# Patient Record
Sex: Female | Born: 1945 | Race: White | Hispanic: No | Smoking: Never smoker
Health system: Southern US, Community
[De-identification: ages and names within clinical notes are randomized; demographics above are authoritative.]

## PROBLEM LIST (undated history)

## (undated) DIAGNOSIS — Z8719 Personal history of other diseases of the digestive system: Secondary | ICD-10-CM

## (undated) DIAGNOSIS — M1990 Unspecified osteoarthritis, unspecified site: Secondary | ICD-10-CM

## (undated) DIAGNOSIS — I7300 Raynaud's syndrome without gangrene: Secondary | ICD-10-CM

## (undated) DIAGNOSIS — Z803 Family history of malignant neoplasm of breast: Secondary | ICD-10-CM

## (undated) DIAGNOSIS — Z8041 Family history of malignant neoplasm of ovary: Secondary | ICD-10-CM

## (undated) DIAGNOSIS — B0229 Other postherpetic nervous system involvement: Secondary | ICD-10-CM

## (undated) DIAGNOSIS — M7989 Other specified soft tissue disorders: Secondary | ICD-10-CM

## (undated) DIAGNOSIS — E039 Hypothyroidism, unspecified: Secondary | ICD-10-CM

## (undated) DIAGNOSIS — M3500 Sicca syndrome, unspecified: Secondary | ICD-10-CM

## (undated) DIAGNOSIS — B029 Zoster without complications: Secondary | ICD-10-CM

## (undated) DIAGNOSIS — I714 Abdominal aortic aneurysm, without rupture, unspecified: Secondary | ICD-10-CM

## (undated) DIAGNOSIS — H269 Unspecified cataract: Secondary | ICD-10-CM

## (undated) DIAGNOSIS — K219 Gastro-esophageal reflux disease without esophagitis: Secondary | ICD-10-CM

## (undated) DIAGNOSIS — G43909 Migraine, unspecified, not intractable, without status migrainosus: Secondary | ICD-10-CM

## (undated) DIAGNOSIS — R1310 Dysphagia, unspecified: Secondary | ICD-10-CM

## (undated) DIAGNOSIS — M810 Age-related osteoporosis without current pathological fracture: Secondary | ICD-10-CM

## (undated) DIAGNOSIS — T884XXA Failed or difficult intubation, initial encounter: Secondary | ICD-10-CM

## (undated) DIAGNOSIS — K449 Diaphragmatic hernia without obstruction or gangrene: Secondary | ICD-10-CM

## (undated) DIAGNOSIS — I10 Essential (primary) hypertension: Secondary | ICD-10-CM

## (undated) DIAGNOSIS — Z8489 Family history of other specified conditions: Secondary | ICD-10-CM

## (undated) DIAGNOSIS — Z801 Family history of malignant neoplasm of trachea, bronchus and lung: Secondary | ICD-10-CM

## (undated) DIAGNOSIS — E042 Nontoxic multinodular goiter: Secondary | ICD-10-CM

## (undated) DIAGNOSIS — M545 Low back pain, unspecified: Secondary | ICD-10-CM

## (undated) DIAGNOSIS — K859 Acute pancreatitis without necrosis or infection, unspecified: Secondary | ICD-10-CM

## (undated) DIAGNOSIS — D62 Acute posthemorrhagic anemia: Secondary | ICD-10-CM

## (undated) DIAGNOSIS — K2970 Gastritis, unspecified, without bleeding: Secondary | ICD-10-CM

## (undated) DIAGNOSIS — D509 Iron deficiency anemia, unspecified: Secondary | ICD-10-CM

## (undated) DIAGNOSIS — G8929 Other chronic pain: Secondary | ICD-10-CM

## (undated) DIAGNOSIS — M3320 Polymyositis, organ involvement unspecified: Secondary | ICD-10-CM

## (undated) DIAGNOSIS — E7800 Pure hypercholesterolemia, unspecified: Secondary | ICD-10-CM

## (undated) DIAGNOSIS — M25473 Effusion, unspecified ankle: Secondary | ICD-10-CM

## (undated) DIAGNOSIS — K5790 Diverticulosis of intestine, part unspecified, without perforation or abscess without bleeding: Secondary | ICD-10-CM

## (undated) DIAGNOSIS — I011 Acute rheumatic endocarditis: Secondary | ICD-10-CM

## (undated) DIAGNOSIS — G7241 Inclusion body myositis [IBM]: Secondary | ICD-10-CM

## (undated) HISTORY — DX: Essential (primary) hypertension: I10

## (undated) HISTORY — DX: Diaphragmatic hernia without obstruction or gangrene: K44.9

## (undated) HISTORY — DX: Unspecified cataract: H26.9

## (undated) HISTORY — DX: Raynaud's syndrome without gangrene: I73.00

## (undated) HISTORY — DX: Sjogren syndrome, unspecified: M35.00

## (undated) HISTORY — PX: ESOPHAGOGASTRODUODENOSCOPY: SHX1529

## (undated) HISTORY — DX: Family history of malignant neoplasm of breast: Z80.3

## (undated) HISTORY — DX: Age-related osteoporosis without current pathological fracture: M81.0

## (undated) HISTORY — PX: ESOPHAGEAL DILATION: SHX303

## (undated) HISTORY — DX: Family history of malignant neoplasm of ovary: Z80.41

## (undated) HISTORY — DX: Diverticulosis of intestine, part unspecified, without perforation or abscess without bleeding: K57.90

## (undated) HISTORY — DX: Unspecified osteoarthritis, unspecified site: M19.90

## (undated) HISTORY — PX: CATARACT EXTRACTION W/ INTRAOCULAR LENS  IMPLANT, BILATERAL: SHX1307

## (undated) HISTORY — DX: Abdominal aortic aneurysm, without rupture: I71.4

## (undated) HISTORY — DX: Family history of malignant neoplasm of trachea, bronchus and lung: Z80.1

## (undated) HISTORY — DX: Zoster without complications: B02.9

## (undated) HISTORY — DX: Gastritis, unspecified, without bleeding: K29.70

## (undated) HISTORY — DX: Other specified soft tissue disorders: M79.89

## (undated) HISTORY — DX: Acute posthemorrhagic anemia: D62

## (undated) HISTORY — DX: Abdominal aortic aneurysm, without rupture, unspecified: I71.40

## (undated) HISTORY — DX: Gastro-esophageal reflux disease without esophagitis: K21.9

## (undated) HISTORY — DX: Polymyositis, organ involvement unspecified: M33.20

## (undated) HISTORY — DX: Iron deficiency anemia, unspecified: D50.9

## (undated) HISTORY — PX: DENTAL SURGERY: SHX609

---

## 1998-06-14 ENCOUNTER — Other Ambulatory Visit: Admission: RE | Admit: 1998-06-14 | Discharge: 1998-06-14 | Payer: Self-pay | Admitting: *Deleted

## 1999-06-03 ENCOUNTER — Other Ambulatory Visit: Admission: RE | Admit: 1999-06-03 | Discharge: 1999-06-03 | Payer: Self-pay | Admitting: *Deleted

## 1999-08-19 ENCOUNTER — Other Ambulatory Visit: Admission: RE | Admit: 1999-08-19 | Discharge: 1999-08-19 | Payer: Self-pay | Admitting: General Surgery

## 1999-10-18 ENCOUNTER — Encounter: Payer: Self-pay | Admitting: *Deleted

## 1999-10-18 ENCOUNTER — Encounter: Admission: RE | Admit: 1999-10-18 | Discharge: 1999-10-18 | Payer: Self-pay | Admitting: *Deleted

## 1999-10-31 ENCOUNTER — Encounter: Admission: RE | Admit: 1999-10-31 | Discharge: 1999-10-31 | Payer: Self-pay | Admitting: *Deleted

## 1999-10-31 ENCOUNTER — Encounter: Payer: Self-pay | Admitting: *Deleted

## 2000-07-10 ENCOUNTER — Other Ambulatory Visit: Admission: RE | Admit: 2000-07-10 | Discharge: 2000-07-10 | Payer: Self-pay | Admitting: *Deleted

## 2000-10-31 ENCOUNTER — Encounter: Payer: Self-pay | Admitting: *Deleted

## 2000-10-31 ENCOUNTER — Encounter: Admission: RE | Admit: 2000-10-31 | Discharge: 2000-10-31 | Payer: Self-pay | Admitting: *Deleted

## 2001-08-06 ENCOUNTER — Other Ambulatory Visit: Admission: RE | Admit: 2001-08-06 | Discharge: 2001-08-06 | Payer: Self-pay | Admitting: *Deleted

## 2001-09-20 ENCOUNTER — Ambulatory Visit (HOSPITAL_COMMUNITY): Admission: RE | Admit: 2001-09-20 | Discharge: 2001-09-20 | Payer: Self-pay | Admitting: *Deleted

## 2001-11-01 ENCOUNTER — Encounter: Admission: RE | Admit: 2001-11-01 | Discharge: 2001-11-01 | Payer: Self-pay | Admitting: *Deleted

## 2001-11-01 ENCOUNTER — Encounter: Payer: Self-pay | Admitting: *Deleted

## 2001-11-20 ENCOUNTER — Encounter (INDEPENDENT_AMBULATORY_CARE_PROVIDER_SITE_OTHER): Payer: Self-pay | Admitting: *Deleted

## 2002-11-03 ENCOUNTER — Encounter: Payer: Self-pay | Admitting: *Deleted

## 2002-11-03 ENCOUNTER — Encounter: Admission: RE | Admit: 2002-11-03 | Discharge: 2002-11-03 | Payer: Self-pay | Admitting: *Deleted

## 2002-11-25 ENCOUNTER — Other Ambulatory Visit: Admission: RE | Admit: 2002-11-25 | Discharge: 2002-11-25 | Payer: Self-pay | Admitting: *Deleted

## 2003-05-07 ENCOUNTER — Other Ambulatory Visit: Admission: RE | Admit: 2003-05-07 | Discharge: 2003-05-07 | Payer: Self-pay | Admitting: *Deleted

## 2003-12-04 ENCOUNTER — Encounter: Admission: RE | Admit: 2003-12-04 | Discharge: 2003-12-04 | Payer: Self-pay | Admitting: Obstetrics

## 2004-03-24 ENCOUNTER — Encounter: Admission: RE | Admit: 2004-03-24 | Discharge: 2004-03-24 | Payer: Self-pay | Admitting: Internal Medicine

## 2004-06-13 ENCOUNTER — Other Ambulatory Visit: Admission: RE | Admit: 2004-06-13 | Discharge: 2004-06-13 | Payer: Self-pay | Admitting: Obstetrics and Gynecology

## 2005-10-10 ENCOUNTER — Other Ambulatory Visit: Admission: RE | Admit: 2005-10-10 | Discharge: 2005-10-10 | Payer: Self-pay | Admitting: Obstetrics and Gynecology

## 2005-10-20 ENCOUNTER — Encounter: Admission: RE | Admit: 2005-10-20 | Discharge: 2005-10-20 | Payer: Self-pay | Admitting: Obstetrics and Gynecology

## 2006-10-31 ENCOUNTER — Encounter: Admission: RE | Admit: 2006-10-31 | Discharge: 2006-10-31 | Payer: Self-pay | Admitting: Obstetrics and Gynecology

## 2006-11-05 ENCOUNTER — Other Ambulatory Visit: Admission: RE | Admit: 2006-11-05 | Discharge: 2006-11-05 | Payer: Self-pay | Admitting: Obstetrics and Gynecology

## 2006-12-14 ENCOUNTER — Ambulatory Visit (HOSPITAL_COMMUNITY): Admission: RE | Admit: 2006-12-14 | Discharge: 2006-12-14 | Payer: Self-pay | Admitting: *Deleted

## 2006-12-14 ENCOUNTER — Encounter (INDEPENDENT_AMBULATORY_CARE_PROVIDER_SITE_OTHER): Payer: Self-pay | Admitting: *Deleted

## 2007-02-17 ENCOUNTER — Encounter: Payer: Self-pay | Admitting: Gastroenterology

## 2007-02-27 ENCOUNTER — Encounter: Admission: RE | Admit: 2007-02-27 | Discharge: 2007-02-27 | Payer: Self-pay | Admitting: Internal Medicine

## 2007-03-21 ENCOUNTER — Encounter: Admission: RE | Admit: 2007-03-21 | Discharge: 2007-03-21 | Payer: Self-pay | Admitting: *Deleted

## 2007-11-08 ENCOUNTER — Other Ambulatory Visit: Admission: RE | Admit: 2007-11-08 | Discharge: 2007-11-08 | Payer: Self-pay | Admitting: Obstetrics and Gynecology

## 2007-11-27 ENCOUNTER — Encounter: Admission: RE | Admit: 2007-11-27 | Discharge: 2007-11-27 | Payer: Self-pay | Admitting: Obstetrics and Gynecology

## 2008-04-21 ENCOUNTER — Encounter (INDEPENDENT_AMBULATORY_CARE_PROVIDER_SITE_OTHER): Payer: Self-pay | Admitting: *Deleted

## 2008-04-21 ENCOUNTER — Ambulatory Visit (HOSPITAL_COMMUNITY): Admission: RE | Admit: 2008-04-21 | Discharge: 2008-04-21 | Payer: Self-pay | Admitting: *Deleted

## 2008-11-10 ENCOUNTER — Ambulatory Visit: Payer: Self-pay | Admitting: Obstetrics and Gynecology

## 2008-11-30 ENCOUNTER — Encounter: Admission: RE | Admit: 2008-11-30 | Discharge: 2008-11-30 | Payer: Self-pay | Admitting: Obstetrics and Gynecology

## 2009-02-11 ENCOUNTER — Encounter (INDEPENDENT_AMBULATORY_CARE_PROVIDER_SITE_OTHER): Payer: Self-pay | Admitting: *Deleted

## 2009-02-11 ENCOUNTER — Ambulatory Visit (HOSPITAL_COMMUNITY): Admission: RE | Admit: 2009-02-11 | Discharge: 2009-02-11 | Payer: Self-pay | Admitting: *Deleted

## 2009-09-02 ENCOUNTER — Ambulatory Visit (HOSPITAL_COMMUNITY): Admission: RE | Admit: 2009-09-02 | Discharge: 2009-09-02 | Payer: Self-pay | Admitting: Internal Medicine

## 2009-10-21 ENCOUNTER — Encounter: Payer: Self-pay | Admitting: Gastroenterology

## 2009-10-26 ENCOUNTER — Ambulatory Visit (HOSPITAL_COMMUNITY): Admission: RE | Admit: 2009-10-26 | Discharge: 2009-10-26 | Payer: Self-pay | Admitting: Endocrinology

## 2009-11-05 ENCOUNTER — Encounter: Payer: Self-pay | Admitting: Gastroenterology

## 2009-11-30 ENCOUNTER — Ambulatory Visit: Payer: Self-pay | Admitting: Oncology

## 2009-12-14 ENCOUNTER — Encounter (INDEPENDENT_AMBULATORY_CARE_PROVIDER_SITE_OTHER): Payer: Self-pay | Admitting: *Deleted

## 2009-12-16 LAB — MORPHOLOGY: PLT EST: ADEQUATE

## 2009-12-16 LAB — CBC & DIFF AND RETIC
BASO%: 1.1 % (ref 0.0–2.0)
EOS%: 7 % (ref 0.0–7.0)
LYMPH%: 25.4 % (ref 14.0–49.7)
MCHC: 32.1 g/dL (ref 31.5–36.0)
MCV: 79.1 fL — ABNORMAL LOW (ref 79.5–101.0)
MONO%: 13.3 % (ref 0.0–14.0)
Platelets: 291 10*3/uL (ref 145–400)
RBC: 3.78 10*6/uL (ref 3.70–5.45)
RDW: 15.9 % — ABNORMAL HIGH (ref 11.2–14.5)
Retic %: 0.61 % (ref 0.50–1.50)
nRBC: 0 % (ref 0–0)

## 2009-12-20 LAB — COMPREHENSIVE METABOLIC PANEL
ALT: 24 U/L (ref 0–35)
BUN: 14 mg/dL (ref 6–23)
CO2: 23 mEq/L (ref 19–32)
Calcium: 9.3 mg/dL (ref 8.4–10.5)
Chloride: 99 mEq/L (ref 96–112)
Creatinine, Ser: 0.72 mg/dL (ref 0.40–1.20)
Glucose, Bld: 84 mg/dL (ref 70–99)

## 2009-12-20 LAB — IRON AND TIBC
%SAT: 6 % — ABNORMAL LOW (ref 20–55)
Iron: 22 ug/dL — ABNORMAL LOW (ref 42–145)
TIBC: 377 ug/dL (ref 250–470)
UIBC: 355 ug/dL

## 2009-12-20 LAB — TRANSFERRIN RECEPTOR, SOLUABLE: Transferrin Receptor, Soluble: 31.4 nmol/L

## 2009-12-21 LAB — IMMUNOFIXATION ELECTROPHORESIS
IgA: 240 mg/dL (ref 68–378)
IgG (Immunoglobin G), Serum: 1100 mg/dL (ref 694–1618)
IgM, Serum: 65 mg/dL (ref 60–263)

## 2009-12-29 ENCOUNTER — Encounter: Payer: Self-pay | Admitting: Gastroenterology

## 2010-01-12 ENCOUNTER — Ambulatory Visit: Payer: Self-pay | Admitting: Oncology

## 2010-01-12 ENCOUNTER — Ambulatory Visit: Payer: Self-pay | Admitting: Gastroenterology

## 2010-01-12 DIAGNOSIS — R1310 Dysphagia, unspecified: Secondary | ICD-10-CM | POA: Insufficient documentation

## 2010-01-12 DIAGNOSIS — D509 Iron deficiency anemia, unspecified: Secondary | ICD-10-CM | POA: Insufficient documentation

## 2010-01-12 DIAGNOSIS — H402290 Chronic angle-closure glaucoma, unspecified eye, stage unspecified: Secondary | ICD-10-CM | POA: Insufficient documentation

## 2010-01-13 ENCOUNTER — Encounter: Payer: Self-pay | Admitting: Gastroenterology

## 2010-01-14 LAB — COMPREHENSIVE METABOLIC PANEL
ALT: 25 U/L (ref 0–35)
AST: 31 U/L (ref 0–37)
Albumin: 3.6 g/dL (ref 3.5–5.2)
Alkaline Phosphatase: 52 U/L (ref 39–117)
BUN: 10 mg/dL (ref 6–23)
CO2: 28 mEq/L (ref 19–32)
Calcium: 9.1 mg/dL (ref 8.4–10.5)
Chloride: 100 mEq/L (ref 96–112)
Creatinine, Ser: 0.53 mg/dL (ref 0.40–1.20)
Glucose, Bld: 77 mg/dL (ref 70–99)
Potassium: 3.7 mEq/L (ref 3.5–5.3)
Sodium: 133 mEq/L — ABNORMAL LOW (ref 135–145)
Total Bilirubin: 0.3 mg/dL (ref 0.3–1.2)
Total Protein: 6.5 g/dL (ref 6.0–8.3)

## 2010-01-14 LAB — CBC WITH DIFFERENTIAL/PLATELET
BASO%: 0.7 % (ref 0.0–2.0)
Basophils Absolute: 0 10*3/uL (ref 0.0–0.1)
EOS%: 3.3 % (ref 0.0–7.0)
Eosinophils Absolute: 0.1 10*3/uL (ref 0.0–0.5)
HCT: 31.5 % — ABNORMAL LOW (ref 34.8–46.6)
HGB: 10.4 g/dL — ABNORMAL LOW (ref 11.6–15.9)
LYMPH%: 33.1 % (ref 14.0–49.7)
MCH: 27.7 pg (ref 25.1–34.0)
MCHC: 33.1 g/dL (ref 31.5–36.0)
MCV: 83.7 fL (ref 79.5–101.0)
MONO#: 0.6 10*3/uL (ref 0.1–0.9)
MONO%: 14.6 % — ABNORMAL HIGH (ref 0.0–14.0)
NEUT#: 2 10*3/uL (ref 1.5–6.5)
NEUT%: 48.3 % (ref 38.4–76.8)
Platelets: 287 10*3/uL (ref 145–400)
RBC: 3.77 10*6/uL (ref 3.70–5.45)
RDW: 21.3 % — ABNORMAL HIGH (ref 11.2–14.5)
WBC: 4.2 10*3/uL (ref 3.9–10.3)
lymph#: 1.4 10*3/uL (ref 0.9–3.3)

## 2010-01-14 LAB — LACTATE DEHYDROGENASE: LDH: 184 U/L (ref 94–250)

## 2010-01-14 LAB — SEDIMENTATION RATE: Sed Rate: 10 mm/hr (ref 0–22)

## 2010-01-19 ENCOUNTER — Other Ambulatory Visit: Admission: RE | Admit: 2010-01-19 | Discharge: 2010-01-19 | Payer: Self-pay | Admitting: Obstetrics and Gynecology

## 2010-01-19 ENCOUNTER — Ambulatory Visit: Payer: Self-pay | Admitting: Obstetrics and Gynecology

## 2010-01-21 ENCOUNTER — Encounter: Admission: RE | Admit: 2010-01-21 | Discharge: 2010-01-21 | Payer: Self-pay | Admitting: Obstetrics and Gynecology

## 2010-01-24 ENCOUNTER — Encounter: Admission: RE | Admit: 2010-01-24 | Discharge: 2010-01-24 | Payer: Self-pay | Admitting: Otolaryngology

## 2010-02-15 HISTORY — PX: REPAIR ZENKER'S DIVERTICULA: SUR1212

## 2010-02-17 ENCOUNTER — Ambulatory Visit (HOSPITAL_COMMUNITY): Admission: RE | Admit: 2010-02-17 | Discharge: 2010-02-18 | Payer: Self-pay | Admitting: Otolaryngology

## 2010-03-21 ENCOUNTER — Ambulatory Visit: Payer: Self-pay | Admitting: Oncology

## 2010-03-23 LAB — CBC WITH DIFFERENTIAL/PLATELET
BASO%: 0.3 % (ref 0.0–2.0)
Basophils Absolute: 0 10*3/uL (ref 0.0–0.1)
EOS%: 0.5 % (ref 0.0–7.0)
Eosinophils Absolute: 0 10*3/uL (ref 0.0–0.5)
HCT: 35 % (ref 34.8–46.6)
HGB: 12 g/dL (ref 11.6–15.9)
LYMPH%: 22.7 % (ref 14.0–49.7)
MCH: 30.3 pg (ref 25.1–34.0)
MCHC: 34.4 g/dL (ref 31.5–36.0)
MCV: 88.1 fL (ref 79.5–101.0)
MONO#: 0.7 10*3/uL (ref 0.1–0.9)
MONO%: 8.2 % (ref 0.0–14.0)
NEUT#: 5.8 10*3/uL (ref 1.5–6.5)
NEUT%: 68.3 % (ref 38.4–76.8)
Platelets: 355 10*3/uL (ref 145–400)
RBC: 3.97 10*6/uL (ref 3.70–5.45)
RDW: 16.1 % — ABNORMAL HIGH (ref 11.2–14.5)
WBC: 8.4 10*3/uL (ref 3.9–10.3)
lymph#: 1.9 10*3/uL (ref 0.9–3.3)

## 2010-03-24 LAB — IRON AND TIBC
%SAT: 28 % (ref 20–55)
Iron: 88 ug/dL (ref 42–145)
TIBC: 310 ug/dL (ref 250–470)
UIBC: 222 ug/dL

## 2010-03-24 LAB — TRANSFERRIN RECEPTOR, SOLUABLE: Transferrin Receptor, Soluble: 13.9 nmol/L

## 2010-03-24 LAB — FERRITIN: Ferritin: 35 ng/mL (ref 10–291)

## 2010-04-26 ENCOUNTER — Encounter: Admission: RE | Admit: 2010-04-26 | Discharge: 2010-04-26 | Payer: Self-pay | Admitting: Otolaryngology

## 2010-05-06 ENCOUNTER — Ambulatory Visit (HOSPITAL_COMMUNITY): Admission: RE | Admit: 2010-05-06 | Discharge: 2010-05-06 | Payer: Self-pay | Admitting: Endocrinology

## 2010-06-22 ENCOUNTER — Ambulatory Visit: Payer: Self-pay | Admitting: Oncology

## 2010-06-24 LAB — CBC WITH DIFFERENTIAL/PLATELET
BASO%: 0.6 % (ref 0.0–2.0)
Basophils Absolute: 0 10*3/uL (ref 0.0–0.1)
EOS%: 3.6 % (ref 0.0–7.0)
Eosinophils Absolute: 0.1 10*3/uL (ref 0.0–0.5)
HCT: 34.5 % — ABNORMAL LOW (ref 34.8–46.6)
HGB: 12 g/dL (ref 11.6–15.9)
LYMPH%: 28.3 % (ref 14.0–49.7)
MCH: 32.5 pg (ref 25.1–34.0)
MCHC: 34.8 g/dL (ref 31.5–36.0)
MCV: 93.3 fL (ref 79.5–101.0)
MONO#: 0.6 10*3/uL (ref 0.1–0.9)
MONO%: 14.9 % — ABNORMAL HIGH (ref 0.0–14.0)
NEUT#: 2.1 10*3/uL (ref 1.5–6.5)
NEUT%: 52.6 % (ref 38.4–76.8)
Platelets: 292 10*3/uL (ref 145–400)
RBC: 3.69 10*6/uL — ABNORMAL LOW (ref 3.70–5.45)
RDW: 13.1 % (ref 11.2–14.5)
WBC: 4 10*3/uL (ref 3.9–10.3)
lymph#: 1.1 10*3/uL (ref 0.9–3.3)

## 2010-06-27 LAB — COMPREHENSIVE METABOLIC PANEL
ALT: 22 U/L (ref 0–35)
AST: 26 U/L (ref 0–37)
Albumin: 3.9 g/dL (ref 3.5–5.2)
Alkaline Phosphatase: 52 U/L (ref 39–117)
BUN: 13 mg/dL (ref 6–23)
CO2: 24 mEq/L (ref 19–32)
Calcium: 9.5 mg/dL (ref 8.4–10.5)
Chloride: 97 mEq/L (ref 96–112)
Creatinine, Ser: 0.55 mg/dL (ref 0.40–1.20)
Glucose, Bld: 71 mg/dL (ref 70–99)
Potassium: 4.7 mEq/L (ref 3.5–5.3)
Sodium: 130 mEq/L — ABNORMAL LOW (ref 135–145)
Total Bilirubin: 0.3 mg/dL (ref 0.3–1.2)
Total Protein: 6.6 g/dL (ref 6.0–8.3)

## 2010-06-27 LAB — IRON AND TIBC
%SAT: 24 % (ref 20–55)
Iron: 67 ug/dL (ref 42–145)
TIBC: 278 ug/dL (ref 250–470)
UIBC: 211 ug/dL

## 2010-06-27 LAB — LACTATE DEHYDROGENASE: LDH: 204 U/L (ref 94–250)

## 2010-06-27 LAB — FERRITIN: Ferritin: 48 ng/mL (ref 10–291)

## 2010-06-27 LAB — TRANSFERRIN RECEPTOR, SOLUABLE: Transferrin Receptor, Soluble: 14.5 nmol/L

## 2010-08-02 ENCOUNTER — Ambulatory Visit: Payer: Self-pay | Admitting: Vascular Surgery

## 2010-10-21 ENCOUNTER — Ambulatory Visit: Payer: Self-pay | Admitting: Oncology

## 2010-10-25 LAB — COMPREHENSIVE METABOLIC PANEL
ALT: 22 U/L (ref 0–35)
AST: 21 U/L (ref 0–37)
Albumin: 4.4 g/dL (ref 3.5–5.2)
Alkaline Phosphatase: 49 U/L (ref 39–117)
BUN: 12 mg/dL (ref 6–23)
CO2: 25 mEq/L (ref 19–32)
Calcium: 9.8 mg/dL (ref 8.4–10.5)
Chloride: 96 mEq/L (ref 96–112)
Creatinine, Ser: 0.59 mg/dL (ref 0.40–1.20)
Glucose, Bld: 83 mg/dL (ref 70–99)
Potassium: 4.3 mEq/L (ref 3.5–5.3)
Sodium: 133 mEq/L — ABNORMAL LOW (ref 135–145)
Total Bilirubin: 0.4 mg/dL (ref 0.3–1.2)
Total Protein: 6.7 g/dL (ref 6.0–8.3)

## 2010-10-25 LAB — CBC WITH DIFFERENTIAL/PLATELET
BASO%: 0.1 % (ref 0.0–2.0)
Basophils Absolute: 0 10*3/uL (ref 0.0–0.1)
EOS%: 0.9 % (ref 0.0–7.0)
Eosinophils Absolute: 0.1 10*3/uL (ref 0.0–0.5)
HCT: 37.2 % (ref 34.8–46.6)
HGB: 13 g/dL (ref 11.6–15.9)
LYMPH%: 19.7 % (ref 14.0–49.7)
MCH: 33.1 pg (ref 25.1–34.0)
MCHC: 34.9 g/dL (ref 31.5–36.0)
MCV: 94.7 fL (ref 79.5–101.0)
MONO#: 0.8 10*3/uL (ref 0.1–0.9)
MONO%: 8.6 % (ref 0.0–14.0)
NEUT#: 6.5 10*3/uL (ref 1.5–6.5)
NEUT%: 70.7 % (ref 38.4–76.8)
Platelets: 364 10*3/uL (ref 145–400)
RBC: 3.93 10*6/uL (ref 3.70–5.45)
RDW: 13 % (ref 11.2–14.5)
WBC: 9.2 10*3/uL (ref 3.9–10.3)
lymph#: 1.8 10*3/uL (ref 0.9–3.3)

## 2010-10-25 LAB — LACTATE DEHYDROGENASE: LDH: 207 U/L (ref 94–250)

## 2010-10-25 LAB — FERRITIN: Ferritin: 100 ng/mL (ref 10–291)

## 2010-10-25 LAB — IRON AND TIBC
%SAT: 33 % (ref 20–55)
Iron: 105 ug/dL (ref 42–145)
TIBC: 322 ug/dL (ref 250–470)
UIBC: 217 ug/dL

## 2010-11-02 ENCOUNTER — Encounter: Admission: RE | Admit: 2010-11-02 | Discharge: 2010-11-02 | Payer: Self-pay | Admitting: Internal Medicine

## 2011-01-07 ENCOUNTER — Other Ambulatory Visit: Payer: Self-pay | Admitting: Obstetrics and Gynecology

## 2011-01-07 DIAGNOSIS — Z1239 Encounter for other screening for malignant neoplasm of breast: Secondary | ICD-10-CM

## 2011-01-07 DIAGNOSIS — Z1231 Encounter for screening mammogram for malignant neoplasm of breast: Secondary | ICD-10-CM

## 2011-01-17 NOTE — Op Note (Signed)
Summary: EGD W/Dilation  (Dr Virginia Rochester)  NAME:  Sabrina, Marshall               ACCOUNT NO.:  [id number]      MEDICAL RECORD NO.:  [id number]          PATIENT TYPE:  AMB      LOCATION:  ENDO                         FACILITY:  [facility]      PHYSICIAN:  Georgiana Spinner, M.D.    DATE OF BIRTH:  1946/03/30      DATE OF PROCEDURE:  04/21/2008   DATE OF DISCHARGE:                                  OPERATIVE REPORT      PROCEDURE:  Upper endoscopy with Savary dilation.      INDICATIONS:  Dysphagia with chronic cricopharyngeus.      ANESTHESIA:  Demerol 60, Versed 7 mg.      PROCEDURE:  In the left lateral decubitus position, the Pentax   videoscopic endoscope was inserted in the mouth, passed under direct   vision to the pharynx but I could not get it to pass through the   cricopharyngeus; therefore, withdrew this endoscope and chose a Pentax   videoscopic pediatric endoscope which I was in fact able to get passed   through the cricopharyngeus distally into the esophagus, passed into the   stomach and duodenal bulb, second portion duodenum were all visualized.   Photographs were taken.  The endoscope was pulled back into the stomach,   placed in retroflexion to view the stomach from below.  The endoscope   was then straightened very easily.  A 14 offered some resistance at the   passage, therefore I elected to pull the guidewire with removal of the   14 Savary dilator, reinserted the endoscope and it passed pretty easily   into the esophagus at this point and I was able to pass distally.  There   was minimal mild bleeding at the cricopharyngeus, but otherwise it was   an unremarkable post dilation examination.  The endoscope was withdrawn.   The patient's vital signs, pulse oximeter remained stable.  The patient   tolerated procedure well without apparent complication.      FINDINGS:  Fairly tight cricopharyngeus muscle dilated at this point to   10, 12 and 14 Savary dilation.      PLAN:  Await  clinical response and will have patient follow-up with me   as an outpatient.                  ______________________________   Georgiana Spinner, M.D.            GMO/MEDQ  D:  04/21/2008  T:  04/21/2008  Job:  [id number]

## 2011-01-17 NOTE — Op Note (Signed)
Summary: Colonoscopy  (Dr Virginia Rochester)  NAME:  [name], [name]               ACCOUNT NO.:  [id number]   MEDICAL RECORD NO.:  [id number]          PATIENT TYPE:  AMB   LOCATION:  ENDO                         FACILITY:  [facility]   PHYSICIAN:  Georgiana Spinner, M.D.    DATE OF BIRTH:  1946/03/03   DATE OF PROCEDURE:  12/14/2006  DATE OF DISCHARGE:                               OPERATIVE REPORT   PROCEDURE:  Colonoscopy.   INDICATIONS:  Colon cancer screening.   FAMILY HISTORY:  Colon polyps/colon cancer.   ANESTHESIA:  Demerol 70 mg, Versed 7 mg.   DESCRIPTION OF PROCEDURE:  With the patient mildly sedated in the left  lateral decubitus position, the Pentax videoscopic colonoscope was  inserted in the rectum and passed under direct vision with pressure  applied.  We reached the cecum identified by ileocecal valve and  appendiceal orifice, both of which were photographed.  From this point,  the colonoscope was slowly withdrawn taking circumferential views of  colonic mucosa stopping in the rectum which appeared normal on direct  and retroflexed view.  The endoscope was straightened and withdrawn.  The patient's vital signs and pulse oximeter remained stable.  The  patient tolerated the procedure well without apparent complications.   FINDINGS:  Negative examination.   PLAN:  Repeat examination 5 years           ______________________________  Georgiana Spinner, M.D.     GMO/MEDQ  D:  12/14/2006  T:  12/14/2006  Job:  [id number]

## 2011-01-17 NOTE — Assessment & Plan Note (Signed)
Summary: anemia...as.   History of Present Illness Visit Type: Initial Consult Primary GI MD: Melvia Heaps MD [facility] Primary Provider: Merri Brunette, MD Requesting Provider: Merri Brunette, MD Chief Complaint: dysphagia, has had dilations in the past History of Present Illness:   [name] is a pleasant 65 year-old white female referred at the request of Dr. Renne Crigler for evaluation of dysphagia.  She has a history of recurrent dysphagia that primarily occurs in the region of her neck.  She has been dilated twice in the past and was told to have a very tight proximal esophagus.  A barium swallow from 2008, which I reviewed, demonstrated a prominent cricopharyngeus.  With dilatation therapy she has had  very temporary relief of her symptoms.  Patient has a history of an eye deficiency anemia.  2007 was normal.  The patient takes Mobic daily.   GI Review of Systems    Reports acid reflux and  dysphagia with solids.      Denies abdominal pain, belching, bloating, chest pain, heartburn, loss of appetite, nausea, vomiting, vomiting blood, weight loss, and  weight gain.        Denies anal fissure, black tarry stools, change in bowel habit, constipation, diarrhea, diverticulosis, fecal incontinence, heme positive stool, hemorrhoids, irritable bowel syndrome, jaundice, light color stool, liver problems, rectal bleeding, and  rectal pain. Preventive Screening-Counseling & Management  Alcohol-Tobacco     Smoking Status: never      Drug Use:  no.      Current Medications (verified): 1)  Plaquenil 200 Mg Tabs (Hydroxychloroquine Sulfate) .... Two Times A Day 2)  Mobic 7.5 Mg Tabs (Meloxicam) .... Take 1 - 2 Times Daily 3)  Timolol Maleate 0.5 % Soln (Timolol Maleate) .Marland Kitchen.. 1 Dro in Both Eyes At Bedtime 4)  Atacand 16 Mg Tabs (Candesartan Cilexetil) .... Once Daily 5)  Doxycycline Hyclate 100 Mg Caps (Doxycycline Hyclate) .... Once Daily 6)  Welchol 3.75 Gm Pack (Colesevelam Hcl) .... Once Daily 7)   Calcium 1000mg  .... Once Daily 8)  Multivitamins  Tabs (Multiple Vitamin) .... Once Daily 9)  Vitamin D3 1000 Unit Tabs (Cholecalciferol) .... Once Daily 10)  Aspir-Low 81 Mg Tbec (Aspirin) .... Once Daily 11)  Prilosec Otc 20 Mg Tbec (Omeprazole Magnesium) .... Every Morning 12)  Ferrous Sulfate 325 (65 Fe) Mg Tbec (Ferrous Sulfate) .... Once Daily  Allergies (verified): No Known Drug Allergies  Past History:  Past Medical History: Esophageal Stricture Anemia Arthritis Glaucoma Hyperlipidemia Hypertension  Past Surgical History: Unremarkable  Family History: Family History of Breast Cancer:Father Family History of Colon Cancer:Father Family History of Ovarian Cancer:Mother Family History of Colon Polyps:Brother Family History of Diabetes: Father Family History of Heart Disease: Father  Social History: Occupation: Advertising account executive Patient has never smoked.  Alcohol Use - no Daily Caffeine Use Illicit Drug Use - no Smoking Status:  never Drug Use:  no  Review of Systems       The patient complains of arthritis/joint pain and back pain.         All other systems were reviewed and were negative   Vital Signs:  Patient profile:   65 year old female Height:      61 inches Weight:      123.13 pounds BMI:     23.35 Pulse rate:   84 / minute Pulse rhythm:   regular BP sitting:   144 / 88  (left arm) Cuff size:   regular  Vitals Entered By: June McMurray CMA (AAMA) (  January 12, 2010 8:25 AM)  Physical Exam  Additional Exam:  She is a well-developed alert female  skin: anicteric HEENT: normocephalic; PEERLA; no nasal or pharyngeal abnormalities neck: supple nodes: no cervical lymphadenopathy chest: clear to ausculatation and percussion heart: no murmurs, gallops, or rubs abd: soft, nontender; BS normoactive; no abdominal masses, tenderness, organomegaly rectal: deferred ext: no cynanosis, clubbing, edema skeletal: no deformities neuro: oriented x 3; no  focal abnormalities    Impression & Recommendations:  Problem # 1:  DYSPHAGIA UNSPECIFIED (ICD-787.20) 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  Recommendations #1 ENT evaluation for consideration of sphincterotomy or Botox therapy  Problem # 2:  IRON DEFICIENCY (ICD-280.9) this may be related to her chronic NSAID therapy.  Recommendations #1 check Hemoccults; patient currently had that done at Dr. Carolee Rota office;   Problem # 3:  CHRONIC ANGLE-CLOSURE GLAUCOMA [phone])  Patient Instructions: 1)  cc Dr. Merri Brunette 2)  cc Dr. Allegra Grana  Appended Document: Orders Update    Clinical Lists Changes  Orders: Added new Referral order of ENT Referral (ENT) - Signed

## 2011-01-17 NOTE — Op Note (Signed)
Summary: EGD with Dilation (Dr Virginia Rochester)  NAME:  [name], [name]               ACCOUNT NO.:  [id number]      MEDICAL RECORD NO.:  [id number]          PATIENT TYPE:  AMB      LOCATION:  ENDO                         FACILITY:  [facility]      PHYSICIAN:  Georgiana Spinner, M.D.    DATE OF BIRTH:  [birth year]      DATE OF PROCEDURE:   DATE OF DISCHARGE:                                  OPERATIVE REPORT      PROCEDURE:  Upper endoscopy with Savary dilation.      INDICATIONS:  Proximal dysphagia.      ANESTHESIA:  Fentanyl 75 mcg, Versed 6 mg.      PROCEDURE:  With the patient mildly sedated in the left lateral   decubitus position the Pentax videoscopic endoscope was inserted in the   mouth and we tried twice to pass it under direct vision, but could not   get it to pass proximally so therefore I switched out to a Pentax   pediatric endoscope which I was able to advance easily into the   esophagus and subsequently distally into the stomach and duodenal bulb.   From this point, the endoscope was then slowly withdrawn, taking   circumferential views of duodenal mucosa until the endoscope had been   pulled back into the stomach, placed in retroflexion to view the stomach   from below and then a guidewire was passed.  The endoscope was   withdrawn, taking circumferential views of the gastric and esophageal   mucosa.  Subsequently, Savary dilators 14 was passed rather easily, 15   was passed with some pressure and with the latter I elected to then   remove the guidewire.  The endoscope was reinserted.  There was a small   amount of blood in the proximal esophagus, but no untoward tear was   noted.  The endoscope was withdrawn.  The patient's vital signs and   pulse oximeter remained stable.  The patient tolerated the procedure   well without apparent complications.      FINDINGS:  Proximal esophageal narrowing, dilated to 14 and 15 Savary at   this point.      PLAN:  Await clinical response and the  patient will follow-up with me as   an outpatient and to call me as needed.                  ______________________________   Georgiana Spinner, M.D.            GMO/MEDQ  D:  02/11/2009  T:  02/11/2009  Job:  [id number]

## 2011-01-17 NOTE — Letter (Signed)
Summary: Results Letter  [facility]  [address] [place], [place] [address]   Phone: [phone]  Fax: [phone]        January 12, 2010 MRN: [id number]    [facility] [address] [place], [place]  [address]    Dear Ms. REMMEL,  It is my pleasure to have treated you recently as a new patient in my office. I appreciate your confidence and the opportunity to participate in your care.  Since I do have a busy inpatient endoscopy schedule and office schedule, my office hours vary weekly. I am, however, available for emergency calls everyday through my office. If I am not available for an urgent office appointment, another one of our gastroenterologist will be able to assist you.  My well-trained staff are prepared to help you at all times. For emergencies after office hours, a physician from our Gastroenterology section is always available through my 24 hour answering service  Once again I welcome you as a new patient and I look forward to a happy and healthy relationship            Sincerely,  Louis Meckel MD  This letter has been electronically signed by your physician.  Appended Document: Results Letter letter mailed

## 2011-01-17 NOTE — Letter (Signed)
Summary: [facility]   Imported By: Sherian Rein 01/17/2010 10:14:49  _____________________________________________________________________  External Attachment:    Type:   Image     Comment:   External Document

## 2011-01-17 NOTE — Op Note (Signed)
Summary: Colonoscopy (Dr Virginia Rochester)   [facility]  Patient:    [name], [name] Visit Number: [id number] MRN: [id number]          Service Type: END Location: ENDO Attending Physician:  Sabino Gasser Dictated by:   Sabino Gasser, M.D. Proc. Date: 09/20/01 Admit Date:  09/20/2001                             Procedure Report  PROCEDURE:  Colonoscopy.  SURGEON:  Sabino Gasser, M.D.  INDICATIONS:  Colon cancer screening and family history of colon cancer.  ANESTHESIA:  Demerol 70 and Versed 6 mg.  DESCRIPTION OF PROCEDURE:  With the patient mildly sedated in the left lateral decubitus position, the Olympus videoscopic colonoscope was inserted in the rectum and passed under direct vision to the cecum through a very tortuous colon.  The cecum identified by the ileocecal valve and appendiceal orifice, both of which were photographed.  From this point, the colonoscope was slowly withdrawn, taking circumferential views of the entire colonic mucosa, stopping only in the rectum which appeared normal on direct view and showed small hemorrhoids on retroflexed view and pulled through the anal canal.  The patients vital signs and pulse oximeter remained stable.  The patient tolerated the procedure well and without apparent complications.  FINDINGS:  Tortuous colon with hemorrhoids noted, otherwise unremarkable examination.  The patient is to follow up with me in five years or as needed. Dictated by:   Sabino Gasser, M.D. Attending Physician:  Sabino Gasser DD:  09/20/01 TD:  09/20/01 Job: [id number]

## 2011-01-17 NOTE — Letter (Signed)
Summary: [facility]  [facility]   Imported By: Lennie Odor 01/21/2010 10:55:28  _____________________________________________________________________  External Attachment:    Type:   Image     Comment:   External Document

## 2011-01-23 ENCOUNTER — Ambulatory Visit
Admission: RE | Admit: 2011-01-23 | Discharge: 2011-01-23 | Disposition: A | Discharge status: Home / Self Care | Payer: BC Managed Care – PPO | Source: Ambulatory Visit | Attending: Obstetrics and Gynecology | Admitting: Obstetrics and Gynecology

## 2011-01-23 DIAGNOSIS — Z1231 Encounter for screening mammogram for malignant neoplasm of breast: Secondary | ICD-10-CM

## 2011-02-15 ENCOUNTER — Encounter (INDEPENDENT_AMBULATORY_CARE_PROVIDER_SITE_OTHER): Payer: BC Managed Care – PPO | Admitting: Obstetrics and Gynecology

## 2011-02-15 ENCOUNTER — Other Ambulatory Visit (HOSPITAL_COMMUNITY)
Admission: RE | Admit: 2011-02-15 | Discharge: 2011-02-15 | Disposition: A | Discharge status: Home / Self Care | Payer: BC Managed Care – PPO | Source: Ambulatory Visit | Attending: Obstetrics and Gynecology | Admitting: Obstetrics and Gynecology

## 2011-02-15 ENCOUNTER — Other Ambulatory Visit: Payer: Self-pay | Admitting: Obstetrics and Gynecology

## 2011-02-15 DIAGNOSIS — Z124 Encounter for screening for malignant neoplasm of cervix: Secondary | ICD-10-CM | POA: Insufficient documentation

## 2011-02-15 DIAGNOSIS — Z01419 Encounter for gynecological examination (general) (routine) without abnormal findings: Secondary | ICD-10-CM

## 2011-02-20 ENCOUNTER — Other Ambulatory Visit (HOSPITAL_COMMUNITY): Payer: Self-pay | Admitting: Oncology

## 2011-02-20 ENCOUNTER — Encounter (HOSPITAL_BASED_OUTPATIENT_CLINIC_OR_DEPARTMENT_OTHER): Payer: BC Managed Care – PPO | Admitting: Oncology

## 2011-02-20 DIAGNOSIS — R700 Elevated erythrocyte sedimentation rate: Secondary | ICD-10-CM

## 2011-02-20 DIAGNOSIS — D508 Other iron deficiency anemias: Secondary | ICD-10-CM

## 2011-02-20 LAB — CBC WITH DIFFERENTIAL/PLATELET
BASO%: 0.3 % (ref 0.0–2.0)
EOS%: 1.8 % (ref 0.0–7.0)
HCT: 36.3 % (ref 34.8–46.6)
MCH: 31.6 pg (ref 25.1–34.0)
MCHC: 33.6 g/dL (ref 31.5–36.0)
NEUT%: 64.7 % (ref 38.4–76.8)
lymph#: 1.7 10*3/uL (ref 0.9–3.3)

## 2011-02-20 LAB — IRON AND TIBC
%SAT: 42 % (ref 20–55)
TIBC: 297 ug/dL (ref 250–470)

## 2011-02-20 LAB — COMPREHENSIVE METABOLIC PANEL
ALT: 21 U/L (ref 0–35)
AST: 19 U/L (ref 0–37)
Chloride: 102 mEq/L (ref 96–112)
Creatinine, Ser: 0.62 mg/dL (ref 0.40–1.20)
Total Bilirubin: 0.4 mg/dL (ref 0.3–1.2)

## 2011-02-20 LAB — FERRITIN: Ferritin: 100 ng/mL (ref 10–291)

## 2011-03-06 LAB — ACTH: C206 ACTH: 7 pg/mL — ABNORMAL LOW (ref 10–46)

## 2011-03-06 LAB — ACTH STIMULATION, 3 TIME POINTS
Cortisol, 30 Min: 16.4 ug/dL (ref >20)
Cortisol, 60 Min: 17.1 ug/dL (ref >20)
Cortisol, Base: 7.4 ug/dL

## 2011-03-09 LAB — CBC
Hemoglobin: 12.4 g/dL (ref 12.0–15.0)
MCHC: 34.2 g/dL (ref 30.0–36.0)
RBC: 4.19 MIL/uL (ref 3.87–5.11)
WBC: 5.4 10*3/uL (ref 4.0–10.5)

## 2011-03-09 LAB — BASIC METABOLIC PANEL
Calcium: 9.7 mg/dL (ref 8.4–10.5)
Creatinine, Ser: 0.46 mg/dL (ref 0.4–1.2)
GFR calc Af Amer: >60 mL/min (ref >60)
GFR calc non Af Amer: >60 mL/min (ref >60)
Sodium: 128 mEq/L — ABNORMAL LOW (ref 135–145)

## 2011-03-13 LAB — POCT I-STAT 4, (NA,K, GLUC, HGB,HCT)
Glucose, Bld: 84 mg/dL (ref 70–99)
HCT: 40 % (ref 36.0–46.0)
Hemoglobin: 13.6 g/dL (ref 12.0–15.0)
Potassium: 3.6 mEq/L (ref 3.5–5.1)
Sodium: 136 mEq/L (ref 135–145)

## 2011-03-22 LAB — ACTH STIMULATION, 3 TIME POINTS: Cortisol, Base: 8.4 ug/dL

## 2011-03-24 LAB — ACTH STIMULATION, 3 TIME POINTS: Cortisol, Base: 3.9 ug/dL

## 2011-03-24 LAB — BASIC METABOLIC PANEL
CO2: 25 mEq/L (ref 19–32)
Calcium: 9.2 mg/dL (ref 8.4–10.5)
GFR calc Af Amer: >60 mL/min (ref >60)
GFR calc non Af Amer: >60 mL/min (ref >60)
Glucose, Bld: 88 mg/dL (ref 70–99)
Potassium: 4.1 mEq/L (ref 3.5–5.1)
Sodium: 131 mEq/L — ABNORMAL LOW (ref 135–145)

## 2011-05-02 NOTE — Procedures (Signed)
DUPLEX DEEP VENOUS EXAM - LOWER EXTREMITY   INDICATION:  Left leg pain.   HISTORY:  Edema:  No.  Trauma/Surgery:  No.  Pain:  Yes.  PE:  No.  Previous DVT:  No.  Anticoagulants:  No.  Other:  No.   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    _____________________________  Janetta Hora. Fields, MD   CB/MEDQ  D:  08/02/2010  T:  08/02/2010  Job:  [id number]

## 2011-05-02 NOTE — Op Note (Signed)
NAMEDAFFNEY, [name]               ACCOUNT NO.:  [id number]   MEDICAL RECORD NO.:  [id number]          PATIENT TYPE:  AMB   LOCATION:  ENDO                         FACILITY:  [facility]   PHYSICIAN:  Georgiana Spinner, M.D.    DATE OF BIRTH:  [birth year]   DATE OF PROCEDURE:  DATE OF DISCHARGE:                               OPERATIVE REPORT   PROCEDURE:  Upper endoscopy with Savary dilation.   INDICATIONS:  Proximal dysphagia.   ANESTHESIA:  Fentanyl 75 mcg, Versed 6 mg.   PROCEDURE:  With the patient mildly sedated in the left lateral  decubitus position the Pentax videoscopic endoscope was inserted in the  mouth and we tried twice to pass it under direct vision, but could not  get it to pass proximally so therefore I switched out to a Pentax  pediatric endoscope which I was able to advance easily into the  esophagus and subsequently distally into the stomach and duodenal bulb.  From this point, the endoscope was then slowly withdrawn, taking  circumferential views of duodenal mucosa until the endoscope had been  pulled back into the stomach, placed in retroflexion to view the stomach  from below and then a guidewire was passed.  The endoscope was  withdrawn, taking circumferential views of the gastric and esophageal  mucosa.  Subsequently, Savary dilators 14 was passed rather easily, 15  was passed with some pressure and with the latter I elected to then  remove the guidewire.  The endoscope was reinserted.  There was a small  amount of blood in the proximal esophagus, but no untoward tear was  noted.  The endoscope was withdrawn.  The patient's vital signs and  pulse oximeter remained stable.  The patient tolerated the procedure  well without apparent complications.   FINDINGS:  Proximal esophageal narrowing, dilated to 14 and 15 Savary at  this point.   PLAN:  Await clinical response and the patient will follow-up with me as  an outpatient and to call me as needed.     ______________________________  Georgiana Spinner, M.D.     GMO/MEDQ  D:  02/11/2009  T:  02/11/2009  Job:  [id number]

## 2011-05-02 NOTE — Op Note (Signed)
NAMENALLELY, YOST               ACCOUNT NO.:  [id number]   MEDICAL RECORD NO.:  [id number]          PATIENT TYPE:  AMB   LOCATION:  ENDO                         FACILITY:  [facility]   PHYSICIAN:  Georgiana Spinner, M.D.    DATE OF BIRTH:  1946/04/22   DATE OF PROCEDURE:  04/21/2008  DATE OF DISCHARGE:                               OPERATIVE REPORT   PROCEDURE:  Upper endoscopy with Savary dilation.   INDICATIONS:  Dysphagia with chronic cricopharyngeus.   ANESTHESIA:  Demerol 60, Versed 7 mg.   PROCEDURE:  In the left lateral decubitus position, the Pentax  videoscopic endoscope was inserted in the mouth, passed under direct  vision to the pharynx but I could not get it to pass through the  cricopharyngeus; therefore, withdrew this endoscope and chose a Pentax  videoscopic pediatric endoscope which I was in fact able to get passed  through the cricopharyngeus distally into the esophagus, passed into the  stomach and duodenal bulb, second portion duodenum were all visualized.  Photographs were taken.  The endoscope was pulled back into the stomach,  placed in retroflexion to view the stomach from below.  The endoscope  was then straightened very easily.  A 14 offered some resistance at the  passage, therefore I elected to pull the guidewire with removal of the  14 Savary dilator, reinserted the endoscope and it passed pretty easily  into the esophagus at this point and I was able to pass distally.  There  was minimal mild bleeding at the cricopharyngeus, but otherwise it was  an unremarkable post dilation examination.  The endoscope was withdrawn.  The patient's vital signs, pulse oximeter remained stable.  The patient  tolerated procedure well without apparent complication.   FINDINGS:  Fairly tight cricopharyngeus muscle dilated at this point to  10, 12 and 14 Savary dilation.   PLAN:  Await clinical response and will have patient follow-up with me  as an outpatient.     ______________________________  Georgiana Spinner, M.D.     GMO/MEDQ  D:  04/21/2008  T:  04/21/2008  Job:  [id number]

## 2011-05-05 NOTE — Op Note (Signed)
[name], [name]               ACCOUNT NO.:  [id number]   MEDICAL RECORD NO.:  [id number]          PATIENT TYPE:  AMB   LOCATION:  ENDO                         FACILITY:  [facility]   PHYSICIAN:  Georgiana Spinner, M.D.    DATE OF BIRTH:  [birth year]   DATE OF PROCEDURE:  12/14/2006  DATE OF DISCHARGE:                               OPERATIVE REPORT   PROCEDURE:  Colonoscopy.   INDICATIONS:  Colon cancer screening.   FAMILY HISTORY:  Colon polyps/colon cancer.   ANESTHESIA:  Demerol 70 mg, Versed 7 mg.   DESCRIPTION OF PROCEDURE:  With the patient mildly sedated in the left  lateral decubitus position, the Pentax videoscopic colonoscope was  inserted in the rectum and passed under direct vision with pressure  applied.  We reached the cecum identified by ileocecal valve and  appendiceal orifice, both of which were photographed.  From this point,  the colonoscope was slowly withdrawn taking circumferential views of  colonic mucosa stopping in the rectum which appeared normal on direct  and retroflexed view.  The endoscope was straightened and withdrawn.  The patient's vital signs and pulse oximeter remained stable.  The  patient tolerated the procedure well without apparent complications.   FINDINGS:  Negative examination.   PLAN:  Repeat examination 5 years           ______________________________  Georgiana Spinner, M.D.     GMO/MEDQ  D:  12/14/2006  T:  12/14/2006  Job:  [id number]

## 2011-05-05 NOTE — Procedures (Signed)
[facility]  Patient:    [name], [name] Visit Number: [id number] MRN: [id number]          Service Type: END Location: ENDO Attending Physician:  Sabino Gasser Dictated by:   Sabino Gasser, M.D. Proc. Date: 09/20/01 Admit Date:  09/20/2001                             Procedure Report  PROCEDURE:  Colonoscopy.  SURGEON:  Sabino Gasser, M.D.  INDICATIONS:  Colon cancer screening and family history of colon cancer.  ANESTHESIA:  Demerol 70 and Versed 6 mg.  DESCRIPTION OF PROCEDURE:  With the patient mildly sedated in the left lateral decubitus position, the Olympus videoscopic colonoscope was inserted in the rectum and passed under direct vision to the cecum through a very tortuous colon.  The cecum identified by the ileocecal valve and appendiceal orifice, both of which were photographed.  From this point, the colonoscope was slowly withdrawn, taking circumferential views of the entire colonic mucosa, stopping only in the rectum which appeared normal on direct view and showed small hemorrhoids on retroflexed view and pulled through the anal canal.  The patients vital signs and pulse oximeter remained stable.  The patient tolerated the procedure well and without apparent complications.  FINDINGS:  Tortuous colon with hemorrhoids noted, otherwise unremarkable examination.  The patient is to follow up with me in five years or as needed. Dictated by:   Sabino Gasser, M.D. Attending Physician:  Sabino Gasser DD:  09/20/01 TD:  09/20/01 Job: [id number]

## 2011-08-02 ENCOUNTER — Inpatient Hospital Stay (INDEPENDENT_AMBULATORY_CARE_PROVIDER_SITE_OTHER)
Admission: RE | Admit: 2011-08-02 | Discharge: 2011-08-02 | Disposition: A | Discharge status: Home / Self Care | Payer: BC Managed Care – PPO | Source: Ambulatory Visit | Attending: Family Medicine | Admitting: Family Medicine

## 2011-08-02 DIAGNOSIS — J019 Acute sinusitis, unspecified: Secondary | ICD-10-CM

## 2011-08-24 ENCOUNTER — Other Ambulatory Visit (HOSPITAL_COMMUNITY): Payer: Self-pay | Admitting: Oncology

## 2011-08-24 ENCOUNTER — Encounter (HOSPITAL_BASED_OUTPATIENT_CLINIC_OR_DEPARTMENT_OTHER): Payer: BC Managed Care – PPO | Admitting: Oncology

## 2011-08-24 DIAGNOSIS — D649 Anemia, unspecified: Secondary | ICD-10-CM

## 2011-08-24 DIAGNOSIS — D509 Iron deficiency anemia, unspecified: Secondary | ICD-10-CM

## 2011-08-24 DIAGNOSIS — R700 Elevated erythrocyte sedimentation rate: Secondary | ICD-10-CM

## 2011-08-24 DIAGNOSIS — R7989 Other specified abnormal findings of blood chemistry: Secondary | ICD-10-CM

## 2011-08-24 LAB — CBC WITH DIFFERENTIAL/PLATELET
BASO%: 0.7 % (ref 0.0–2.0)
LYMPH%: 27 % (ref 14.0–49.7)
MCHC: 34.9 g/dL (ref 31.5–36.0)
MONO#: 0.7 10*3/uL (ref 0.1–0.9)
MONO%: 11.9 % (ref 0.0–14.0)
Platelets: 439 10*3/uL — ABNORMAL HIGH (ref 145–400)
RBC: 3.62 10*6/uL — ABNORMAL LOW (ref 3.70–5.45)
WBC: 5.7 10*3/uL (ref 3.9–10.3)

## 2011-08-24 LAB — IRON AND TIBC
%SAT: 30 % (ref 20–55)
TIBC: 234 ug/dL — ABNORMAL LOW (ref 250–470)

## 2011-08-24 LAB — COMPREHENSIVE METABOLIC PANEL
AST: 26 U/L (ref 0–37)
Alkaline Phosphatase: 93 U/L (ref 39–117)
BUN: 10 mg/dL (ref 6–23)
Creatinine, Ser: 0.56 mg/dL (ref 0.50–1.10)
Potassium: 4.3 mEq/L (ref 3.5–5.3)

## 2011-08-24 LAB — FERRITIN: Ferritin: 267 ng/mL (ref 10–291)

## 2012-01-06 ENCOUNTER — Telehealth: Payer: Self-pay | Admitting: Oncology

## 2012-01-06 NOTE — Telephone Encounter (Signed)
S/w the pt and he is aware of his march 2013 appts 

## 2012-02-07 ENCOUNTER — Other Ambulatory Visit: Payer: Self-pay | Admitting: Obstetrics and Gynecology

## 2012-02-07 DIAGNOSIS — Z1231 Encounter for screening mammogram for malignant neoplasm of breast: Secondary | ICD-10-CM

## 2012-02-29 ENCOUNTER — Other Ambulatory Visit: Payer: BC Managed Care – PPO | Admitting: Lab

## 2012-02-29 ENCOUNTER — Ambulatory Visit: Payer: BC Managed Care – PPO | Admitting: Oncology

## 2012-03-05 ENCOUNTER — Ambulatory Visit
Admission: RE | Admit: 2012-03-05 | Discharge: 2012-03-05 | Disposition: A | Discharge status: Home / Self Care | Payer: BC Managed Care – PPO | Source: Ambulatory Visit | Attending: Obstetrics and Gynecology | Admitting: Obstetrics and Gynecology

## 2012-03-05 DIAGNOSIS — Z1231 Encounter for screening mammogram for malignant neoplasm of breast: Secondary | ICD-10-CM

## 2012-03-07 ENCOUNTER — Ambulatory Visit (HOSPITAL_BASED_OUTPATIENT_CLINIC_OR_DEPARTMENT_OTHER): Payer: BC Managed Care – PPO | Admitting: Oncology

## 2012-03-07 ENCOUNTER — Encounter: Payer: Self-pay | Admitting: Oncology

## 2012-03-07 ENCOUNTER — Other Ambulatory Visit (HOSPITAL_BASED_OUTPATIENT_CLINIC_OR_DEPARTMENT_OTHER): Payer: BC Managed Care – PPO

## 2012-03-07 VITALS — BP 143/77 | HR 70 | Temp 98.9°F | Ht 61.0 in | Wt 116.3 lb

## 2012-03-07 DIAGNOSIS — D649 Anemia, unspecified: Secondary | ICD-10-CM

## 2012-03-07 DIAGNOSIS — D509 Iron deficiency anemia, unspecified: Secondary | ICD-10-CM

## 2012-03-07 DIAGNOSIS — R1310 Dysphagia, unspecified: Secondary | ICD-10-CM

## 2012-03-07 DIAGNOSIS — D508 Other iron deficiency anemias: Secondary | ICD-10-CM

## 2012-03-07 LAB — CBC WITH DIFFERENTIAL/PLATELET
Basophils Absolute: 0 10*3/uL (ref 0.0–0.1)
Eosinophils Absolute: 0.1 10*3/uL (ref 0.0–0.5)
HCT: 34 % — ABNORMAL LOW (ref 34.8–46.6)
LYMPH%: 35.9 % (ref 14.0–49.7)
MCV: 94.1 fL (ref 79.5–101.0)
MONO#: 0.5 10*3/uL (ref 0.1–0.9)
MONO%: 11.6 % (ref 0.0–14.0)
NEUT#: 2.2 10*3/uL (ref 1.5–6.5)
NEUT%: 49.4 % (ref 38.4–76.8)
Platelets: 328 10*3/uL (ref 145–400)
RBC: 3.62 10*6/uL — ABNORMAL LOW (ref 3.70–5.45)

## 2012-03-07 LAB — COMPREHENSIVE METABOLIC PANEL
Alkaline Phosphatase: 66 U/L (ref 39–117)
BUN: 8 mg/dL (ref 6–23)
CO2: 29 mEq/L (ref 19–32)
Creatinine, Ser: 0.47 mg/dL — ABNORMAL LOW (ref 0.50–1.10)
Glucose, Bld: 71 mg/dL (ref 70–99)
Sodium: 128 mEq/L — ABNORMAL LOW (ref 135–145)
Total Bilirubin: 0.3 mg/dL (ref 0.3–1.2)

## 2012-03-07 LAB — IRON AND TIBC
Iron: 65 ug/dL (ref 42–145)
TIBC: 275 ug/dL (ref 250–470)
UIBC: 210 ug/dL (ref 125–400)

## 2012-03-07 LAB — FERRITIN: Ferritin: 146 ng/mL (ref 10–291)

## 2012-03-07 NOTE — Progress Notes (Signed)
CC:   Soyla Murphy. Renne Crigler, M.D.  PROBLEM LIST: 1. Iron-deficiency anemia dating back to early 2009.  GI workup     carried out in the past apparently had been negative.  The precise     etiology of this patient's iron deficiency remains somewhat     unclear.  Her anemia and iron deficiency state has responded to     oral iron. 2. Polymyositis diagnosed around 2000. 3. Sjogren's syndrome. 4. Raynaud's phenomenon. 5. Osteoarthritis. 6. Chronic cricopharyngeus. 7. History of esophageal stricture. 8. History of Zenker's pouch diverticulum. status post surgery on     02/17/2010. 9. Hypertension. 10.Glaucoma. 11.Hyponatremia. 12.Dysphagia with weight loss over the past year starting in 2012.  MEDICATIONS: 1. Aspirin 81 mg daily. 2. Calcium carbonate 600 mg daily. 3. Celebrex 200 mg daily. 4. Ferrous sulfate 325 mg twice a day. 5. Plaquenil 200 mg twice daily. 6. Multivitamins daily. 7. Benicar 1 tablet daily. 8. Prilosec 20 mg as needed. 9. Timoptic eye drops apply daily.  HISTORY:  Verena Shawgo was seen today for followup of her iron- deficiency anemia.  The patient was most recently seen by [place] on 08/24/2011 at which time her hemoglobin was 11.8, hematocrit 33.7. Ferritin was 267 at that time, whereas back on 12/16/2009 when the patient was first seen by [place] her ferritin was 11 with an iron saturation of 6%, TIBC of 377, iron of 22.  Mrs. Mick denies any pagophagia.  She had a GI workup by Dr. Melvia Heaps in the past that apparently was negative.  Her main problems currently are some dysphagia, some dental issues, and some resulting weight loss from these difficulties.  She has lost about 5-6 pounds over the past 6 months.  She is having some difficulty with her pills.  She has cut her iron pill in half because of dysphagia.  I believe she is going to see Dr. Melvia Heaps for evaluation.  She has seen him in the past.  PHYSICAL EXAMINATION:  General Appearance:  Mrs. Hilgeman  is a woman of slight stature, quite pleasant, weighing 116.3 pounds, height 5 feet and 1 inch, and body surface area 1.51 m2.  Vital Signs:  Blood pressure 143/77.  Other vital signs are normal.  HEENT:  There is no scleral icterus.  Mouth and pharynx are benign.  There is no peripheral adenopathy palpable.  Heart:  Normal.  Lungs:  Normal.  Abdomen:  With the patient sitting, it is benign.  Extremities:  No peripheral edema or clubbing.  The patient uses a cane.  Neurologic Exam:  Grossly normal.  LABORATORY DATA:  Today, white count 4.4, ANC 2.2 hemoglobin 11.7, hematocrit 34, platelets 328,000.  MCV and MCH are normal.  Chemistries today are notable for a sodium of 128 as compared with 132 on 08/24/2011 and 135 on 02/20/2011.  BUN was 8, creatinine 0.47, albumin 3.9.  Iron studies are pending.  Ferritin on 08/24/2011 was 267.  Iron saturation 30%, iron 70, TIBC 234.  IMPRESSION AND PLAN:  Mrs. Salsberry seems to be doing fairly well on the current dose of iron.  If her ferritin continues to climb, we may want her to cut back on her ferrous sulfate to 1 a day.  As stated, she is having some troubles with her teeth and some dysphagia, which she feels accounts for her weight loss.  In talking with Mrs. Patnode, she apparently was given the option of taking  early retirement from [employer].  That may go into effect  sometime around May.  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  The patient's husband is a Education officer, environmental a [employer] near [employer].  I will plan to see Mrs. Alfred again in 6 months at which time we will check CBC, chemistries, and iron studies.    ______________________________ Samul Dada, M.D. DSM/MEDQ  D:  03/07/2012  T:  03/07/2012  Job:  [id number]

## 2012-03-07 NOTE — Progress Notes (Signed)
This office note has been dictated.  #[id number]

## 2012-03-08 ENCOUNTER — Telehealth: Payer: Self-pay | Admitting: Oncology

## 2012-03-08 NOTE — Telephone Encounter (Signed)
pt had called and lm to set up 70mo appt,made for 9/23 and advised to c/b if needed     aom

## 2012-03-12 ENCOUNTER — Other Ambulatory Visit: Payer: Self-pay | Admitting: Otolaryngology

## 2012-03-12 DIAGNOSIS — R1313 Dysphagia, pharyngeal phase: Secondary | ICD-10-CM

## 2012-03-15 ENCOUNTER — Ambulatory Visit
Admission: RE | Admit: 2012-03-15 | Discharge: 2012-03-15 | Disposition: A | Discharge status: Home / Self Care | Payer: BC Managed Care – PPO | Source: Ambulatory Visit | Attending: Otolaryngology | Admitting: Otolaryngology

## 2012-03-15 DIAGNOSIS — R1313 Dysphagia, pharyngeal phase: Secondary | ICD-10-CM

## 2012-03-28 ENCOUNTER — Telehealth: Payer: Self-pay | Admitting: Nutrition

## 2012-03-28 NOTE — Telephone Encounter (Signed)
Spoke with patient on the telephone.  She has seen an ENT who is advising her on treatment for her dysphagia.  She has also had some dental work done which has limited her oral intake.  Patient reports wt stable at ~115 pounds for the past 6 weeks.  I educated patient on strategies for increasing oral intake with easy to swallow foods.   I educated patient on adding Acupuncturist to diet if oral intake worsens or further weight loss noted.  Patient appreciative on nutrition information and follow up.  She will call with questions, concerns or further weight loss.

## 2012-04-08 ENCOUNTER — Encounter: Payer: Self-pay | Admitting: Gynecology

## 2012-04-08 DIAGNOSIS — M8580 Other specified disorders of bone density and structure, unspecified site: Secondary | ICD-10-CM | POA: Insufficient documentation

## 2012-04-10 ENCOUNTER — Other Ambulatory Visit: Payer: Self-pay | Admitting: Internal Medicine

## 2012-04-10 DIAGNOSIS — E049 Nontoxic goiter, unspecified: Secondary | ICD-10-CM

## 2012-04-12 ENCOUNTER — Ambulatory Visit
Admission: RE | Admit: 2012-04-12 | Discharge: 2012-04-12 | Disposition: A | Discharge status: Home / Self Care | Payer: BC Managed Care – PPO | Source: Ambulatory Visit | Attending: Internal Medicine | Admitting: Internal Medicine

## 2012-04-12 DIAGNOSIS — E049 Nontoxic goiter, unspecified: Secondary | ICD-10-CM

## 2012-04-16 ENCOUNTER — Encounter: Payer: Self-pay | Admitting: Obstetrics and Gynecology

## 2012-04-16 ENCOUNTER — Ambulatory Visit (INDEPENDENT_AMBULATORY_CARE_PROVIDER_SITE_OTHER): Payer: BC Managed Care – PPO | Admitting: Obstetrics and Gynecology

## 2012-04-16 VITALS — BP 138/82 | Ht 60.0 in | Wt 114.0 lb

## 2012-04-16 DIAGNOSIS — IMO0002 Reserved for concepts with insufficient information to code with codable children: Secondary | ICD-10-CM

## 2012-04-16 DIAGNOSIS — N816 Rectocele: Secondary | ICD-10-CM

## 2012-04-16 DIAGNOSIS — N814 Uterovaginal prolapse, unspecified: Secondary | ICD-10-CM | POA: Insufficient documentation

## 2012-04-16 DIAGNOSIS — Z01419 Encounter for gynecological examination (general) (routine) without abnormal findings: Secondary | ICD-10-CM

## 2012-04-16 DIAGNOSIS — N8111 Cystocele, midline: Secondary | ICD-10-CM

## 2012-04-16 NOTE — Progress Notes (Signed)
Patient came back to see me today for her annual GYN exam. She is still aware of her uterine prolapse and cystocele but not enough to require surgery. She does not have urinary incontinence. She is having no vaginal bleeding. She is having no pelvic pain. She will check to see if she is up-to-date on colonoscopies. She has a family history: Cancer and is being treated for iron deficiency anemia by Dr. Arline Asp. She does have a normal mammogram. She does her bone densities through PCP. She has osteopenia and is on drug holiday. She has had no fractures.  Physical examination: Kennon Portela present. HEENT within normal limits. Neck: Thyroid not large. No masses. Supraclavicular nodes: not enlarged. Breasts: Examined in both sitting and lying  position. No skin changes and no masses. Abdomen: Soft no guarding rebound or masses or hernia. Pelvic: External: Within normal limits. BUS: Within normal limits. Vaginal:within normal limits. Good estrogen effect. Second degree cystocele. First degree rectocele. Cervix: clean. Uterus: Normal size and shape with second-degree prolapse. Adnexa: No masses. Rectovaginal exam: Confirmatory and negative. Extremities: Within normal limits.  Assessment: #1. Uterine prolapse with cystocele and rectocele #2. Osteopenia #3. Mother with ovarian cancer  Plan: Continue yearly mammograms. Discussed ultrasound because mother's history. Patient will decide and inform. We will use prolapse and cystocele as diagnosis.

## 2012-05-06 ENCOUNTER — Encounter: Payer: Self-pay | Admitting: Gastroenterology

## 2012-06-11 ENCOUNTER — Encounter: Payer: Self-pay | Admitting: Gastroenterology

## 2012-06-11 ENCOUNTER — Ambulatory Visit (AMBULATORY_SURGERY_CENTER): Payer: BC Managed Care – PPO | Admitting: *Deleted

## 2012-06-11 VITALS — Ht 59.0 in | Wt 115.0 lb

## 2012-06-11 DIAGNOSIS — Z1211 Encounter for screening for malignant neoplasm of colon: Secondary | ICD-10-CM

## 2012-06-11 MED ORDER — MOVIPREP 100 G PO SOLR: ORAL | Status: DC

## 2012-06-25 ENCOUNTER — Encounter: Payer: Self-pay | Admitting: Gastroenterology

## 2012-06-25 ENCOUNTER — Ambulatory Visit (AMBULATORY_SURGERY_CENTER): Payer: BC Managed Care – PPO | Admitting: Gastroenterology

## 2012-06-25 VITALS — BP 148/77 | HR 78 | Temp 98.7°F | Resp 27 | Ht 59.0 in | Wt 115.0 lb

## 2012-06-25 DIAGNOSIS — K5730 Diverticulosis of large intestine without perforation or abscess without bleeding: Secondary | ICD-10-CM

## 2012-06-25 DIAGNOSIS — Z800 Family history of malignant neoplasm of digestive organs: Secondary | ICD-10-CM

## 2012-06-25 DIAGNOSIS — Z1211 Encounter for screening for malignant neoplasm of colon: Secondary | ICD-10-CM

## 2012-06-25 MED ORDER — SODIUM CHLORIDE 0.9 % IV SOLN: 500.0000 mL | INTRAVENOUS | Status: DC

## 2012-06-25 NOTE — Op Note (Signed)
[facility] [address] [place], [place]  [address]  COLONOSCOPY PROCEDURE REPORT  PATIENT:  [name], [name]  MR#:  [id number] BIRTHDATE:  09-17-1946, 65 yrs. old  GENDER:  female ENDOSCOPIST:  Barbette Hair. Arlyce Dice, MD REF. BY:  Soyla Murphy. Renne Crigler, M.D. PROCEDURE DATE:  06/25/2012 PROCEDURE:  Diagnostic Colonoscopy ASA CLASS:  Class II INDICATIONS:  Screening, family history of colon cancer father in 39's MEDICATIONS:   MAC sedation, administered by CRNA propofol 200mg IV  DESCRIPTION OF PROCEDURE:   After the risks benefits and alternatives of the procedure were thoroughly explained, informed consent was obtained.  Digital rectal exam was performed and revealed no abnormalities.   The LB [id number] endoscope was introduced through the anus and advanced to the cecum, which was identified by both the appendix and ileocecal valve, without limitations.  The quality of the prep was good, using MiraLax. The instrument was then slowly withdrawn as the colon was fully examined. <<PROCEDUREIMAGES>>  FINDINGS:  Moderate diverticulosis was found in the sigmoid colon (see image3 and image4).  This was otherwise a normal examination of the colon (see image1 and image6).   Retroflexed views in the rectum revealed no abnormalities.    The time to cecum =  1) 6.50 minutes. The scope was then withdrawn in  1) 6.50  minutes from the cecum and the procedure completed. COMPLICATIONS:  None ENDOSCOPIC IMPRESSION: 1) Moderate diverticulosis in the sigmoid colon 2) Otherwise normal examination RECOMMENDATIONS: 1) Given your significant family history of colon cancer, you should have a repeat colonoscopy in 5 years REPEAT EXAM:  In 5 year(s) for Colonoscopy.  ______________________________ Barbette Hair. Arlyce Dice, MD  CC:  n. eSIGNED:   Barbette Hair. Isabella Ida at 06/25/2012 11:31 AM  Drucie Opitz, [phone]

## 2012-06-25 NOTE — Progress Notes (Signed)
The pt tolerated the colonoscopy very well. Maw   

## 2012-06-25 NOTE — Patient Instructions (Addendum)
YOU HAD AN ENDOSCOPIC PROCEDURE TODAY AT THE [facility]: Refer to the procedure report that was given to you for any specific questions about what was found during the examination.  If the procedure report does not answer your questions, please call your gastroenterologist to clarify.  If you requested that your care partner not be given the details of your procedure findings, then the procedure report has been included in a sealed envelope for you to review at your convenience later.  YOU SHOULD EXPECT: Some feelings of bloating in the abdomen. Passage of more gas than usual.  Walking can help get rid of the air that was put into your GI tract during the procedure and reduce the bloating. If you had a lower endoscopy (such as a colonoscopy or flexible sigmoidoscopy) you may notice spotting of blood in your stool or on the toilet paper. If you underwent a bowel prep for your procedure, then you may not have a normal bowel movement for a few days.  DIET: Your first meal following the procedure should be a light meal and then it is ok to progress to your normal diet.  A half-sandwich or bowl of soup is an example of a good first meal.  Heavy or fried foods are harder to digest and may make you feel nauseous or bloated.  Likewise meals heavy in dairy and vegetables can cause extra gas to form and this can also increase the bloating.  Drink plenty of fluids but you should avoid alcoholic beverages for 24 hours.  ACTIVITY: Your care partner should take you home directly after the procedure.  You should plan to take it easy, moving slowly for the rest of the day.  You can resume normal activity the day after the procedure however you should NOT DRIVE or use heavy machinery for 24 hours (because of the sedation medicines used during the test).    SYMPTOMS TO REPORT IMMEDIATELY: A gastroenterologist can be reached at any hour.  During normal business hours, 8:30 AM to 5:00 PM Monday through Friday,  call [phone].  After hours and on weekends, please call the GI answering service at [phone] who will take a message and have the physician on call contact you.   Following lower endoscopy (colonoscopy or flexible sigmoidoscopy):  Excessive amounts of blood in the stool  Significant tenderness or worsening of abdominal pains  Swelling of the abdomen that is new, acute  Fever of 100F or higher  FOLLOW UP: If any biopsies were taken you will be contacted by phone or by letter within the next 1-3 weeks.  Call your gastroenterologist if you have not heard about the biopsies in 3 weeks.  Our staff will call the home number listed on your records the next business day following your procedure to check on you and address any questions or concerns that you may have at that time regarding the information given to you following your procedure. This is a courtesy call and so if there is no answer at the home number and we have not heard from you through the emergency physician on call, we will assume that you have returned to your regular daily activities without incident.  SIGNATURES/CONFIDENTIALITY: You and/or your care partner have signed paperwork which will be entered into your electronic medical record.  These signatures attest to the fact that that the information above on your After Visit Summary has been reviewed and is understood.  Full responsibility of the confidentiality of this   discharge information lies with you and/or your care-partner.  Resume medications. Information given on diverticulosis and high fiber diet with discharge instructions. 

## 2012-06-25 NOTE — Progress Notes (Signed)
Patient did not experience any of the following events: a burn prior to discharge; a fall within the facility; wrong site/side/patient/procedure/implant event; or a hospital transfer or hospital admission upon discharge from the facility. (G8907) Patient did not have preoperative order for IV antibiotic SSI prophylaxis. (G8918)  

## 2012-06-26 ENCOUNTER — Telehealth: Payer: Self-pay

## 2012-06-26 NOTE — Telephone Encounter (Signed)
  Follow up Call-  Call back number 06/25/2012  Post procedure Call Back phone  # [phone]  Permission to leave phone message Yes     Patient questions:  Do you have a fever, pain , or abdominal swelling? no Pain Score  0 *  Have you tolerated food without any problems? yes  Have you been able to return to your normal activities? yes  Do you have any questions about your discharge instructions: Diet   no Medications  no Follow up visit  no  Do you have questions or concerns about your Care? no  Actions: * If pain score is 4 or above: No action needed, pain <4.  Per the pt' I had a lot of stomach rolling, but feel fine now". Maw

## 2012-08-21 ENCOUNTER — Other Ambulatory Visit: Payer: Self-pay | Admitting: Medical Oncology

## 2012-09-04 ENCOUNTER — Telehealth: Payer: Self-pay

## 2012-09-04 ENCOUNTER — Telehealth: Payer: Self-pay | Admitting: Oncology

## 2012-09-04 NOTE — Telephone Encounter (Signed)
S.W. pts husband and advised of appt time change on 9.27.13....sed

## 2012-09-04 NOTE — Telephone Encounter (Signed)
Clarified w/pt that she is seeing Norina Buzzard NP. A new member of our staff.

## 2012-09-04 NOTE — Telephone Encounter (Signed)
S.W. pts husband and advised of appt time and date change on 9.27.13....sed

## 2012-09-09 ENCOUNTER — Ambulatory Visit: Payer: BC Managed Care – PPO | Admitting: Family

## 2012-09-09 ENCOUNTER — Other Ambulatory Visit: Payer: BC Managed Care – PPO | Admitting: Lab

## 2012-09-09 ENCOUNTER — Ambulatory Visit: Payer: BC Managed Care – PPO | Admitting: Oncology

## 2012-09-13 ENCOUNTER — Ambulatory Visit (HOSPITAL_BASED_OUTPATIENT_CLINIC_OR_DEPARTMENT_OTHER): Payer: BC Managed Care – PPO | Admitting: Family

## 2012-09-13 ENCOUNTER — Telehealth: Payer: Self-pay | Admitting: Oncology

## 2012-09-13 ENCOUNTER — Other Ambulatory Visit (HOSPITAL_BASED_OUTPATIENT_CLINIC_OR_DEPARTMENT_OTHER): Payer: BC Managed Care – PPO | Admitting: Lab

## 2012-09-13 ENCOUNTER — Encounter: Payer: Self-pay | Admitting: Family

## 2012-09-13 VITALS — BP 155/93 | HR 73 | Temp 97.7°F | Resp 18 | Ht 59.0 in | Wt 119.8 lb

## 2012-09-13 DIAGNOSIS — D509 Iron deficiency anemia, unspecified: Secondary | ICD-10-CM

## 2012-09-13 DIAGNOSIS — R7401 Elevation of levels of liver transaminase levels: Secondary | ICD-10-CM

## 2012-09-13 LAB — IRON AND TIBC
%SAT: 19 % — ABNORMAL LOW (ref 20–55)
TIBC: 281 ug/dL (ref 250–470)

## 2012-09-13 LAB — CBC WITH DIFFERENTIAL/PLATELET
BASO%: 0.6 % (ref 0.0–2.0)
EOS%: 2.5 % (ref 0.0–7.0)
HCT: 36.1 % (ref 34.8–46.6)
LYMPH%: 27 % (ref 14.0–49.7)
MCH: 31.9 pg (ref 25.1–34.0)
MCHC: 34.9 g/dL (ref 31.5–36.0)
MONO#: 0.7 10*3/uL (ref 0.1–0.9)
NEUT%: 56.6 % (ref 38.4–76.8)
RBC: 3.96 10*6/uL (ref 3.70–5.45)
WBC: 5.4 10*3/uL (ref 3.9–10.3)
lymph#: 1.5 10*3/uL (ref 0.9–3.3)

## 2012-09-13 LAB — COMPREHENSIVE METABOLIC PANEL (CC13)
AST: 26 U/L (ref 5–34)
Albumin: 3.7 g/dL (ref 3.5–5.0)
Alkaline Phosphatase: 74 U/L (ref 40–150)
Glucose: 80 mg/dl (ref 70–99)
Potassium: 4 mEq/L (ref 3.5–5.1)
Sodium: 130 mEq/L — ABNORMAL LOW (ref 136–145)
Total Bilirubin: 0.5 mg/dL (ref 0.20–1.20)
Total Protein: 6.6 g/dL (ref 6.4–8.3)

## 2012-09-13 LAB — FERRITIN: Ferritin: 104 ng/mL (ref 10–291)

## 2012-09-13 LAB — LACTATE DEHYDROGENASE (CC13): LDH: 230 U/L — ABNORMAL HIGH (ref 125–220)

## 2012-09-13 NOTE — Telephone Encounter (Signed)
appts made and printed for pt aom °

## 2012-09-13 NOTE — Patient Instructions (Signed)
We will call you with the results of your iron levels and let you know if you need to restart taking oral iron.

## 2012-09-13 NOTE — Progress Notes (Signed)
Patient ID: [name], female   DOB: 03/07/1946, 66 y.o.   MRN: [id number] CSN: [id number]  CC: [name]. Sabrina Crigler, MD Sabrina Heaps, MD  Problem List: [name] is a 66 y.o. Caucasian female with a problem list consisting of:  1. Iron-deficiency anemia dating back to early 2009. GI workup carried out in the past apparently had been negative. The precise  etiology of this patient's iron deficiency remains somewhat unclear. Her anemia and iron deficiency state has responded to oral iron.  2. Polymyositis diagnosed around 2000.  3. Sjogren's syndrome.  4. Raynaud's phenomenon.  5. Osteoarthritis.  6. Chronic cricopharyngeus.  7. History of esophageal stricture.  8. History of Zenker's pouch diverticulum. status post surgery on 02/17/2010.  9. Hypertension.  10.Glaucoma.  11.Hyponatremia.  12.Dysphagia with weight loss over the past year starting in 2012.  [name] was seen by Dr. Arline Asp and I today for follow up of her iron-deficiency anemia. We last saw the patient on 03/07/2012.  Back on 12/16/2009 when the patient was first seen by [place], her ferritin was 11 with an iron saturation of 6%, TIBC of 377, iron of 22.   [name] denies any current symptomatology including pagophagia, unusual bleeding, SOB, fever, chills, night sweats, N/V/D or constipation.  She had a GI workup by Dr. Melvia Marshall in the past that apparently was negative. Her main problems currently are continued dysphagia and impaired ambulation.  She has had her dental work completed, she is eating better and she is maintaining a healthy weight.  She continues to have difficulty with her pills and was cutting her iron pill in half because of dysphagia. The patient had a colonoscopy on 07/05/2012 and stopped taking the iron pills all together around the time of her colonoscopy.  The patient states that overall she is feeling well and is without any complaint today.  She is looking forward to retirement from  [employer] in November 2013.  Past Medical History: Past Medical History  Diagnosis Date  . Iron deficiency anemia   . HTN (hypertension)   . Glaucoma   . Sjogren's syndrome   . Raynaud phenomenon   . AAA (abdominal aortic aneurysm)     small  . Osteopenia   . OA (osteoarthritis)   . Polymyositis   . GERD (gastroesophageal reflux disease)     Surgical History: Past Surgical History  Procedure Date  . Cataract extraction   . Esophageal dilation   . Repair zenker's diverticula 02/2010    Current Medications: Current Outpatient Prescriptions  Medication Sig Dispense Refill  . aspirin 81 MG tablet Take 81 mg by mouth daily.      . calcium carbonate (OS-CAL) 600 MG TABS Take 600 mg by mouth daily.      . cycloSPORINE (RESTASIS) 0.05 % ophthalmic emulsion Place 1 drop into both eyes 2 (two) times daily.      Marland Kitchen doxycycline (MONODOX) 50 MG capsule Take 1 tablet by mouth Daily.      . hydroxychloroquine (PLAQUENIL) 200 MG tablet Take 200 mg by mouth 2 (two) times daily.      . Multiple Vitamin (MULTIVITAMIN) capsule Take 1 capsule by mouth daily.      Marland Kitchen olmesartan (BENICAR) 40 MG tablet Take 40 mg by mouth daily.      Marland Kitchen omeprazole (PRILOSEC) 20 MG capsule Take 20 mg by mouth 2 (two) times daily.       . Timolol Maleate (TIMOPTIC OP) Apply 1 drop to eye  daily.       . ferrous sulfate 325 (65 FE) MG tablet Take 325 mg by mouth 2 (two) times daily.        Allergies: No Known Allergies  Family History: Family History  Problem Relation Age of Onset  . Cancer Mother     ovarian  . Ovarian cancer Mother   . Cancer Father     colon and breast  . Hypertension Father   . Heart disease Father   . Breast cancer Father     Age 91  . Colon cancer Father   . Psoriasis Sister   . Sarcoidosis Sister   . Arthritis Sister     psoriatic arthritis  . Diabetes Sister   . Arthritis Maternal Uncle     rheumatiod arthritis  . Diabetes Son   . Heart disease Brother     Social  History: History  Substance Use Topics  . Smoking status: Never Smoker   . Smokeless tobacco: Never Used  . Alcohol Use: No    Review of Systems: 10 Point review of systems was completed and is negative.   Physical Exam:   Blood pressure 155/93, pulse 73, temperature 97.7 F (36.5 C), temperature source Oral, resp. rate 18, height 4\' 11"  (1.499 m), weight 119 lb 12.8 oz (54.341 kg).  General appearance: Alert, cooperative, thin frame, no apparent distress Head: Normocephalic, without obvious abnormality, atraumatic Eyes: Conjunctivae/corneas clear, PERRLA, EOMI Nose: Nares, septum and mucosa are normal, no drainage or sinus tenderness Neck: No adenopathy, supple, symmetrical, trachea midline, thyroid not enlarged, no tenderness Resp: Clear to auscultation bilaterally, diminished bibasilar breath sounds Cardio: Regular rate and rhythm, S1, S2 normal, no murmur, click, rub or gallop GI: Soft, non-tender, non-distended, hypoactive bowel sounds,  no organomegaly Extremities: Extremities normal, atraumatic, no cyanosis or edema, limited ROM UEs and LEs Skin: Bilateral UE small hematomas Lymph nodes: Cervical, supraclavicular, and axillary nodes normal Neurologic: Grossly normal, impaired ambulation and walks with a rolling walking cart   Laboratory Data: Results for orders placed in visit on 09/13/12 (from the past 48 hour(s))  CBC WITH DIFFERENTIAL     Status: Normal   Collection Time   09/13/12  2:34 PM      Component Value Range Comment   WBC 5.4  3.9 - 10.3 10e3/uL    NEUT# 3.0  1.5 - 6.5 10e3/uL    HGB 12.6  11.6 - 15.9 g/dL    HCT 40.9  81.1 - 91.4 %    Platelets 303  145 - 400 10e3/uL    MCV 91.3  79.5 - 101.0 fL    MCH 31.9  25.1 - 34.0 pg    MCHC 34.9  31.5 - 36.0 g/dL    RBC 7.82  9.56 - 2.13 10e6/uL    RDW 13.4  11.2 - 14.5 %    lymph# 1.5  0.9 - 3.3 10e3/uL    MONO# 0.7  0.1 - 0.9 10e3/uL    Eosinophils Absolute 0.1  0.0 - 0.5 10e3/uL    Basophils Absolute 0.0   0.0 - 0.1 10e3/uL    NEUT% 56.6  38.4 - 76.8 %    LYMPH% 27.0  14.0 - 49.7 %    MONO% 13.3  0.0 - 14.0 %    EOS% 2.5  0.0 - 7.0 %    BASO% 0.6  0.0 - 2.0 %   LACTATE DEHYDROGENASE (CC13)     Status: Abnormal   Collection Time   09/13/12  2:34  PM      Component Value Range Comment   LDH 230 (*) 125 - 220 U/L   COMPREHENSIVE METABOLIC PANEL (CC13)     Status: Abnormal   Collection Time   09/13/12  2:34 PM      Component Value Range Comment   Sodium 130 (*) 136 - 145 mEq/L    Potassium 4.0  3.5 - 5.1 mEq/L    Chloride 97 (*) 98 - 107 mEq/L    CO2 25  22 - 29 mEq/L    Glucose 80  70 - 99 mg/dl    BUN 9.0  7.0 - 16.1 mg/dL    Creatinine 0.6  0.6 - 1.1 mg/dL    Total Bilirubin 0.96  0.20 - 1.20 mg/dL    Alkaline Phosphatase 74  40 - 150 U/L    AST 26  5 - 34 U/L    ALT 22  0 - 55 U/L    Total Protein 6.6  6.4 - 8.3 g/dL    Albumin 3.7  3.5 - 5.0 g/dL    Calcium 9.7  8.4 - 04.5 mg/dL      Imaging Studies: 1.  03/05/2012  Digital Screening Bilateral Mammogram showed no mammographic evidence of malignancy 2.  03/15/2012  Esophogram/Barium Swallow showed aspiration with coughing with the initial swallow barium. This was  a small amount of aspiration. No additional aspiration was noted during the course the study. Tight cricopharyngeus muscle, however improved from the preoperative study of 01/24/2010. The lumen is slightly smaller than on the preoperative study of 04/26/2010. Negative for Zenker's diverticulum. 3.  04/12/2012  US Soft Tissue Head/Neck/Thyroid US showed overall thyroid size is within normal limits.   Subcentimeter hypoechoic nodules, more numerous on the right. Clinical significance is doubtful.    Impression/Plan: [name] seems to be doing fairly well.  Iron studies are pending.  Once iron studies are received, a decision will be made at that time if the patient is to restart oral ferrous sulfate or not.  The patient's LDH is elevated and we will recheck this  laboratory in early November as the patient will be on vacation in 1 month.  [name] will be taking early retirement from [employer] in November 2013.  She had talked previously about the business her 4 sons started - [employer]. The patient's husband is a Education officer, environmental a [employer] near [employer].  We plan to see [name] again in 6 months at which time we will check CBC, chemistries, and iron studies.  The patient will contact us in the interim if she has any questions or concerns.   Larina Bras, NP-C 09/13/2012, 3:48 PM

## 2012-09-16 ENCOUNTER — Other Ambulatory Visit: Payer: Self-pay | Admitting: Family

## 2012-09-16 ENCOUNTER — Telehealth: Payer: Self-pay | Admitting: Family

## 2012-09-16 DIAGNOSIS — D509 Iron deficiency anemia, unspecified: Secondary | ICD-10-CM

## 2012-09-16 NOTE — Telephone Encounter (Signed)
Left a message for Mrs. [name] at her home and work numbers to return my call regarding lab results.  Mrs. [name] returned my call and we reviewed her lab results, specifically her low ferritin and iron levels.  Discussed with her to restart taking Ferrous Sulfate 325 mg PO daily again.  Also discussed with her that we would like to recheck her labs (Ferritin and CBC) in 3 months (December 2013).  The patient verbalized agreement and understanding.

## 2012-09-17 ENCOUNTER — Telehealth: Payer: Self-pay | Admitting: Oncology

## 2012-09-17 NOTE — Telephone Encounter (Signed)
S.W. PT AND ADVISED ON DEC APPT.

## 2012-10-22 ENCOUNTER — Other Ambulatory Visit (HOSPITAL_BASED_OUTPATIENT_CLINIC_OR_DEPARTMENT_OTHER): Payer: BC Managed Care – PPO

## 2012-10-22 DIAGNOSIS — D509 Iron deficiency anemia, unspecified: Secondary | ICD-10-CM

## 2012-10-22 LAB — LACTATE DEHYDROGENASE (CC13): LDH: 238 U/L — ABNORMAL HIGH (ref 125–220)

## 2012-12-06 ENCOUNTER — Other Ambulatory Visit (HOSPITAL_BASED_OUTPATIENT_CLINIC_OR_DEPARTMENT_OTHER): Payer: Medicare Other | Admitting: Lab

## 2012-12-06 DIAGNOSIS — D509 Iron deficiency anemia, unspecified: Secondary | ICD-10-CM

## 2012-12-06 LAB — CBC WITH DIFFERENTIAL/PLATELET
Basophils Absolute: 0 10*3/uL (ref 0.0–0.1)
HCT: 35.1 % (ref 34.8–46.6)
HGB: 12 g/dL (ref 11.6–15.9)
MONO#: 0.7 10*3/uL (ref 0.1–0.9)
NEUT#: 2.8 10*3/uL (ref 1.5–6.5)
NEUT%: 54.3 % (ref 38.4–76.8)
RDW: 13.2 % (ref 11.2–14.5)
WBC: 5.1 10*3/uL (ref 3.9–10.3)
lymph#: 1.5 10*3/uL (ref 0.9–3.3)

## 2013-02-01 ENCOUNTER — Other Ambulatory Visit: Payer: Self-pay

## 2013-03-14 ENCOUNTER — Ambulatory Visit (HOSPITAL_BASED_OUTPATIENT_CLINIC_OR_DEPARTMENT_OTHER): Payer: Medicare Other | Admitting: Oncology

## 2013-03-14 ENCOUNTER — Other Ambulatory Visit (HOSPITAL_BASED_OUTPATIENT_CLINIC_OR_DEPARTMENT_OTHER): Payer: Medicare Other | Admitting: Lab

## 2013-03-14 ENCOUNTER — Encounter: Payer: Self-pay | Admitting: Oncology

## 2013-03-14 VITALS — BP 143/80 | HR 62 | Temp 97.0°F | Resp 18 | Ht 59.0 in | Wt 114.7 lb

## 2013-03-14 DIAGNOSIS — D509 Iron deficiency anemia, unspecified: Secondary | ICD-10-CM

## 2013-03-14 DIAGNOSIS — D539 Nutritional anemia, unspecified: Secondary | ICD-10-CM

## 2013-03-14 LAB — COMPREHENSIVE METABOLIC PANEL (CC13)
Albumin: 3.3 g/dL — ABNORMAL LOW (ref 3.5–5.0)
Alkaline Phosphatase: 92 U/L (ref 40–150)
BUN: 10.5 mg/dL (ref 7.0–26.0)
CO2: 26 mEq/L (ref 22–29)
Glucose: 66 mg/dl — ABNORMAL LOW (ref 70–99)
Potassium: 4.4 mEq/L (ref 3.5–5.1)
Sodium: 136 mEq/L (ref 136–145)
Total Protein: 6.4 g/dL (ref 6.4–8.3)

## 2013-03-14 LAB — CBC WITH DIFFERENTIAL/PLATELET
Basophils Absolute: 0 10*3/uL (ref 0.0–0.1)
Eosinophils Absolute: 0.1 10*3/uL (ref 0.0–0.5)
HGB: 11.9 g/dL (ref 11.6–15.9)
LYMPH%: 29.9 % (ref 14.0–49.7)
MCV: 90.2 fL (ref 79.5–101.0)
MONO#: 0.4 10*3/uL (ref 0.1–0.9)
MONO%: 10.7 % (ref 0.0–14.0)
NEUT#: 2.2 10*3/uL (ref 1.5–6.5)
Platelets: 225 10*3/uL (ref 145–400)
RDW: 12.9 % (ref 11.2–14.5)
WBC: 3.9 10*3/uL (ref 3.9–10.3)

## 2013-03-14 NOTE — Progress Notes (Signed)
CC:   [name]. Renne Crigler, M.D.  PROBLEM LIST:  1. Iron-deficiency anemia dating back to early 2009. GI workup  carried out in the past apparently had been negative. The precise  etiology of this patient's iron deficiency remains somewhat  unclear. Her anemia and iron deficiency state has responded to  oral iron.  2. Polymyositis diagnosed around 2000.  3. Sjogren's syndrome.  4. Raynaud's phenomenon.  5. Osteoarthritis.  6. Chronic cricopharyngeus.  7. History of esophageal stricture.  8. History of Zenker's pouch diverticulum. status post surgery on  02/17/2010.  9. Hypertension.  10.Glaucoma.  11.Hyponatremia.  12.Dysphagia with weight loss over the past year starting in 2012.   MEDICATIONS:  Reviewed and recorded. Current Outpatient Prescriptions  Medication Sig Dispense Refill  . doxycycline (MONODOX) 50 MG capsule Take 1 tablet by mouth Daily.      . hydroxychloroquine (PLAQUENIL) 200 MG tablet Take 200 mg by mouth 2 (two) times daily.      Marland Kitchen olmesartan (BENICAR) 40 MG tablet Take 40 mg by mouth daily.      Marland Kitchen omeprazole (PRILOSEC) 20 MG capsule Take 20 mg by mouth 2 (two) times daily.       . Timolol Maleate (TIMOPTIC OP) Apply 1 drop to eye daily.       Marland Kitchen aspirin 81 MG tablet Take 81 mg by mouth daily.      . calcium carbonate (OS-CAL) 600 MG TABS Take 600 mg by mouth daily.      . cycloSPORINE (RESTASIS) 0.05 % ophthalmic emulsion Place 1 drop into both eyes 2 (two) times daily.       No current facility-administered medications for this visit.   The patient stopped taking ferrous sulfate in late November 2013.   SMOKING HISTORY:  The patient has never smoked cigarettes.   HISTORY:  I saw [name] today for followup of her iron-deficiency anemia.  The patient was most recently seen by [place] on 09/13/2012 and prior to that on 03/07/2012.  Patient is here today with her husband, [name].  The patient denies any pagophagia, any evidence of blood in the stools.  She  stopped taking her iron pills in late November 2013.  She is without any major complaints today.  She fell about a week ago, landing on her rear end and has some pain there.  The patient tells me she retired from her job at [employer] about 6 months ago.  PHYSICAL EXAMINATION:  General:  There is little change.  Weight is 114 pounds 11.2 ounces, height 4 feet 11 inches, body surface area 1.47 sq m.  Vital Signs:  Blood pressure 143/80.  Other vital signs are normal. HEENT:  There is no scleral icterus.  Mouth and pharynx are benign.  No peripheral adenopathy palpable.  Heart and lungs:  Normal.  Abdomen: With the patient sitting is benign.  Extremities:  No peripheral edema or clubbing.  The patient is using a rolling walker.  Neurologic:  Exam was nonfocal.  LABORATORY DATA:  White count today 3.9, ANC 2.2, hemoglobin 11.9, hematocrit 35.0, platelets 225,000.  Chemistries today notable for an albumin of 3.3 and an AST of 39.  LDH was 236.  Iron studies and vitamin B12 level today are pending.  On 12/06/2012 ferritin was 134.  On 09/13/2012 ferritin was 104 with an iron saturation of 19%.  On 03/07/2012 ferritin was 146 and on 08/24/2011 ferritin was 267.  IMAGING STUDIES:   1. 03/05/2012 Digital Screening Bilateral Mammogram showed no mammographic  evidence of malignancy  2. 03/15/2012 Esophogram/Barium Swallow showed aspiration with coughing with the initial swallow barium. This was  a small amount of aspiration. No additional aspiration was noted during the course the study. Tight cricopharyngeus muscle, however improved from the preoperative study of 01/24/2010. The lumen is slightly smaller than on the preoperative study of 04/26/2010. Negative for Zenker's diverticulum.  3. 04/12/2012 US Soft Tissue Head/Neck/Thyroid US showed overall thyroid size is within normal limits. Subcentimeter hypoechoic nodules, more numerous on the right. Clinical significance is doubtful.    IMPRESSION AND  PLAN:  [name] appears to be doing fairly well at this time.  At this point, she is not taking oral iron.  I have told the patient that she really does not need to come here for further followup and that Dr. Renne Crigler can follow her iron studies.  I would be happy to see her again should any questions or concerns arise in the future.    ______________________________ Samul Dada, M.D. DSM/MEDQ  D:  03/14/2013  T:  03/14/2013  Job:  [id number]

## 2013-03-14 NOTE — Progress Notes (Signed)
This office note has been dictated.  #[id number]

## 2013-03-17 LAB — IRON AND TIBC
%SAT: 24 % (ref 20–55)
TIBC: 272 ug/dL (ref 250–470)

## 2013-03-17 LAB — FOLATE RBC: RBC Folate: 492 ng/mL (ref >=366)

## 2013-03-17 LAB — VITAMIN B12: Vitamin B-12: 725 pg/mL (ref 211–911)

## 2013-04-14 ENCOUNTER — Other Ambulatory Visit: Payer: Self-pay

## 2013-04-14 DIAGNOSIS — Z1231 Encounter for screening mammogram for malignant neoplasm of breast: Secondary | ICD-10-CM

## 2013-04-25 ENCOUNTER — Ambulatory Visit: Payer: Medicare Other

## 2013-05-16 ENCOUNTER — Ambulatory Visit
Admission: RE | Admit: 2013-05-16 | Discharge: 2013-05-16 | Disposition: A | Discharge status: Home / Self Care | Payer: Medicare Other | Source: Ambulatory Visit

## 2013-05-16 DIAGNOSIS — Z1231 Encounter for screening mammogram for malignant neoplasm of breast: Secondary | ICD-10-CM

## 2013-05-28 ENCOUNTER — Other Ambulatory Visit: Payer: Self-pay | Admitting: Neurosurgery

## 2013-05-28 DIAGNOSIS — M4310 Spondylolisthesis, site unspecified: Secondary | ICD-10-CM

## 2013-06-06 ENCOUNTER — Ambulatory Visit
Admission: RE | Admit: 2013-06-06 | Discharge: 2013-06-06 | Disposition: A | Discharge status: Home / Self Care | Payer: Medicare Other | Source: Ambulatory Visit | Attending: Neurosurgery | Admitting: Neurosurgery

## 2013-06-06 DIAGNOSIS — M4310 Spondylolisthesis, site unspecified: Secondary | ICD-10-CM

## 2013-06-17 ENCOUNTER — Other Ambulatory Visit: Payer: Self-pay | Admitting: Neurosurgery

## 2013-07-23 ENCOUNTER — Other Ambulatory Visit: Payer: Self-pay

## 2013-07-30 ENCOUNTER — Encounter (HOSPITAL_COMMUNITY): Payer: Self-pay | Admitting: Pharmacy Technician

## 2013-08-05 ENCOUNTER — Inpatient Hospital Stay (HOSPITAL_COMMUNITY): Admission: RE | Admit: 2013-08-05 | Payer: Medicare Other | Source: Ambulatory Visit

## 2013-08-08 ENCOUNTER — Ambulatory Visit (HOSPITAL_COMMUNITY)
Admission: RE | Admit: 2013-08-08 | Discharge: 2013-08-08 | Disposition: A | Discharge status: Home / Self Care | Payer: Medicare Other | Source: Ambulatory Visit | Attending: Anesthesiology | Admitting: Anesthesiology

## 2013-08-08 ENCOUNTER — Encounter (HOSPITAL_COMMUNITY)
Admission: RE | Admit: 2013-08-08 | Discharge: 2013-08-08 | Disposition: A | Discharge status: Home / Self Care | Payer: Medicare Other | Source: Ambulatory Visit | Attending: Neurosurgery | Admitting: Neurosurgery

## 2013-08-08 ENCOUNTER — Encounter (HOSPITAL_COMMUNITY): Payer: Self-pay

## 2013-08-08 DIAGNOSIS — M899 Disorder of bone, unspecified: Secondary | ICD-10-CM | POA: Insufficient documentation

## 2013-08-08 DIAGNOSIS — Z01818 Encounter for other preprocedural examination: Secondary | ICD-10-CM | POA: Insufficient documentation

## 2013-08-08 DIAGNOSIS — M47814 Spondylosis without myelopathy or radiculopathy, thoracic region: Secondary | ICD-10-CM | POA: Insufficient documentation

## 2013-08-08 HISTORY — DX: Dysphagia, unspecified: R13.10

## 2013-08-08 LAB — BASIC METABOLIC PANEL
Chloride: 99 mEq/L (ref 96–112)
Creatinine, Ser: 0.48 mg/dL — ABNORMAL LOW (ref 0.50–1.10)
GFR calc Af Amer: >90 mL/min (ref >90)
Potassium: 3.5 mEq/L (ref 3.5–5.1)
Sodium: 134 mEq/L — ABNORMAL LOW (ref 135–145)

## 2013-08-08 LAB — URINALYSIS, ROUTINE W REFLEX MICROSCOPIC
Bilirubin Urine: NEGATIVE
Glucose, UA: NEGATIVE mg/dL
Hgb urine dipstick: NEGATIVE
Ketones, ur: NEGATIVE mg/dL
pH: 7.5 (ref 5.0–8.0)

## 2013-08-08 LAB — CBC WITH DIFFERENTIAL/PLATELET
Eosinophils Relative: 2 % (ref 0–5)
HCT: 37.5 % (ref 36.0–46.0)
Lymphocytes Relative: 37 % (ref 12–46)
Lymphs Abs: 1.6 10*3/uL (ref 0.7–4.0)
MCH: 31.4 pg (ref 26.0–34.0)
MCV: 89.1 fL (ref 78.0–100.0)
Monocytes Absolute: 0.6 10*3/uL (ref 0.1–1.0)
RBC: 4.21 MIL/uL (ref 3.87–5.11)
WBC: 4.2 10*3/uL (ref 4.0–10.5)

## 2013-08-08 LAB — ABO/RH: ABO/RH(D): O POS

## 2013-08-08 LAB — APTT: aPTT: 26 seconds (ref 24–37)

## 2013-08-08 LAB — PROTIME-INR: Prothrombin Time: 12.8 seconds (ref 11.6–15.2)

## 2013-08-08 LAB — TYPE AND SCREEN

## 2013-08-08 NOTE — Pre-Procedure Instructions (Addendum)
[name]  08/08/2013   Your procedure is scheduled on:  8.28.14  Report to [facility] at 530AM.  Call this number if you have problems the morning of surgery: [phone]   Remember:   Do not eat food or drink liquids after midnight.   Take these medicines the morning of surgery with A SIP OF WATER: ,omeprazole, pain med              STOP  Aspirin sat 8.23.14   Do not wear jewelry, make-up or nail polish.  Do not wear lotions, powders, or perfumes. You may wear deodorant.  Do not shave 48 hours prior to surgery. Men may shave face and neck.  Do not bring valuables to the hospital.  [facility] is not responsible                   for any belongings or valuables.  Contacts, dentures or bridgework may not be worn into surgery.  Leave suitcase in the car. After surgery it may be brought to your room.  For patients admitted to the hospital, checkout time is 11:00 AM the day of  discharge.   Patients discharged the day of surgery will not be allowed to drive  home.  Name and phone number of your driver:   Special Instructions: Shower using CHG 2 nights before surgery and the night before surgery.  If you shower the day of surgery use CHG.  Use special wash - you have one bottle of CHG for all showers.  You should use approximately 1/3 of the bottle for each shower.   Please read over the following fact sheets that you were given: Pain Booklet, Coughing and Deep Breathing, Blood Transfusion Information, MRSA Information and Surgical Site Infection Prevention

## 2013-08-13 MED ORDER — CEFAZOLIN SODIUM-DEXTROSE 2-3 GM-% IV SOLR
2.0000 g | INTRAVENOUS | Status: AC
  Administered 2013-08-14 (×2): 2 g via INTRAVENOUS
  Filled 2013-08-13: qty 50

## 2013-08-14 ENCOUNTER — Inpatient Hospital Stay (HOSPITAL_COMMUNITY): Payer: Medicare Other

## 2013-08-14 ENCOUNTER — Encounter (HOSPITAL_COMMUNITY): Payer: Self-pay | Admitting: Anesthesiology

## 2013-08-14 ENCOUNTER — Encounter (HOSPITAL_COMMUNITY)
Admission: RE | Disposition: A | Discharge status: Home / Self Care | Payer: Self-pay | Source: Ambulatory Visit | Attending: Neurosurgery

## 2013-08-14 ENCOUNTER — Inpatient Hospital Stay (HOSPITAL_COMMUNITY)
Admission: RE | Admit: 2013-08-14 | Discharge: 2013-08-17 | DRG: 460 | Disposition: A | Discharge status: Home / Self Care | Payer: Medicare Other | Source: Ambulatory Visit | Attending: Neurosurgery | Admitting: Neurosurgery

## 2013-08-14 ENCOUNTER — Inpatient Hospital Stay (HOSPITAL_COMMUNITY): Payer: Medicare Other | Admitting: Anesthesiology

## 2013-08-14 ENCOUNTER — Encounter (HOSPITAL_COMMUNITY): Payer: Self-pay | Admitting: *Deleted

## 2013-08-14 DIAGNOSIS — H409 Unspecified glaucoma: Secondary | ICD-10-CM | POA: Diagnosis present

## 2013-08-14 DIAGNOSIS — M899 Disorder of bone, unspecified: Secondary | ICD-10-CM | POA: Diagnosis present

## 2013-08-14 DIAGNOSIS — R262 Difficulty in walking, not elsewhere classified: Secondary | ICD-10-CM | POA: Diagnosis present

## 2013-08-14 DIAGNOSIS — Q762 Congenital spondylolisthesis: Secondary | ICD-10-CM

## 2013-08-14 DIAGNOSIS — K219 Gastro-esophageal reflux disease without esophagitis: Secondary | ICD-10-CM | POA: Diagnosis present

## 2013-08-14 DIAGNOSIS — H4020X0 Unspecified primary angle-closure glaucoma, stage unspecified: Secondary | ICD-10-CM | POA: Diagnosis present

## 2013-08-14 DIAGNOSIS — Z7982 Long term (current) use of aspirin: Secondary | ICD-10-CM

## 2013-08-14 DIAGNOSIS — M48061 Spinal stenosis, lumbar region without neurogenic claudication: Principal | ICD-10-CM | POA: Diagnosis present

## 2013-08-14 DIAGNOSIS — M4120 Other idiopathic scoliosis, site unspecified: Secondary | ICD-10-CM | POA: Diagnosis present

## 2013-08-14 HISTORY — PX: LUMBAR FUSION: SHX111

## 2013-08-14 SURGERY — POSTERIOR LUMBAR FUSION 3 LEVEL
Anesthesia: General | Site: Back | Wound class: Clean

## 2013-08-14 MED ORDER — NEOSTIGMINE METHYLSULFATE 1 MG/ML IJ SOLN
INTRAMUSCULAR | Status: DC | PRN
  Administered 2013-08-14: 3 mg via INTRAVENOUS

## 2013-08-14 MED ORDER — LIDOCAINE-EPINEPHRINE 1 %-1:100000 IJ SOLN
INTRAMUSCULAR | Status: DC | PRN
  Administered 2013-08-14: 19 mL

## 2013-08-14 MED ORDER — ACETAMINOPHEN 325 MG PO TABS: 650.0000 mg | ORAL_TABLET | ORAL | Status: DC | PRN

## 2013-08-14 MED ORDER — METHOCARBAMOL 100 MG/ML IJ SOLN: 500.0000 mg | Freq: Four times a day (QID) | INTRAVENOUS | Status: DC | PRN

## 2013-08-14 MED ORDER — PROMETHAZINE HCL 25 MG/ML IJ SOLN: 6.2500 mg | INTRAMUSCULAR | Status: DC | PRN

## 2013-08-14 MED ORDER — MIDAZOLAM HCL 5 MG/5ML IJ SOLN
INTRAMUSCULAR | Status: DC | PRN
  Administered 2013-08-14: 2 mg via INTRAVENOUS

## 2013-08-14 MED ORDER — ROCURONIUM BROMIDE 100 MG/10ML IV SOLN
INTRAVENOUS | Status: DC | PRN
  Administered 2013-08-14: 10 mg via INTRAVENOUS
  Administered 2013-08-14: 40 mg via INTRAVENOUS

## 2013-08-14 MED ORDER — PROPOFOL 10 MG/ML IV BOLUS
INTRAVENOUS | Status: DC | PRN
  Administered 2013-08-14: 120 mg via INTRAVENOUS

## 2013-08-14 MED ORDER — ONDANSETRON HCL 4 MG/2ML IJ SOLN
INTRAMUSCULAR | Status: DC | PRN
  Administered 2013-08-14: 4 mg via INTRAVENOUS

## 2013-08-14 MED ORDER — OXYCODONE HCL 5 MG/5ML PO SOLN: 5.0000 mg | Freq: Once | ORAL | Status: AC | PRN

## 2013-08-14 MED ORDER — DIPHENHYDRAMINE HCL 50 MG/ML IJ SOLN: 12.5000 mg | Freq: Four times a day (QID) | INTRAMUSCULAR | Status: DC | PRN

## 2013-08-14 MED ORDER — FENTANYL CITRATE 0.05 MG/ML IJ SOLN
INTRAMUSCULAR | Status: DC | PRN
  Administered 2013-08-14: 25 ug via INTRAVENOUS
  Administered 2013-08-14: 250 ug via INTRAVENOUS
  Administered 2013-08-14: 25 ug via INTRAVENOUS

## 2013-08-14 MED ORDER — PANTOPRAZOLE SODIUM 40 MG PO TBEC
40.0000 mg | DELAYED_RELEASE_TABLET | Freq: Every day | ORAL | Status: DC
  Administered 2013-08-15 – 2013-08-17 (×3): 40 mg via ORAL
  Filled 2013-08-14 (×3): qty 1

## 2013-08-14 MED ORDER — MEPERIDINE HCL 25 MG/ML IJ SOLN: 6.2500 mg | INTRAMUSCULAR | Status: DC | PRN

## 2013-08-14 MED ORDER — CEFAZOLIN SODIUM-DEXTROSE 2-3 GM-% IV SOLR
INTRAVENOUS | Status: AC
  Filled 2013-08-14: qty 50

## 2013-08-14 MED ORDER — SODIUM CHLORIDE 0.9 % IR SOLN
Status: DC | PRN
  Administered 2013-08-14: 09:00:00

## 2013-08-14 MED ORDER — ONDANSETRON HCL 4 MG/2ML IJ SOLN: 4.0000 mg | Freq: Four times a day (QID) | INTRAMUSCULAR | Status: DC | PRN

## 2013-08-14 MED ORDER — HYDROMORPHONE HCL PF 1 MG/ML IJ SOLN
INTRAMUSCULAR | Status: AC
  Filled 2013-08-14: qty 1

## 2013-08-14 MED ORDER — TIMOLOL MALEATE 0.5 % OP SOLN
1.0000 [drp] | Freq: Every day | OPHTHALMIC | Status: DC
  Administered 2013-08-14 – 2013-08-16 (×3): 1 [drp] via OPHTHALMIC
  Filled 2013-08-14: qty 5

## 2013-08-14 MED ORDER — PROMETHAZINE HCL 25 MG PO TABS: 12.5000 mg | ORAL_TABLET | ORAL | Status: DC | PRN

## 2013-08-14 MED ORDER — MORPHINE SULFATE (PF) 1 MG/ML IV SOLN
INTRAVENOUS | Status: AC
  Filled 2013-08-14: qty 25

## 2013-08-14 MED ORDER — OXYCODONE HCL 5 MG PO TABS
ORAL_TABLET | ORAL | Status: AC
  Filled 2013-08-14: qty 1

## 2013-08-14 MED ORDER — IRBESARTAN 300 MG PO TABS
300.0000 mg | ORAL_TABLET | Freq: Every day | ORAL | Status: DC
  Administered 2013-08-14 – 2013-08-16 (×2): 300 mg via ORAL
  Filled 2013-08-14 (×4): qty 1

## 2013-08-14 MED ORDER — GLYCOPYRROLATE 0.2 MG/ML IJ SOLN
INTRAMUSCULAR | Status: DC | PRN
  Administered 2013-08-14: 0.4 mg via INTRAVENOUS

## 2013-08-14 MED ORDER — DIPHENHYDRAMINE HCL 12.5 MG/5ML PO ELIX: 12.5000 mg | ORAL_SOLUTION | Freq: Four times a day (QID) | ORAL | Status: DC | PRN

## 2013-08-14 MED ORDER — SODIUM CHLORIDE 0.9 % IJ SOLN: 9.0000 mL | INTRAMUSCULAR | Status: DC | PRN

## 2013-08-14 MED ORDER — LIDOCAINE HCL (CARDIAC) 20 MG/ML IV SOLN
INTRAVENOUS | Status: DC | PRN
  Administered 2013-08-14: 30 mg via INTRAVENOUS

## 2013-08-14 MED ORDER — PHENYLEPHRINE HCL 10 MG/ML IJ SOLN
INTRAMUSCULAR | Status: DC | PRN
  Administered 2013-08-14: 40 ug via INTRAVENOUS
  Administered 2013-08-14: 80 ug via INTRAVENOUS

## 2013-08-14 MED ORDER — BISACODYL 10 MG RE SUPP: 10.0000 mg | Freq: Every day | RECTAL | Status: DC | PRN

## 2013-08-14 MED ORDER — MIDAZOLAM HCL 2 MG/2ML IJ SOLN: 0.5000 mg | Freq: Once | INTRAMUSCULAR | Status: DC | PRN

## 2013-08-14 MED ORDER — TRAMADOL HCL 50 MG PO TABS: 50.0000 mg | ORAL_TABLET | Freq: Every day | ORAL | Status: DC | PRN

## 2013-08-14 MED ORDER — KCL IN DEXTROSE-NACL 20-5-0.45 MEQ/L-%-% IV SOLN
INTRAVENOUS | Status: DC
  Administered 2013-08-14 – 2013-08-15 (×3): via INTRAVENOUS
  Filled 2013-08-14 (×7): qty 1000

## 2013-08-14 MED ORDER — CALCIUM CARBONATE-VITAMIN D 500-200 MG-UNIT PO TABS
2.0000 | ORAL_TABLET | Freq: Every day | ORAL | Status: DC
  Administered 2013-08-16 – 2013-08-17 (×2): 2 via ORAL
  Filled 2013-08-14 (×4): qty 2

## 2013-08-14 MED ORDER — METHOCARBAMOL 500 MG PO TABS
500.0000 mg | ORAL_TABLET | Freq: Four times a day (QID) | ORAL | Status: DC | PRN
  Administered 2013-08-15 – 2013-08-17 (×3): 500 mg via ORAL
  Filled 2013-08-14 (×4): qty 1

## 2013-08-14 MED ORDER — SODIUM CHLORIDE 0.9 % IJ SOLN: 3.0000 mL | INTRAMUSCULAR | Status: DC | PRN

## 2013-08-14 MED ORDER — SODIUM CHLORIDE 0.9 % IV SOLN: 250.0000 mL | INTRAVENOUS | Status: DC

## 2013-08-14 MED ORDER — NALOXONE HCL 0.4 MG/ML IJ SOLN: 0.4000 mg | INTRAMUSCULAR | Status: DC | PRN

## 2013-08-14 MED ORDER — SODIUM CHLORIDE 0.9 % IJ SOLN
3.0000 mL | Freq: Two times a day (BID) | INTRAMUSCULAR | Status: DC
  Administered 2013-08-16: 3 mL via INTRAVENOUS

## 2013-08-14 MED ORDER — POLYETHYLENE GLYCOL 3350 17 G PO PACK
17.0000 g | PACK | Freq: Every day | ORAL | Status: DC
  Administered 2013-08-14 – 2013-08-17 (×4): 17 g via ORAL
  Filled 2013-08-14 (×4): qty 1

## 2013-08-14 MED ORDER — LACTATED RINGERS IV SOLN
INTRAVENOUS | Status: DC | PRN
  Administered 2013-08-14 (×3): via INTRAVENOUS

## 2013-08-14 MED ORDER — THROMBIN 20000 UNITS EX SOLR
CUTANEOUS | Status: DC | PRN
  Administered 2013-08-14: 09:00:00 via TOPICAL

## 2013-08-14 MED ORDER — TIMOLOL HEMIHYDRATE 0.5 % OP SOLN
1.0000 [drp] | Freq: Every day | OPHTHALMIC | Status: DC
  Filled 2013-08-14 (×9): qty 0.1

## 2013-08-14 MED ORDER — KETOROLAC TROMETHAMINE 30 MG/ML IJ SOLN
15.0000 mg | Freq: Four times a day (QID) | INTRAMUSCULAR | Status: AC
  Administered 2013-08-14 – 2013-08-15 (×4): 15 mg via INTRAVENOUS
  Filled 2013-08-14 (×5): qty 1

## 2013-08-14 MED ORDER — DOCUSATE SODIUM 100 MG PO CAPS: 100.0000 mg | ORAL_CAPSULE | Freq: Two times a day (BID) | ORAL | Status: DC

## 2013-08-14 MED ORDER — CALCIUM 600+D PLUS MINERALS 600-400 MG-UNIT PO CHEW: 2.0000 | CHEWABLE_TABLET | Freq: Every day | ORAL | Status: DC

## 2013-08-14 MED ORDER — 0.9 % SODIUM CHLORIDE (POUR BTL) OPTIME
TOPICAL | Status: DC | PRN
  Administered 2013-08-14: 1000 mL

## 2013-08-14 MED ORDER — PROMETHAZINE HCL 25 MG/ML IJ SOLN
12.5000 mg | INTRAMUSCULAR | Status: DC | PRN
  Filled 2013-08-14: qty 1

## 2013-08-14 MED ORDER — CEFAZOLIN SODIUM 1-5 GM-% IV SOLN
1.0000 g | Freq: Three times a day (TID) | INTRAVENOUS | Status: AC
  Administered 2013-08-14 – 2013-08-15 (×2): 1 g via INTRAVENOUS
  Filled 2013-08-14 (×2): qty 50

## 2013-08-14 MED ORDER — CYCLOBENZAPRINE HCL 10 MG PO TABS: 10.0000 mg | ORAL_TABLET | Freq: Three times a day (TID) | ORAL | Status: DC | PRN

## 2013-08-14 MED ORDER — ARTIFICIAL TEARS OP OINT
TOPICAL_OINTMENT | OPHTHALMIC | Status: DC | PRN
  Administered 2013-08-14: 1 via OPHTHALMIC

## 2013-08-14 MED ORDER — ACETAMINOPHEN 650 MG RE SUPP: 650.0000 mg | RECTAL | Status: DC | PRN

## 2013-08-14 MED ORDER — MORPHINE SULFATE (PF) 1 MG/ML IV SOLN
INTRAVENOUS | Status: DC
  Administered 2013-08-14 (×2): via INTRAVENOUS
  Administered 2013-08-14: 5 mg via INTRAVENOUS
  Administered 2013-08-14: 2 mg via INTRAVENOUS
  Administered 2013-08-15: 20 mg via INTRAVENOUS
  Administered 2013-08-15: 2 mg via INTRAVENOUS
  Administered 2013-08-15: 5 mg via INTRAVENOUS
  Filled 2013-08-14: qty 25

## 2013-08-14 MED ORDER — MAGNESIUM HYDROXIDE 400 MG/5ML PO SUSP: 30.0000 mL | Freq: Every day | ORAL | Status: DC | PRN

## 2013-08-14 MED ORDER — OXYCODONE HCL 5 MG PO TABS
5.0000 mg | ORAL_TABLET | Freq: Once | ORAL | Status: AC | PRN
  Administered 2013-08-14: 5 mg via ORAL

## 2013-08-14 MED ORDER — HYDROMORPHONE HCL PF 1 MG/ML IJ SOLN
0.2500 mg | INTRAMUSCULAR | Status: DC | PRN
  Administered 2013-08-14 (×4): 0.5 mg via INTRAVENOUS

## 2013-08-14 MED ORDER — HYDROMORPHONE HCL PF 1 MG/ML IJ SOLN
INTRAMUSCULAR | Status: AC
  Administered 2013-08-14: 0.5 mg via INTRAVENOUS
  Filled 2013-08-14: qty 1

## 2013-08-14 MED ORDER — ONDANSETRON HCL 4 MG/2ML IJ SOLN: 4.0000 mg | INTRAMUSCULAR | Status: DC | PRN

## 2013-08-14 SURGICAL SUPPLY — 73 items
BAG DECANTER FOR FLEXI CONT (MISCELLANEOUS) ×2 IMPLANT
BENZOIN TINCTURE PRP APPL 2/3 (GAUZE/BANDAGES/DRESSINGS) ×2 IMPLANT
BLADE SURG ROTATE 9660 (MISCELLANEOUS) IMPLANT
BONE VOID FILLER STRIP 10CC (Bone Implant) ×2 IMPLANT
BUR PRECISION FLUTE 5.0 (BURR) ×2 IMPLANT
CAGE BULLET CONCORDE 9X8X23 (Cage) ×2 IMPLANT
CAGE CONCORDE BULLET 9X7X23 (Cage) ×2 IMPLANT
CAGE CONCORDE BULLET 9X9X23 (Cage) ×2 IMPLANT
CAGE SPNL PRLL BLT NOSE 23X9X9 (Cage) ×2 IMPLANT
CANISTER SUCTION 2500CC (MISCELLANEOUS) ×2 IMPLANT
CLOTH BEACON ORANGE TIMEOUT ST (SAFETY) ×2 IMPLANT
CONNECTOR EXPEDIUM SFX SZ A5 (Orthopedic Implant) ×2 IMPLANT
CONT SPEC 4OZ CLIKSEAL STRL BL (MISCELLANEOUS) ×4 IMPLANT
COVER BACK TABLE 24X17X13 BIG (DRAPES) IMPLANT
COVER TABLE BACK 60X90 (DRAPES) ×2 IMPLANT
DRAPE C-ARM 42X72 X-RAY (DRAPES) ×4 IMPLANT
DRAPE LAPAROTOMY 100X72X124 (DRAPES) ×2 IMPLANT
DRAPE POUCH INSTRU U-SHP 10X18 (DRAPES) ×2 IMPLANT
DRAPE PROXIMA HALF (DRAPES) ×2 IMPLANT
DRAPE SURG 17X23 STRL (DRAPES) ×2 IMPLANT
DRESSING TELFA 8X3 (GAUZE/BANDAGES/DRESSINGS) ×2 IMPLANT
DURAPREP 26ML APPLICATOR (WOUND CARE) ×2 IMPLANT
ELECT REM PT RETURN 9FT ADLT (ELECTROSURGICAL) ×2
ELECTRODE REM PT RTRN 9FT ADLT (ELECTROSURGICAL) ×1 IMPLANT
GAUZE SPONGE 4X4 16PLY XRAY LF (GAUZE/BANDAGES/DRESSINGS) IMPLANT
GLOVE BIO SURGEON STRL SZ8 (GLOVE) ×2 IMPLANT
GLOVE BIOGEL PI IND STRL 7.0 (GLOVE) ×2 IMPLANT
GLOVE BIOGEL PI IND STRL 8 (GLOVE) ×1 IMPLANT
GLOVE BIOGEL PI IND STRL 8.5 (GLOVE) ×1 IMPLANT
GLOVE BIOGEL PI INDICATOR 7.0 (GLOVE) ×2
GLOVE BIOGEL PI INDICATOR 8 (GLOVE) ×1
GLOVE BIOGEL PI INDICATOR 8.5 (GLOVE) ×1
GLOVE ECLIPSE 7.5 STRL STRAW (GLOVE) ×6 IMPLANT
GLOVE EXAM NITRILE LRG STRL (GLOVE) IMPLANT
GLOVE EXAM NITRILE MD LF STRL (GLOVE) IMPLANT
GLOVE EXAM NITRILE XL STR (GLOVE) IMPLANT
GLOVE EXAM NITRILE XS STR PU (GLOVE) IMPLANT
GLOVE SURG SS PI 7.0 STRL IVOR (GLOVE) ×8 IMPLANT
GOWN BRE IMP SLV AUR LG STRL (GOWN DISPOSABLE) ×2 IMPLANT
GOWN BRE IMP SLV AUR XL STRL (GOWN DISPOSABLE) ×8 IMPLANT
GOWN STRL REIN 2XL LVL4 (GOWN DISPOSABLE) ×4 IMPLANT
KIT BASIN OR (CUSTOM PROCEDURE TRAY) ×2 IMPLANT
KIT ROOM TURNOVER OR (KITS) ×2 IMPLANT
MILL MEDIUM DISP (BLADE) ×2 IMPLANT
MIX DBX 10CC 35% BONE (Bone Implant) ×2 IMPLANT
NEEDLE HYPO 22GX1.5 SAFETY (NEEDLE) ×2 IMPLANT
NS IRRIG 1000ML POUR BTL (IV SOLUTION) ×2 IMPLANT
PACK LAMINECTOMY NEURO (CUSTOM PROCEDURE TRAY) ×2 IMPLANT
PAD ARMBOARD 7.5X6 YLW CONV (MISCELLANEOUS) ×6 IMPLANT
PATTIES SURGICAL .5 X.5 (GAUZE/BANDAGES/DRESSINGS) IMPLANT
PATTIES SURGICAL .5 X1 (DISPOSABLE) ×2 IMPLANT
PATTIES SURGICAL .75X.75 (GAUZE/BANDAGES/DRESSINGS) ×2 IMPLANT
PATTIES SURGICAL 1X1 (DISPOSABLE) IMPLANT
ROD EXPEDIUM PREBENT 85MM (Rod) ×4 IMPLANT
SCREW EXPEDIUM POLYAXIAL 6X45M (Screw) ×16 IMPLANT
SCREW SET SINGLE INNER (Screw) ×16 IMPLANT
SPONGE GAUZE 4X4 12PLY (GAUZE/BANDAGES/DRESSINGS) ×2 IMPLANT
SPONGE LAP 4X18 X RAY DECT (DISPOSABLE) IMPLANT
SPONGE SURGIFOAM ABS GEL 100 (HEMOSTASIS) ×2 IMPLANT
STRIP CLOSURE SKIN 1/2X4 (GAUZE/BANDAGES/DRESSINGS) ×2 IMPLANT
STRIP NEXOSS 5CC (Neuro Prosthesis/Implant) ×2 IMPLANT
SUT VIC AB 0 CT1 18XCR BRD8 (SUTURE) ×2 IMPLANT
SUT VIC AB 0 CT1 8-18 (SUTURE) ×2
SUT VIC AB 2-0 CP2 18 (SUTURE) ×4 IMPLANT
SUT VIC AB 3-0 SH 8-18 (SUTURE) ×4 IMPLANT
SYR 20CC LL (SYRINGE) ×2 IMPLANT
SYR CONTROL 10ML LL (SYRINGE) IMPLANT
TAPE CLOTH SURG 4X10 WHT LF (GAUZE/BANDAGES/DRESSINGS) ×2 IMPLANT
TOWEL OR 17X24 6PK STRL BLUE (TOWEL DISPOSABLE) ×2 IMPLANT
TOWEL OR 17X26 10 PK STRL BLUE (TOWEL DISPOSABLE) ×2 IMPLANT
TRAP SPECIMEN MUCOUS 40CC (MISCELLANEOUS) ×2 IMPLANT
TRAY FOLEY CATH 14FRSI W/METER (CATHETERS) ×2 IMPLANT
WATER STERILE IRR 1000ML POUR (IV SOLUTION) ×2 IMPLANT

## 2013-08-14 NOTE — Preoperative (Signed)
Beta Blockers   Reason not to administer Beta Blockers:Not Applicable 

## 2013-08-14 NOTE — Anesthesia Preprocedure Evaluation (Addendum)
Anesthesia Evaluation  Patient identified by MRN, date of birth, ID band Patient awake    Reviewed: Allergy & Precautions, H&P , NPO status , Patient's Chart, lab work & pertinent test results  History of Anesthesia Complications Negative for: history of anesthetic complications  Airway Mallampati: I TM Distance: >3 FB Neck ROM: Full    Dental  (+) Edentulous Upper and Edentulous Lower   Pulmonary neg pulmonary ROS,  breath sounds clear to auscultation  Pulmonary exam normal       Cardiovascular hypertension, Pt. on medications Rhythm:Regular Rate:Normal     Neuro/Psych negative neurological ROS     GI/Hepatic Neg liver ROS, GERD-  Medicated and Controlled,  Endo/Other  Sjogren's syndrome Raynaud's polymyositis  Renal/GU negative Renal ROS     Musculoskeletal   Abdominal   Peds  Hematology   Anesthesia Other Findings   Reproductive/Obstetrics                          Anesthesia Physical Anesthesia Plan  ASA: II  Anesthesia Plan: General   Post-op Pain Management:    Induction: Intravenous  Airway Management Planned: Oral ETT  Additional Equipment:   Intra-op Plan:   Post-operative Plan: Extubation in OR  Informed Consent: I have reviewed the patients History and Physical, chart, labs and discussed the procedure including the risks, benefits and alternatives for the proposed anesthesia with the patient or authorized representative who has indicated his/her understanding and acceptance.     Plan Discussed with: CRNA and Surgeon  Anesthesia Plan Comments: (Plan routine monitors, GETA)        Anesthesia Quick Evaluation

## 2013-08-14 NOTE — Progress Notes (Signed)
Patient admitted to room 4N27 with orders to have back brace on when OOB. Brace not with patient and she stated she was measured for brace a month ago and it was supposed to be delivered to PACU.Marland Kitchen PACU called and writer spoke with Dianne, surgical tech and she stated there was no brace in PACU with patient's name. Will notify MD.

## 2013-08-14 NOTE — H&P (Signed)
Subjective: Back and leg pain, trouble walking  Patient Active Problem List   Diagnosis Date Noted  . Unspecified deficiency anemia 03/14/2013  . Uterine prolapse 04/16/2012  . Cystocele 04/16/2012  . Rectocele 04/16/2012  . Osteopenia   . IRON DEFICIENCY 01/12/2010  . 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 DYSPHAGIA UNSPECIFIED 01/12/2010   Past Medical History  Diagnosis Date  . HTN (hypertension)   . Glaucoma   . Sjogren's syndrome   . Raynaud phenomenon   . Osteopenia   . OA (osteoarthritis)   . Polymyositis   . GERD (gastroesophageal reflux disease)   . Swallowing difficulty     unable to swallow whole pills  . AAA (abdominal aortic aneurysm)     small      report in epic 11/11 states no aaa    . Iron deficiency anemia     hx  . Unspecified deficiency anemia 03/14/2013    Past Surgical History  Procedure Laterality Date  . Cataract extraction Bilateral 12  . Esophageal dilation  11  . Repair zenker's diverticula  02/2010  . Dental surgery      dental implants    Prescriptions prior to admission  Medication Sig Dispense Refill  . Calcium Carbonate-Vit D-Min (CALCIUM 600+D PLUS MINERALS) 600-400 MG-UNIT CHEW Chew 2 tablets by mouth daily.      . hydroxychloroquine (PLAQUENIL) 200 MG tablet Take 200 mg by mouth 2 (two) times daily.      Marland Kitchen olmesartan (BENICAR) 40 MG tablet Take 40 mg by mouth daily.      Marland Kitchen omeprazole (PRILOSEC) 20 MG capsule Take 20 mg by mouth 2 (two) times daily.       . timolol (BETIMOL) 0.5 % ophthalmic solution Place 1 drop into both eyes at bedtime.      . traMADol (ULTRAM) 50 MG tablet Take 50 mg by mouth daily as needed for pain.      Marland Kitchen aspirin 81 MG chewable tablet Chew 81 mg by mouth daily.       No Known Allergies  History  Substance Use Topics  . Smoking status: Never Smoker   . Smokeless tobacco: Never Used  . Alcohol Use: No    Family History  Problem Relation Age of Onset  . Cancer Mother     ovarian  . Ovarian cancer  Mother   . Cancer Father     colon and breast  . Hypertension Father   . Heart disease Father   . Breast cancer Father     Age 16  . Colon cancer Father   . Psoriasis Sister   . Sarcoidosis Sister   . Arthritis Sister     psoriatic arthritis  . Diabetes Sister   . Arthritis Maternal Uncle     rheumatiod arthritis  . Diabetes Son   . Heart disease Brother     Review of Systems A comprehensive review of systems was negative.  Objective: Vital signs in last 24 hours: Temp:  [97.3 F (36.3 C)] 97.3 F (36.3 C) (08/28 0612) Pulse Rate:  [61] 61 (08/28 0612) Resp:  [16] 16 (08/28 0612) BP: (172)/(99) 172/99 mmHg (08/28 0612) SpO2:  [100 %] 100 % (08/28 0612)  PE - negative SLR,  soem decrease ROM back  - some prox LE weakness - sensory intact  Data Review No results found for this or any previous visit (from the past 24 hour(s)).  Assessment/Plan: Pt with severe stenosis L4-5 , and scoliosis and multilevel degenerative changes  -  pt admitted for L2-5 decompression and fusion with instrumentation   [name], MD 08/14/2013 7:36 AM

## 2013-08-14 NOTE — Op Note (Signed)
08/14/2013  12:20 PM  PATIENT:  [name]  67 y.o. female  PRE-OPERATIVE DIAGNOSIS:  Spondylolisthesis, Lumbar stenosis, Lumbar radiculopathy, Lumbago, scoliosis  POST-OPERATIVE DIAGNOSIS:  Spondylolisthesis, Lumbar stenosis, Lumbar radiculopathy, Lumbago, scoliosis  PROCEDURE:  Procedure(s):  Decompressive laminectomy decompressing the L2, L3, L4 ,L5 roots (4 levels ),   PLIF Lumbar two-three, Lumbar three-four, Lumbar four-five ( 3 levels)  with interbody cages  At L2-3, L3-4 , L4-5 ( 3 levels) ,  expedium segmented pedicle screw fixation L2-5 ,  Autograft ,  Allograft , bone marrow aspirate   SURGEON:  Surgeon(s): [name], MD Maeola Harman, MD-assist    ANESTHESIA:   general  EBL:  Total I/O In: 2000 [I.V.:2000] Out: 875 [Urine:675; Blood:200]  BLOOD ADMINISTERED:none  DRAINS: none   SPECIMEN:  No Specimen  DICTATION: Patient with back and leg pain trouble walking patient is severe stenosis and unstable anterolisthesis at 45 left lateral recess stenosis to 34 and 2-3 ,  significant scoliosis with lateral thesis 2 on 3.  This is also been progressive from previous studies: The patient's symptoms after much discussion was decided to proceed with surgical reduction decompression fusion L2-5 with instrumentation  ) Operating room general anesthesia induced patient placed in prone position Wilson frame all pressure points padded. Patient prepped draped sterile fashion the incision inject with 20 cc morcellized with epinephrine. Incision made in the midline lower lumbar spine incision taken the fascia hemostasis obtained with position fascia incised and subperiosteal dissection was done over spinous process lamina out to the facets markers were placed interspaces and x-rays obtained some get or positioning the markers were at the L1 to to 3 interspace we then extended our incision caudally to expose the 3445 levels in the expose the lateral facets and also to the transverse  procWe placed markers again to another x-ray and this did confirm or positioning. Decompressive laminectomy was then done removing by mouth L2 spinous process lamina all 304 the top of 5 decompression the central canal and removed to laterally to do medial facetectomies to decompress the lateral gutters and we decompressed the nerve roots bilaterally at L2 L3-L4 and L5 nerve root expose the lateral edges were well decompressed and this was much more the decompression then needed just for posterior lumbar interbody fusion to make sure that is the scoliosis and severe spinal change at these roots were well decompressed. We explored the space starting at the to 3 on the left side which of the down side of the scoliosis incised the disc space distracted interspace up to a 7 mm we decided to leave the other side intact. Interbody space for the right fusion to 7 mm cage with autograft bone was cleaned from her removing the laminectomy cleaned from soft tissue chart the small pieces of bone plugs and this mixture was packed in the cages were packed the interspace and tapped the cage into position 7 mm cage was used there. This process was repeated at L3-4 level and we incised the left side of the space did discectomy distracted interspace up to 8 mm prepare the interbody space for interbody fusion with broaches and curettes interspace was placed 8 mm interbody cage at that level on the left side at both levels indicated and directing medially and oblique of placed cage.   Help for vomiting and each redo decompression nerve roots explored the disc space incised the space bilaterally and distracted the interspace up to 9 mm at the interbody space with bone  and tapped 9 mm cage into position strength the right side) x-ray and delivered cages in the cage at the fourth of level fracture going down into the vertebral body to remove the left side placed the cages in the left side and repeat making sure was well positioned in  the interspace.  Explore the dura lumbar vertebrae good decompression. We is high-speed drill to decorticate lateral facets transverse processes were then used fluoroscopy and intraocular marks on her pedicle entry points and then placed a probe down the pedicle checked with small ball probe and sure he did bony circumference aspirated bone marrow aspirate to placed this into the fatty or sponge and allograft sponge to use later in the case tapped the hole and then placed Expedium pedicle screw is L2 L3-L4 and L5 bilaterally. We took final AP and lateral fluoroscopic images showing good position of her the cages and pedicle to the point change in her left its 5 is in the Enemy Swim to find that was left and position was not impinging the nerve roots. Roger then placed in the screw heads locking nuts placed and final tightened bilaterally and a cross connector was then placed all the autograft bone putty mixture was then packed in the posterolateral gutters for posterior lateral fusion L2-L5 and the allograft sponge soaked with bone aspirate was also packed the posterolateral gutters bilaterally. We can check the dura nerve root she did decompression central canal and nerve roots the L2 L3-L4 and L5 roots bilaterally well decompressed we placed Gelfoam over the lateral gutters and the bone fragments we impinging nerve roots retractors removed fascia closed with 0 Vicryl interrupted sutures of his tissue closed with 021 through Vicryl interrupted sutures skin closed benzoin Steri-Strips dressing was placed patient placed back in spine position woken (and transferred recovery.  PLAN OF CARE: Admit to inpatient   PATIENT DISPOSITION:  PACU - hemodynamically stable.

## 2013-08-14 NOTE — Anesthesia Procedure Notes (Signed)
Procedure Name: Intubation Date/Time: 08/14/2013 7:47 AM Performed by: Jerilee Hoh Pre-anesthesia Checklist: Patient identified, Emergency Drugs available, Suction available and Patient being monitored Patient Re-evaluated:Patient Re-evaluated prior to inductionOxygen Delivery Method: Circle system utilized Preoxygenation: Pre-oxygenation with 100% oxygen Intubation Type: IV induction Ventilation: Mask ventilation without difficulty and Oral airway inserted - appropriate to patient size Laryngoscope Size: Mac and 3 Grade View: Grade I Tube type: Oral Tube size: 7.5 mm Number of attempts: 1 Airway Equipment and Method: Stylet Placement Confirmation: ETT inserted through vocal cords under direct vision,  positive ETCO2 and breath sounds checked- equal and bilateral Secured at: 20 cm Tube secured with: Tape Dental Injury: Teeth and Oropharynx as per pre-operative assessment

## 2013-08-14 NOTE — Progress Notes (Signed)
Utilization Review Completed.   Ife Vitelli, RN, BSN Nurse Case Manager  [phone]  

## 2013-08-14 NOTE — Plan of Care (Signed)
Problem: Consults Goal: Diagnosis - Spinal Surgery Thoraco/Lumbar Spine Fusion     

## 2013-08-14 NOTE — Interval H&P Note (Signed)
History and Physical Interval Note:  08/14/2013 7:40 AM  [name]  has presented today for surgery, with the diagnosis of Spondylolisthesis, Lumbar stenosis, Lumbar radiculopathy, Lumbago  The various methods of treatment have been discussed with the patient and family. After consideration of risks, benefits and other options for treatment, the patient has consented to  Procedure(s) with comments: POSTERIOR LUMBAR FUSION 3 LEVEL (N/A) - L2-3 L3-4 L4-5 Posterior lumbar interbody fusion/expedium screws/sabre cages as a surgical intervention .  The patient's history has been reviewed, patient examined, no change in status, stable for surgery.  I have reviewed the patient's chart and labs.  Questions were answered to the patient's satisfaction.     Algie Cales R

## 2013-08-14 NOTE — Progress Notes (Signed)
Patient's son Artist that TSLO brace was delivered few minutes ago. Noted in room and reminded patient to have it on before getting up out of bed.

## 2013-08-14 NOTE — Transfer of Care (Signed)
Immediate Anesthesia Transfer of Care Note  Patient: [name]  Procedure(s) Performed: Procedure(s) with comments: Lumbar two-three, Lumbar three-four, Lumbar four-five Posterior lumbar interbody fusion/expedium screws/sabre cages (N/A) - Lumbar two-three, Lumbar three-four, Lumbar four-five Posterior lumbar interbody fusion/expedium screws/sabre cages  Patient Location: PACU  Anesthesia Type:General  Level of Consciousness: awake, alert , oriented and patient cooperative  Airway & Oxygen Therapy: Patient Spontanous Breathing and Patient connected to nasal cannula oxygen  Post-op Assessment: Report given to PACU RN, Post -op Vital signs reviewed and stable and Patient moving all extremities  Post vital signs: Reviewed and stable  Complications: No apparent anesthesia complications

## 2013-08-14 NOTE — Anesthesia Postprocedure Evaluation (Signed)
  Anesthesia Post-op Note  Patient: [name]  Procedure(s) Performed: Procedure(s) with comments: Lumbar two-three, Lumbar three-four, Lumbar four-five Posterior lumbar interbody fusion/expedium screws/sabre cages (N/A) - Lumbar two-three, Lumbar three-four, Lumbar four-five Posterior lumbar interbody fusion/expedium screws/sabre cages  Patient Location: PACU  Anesthesia Type:General  Level of Consciousness: awake, alert , oriented and patient cooperative  Airway and Oxygen Therapy: Patient Spontanous Breathing and Patient connected to nasal cannula oxygen  Post-op Pain: mild  Post-op Assessment: Post-op Vital signs reviewed, Patient's Cardiovascular Status Stable, Respiratory Function Stable, Patent Airway, No signs of Nausea or vomiting and Pain level controlled  Post-op Vital Signs: Reviewed and stable  Complications: No apparent anesthesia complications

## 2013-08-15 MED ORDER — MORPHINE SULFATE 2 MG/ML IJ SOLN
2.0000 mg | INTRAMUSCULAR | Status: DC | PRN
  Administered 2013-08-15 – 2013-08-16 (×2): 2 mg via INTRAVENOUS
  Filled 2013-08-15 (×3): qty 1

## 2013-08-15 MED ORDER — OXYCODONE-ACETAMINOPHEN 5-325 MG/5ML PO SOLN: 5.0000 mL | ORAL | Status: DC | PRN

## 2013-08-15 MED ORDER — HYDROCODONE-ACETAMINOPHEN 5-325 MG PO TABS
1.0000 | ORAL_TABLET | ORAL | Status: DC | PRN
Start: 2013-08-15 — End: 2013-08-17
  Administered 2013-08-15 (×2): 1 via ORAL
  Administered 2013-08-16 – 2013-08-17 (×7): 2 via ORAL
  Filled 2013-08-15 (×8): qty 2
  Filled 2013-08-15: qty 1
  Filled 2013-08-15 (×2): qty 2

## 2013-08-15 NOTE — Care Management Note (Unsigned)
    Page 1 of 2   08/15/2013     4:03:33 PM   CARE MANAGEMENT NOTE 08/15/2013  Patient:  [name], [name]   Account Number:  [id number]  Date Initiated:  08/15/2013  Documentation initiated by:  Elmer Bales  Subjective/Objective Assessment:   Patient admitted for PLIF     Action/Plan:   Will follow for discharg   Anticipated DC Date:     Anticipated DC Plan:  HOME W HOME HEALTH SERVICES      DC Planning Services  CM consult      Choice offered to / List presented to:     DME arranged  [facility] - [facility]      DME agency  [facility]     HH arranged  HH-2 PT  HH-3 OT      [facility] agency  [facility]   Status of service:  Completed, signed off Medicare Important Message given?   (If response is "NO", the following Medicare IM given date fields will be blank) Date Medicare IM given:   Date Additional Medicare IM given:    Discharge Disposition:    Per UR Regulation:  Reviewed for med. necessity/level of care/duration of stay  If discussed at Long Length of Stay Meetings, dates discussed:    Comments:  08/15/13 1530 Elmer Bales RN, MSN, CM- Met with family to discuss home health needs.  Patient has chosen [employer]support/administrative officer for [facility] PT/OT. Mary with [facility] was notified and accepted referral.

## 2013-08-15 NOTE — Progress Notes (Signed)
Doing well. C/o appropriate incisional soreness. No leg pain No Numbness, tingling, weakness No Nausea /vomiting Not OOB yet  Temp:  [97.6 F (36.4 C)-99.9 F (37.7 C)] 99.9 F (37.7 C) (08/29 0549) Pulse Rate:  [56-92] 56 (08/29 0549) Resp:  [14-26] 19 (08/29 0758) BP: (100-150)/(54-79) 100/55 mmHg (08/29 0549) SpO2:  [98 %-100 %] 99 % (08/29 0758) FiO2 (%):  [75 %] 75 % (08/29 0758) Weight:  [53.025 kg (116 lb 14.4 oz)] 53.025 kg (116 lb 14.4 oz) (08/28 1435) Good strength and sensation Incision CDI  Plan: Increase activity,

## 2013-08-15 NOTE — Evaluation (Signed)
Occupational Therapy Evaluation Patient Details Name: DONZELLA CARROL MRN: [id number] DOB: 14-Sep-1946 Today's Date: 08/15/2013 Time: 1025-1100 OT Time Calculation (min): 35 min  OT Assessment / Plan / Recommendation History of present illness 67 yo female s/p PLIF 3 level ( L2-5) Posterior lumbar interbody fusion/expedium screws/sabre cages as a surgical intervention    Clinical Impression   Pt progressing well with therapy this session. Next session to focus on don/ doff brace with caregiver in supine. AE for LB and peri care to be demonstrated next session. 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    OT Assessment  Patient needs continued OT Services    Follow Up Recommendations  Home health OT    Barriers to Discharge      Equipment Recommendations  Other (comment) (RW)    Recommendations for Other Services    Frequency  Min 2X/week    Precautions / Restrictions Precautions Precautions: Fall;Back Precaution Comments: handout provided Required Braces or Orthoses: Spinal Brace Spinal Brace: Thoracolumbosacral orthotic;Applied in supine position   Pertinent Vitals/Pain Minimal discomfort with brace Pt states "it felt better on before the surgery"     ADL  Eating/Feeding: Set up Where Assessed - Eating/Feeding: Chair Lower Body Bathing: +1 Total assistance Where Assessed - Lower Body Bathing: Supported sitting Upper Body Dressing: Moderate assistance Where Assessed - Upper Body Dressing: Supported sitting Lower Body Dressing: +1 Total assistance Where Assessed - Lower Body Dressing: Supported sitting Toilet Transfer: Moderate assistance Toilet Transfer Method: Sit to stand Toilet Transfer Equipment: Raised toilet seat with arms (or 3-in-1 over toilet) Toileting - Clothing Manipulation and Hygiene: Moderate assistance Where Assessed - Toileting Clothing Manipulation and Hygiene: Sit to stand from 3-in-1 or toilet Equipment  Used: Gait belt;Back brace;Rolling walker Transfers/Ambulation Related to ADLs: Pt ambulated >700 ft during session. PT sitting on pillows to elevate surface to make it easier for sit<>Stand. Pt must widen base of support outside RW and use BIL UE to push into standing ADL Comments: Pt is able to cross bil LE pulling on ted hose but unable to reach feet at all. pt states "I could do this before surgery" pt is limited by precautions and TLSO brace. Pt provided handout for back precautions.Pt needs (A) for bed mobilty, chair sit<>stand and personal hyigene. Pt total (A) to don /doff brace    OT Diagnosis: Generalized weakness;Acute pain  OT Problem List: Decreased strength;Decreased activity tolerance;Impaired balance (sitting and/or standing);Decreased safety awareness;Decreased knowledge of use of DME or AE;Decreased knowledge of precautions;Pain OT Treatment Interventions: Self-care/ADL training;Therapeutic exercise;DME and/or AE instruction;Therapeutic activities;Patient/family education;Balance training   OT Goals(Current goals can be found in the care plan section) Acute Rehab OT Goals Patient Stated Goal: to be able to walk by myself OT Goal Formulation: With patient/family Time For Goal Achievement: 08/29/13 Potential to Achieve Goals: Good ADL Goals Pt Will Transfer to Toilet: with modified independence;bedside commode Additional ADL Goal #1: Pt with caregiver will don brace MOD I maintaining 100% back precautions Additional ADL Goal #2: Pt will perform peri care mod I with AE  Visit Information  Last OT Received On: 08/15/13 Assistance Needed: +1 PT/OT Co-Evaluation/Treatment: Yes History of Present Illness: 68 yo female s/p PLIF 3 level ( L2-5) Posterior lumbar interbody fusion/expedium screws/sabre cages as a surgical intervention        Prior Functioning     Home Living Family/patient expects to be discharged to:: Private residence Living Arrangements: Spouse/significant  other;Children Available Help  at Discharge: Family;Available 24 hours/day Type of Home: House Home Access: Level entry Home Layout: One level Home Equipment: Walker - 4 wheels;Cane - single point;Toilet riser;Grab bars - toilet;Grab bars - tub/shower Additional Comments: tub Prior Function Level of Independence: Independent with assistive device(s) Comments: Pt has toilet riser with no handles and is not on the commode at all times. Pt and family encouraged to place seat on commode.  Communication Communication: No difficulties Dominant Hand: Right         Vision/Perception Vision - History Baseline Vision: Wears glasses all the time Patient Visual Report: No change from baseline   Cognition  Cognition Arousal/Alertness: Awake/alert Behavior During Therapy: WFL for tasks assessed/performed Overall Cognitive Status: Within Functional Limits for tasks assessed    Extremity/Trunk Assessment Upper Extremity Assessment Upper Extremity Assessment: Generalized weakness (bil arthritis noted) Lower Extremity Assessment Lower Extremity Assessment: Defer to PT evaluation;RLE deficits/detail RLE Deficits / Details: pt with decr grasp due to arthritis. pt has difficulty using a reacher at this time. Pt don gown with tassle on zipper due to inability to zip gown without it Cervical / Trunk Assessment Cervical / Trunk Assessment: Normal     Mobility Bed Mobility Bed Mobility: Sit to Supine Sit to Supine: 3: Mod assist;HOB flat Details for Bed Mobility Assistance: Pt requried v/c and tacitle input to progress from EOB to supine. Pt will needed continued practice to sequence task.  Transfers Transfers: Sit to Stand;Stand to Sit Sit to Stand: 3: Mod assist;With upper extremity assist;From chair/3-in-1 Stand to Sit: With upper extremity assist;4: Min assist;To bed Details for Transfer Assistance: cues for hand placement and BIL LE placement     Exercise     Balance     End of  Session OT - End of Session Activity Tolerance: Patient tolerated treatment well Patient left: in bed;with call bell/phone within reach;with family/visitor present Nurse Communication: Mobility status;Precautions  GO     Harolyn Rutherford 08/15/2013, 1:30 PM Pager: [phone]

## 2013-08-15 NOTE — Evaluation (Signed)
Physical Therapy Evaluation Patient Details Name: [name] MRN: [id number] DOB: [birth year] Today's Date: 08/15/2013 Time: 1023-1059 PT Time Calculation (min): 36 min  PT Assessment / Plan / Recommendation History of Present Illness  67 yo female s/p PLIF 3 level ( L2-5) Posterior lumbar interbody fusion/expedium screws/sabre cages as a surgical intervention   Clinical Impression  Patient is s/p surgery resulting in the deficits listed below (see PT Problem List). Patient will benefit from skilled PT to increase their independence and safety with mobility (while adhering to their precautions) to allow discharge home with support from family. Patient with baseline lower extremity weakness and instability during gait. Will require min-modA for transfers and mingaurdA for gait at home. Family appears very supportive.       PT Assessment  Patient needs continued PT services    Follow Up Recommendations  Home health PT;Supervision/Assistance - 24 hour    Does the patient have the potential to tolerate intense rehabilitation      Barriers to Discharge        Equipment Recommendations  None recommended by PT    Recommendations for Other Services     Frequency Min 5X/week    Precautions / Restrictions Precautions Precautions: Fall;Back Precaution Comments: handout provided Required Braces or Orthoses: Spinal Brace Spinal Brace: Thoracolumbosacral orthotic;Applied in supine position   Pertinent Vitals/Pain Reports minimal pain      Mobility  Bed Mobility Bed Mobility: Sit to Supine Sit to Supine: 3: Mod assist;HOB flat Details for Bed Mobility Assistance: Pt requried v/c and tacitle input to progress from EOB to supine. Pt will needed continued practice to sequence task.  Transfers Sit to Stand: 3: Mod assist;With upper extremity assist;From chair/3-in-1 Stand to Sit: With upper extremity assist;4: Min assist;To bed Details for Transfer Assistance: cues for hand  placement and BIL LE placement; pt widens her BOS to assist with sit->stand Ambulation/Gait Ambulation/Gait Assistance: 4: Min guard Ambulation Distance (Feet): 800 Feet Assistive device: Rolling walker Ambulation/Gait Assistance Details: cues for tall posture and safe positioning within RW Stairs: No    Exercises     PT Diagnosis: Difficulty walking;Generalized weakness;Acute pain  PT Problem List: Decreased strength;Decreased activity tolerance;Decreased balance;Pain;Decreased knowledge of precautions;Decreased mobility PT Treatment Interventions: DME instruction;Gait training;Functional mobility training;Therapeutic activities;Therapeutic exercise;Patient/family education     PT Goals(Current goals can be found in the care plan section) Acute Rehab PT Goals Patient Stated Goal: to be able to walk by myself PT Goal Formulation: With patient Time For Goal Achievement: 08/22/13 Potential to Achieve Goals: Good  Visit Information  Last PT Received On: 08/15/13 Assistance Needed: +1 History of Present Illness: 67 yo female s/p PLIF 3 level ( L2-5) Posterior lumbar interbody fusion/expedium screws/sabre cages as a surgical intervention        Prior Functioning  Home Living Family/patient expects to be discharged to:: Private residence Living Arrangements: Spouse/significant other;Children Available Help at Discharge: Family;Available 24 hours/day Type of Home: House Home Access: Level entry Home Layout: One level Home Equipment: Walker - 4 wheels;Cane - single point;Toilet riser;Grab bars - toilet;Grab bars - tub/shower Additional Comments: tub Prior Function Level of Independence: Independent with assistive device(s) Comments: Pt has toilet riser with no handles and is not on the commode at all times. Pt and family encouraged to place seat on commode.  Communication Communication: No difficulties Dominant Hand: Right    Cognition  Cognition Arousal/Alertness:  Awake/alert Behavior During Therapy: WFL for tasks assessed/performed Overall Cognitive Status: Within Functional Limits for tasks assessed  Extremity/Trunk Assessment Upper Extremity Assessment Upper Extremity Assessment: Generalized weakness (bil arthritis noted) Lower Extremity Assessment Lower Extremity Assessment: Generalized weakness RLE Deficits / Details: general weakness, functional for ambulation however difficulty with concentric/eccentric contraction needing during sit<>stand Cervical / Trunk Assessment Cervical / Trunk Assessment: Normal   Balance    End of Session PT - End of Session Equipment Utilized During Treatment: Gait belt;Back brace Activity Tolerance: Patient tolerated treatment well Patient left: in bed;with call bell/phone within reach;with family/visitor present Nurse Communication: Mobility status  GP     Ludger Nutting 08/15/2013, 1:45 PM

## 2013-08-16 NOTE — Progress Notes (Signed)
[facility] to provided [facility]. Isidoro Donning RN CCM Case Mgmt phone [phone]

## 2013-08-16 NOTE — Progress Notes (Signed)
Physical Therapy Treatment Patient Details Name: [name] MRN: [id number] DOB: 1946/12/10 Today's Date: 08/16/2013 Time: 4098-1191 PT Time Calculation (min): 23 min  PT Assessment / Plan / Recommendation  History of Present Illness 67 yo female s/p PLIF 3 level ( L2-5) Posterior lumbar interbody fusion/expedium screws/sabre cages as a surgical intervention; TLSO in supine, decreased use of hands due to arthritis. Pt with prior LE weakness due to spinal stenosis. PMHx includes dysphagia (unspecified), Sjogren's syndrome, OA, ostepenia, and polymyositis.   PT Comments   Pt has been dealing with bil LE weakness for ~5 years and is at times resistant to education on a different way to perform her mobility tasks. Emphasis on need to adhere to back precautions with mobility techniques and pt understanding. Excellent progress.   Follow Up Recommendations  Home health PT;Supervision/Assistance - 24 hour     Does the patient have the potential to tolerate intense rehabilitation     Barriers to Discharge        Equipment Recommendations  None recommended by PT    Recommendations for Other Services    Frequency Min 5X/week   Progress towards PT Goals Progress towards PT goals: Progressing toward goals  Plan Current plan remains appropriate    Precautions / Restrictions Precautions Precautions: Fall;Back Required Braces or Orthoses: Spinal Brace Spinal Brace: Thoracolumbosacral orthotic;Applied in supine position Restrictions Weight Bearing Restrictions: No   Pertinent Vitals/Pain 3/10 back; refused return to bed despite lengthy time sitting up     Mobility  Bed Mobility Bed Mobility: Not assessed Transfers Transfers: Sit to Stand;Stand to Sit Sit to Stand: 4: Min assist;3: Mod assist;With upper extremity assist Stand to Sit: 4: Min guard;With upper extremity assist Details for Transfer Assistance: from recliner pt preferred her son assist her (grossly mod assist); from  elevated 3n1, min assist Ambulation/Gait Ambulation/Gait Assistance: 4: Min guard Ambulation Distance (Feet): 500 Feet Assistive device: Rolling walker Ambulation/Gait Assistance Details: vc for safe use of RW (staying closer to RW and to avoid objects); pt distracts herself with talking Gait Pattern: Step-through pattern    Exercises     PT Diagnosis:    PT Problem List:   PT Treatment Interventions:     PT Goals (current goals can now be found in the care plan section) Acute Rehab PT Goals Patient Stated Goal: to be able to walk by myself  Visit Information  Last PT Received On: 08/16/13 Assistance Needed: +1 History of Present Illness: 67 yo female s/p PLIF 3 level ( L2-5) Posterior lumbar interbody fusion/expedium screws/sabre cages as a surgical intervention; TLSO in supine, decreased use of hands due to arthritis. Pt with prior LE weakness due to spinal stenosis. PMHx includes dysphagia (unspecified), Sjogren's syndrome, OA, ostepenia, and polymyositis.    Subjective Data  Subjective: Reports she has built up chairs at home that she can stand from (with difficulty). Reports she has been unable to get up from hospital recliner (even with built up seat) without significant assistance. Patient Stated Goal: to be able to walk by myself   Cognition  Cognition Arousal/Alertness: Awake/alert Behavior During Therapy: WFL for tasks assessed/performed Overall Cognitive Status: Within Functional Limits for tasks assessed    Balance     End of Session PT - End of Session Equipment Utilized During Treatment: Gait belt;Back brace Activity Tolerance: Patient tolerated treatment well Patient left: Other (comment) (at EOB with OT to practice ADLs)   GP     Eliyah Mcshea 08/16/2013, 3:01 PM Pager [phone]

## 2013-08-16 NOTE — Progress Notes (Signed)
Occupational Therapy Treatment Patient Details Name: [name] MRN: [id number] DOB: 07-Apr-1946 Today's Date: 08/16/2013 Time: 1478-2956 OT Time Calculation (min): 42 min  OT Assessment / Plan / Recommendation  History of present illness 67 yo female s/p PLIF 3 level ( L2-5) Posterior lumbar interbody fusion/expedium screws/sabre cages as a surgical intervention; TLSO in supine, decreased use of hands due to arthritis   OT comments  Pt making progress  Follow Up Recommendations  Home health OT       Equipment Recommendations  3 in 1 bedside comode       Frequency Min 2X/week   Progress towards OT Goals Progress towards OT goals: Progressing toward goals  Plan Discharge plan remains appropriate    Precautions / Restrictions Precautions Precautions: Fall;Back Required Braces or Orthoses: Spinal Brace Spinal Brace: Thoracolumbosacral orthotic;Applied in supine position Restrictions Weight Bearing Restrictions: No       ADL  Grooming: Supervision/safety;Wash/dry hands Where Assessed - Grooming: Unsupported standing Lower Body Dressing: Minimal assistance (with AE; sock aide and dressing stick (socks)) Where Assessed - Lower Body Dressing: Unsupported sitting Toilet Transfer: Min Pension scheme manager Method: Sit to Barista: Raised toilet seat with arms (or 3-in-1 over toilet) Toileting - Clothing Manipulation and Hygiene: Min guard (Can reach back peri area when she is in standing) Where Assessed - Toileting Clothing Manipulation and Hygiene: Standing ADL Comments: Has dressing stick at home      OT Goals(current goals can now be found in the care plan section)    Visit Information  Last OT Received On: 08/16/13 Assistance Needed: +1 History of Present Illness: 67 yo female s/p PLIF 3 level ( L2-5) Posterior lumbar interbody fusion/expedium screws/sabre cages as a surgical intervention; TLSO in supine, decreased use of hands due to  arthritis          Cognition  Cognition Arousal/Alertness: Awake/alert Behavior During Therapy: WFL for tasks assessed/performed Overall Cognitive Status: Within Functional Limits for tasks assessed    Mobility  Transfers Transfers: Sit to Stand;Stand to Sit Sit to Stand: 4: Min guard;From elevated surface;With upper extremity assist;From chair/3-in-1 Stand to Sit: 4: Min guard;With upper extremity assist;To bed          End of Session OT - End of Session Equipment Utilized During Treatment: Gait belt;Rolling walker;Back brace (dressing stick and sock aid) Activity Tolerance: Patient tolerated treatment well Patient left: in chair;with call bell/phone within reach;with family/visitor present Nurse Communication: Mobility status       [name] [phone] 08/16/2013, 2:48 PM

## 2013-08-16 NOTE — Progress Notes (Signed)
Doing well. C/o appropriate incisional soreness. Less leg pain No Numbness, tingling, weakness No Nausea /vomiting Amb/ voiding well  Temp:  [98.3 F (36.8 C)-99.9 F (37.7 C)] 99.9 F (37.7 C) (08/30 0543) Pulse Rate:  [57-90] 79 (08/30 0543) Resp:  [17-20] 20 (08/30 0543) BP: (102-146)/(39-72) 146/72 mmHg (08/30 0543) SpO2:  [98 %-100 %] 98 % (08/30 0543) Good strength and sensation Incision CDI  Plan: Needs to increase activity   - anticipate home Monday/Tuesday depending on progress  - pt with swallowing difficulty and will need liquid pain meds on d/c

## 2013-08-17 MED ORDER — HYDROCODONE-ACETAMINOPHEN 7.5-325 MG/15ML PO SOLN: 10.0000 mL | ORAL | Status: DC | PRN

## 2013-08-17 MED ORDER — METHOCARBAMOL 500 MG PO TABS: 500.0000 mg | ORAL_TABLET | Freq: Four times a day (QID) | ORAL | Status: DC | PRN

## 2013-08-17 NOTE — Progress Notes (Signed)
Occupational Therapy Treatment Patient Details Name: [name] MRN: [id number] DOB: [birth year] Today's Date: 08/17/2013 Time: 0865-7846 OT Time Calculation (min): 40 min  OT Assessment / Plan / Recommendation  History of present illness 67 yo female s/p PLIF 3 level ( L2-5) Posterior lumbar interbody fusion/expedium screws/sabre cages as a surgical intervention; TLSO in supine, decreased use of hands due to arthritis. Pt with prior LE weakness due to spinal stenosis. PMHx includes dysphagia (unspecified), Sjogren's syndrome, OA, ostepenia, and polymyositis.   OT comments  Pt plans to d/c home today. Pt with mod (A) to complete sit<>stand transfer with family (A). Pt without further OT questions at this time. All DME delivered to room at this time.  Follow Up Recommendations  Home health OT    Barriers to Discharge       Equipment Recommendations  3 in 1 bedside comode    Recommendations for Other Services    Frequency Min 2X/week   Progress towards OT Goals Progress towards OT goals: Progressing toward goals  Plan Discharge plan remains appropriate    Precautions / Restrictions Precautions Precautions: Fall;Back Required Braces or Orthoses: Spinal Brace Spinal Brace: Thoracolumbosacral orthotic;Applied in supine position   Pertinent Vitals/Pain 8 out 10 Pain medication provided Pt independently mixing medication in apple sauce to swallow    ADL  Eating/Feeding: Independent Where Assessed - Eating/Feeding: Edge of bed Grooming: Wash/dry hands;Wash/dry face;Teeth care;Supervision/safety Where Assessed - Grooming: Unsupported standing Lower Body Bathing: Supervision/safety Where Assessed - Lower Body Bathing: Unsupported sit to stand Toilet Transfer: Minimal assistance Toilet Transfer Method: Sit to stand Toilet Transfer Equipment: Raised toilet seat with arms (or 3-in-1 over toilet) Toileting - Clothing Manipulation and Hygiene: Supervision/safety Where Assessed -  Toileting Clothing Manipulation and Hygiene: Sit to stand from 3-in-1 or toilet Equipment Used: Gait belt;Back brace;Rolling walker Transfers/Ambulation Related to ADLs: Pt required MOD (A) for sit<>Stand from chair. pt was unable to achieve sit<.stand with son. OT educated son and patient on sit<>Stand . Pt and family requesting information on gait belt. Educated on wear to purchase or use of a standard belt. Pt completed toilet transfer, hygiene standing and sink level grooming task. ADL Comments: Pt with extensive denture care static standing. pt has complex partial and fit due to multiple surg and bone loss. Pt reports incr pain due to holding pain medication. Pt and family educated on staying head of pain to keep mobility progressing. Pt states "i was just so afraid of getting addicted."  Pt and family without further questions at this time    OT Diagnosis:    OT Problem List:   OT Treatment Interventions:     OT Goals(current goals can now be found in the care plan section) Acute Rehab OT Goals Patient Stated Goal: to be able to walk by myself OT Goal Formulation: With patient/family Time For Goal Achievement: 08/29/13 Potential to Achieve Goals: Good ADL Goals Pt Will Transfer to Toilet: with modified independence;bedside commode Additional ADL Goal #1: Pt with caregiver will don brace MOD I maintaining 100% back precautions Additional ADL Goal #2: Pt will perform peri care mod I with AE  Visit Information  Last OT Received On: 08/17/13 Assistance Needed: +1 History of Present Illness: 67 yo female s/p PLIF 3 level ( L2-5) Posterior lumbar interbody fusion/expedium screws/sabre cages as a surgical intervention; TLSO in supine, decreased use of hands due to arthritis. Pt with prior LE weakness due to spinal stenosis. PMHx includes dysphagia (unspecified), Sjogren's syndrome, OA, ostepenia, and polymyositis.  Subjective Data      Prior Functioning       Cognition   Cognition Arousal/Alertness: Awake/alert Behavior During Therapy: WFL for tasks assessed/performed Overall Cognitive Status: Within Functional Limits for tasks assessed    Mobility  Bed Mobility Bed Mobility: Not assessed Transfers Sit to Stand: 3: Mod assist;With upper extremity assist;From chair/3-in-1 Stand to Sit: 4: Min assist;With upper extremity assist;To chair/3-in-1 Details for Transfer Assistance: elevated surface with son (A). pt requires a widen base of support to progress to standing.    Exercises      Balance     End of Session OT - End of Session Activity Tolerance: Patient tolerated treatment well Patient left: in bed;with call bell/phone within reach;with family/visitor present Nurse Communication: Mobility status  GO     Harolyn Rutherford 08/17/2013, 11:40 AM Pager: [phone]

## 2013-08-17 NOTE — Progress Notes (Signed)
   CARE MANAGEMENT NOTE 08/17/2013  Patient:  [name], [name]   Account Number:  [id number]  Date Initiated:  08/15/2013  Documentation initiated by:  Elmer Bales  Subjective/Objective Assessment:   Patient admitted for PLIF     Action/Plan:   Will follow for discharg   Anticipated DC Date:  08/17/2013   Anticipated DC Plan:  HOME W HOME HEALTH SERVICES      DC Planning Services  CM consult      [facility] Choice  HOME HEALTH   Choice offered to / List presented to:     DME arranged  WALKER - ROLLING  3-N-1      DME agency  [facility]     HH arranged  HH-2 PT  HH-3 OT  HH-5 SPEECH THERAPY      HH agency  [facility]   Status of service:  Completed, signed off Medicare Important Message given?   (If response is "NO", the following Medicare IM given date fields will be blank) Date Medicare IM given:   Date Additional Medicare IM given:    Discharge Disposition:  HOME W HOME HEALTH SERVICES  Per UR Regulation:  Reviewed for med. necessity/level of care/duration of stay  If discussed at Long Length of Stay Meetings, dates discussed:    Comments:  08/17/13 09:00  CM contacted [facility] to inform of discharge. Spoke to pt in room and pt requests ltwt walker as she is ; only 4'9"; information reported to DME who will deliver rolling walker and 3n1 to room prior to discharge.  No other CM needs communicated.  Freddy Jaksch, BSN, [place] [phone].  08/15/13 1530 Elmer Bales RN, MSN, CM- Met with family to discuss home health needs.  Patient has chosen [employer]administrative officer for [facility] PT/OT. Mary with [facility] was notified and accepted referral.

## 2013-08-17 NOTE — Discharge Summary (Signed)
Physician Discharge Summary  Patient ID: [name] MRN: [id number] DOB/AGE: Sep 01, 1946 67 y.o.  Admit date: 08/14/2013 Discharge date: 08/17/2013  Admission Diagnoses:Spondylolisthesis, Lumbar stenosis, Lumbar radiculopathy, Lumbago, scoliosis  Discharge Diagnoses: Spondylolisthesis, Lumbar stenosis, Lumbar radiculopathy, Lumbago, scoliosis  Active Problems:   * No active hospital problems. *   Discharged Condition: good  Hospital Course: pt admitted and underwent procedure below  - pt up ambulating  - doing well - less leg pain - voiding, tolerating PO  Consults: None    Treatments: surgery: Decompressive laminectomy decompressing the L2, L3, L4 ,L5 roots (4 levels ), PLIF  Lumbar two-three, Lumbar three-four, Lumbar four-five ( 3 levels) with interbody cages At L2-3, L3-4 , L4-5 ( 3 levels) , expedium segmented pedicle screw fixation L2-5 , Autograft , Allograft , bone marrow aspirate      Discharge Exam: Blood pressure 110/47, pulse 62, temperature 98.3 F (36.8 C), temperature source Oral, resp. rate 20, height 4\' 9"  (1.448 m), weight 53.025 kg (116 lb 14.4 oz), SpO2 100.00%. Wound:c/d/i  Disposition: home     Medication List         aspirin 81 MG chewable tablet  Chew 81 mg by mouth daily.     CALCIUM 600+D PLUS MINERALS 600-400 MG-UNIT Chew  Chew 2 tablets by mouth daily.     HYDROcodone-acetaminophen 7.5-325 mg/15 ml solution  Commonly known as:  HYCET  Take 10-20 mLs by mouth every 4 (four) hours as needed for pain.     hydroxychloroquine 200 MG tablet  Commonly known as:  PLAQUENIL  Take 200 mg by mouth 2 (two) times daily.     methocarbamol 500 MG tablet  Commonly known as:  ROBAXIN  Take 1 tablet (500 mg total) by mouth every 6 (six) hours as needed.     olmesartan 40 MG tablet  Commonly known as:  BENICAR  Take 40 mg by mouth daily.     omeprazole 20 MG capsule  Commonly known as:  PRILOSEC  Take 20 mg by mouth 2 (two) times daily.      timolol 0.5 % ophthalmic solution  Commonly known as:  BETIMOL  Place 1 drop into both eyes at bedtime.     traMADol 50 MG tablet  Commonly known as:  ULTRAM  Take 50 mg by mouth daily as needed for pain.           Follow-up Information   Follow up with [facility]. (Home Health Physical Therapy and Occupational Therapy)    Contact information:   [phone]      Signed: [name], MD 08/17/2013, 7:40 AM

## 2013-08-19 MED FILL — Sodium Chloride IV Soln 0.9%: INTRAVENOUS | Qty: 1000 | Status: AC

## 2013-08-19 MED FILL — Heparin Sodium (Porcine) Inj 1000 Unit/ML: INTRAMUSCULAR | Qty: 30 | Status: AC

## 2013-09-08 ENCOUNTER — Inpatient Hospital Stay (HOSPITAL_COMMUNITY): Payer: Medicare Other

## 2013-09-08 ENCOUNTER — Encounter (HOSPITAL_COMMUNITY): Payer: Self-pay | Admitting: Emergency Medicine

## 2013-09-08 ENCOUNTER — Inpatient Hospital Stay (HOSPITAL_COMMUNITY)
Admission: EM | Admit: 2013-09-08 | Discharge: 2013-09-10 | DRG: 439 | Disposition: A | Discharge status: Home Health | Payer: Medicare Other | Attending: Internal Medicine | Admitting: Internal Medicine

## 2013-09-08 DIAGNOSIS — M8580 Other specified disorders of bone density and structure, unspecified site: Secondary | ICD-10-CM

## 2013-09-08 DIAGNOSIS — R198 Other specified symptoms and signs involving the digestive system and abdomen: Secondary | ICD-10-CM

## 2013-09-08 DIAGNOSIS — D509 Iron deficiency anemia, unspecified: Secondary | ICD-10-CM

## 2013-09-08 DIAGNOSIS — R531 Weakness: Secondary | ICD-10-CM

## 2013-09-08 DIAGNOSIS — D649 Anemia, unspecified: Secondary | ICD-10-CM | POA: Diagnosis present

## 2013-09-08 DIAGNOSIS — R748 Abnormal levels of other serum enzymes: Secondary | ICD-10-CM

## 2013-09-08 DIAGNOSIS — R110 Nausea: Secondary | ICD-10-CM | POA: Diagnosis present

## 2013-09-08 DIAGNOSIS — IMO0002 Reserved for concepts with insufficient information to code with codable children: Secondary | ICD-10-CM

## 2013-09-08 DIAGNOSIS — T83511A Infection and inflammatory reaction due to indwelling urethral catheter, initial encounter: Secondary | ICD-10-CM | POA: Diagnosis present

## 2013-09-08 DIAGNOSIS — R519 Headache, unspecified: Secondary | ICD-10-CM

## 2013-09-08 DIAGNOSIS — M3500 Sicca syndrome, unspecified: Secondary | ICD-10-CM | POA: Diagnosis present

## 2013-09-08 DIAGNOSIS — H409 Unspecified glaucoma: Secondary | ICD-10-CM | POA: Diagnosis present

## 2013-09-08 DIAGNOSIS — M1990 Unspecified osteoarthritis, unspecified site: Secondary | ICD-10-CM | POA: Diagnosis present

## 2013-09-08 DIAGNOSIS — N814 Uterovaginal prolapse, unspecified: Secondary | ICD-10-CM

## 2013-09-08 DIAGNOSIS — K219 Gastro-esophageal reflux disease without esophagitis: Secondary | ICD-10-CM | POA: Diagnosis present

## 2013-09-08 DIAGNOSIS — H402290 Chronic angle-closure glaucoma, unspecified eye, stage unspecified: Secondary | ICD-10-CM

## 2013-09-08 DIAGNOSIS — I10 Essential (primary) hypertension: Secondary | ICD-10-CM | POA: Diagnosis present

## 2013-09-08 DIAGNOSIS — N816 Rectocele: Secondary | ICD-10-CM

## 2013-09-08 DIAGNOSIS — Y846 Urinary catheterization as the cause of abnormal reaction of the patient, or of later complication, without mention of misadventure at the time of the procedure: Secondary | ICD-10-CM | POA: Diagnosis present

## 2013-09-08 DIAGNOSIS — D519 Vitamin B12 deficiency anemia, unspecified: Secondary | ICD-10-CM | POA: Diagnosis present

## 2013-09-08 DIAGNOSIS — Z7982 Long term (current) use of aspirin: Secondary | ICD-10-CM

## 2013-09-08 DIAGNOSIS — R1310 Dysphagia, unspecified: Secondary | ICD-10-CM

## 2013-09-08 DIAGNOSIS — I7300 Raynaud's syndrome without gangrene: Secondary | ICD-10-CM | POA: Diagnosis present

## 2013-09-08 DIAGNOSIS — E871 Hypo-osmolality and hyponatremia: Secondary | ICD-10-CM | POA: Diagnosis present

## 2013-09-08 DIAGNOSIS — E860 Dehydration: Secondary | ICD-10-CM | POA: Diagnosis present

## 2013-09-08 DIAGNOSIS — D539 Nutritional anemia, unspecified: Secondary | ICD-10-CM

## 2013-09-08 DIAGNOSIS — N390 Urinary tract infection, site not specified: Secondary | ICD-10-CM | POA: Diagnosis present

## 2013-09-08 DIAGNOSIS — K859 Acute pancreatitis without necrosis or infection, unspecified: Principal | ICD-10-CM | POA: Diagnosis present

## 2013-09-08 DIAGNOSIS — E876 Hypokalemia: Secondary | ICD-10-CM | POA: Diagnosis present

## 2013-09-08 DIAGNOSIS — R509 Fever, unspecified: Secondary | ICD-10-CM | POA: Diagnosis present

## 2013-09-08 DIAGNOSIS — A498 Other bacterial infections of unspecified site: Secondary | ICD-10-CM | POA: Diagnosis present

## 2013-09-08 DIAGNOSIS — Z79899 Other long term (current) drug therapy: Secondary | ICD-10-CM

## 2013-09-08 DIAGNOSIS — R51 Headache: Secondary | ICD-10-CM

## 2013-09-08 DIAGNOSIS — Z981 Arthrodesis status: Secondary | ICD-10-CM

## 2013-09-08 HISTORY — DX: Pure hypercholesterolemia, unspecified: E78.00

## 2013-09-08 HISTORY — DX: Family history of other specified conditions: Z84.89

## 2013-09-08 HISTORY — DX: Low back pain: M54.5

## 2013-09-08 HISTORY — DX: Other chronic pain: G89.29

## 2013-09-08 HISTORY — DX: Migraine, unspecified, not intractable, without status migrainosus: G43.909

## 2013-09-08 HISTORY — DX: Nontoxic multinodular goiter: E04.2

## 2013-09-08 HISTORY — DX: Low back pain, unspecified: M54.50

## 2013-09-08 LAB — COMPREHENSIVE METABOLIC PANEL
ALT: 10 U/L (ref 0–35)
AST: 23 U/L (ref 0–37)
CO2: 23 mEq/L (ref 19–32)
Chloride: 97 mEq/L (ref 96–112)
Creatinine, Ser: 0.44 mg/dL — ABNORMAL LOW (ref 0.50–1.10)
GFR calc non Af Amer: >90 mL/min (ref >90)
Glucose, Bld: 89 mg/dL (ref 70–99)
Sodium: 131 mEq/L — ABNORMAL LOW (ref 135–145)
Total Bilirubin: 0.5 mg/dL (ref 0.3–1.2)

## 2013-09-08 LAB — URINE MICROSCOPIC-ADD ON

## 2013-09-08 LAB — CBC WITH DIFFERENTIAL/PLATELET
Basophils Absolute: 0 10*3/uL (ref 0.0–0.1)
HCT: 32.9 % — ABNORMAL LOW (ref 36.0–46.0)
Lymphocytes Relative: 5 % — ABNORMAL LOW (ref 12–46)
Lymphs Abs: 0.3 10*3/uL — ABNORMAL LOW (ref 0.7–4.0)
Monocytes Absolute: 0.3 10*3/uL (ref 0.1–1.0)
Neutro Abs: 5.5 10*3/uL (ref 1.7–7.7)
RBC: 3.71 MIL/uL — ABNORMAL LOW (ref 3.87–5.11)
RDW: 14.1 % (ref 11.5–15.5)
WBC: 6.1 10*3/uL (ref 4.0–10.5)

## 2013-09-08 LAB — CBC
MCH: 31 pg (ref 26.0–34.0)
MCV: 88.4 fL (ref 78.0–100.0)
Platelets: 267 10*3/uL (ref 150–400)
RBC: 3.45 MIL/uL — ABNORMAL LOW (ref 3.87–5.11)
RDW: 14.1 % (ref 11.5–15.5)
WBC: 7.4 10*3/uL (ref 4.0–10.5)

## 2013-09-08 LAB — URINALYSIS, ROUTINE W REFLEX MICROSCOPIC
Bilirubin Urine: NEGATIVE
Ketones, ur: NEGATIVE mg/dL
Protein, ur: 30 mg/dL — AB
Urobilinogen, UA: 2 mg/dL — ABNORMAL HIGH (ref 0.0–1.0)

## 2013-09-08 LAB — CREATININE, SERUM
Creatinine, Ser: 0.5 mg/dL (ref 0.50–1.10)
GFR calc Af Amer: >90 mL/min (ref >90)

## 2013-09-08 MED ORDER — TIMOLOL MALEATE 0.5 % OP SOLN
1.0000 [drp] | Freq: Every day | OPHTHALMIC | Status: DC
  Administered 2013-09-08 – 2013-09-09 (×2): 1 [drp] via OPHTHALMIC
  Filled 2013-09-08: qty 5

## 2013-09-08 MED ORDER — ENOXAPARIN SODIUM 40 MG/0.4ML ~~LOC~~ SOLN
40.0000 mg | SUBCUTANEOUS | Status: DC
  Administered 2013-09-08 – 2013-09-09 (×2): 40 mg via SUBCUTANEOUS
  Filled 2013-09-08 (×4): qty 0.4

## 2013-09-08 MED ORDER — ACETAMINOPHEN 650 MG RE SUPP
650.0000 mg | Freq: Once | RECTAL | Status: AC
Start: 2013-09-08 — End: 2013-09-08
  Administered 2013-09-08: 650 mg via RECTAL
  Filled 2013-09-08: qty 1

## 2013-09-08 MED ORDER — DEXTROSE 5 % IV SOLN
1.0000 g | Freq: Once | INTRAVENOUS | Status: AC
  Administered 2013-09-08: 1 g via INTRAVENOUS
  Filled 2013-09-08: qty 10

## 2013-09-08 MED ORDER — ONDANSETRON HCL 4 MG/2ML IJ SOLN
4.0000 mg | Freq: Four times a day (QID) | INTRAMUSCULAR | Status: DC | PRN
  Administered 2013-09-08: 4 mg via INTRAVENOUS
  Filled 2013-09-08: qty 2

## 2013-09-08 MED ORDER — IRBESARTAN 300 MG PO TABS
300.0000 mg | ORAL_TABLET | Freq: Every day | ORAL | Status: DC
  Filled 2013-09-08 (×3): qty 1

## 2013-09-08 MED ORDER — CEFTRIAXONE SODIUM 1 G IJ SOLR: 1.0000 g | INTRAMUSCULAR | Status: DC

## 2013-09-08 MED ORDER — SODIUM CHLORIDE 0.9 % IV BOLUS (SEPSIS)
1000.0000 mL | Freq: Once | INTRAVENOUS | Status: AC
  Administered 2013-09-08: 1000 mL via INTRAVENOUS

## 2013-09-08 MED ORDER — MORPHINE SULFATE 2 MG/ML IJ SOLN: 2.0000 mg | INTRAMUSCULAR | Status: DC | PRN

## 2013-09-08 MED ORDER — POTASSIUM CHLORIDE IN NACL 20-0.9 MEQ/L-% IV SOLN
INTRAVENOUS | Status: DC
  Administered 2013-09-08 – 2013-09-09 (×4): via INTRAVENOUS
  Filled 2013-09-08 (×10): qty 1000

## 2013-09-08 MED ORDER — TIMOLOL HEMIHYDRATE 0.5 % OP SOLN: 1.0000 [drp] | Freq: Every day | OPHTHALMIC | Status: DC

## 2013-09-08 MED ORDER — ONDANSETRON HCL 4 MG PO TABS
4.0000 mg | ORAL_TABLET | Freq: Four times a day (QID) | ORAL | Status: DC | PRN
  Filled 2013-09-08: qty 1

## 2013-09-08 MED ORDER — ASPIRIN 81 MG PO CHEW
81.0000 mg | CHEWABLE_TABLET | Freq: Every day | ORAL | Status: DC
  Administered 2013-09-08 – 2013-09-10 (×3): 81 mg via ORAL
  Filled 2013-09-08 (×3): qty 1

## 2013-09-08 MED ORDER — PANTOPRAZOLE SODIUM 40 MG IV SOLR
40.0000 mg | INTRAVENOUS | Status: AC
  Administered 2013-09-08 – 2013-09-10 (×3): 40 mg via INTRAVENOUS
  Filled 2013-09-08 (×3): qty 40

## 2013-09-08 MED ORDER — SODIUM CHLORIDE 0.9 % IV SOLN
INTRAVENOUS | Status: AC
  Administered 2013-09-08: 13:00:00 via INTRAVENOUS

## 2013-09-08 MED ORDER — DEXTROSE 5 % IV SOLN
1.0000 g | INTRAVENOUS | Status: DC
  Administered 2013-09-09 – 2013-09-10 (×2): 1 g via INTRAVENOUS
  Filled 2013-09-08 (×2): qty 10

## 2013-09-08 MED ORDER — HYDROCODONE-ACETAMINOPHEN 7.5-325 MG/15ML PO SOLN
10.0000 mL | ORAL | Status: DC | PRN
  Administered 2013-09-08 – 2013-09-09 (×4): 15 mL via ORAL
  Administered 2013-09-10: 10 mL via ORAL
  Filled 2013-09-08 (×5): qty 15

## 2013-09-08 MED ORDER — ACETAMINOPHEN 650 MG RE SUPP
650.0000 mg | Freq: Once | RECTAL | Status: AC
  Administered 2013-09-08: 650 mg via RECTAL
  Filled 2013-09-08: qty 1

## 2013-09-08 MED ORDER — ACETAMINOPHEN 160 MG/5ML PO SOLN
650.0000 mg | Freq: Once | ORAL | Status: DC
  Filled 2013-09-08: qty 20.3

## 2013-09-08 MED ORDER — ACETAMINOPHEN 650 MG RE SUPP
650.0000 mg | Freq: Four times a day (QID) | RECTAL | Status: DC | PRN
  Administered 2013-09-10: 650 mg via RECTAL
  Filled 2013-09-08 (×2): qty 1

## 2013-09-08 NOTE — ED Notes (Signed)
Pt placed on bedpan

## 2013-09-08 NOTE — ED Notes (Signed)
Report given to [facility], Charity fundraiser. Nurse has no further questions upon report. Pt being prepared for transport to floor via Fayrene Fearing, EMT.

## 2013-09-08 NOTE — H&P (Signed)
History and Physical Examination  [name]:[id number] DOB: Apr 28, 1946 DOA: 09/08/2013  Referring physician: Loretha Marshall PCP: Sabrina Moh, MD  Specialists: [name], surgeon  Chief Complaint: abdominal pain  HPI: [name] is a 67 y.o. female who recently had a spinal fusion surgery approximately 3 weeks ago presented to the emergency department today because she reports she developed fever chills diarrhea and abdominal pain.  She reports that for the past several weeks she's been having nausea and dry heaves.  She reports that she also has noticed burning with urination and a burning sensation in her bladder.  She reports that she spoke with her primary care provider who provided her with Bactrim to treat the urinary infection.  However, patient says she did not tolerate the medication and she could not keep it down.  She reports that it caused her to have worsening diarrhea and abdominal pain and nausea.  She also reports that she tried to take AZO tablets but could not keep them down.  She was seen in the emergency department today and found to be clinically dehydrated.  She also was found to have an elevated lipase level.  She had a urinalysis that was abnormal consistent with infection.  The patient reports that she did have a Foley catheter in place when she had her back surgery and also reports that she had to have several in and out caths done during that hospitalization for the surgery.  The patient was found to be febrile with a fever of 101.  The patient says that she recently saw her spinal surgeon for a postop checkup and was told that she is healing well from a surgical standpoint.  She reports that her wound is healing well.  She reports that her back pain has improved considerably.  She is still wearing a back brace and will continue to do so until cleared by surgery.  She says that she had been ambulating at home with her back brace.  This hospitalization was requested for  further evaluation and management.  Past Medical History Past Medical History  Diagnosis Date  . HTN (hypertension)   . Glaucoma   . Sjogren's syndrome   . Raynaud phenomenon   . Osteopenia   . OA (osteoarthritis)   . Polymyositis   . GERD (gastroesophageal reflux disease)   . Swallowing difficulty     unable to swallow whole pills  . AAA (abdominal aortic aneurysm)     small      report in epic 11/11 states no aaa    . Iron deficiency anemia     hx  . Unspecified deficiency anemia 03/14/2013    Past Surgical History Past Surgical History  Procedure Laterality Date  . Cataract extraction Bilateral 12  . Esophageal dilation  11  . Repair zenker's diverticula  02/2010  . Dental surgery      dental implants       Recent lumbar spinal fusion  Home Meds: Prior to Admission medications   Medication Sig Start Date End Date Taking? Authorizing Provider  aspirin 81 MG chewable tablet Chew 81 mg by mouth daily.   Yes Historical Provider, MD  Calcium Carbonate-Vit D-Min (CALCIUM 600+D PLUS MINERALS) 600-400 MG-UNIT CHEW Chew 2 tablets by mouth daily.   Yes Historical Provider, MD  HYDROcodone-acetaminophen (HYCET) 7.5-325 mg/15 ml solution Take 10-20 mLs by mouth every 4 (four) hours as needed for pain. 08/17/13 08/17/14 Yes [name], MD  olmesartan (BENICAR) 40 MG tablet Take  40 mg by mouth daily.   Yes Historical Provider, MD  omeprazole (PRILOSEC) 20 MG capsule Take 20 mg by mouth 2 (two) times daily.    Yes Historical Provider, MD  phenazopyridine (PYRIDIUM) 95 MG tablet Take 190 mg by mouth 3 (three) times daily as needed for pain.   Yes Historical Provider, MD  sulfamethoxazole-trimethoprim (BACTRIM,SEPTRA) 200-40 MG/5ML suspension Take 20 mLs by mouth 2 (two) times daily. For 5 days (starting 08/25/13) 08/25/13  Yes Historical Provider, MD  timolol (BETIMOL) 0.5 % ophthalmic solution Place 1 drop into both eyes at bedtime.   Yes Historical Provider, MD  traMADol (ULTRAM) 50 MG  tablet Take 50 mg by mouth daily as needed for pain.   Yes Historical Provider, MD    Allergies: Review of patient's allergies indicates no known allergies.  Social History:  History   Social History  . Marital Status: Married    Spouse Name: N/A    Number of Children: N/A  . Years of Education: N/A   Occupational History  . Not on file.   Social History Main Topics  . Smoking status: Never Smoker   . Smokeless tobacco: Never Used  . Alcohol Use: No  . Drug Use: No  . Sexual Activity: Yes    Birth Control/ Protection: Post-menopausal   Other Topics Concern  . Not on file   Social History Narrative  . No narrative on file   Family History:  Family History  Problem Relation Age of Onset  . Cancer Mother     ovarian  . Ovarian cancer Mother   . Cancer Father     colon and breast  . Hypertension Father   . Heart disease Father   . Breast cancer Father     Age 11  . Colon cancer Father   . Psoriasis Sister   . Sarcoidosis Sister   . Arthritis Sister     psoriatic arthritis  . Diabetes Sister   . Arthritis Maternal Uncle     rheumatiod arthritis  . Diabetes Son   . Heart disease Brother     Review of Systems: The patient denies anorexia,  weight loss, vision loss, decreased hearing, hoarseness, chest pain, syncope, dyspnea on exertion,  balance deficits, hemoptysis, abdominal pain, melena, hematochezia, severe indigestion/heartburn, hematuria, incontinence, genital sores, muscle weakness, suspicious skin lesions, transient blindness, difficulty walking, depression, unusual weight change, abnormal bleeding, enlarged lymph nodes, angioedema, and breast masses.  All other systems reviewed and reported as negative.   Physical Exam: Blood pressure 102/46, pulse 97, temperature 101.7 F (38.7 C), temperature source Oral, resp. rate 21, SpO2 96.00%. General appearance: alert, cooperative, appears stated age and no distress Head: Normocephalic, without obvious  abnormality, atraumatic Eyes: negative, conjunctivae/corneas clear. PERRL, EOM's intact. Fundi benign. Nose: Nares normal. Septum midline. Mucosa normal. No drainage or sinus tenderness. Throat: Mucous membranes, edentulous Neck: no adenopathy, no carotid bruit, no JVD, supple, symmetrical, trachea midline and thyroid not enlarged, symmetric, no tenderness/mass/nodules Back: Wound appears to be healing well Lungs: clear to auscultation bilaterally and normal percussion bilaterally Heart: S1, S2 normal and Tachycardic rate Abdomen: Soft, mild epigastric tenderness with palpation, normal bowel sounds, no guarding or rebound tenderness, mild suprapubic tenderness with palpation Extremities: Bilateral trace ankle edema Pulses: 2+ and symmetric Skin: Skin color, texture, turgor normal. No rashes or lesions Lymph nodes: Cervical, supraclavicular, and axillary nodes normal. Neurologic: Grossly normal  Lab  And Imaging results:  Results for orders placed during the hospital encounter of 09/08/13 (  from the past 24 hour(s))  CBC WITH DIFFERENTIAL     Status: Abnormal   Collection Time    09/08/13 10:06 AM      Result Value Range   WBC 6.1  4.0 - 10.5 K/uL   RBC 3.71 (*) 3.87 - 5.11 MIL/uL   Hemoglobin 11.5 (*) 12.0 - 15.0 g/dL   HCT 16.1 (*) 09.6 - 04.5 %   MCV 88.7  78.0 - 100.0 fL   MCH 31.0  26.0 - 34.0 pg   MCHC 35.0  30.0 - 36.0 g/dL   RDW 40.9  81.1 - 91.4 %   Platelets 283  150 - 400 K/uL   Neutrophils Relative % 91 (*) 43 - 77 %   Neutro Abs 5.5  1.7 - 7.7 K/uL   Lymphocytes Relative 5 (*) 12 - 46 %   Lymphs Abs 0.3 (*) 0.7 - 4.0 K/uL   Monocytes Relative 4  3 - 12 %   Monocytes Absolute 0.3  0.1 - 1.0 K/uL   Eosinophils Relative 0  0 - 5 %   Eosinophils Absolute 0.0  0.0 - 0.7 K/uL   Basophils Relative 0  0 - 1 %   Basophils Absolute 0.0  0.0 - 0.1 K/uL  COMPREHENSIVE METABOLIC PANEL     Status: Abnormal   Collection Time    09/08/13 10:06 AM      Result Value Range    Sodium 131 (*) 135 - 145 mEq/L   Potassium 3.3 (*) 3.5 - 5.1 mEq/L   Chloride 97  96 - 112 mEq/L   CO2 23  19 - 32 mEq/L   Glucose, Bld 89  70 - 99 mg/dL   BUN 8  6 - 23 mg/dL   Creatinine, Ser 7.82 (*) 0.50 - 1.10 mg/dL   Calcium 9.0  8.4 - 95.6 mg/dL   Total Protein 5.9 (*) 6.0 - 8.3 g/dL   Albumin 3.0 (*) 3.5 - 5.2 g/dL   AST 23  0 - 37 U/L   ALT 10  0 - 35 U/L   Alkaline Phosphatase 122 (*) 39 - 117 U/L   Total Bilirubin 0.5  0.3 - 1.2 mg/dL   GFR calc non Af Amer >90  >90 mL/min   GFR calc Af Amer >90  >90 mL/min  LIPASE, BLOOD     Status: Abnormal   Collection Time    09/08/13 10:06 AM      Result Value Range   Lipase 191 (*) 11 - 59 U/L  LACTIC ACID, PLASMA     Status: None   Collection Time    09/08/13 10:06 AM      Result Value Range   Lactic Acid, Venous 2.0  0.5 - 2.2 mmol/L  URINALYSIS, ROUTINE W REFLEX MICROSCOPIC     Status: Abnormal   Collection Time    09/08/13 10:15 AM      Result Value Range   Color, Urine AMBER (*) YELLOW   APPearance CLOUDY (*) CLEAR   Specific Gravity, Urine 1.015  1.005 - 1.030   pH 7.0  5.0 - 8.0   Glucose, UA 100 (*) NEGATIVE mg/dL   Hgb urine dipstick TRACE (*) NEGATIVE   Bilirubin Urine NEGATIVE  NEGATIVE   Ketones, ur NEGATIVE  NEGATIVE mg/dL   Protein, ur 30 (*) NEGATIVE mg/dL   Urobilinogen, UA 2.0 (*) 0.0 - 1.0 mg/dL   Nitrite POSITIVE (*) NEGATIVE   Leukocytes, UA SMALL (*) NEGATIVE  URINE MICROSCOPIC-ADD ON  Status: Abnormal   Collection Time    09/08/13 10:15 AM      Result Value Range   Squamous Epithelial / LPF RARE  RARE   WBC, UA 21-50  <3 WBC/hpf   RBC / HPF 3-6  <3 RBC/hpf   Bacteria, UA FEW (*) RARE     Impression / Plan Pancreatitis /Elevated lipase The patient presents with epigastric abdominal pain, fever and dry heaves.  She is clinically dehydrated.  She has an elevated lipase level of 191.  She will be started on IV fluids.  N.p.o. for now.  Nausea medicines as needed.  IV pain medication.  IV  Protonix.  Follow lipase levels.  UTI (lower urinary tract infection) Await the results of the urine culture Started patient on IV ceftriaxone Unfortunately she received a dose of IV antibiotics prior to receiving blood cultures I have ordered for blood cultures to be collected x2. Check lactic acid level  Weakness  PT consult     Dehydration Aggressive IV fluid hydration has been ordered    S/P lumbar spinal fusion Patient is to continue back brace for any ambulation  Fever Acetaminophen as needed Blood cultures Lactic acid level  Nausea IV nausea medications ordered  Headache No physical exam findings of meningismus Follow closely  Hypokalemia /   Hyponatremia IV fluid hydration ordered with potassium Monitor electrolytes    Anemia Monitor CBC  Difficulty swallowing pills Will attempt to order liquid medication or IV medication as patient cannot tolerate tablets.  Standley Dakins MD [facility] [place], [place] [phone] 09/08/2013, 2:48 PM

## 2013-09-08 NOTE — ED Notes (Signed)
Oral care provided for patient with mouth swabs and Vaseline.

## 2013-09-08 NOTE — ED Notes (Signed)
Just had back surgery 3 weeks ago and today woke up w/ chills fever and shaking has had diarrhea also took some uti pills but vomited themm up

## 2013-09-08 NOTE — ED Provider Notes (Signed)
CSN: [id number]     Arrival date & time 09/08/13  8295 History   First MD Initiated Contact with Patient 09/08/13 [phone]     Chief Complaint  Patient presents with  . Emesis   (Consider location/radiation/quality/duration/timing/severity/associated sxs/prior Treatment) Patient is a 67 y.o. female presenting with abdominal pain.  Abdominal Pain Pain location:  Epigastric Pain quality: aching   Pain radiates to:  Does not radiate Pain severity:  Moderate Onset quality:  Gradual Duration:  12 hours Timing:  Constant Progression:  Unchanged Chronicity:  New Relieved by:  Nothing Ineffective treatments: tried leftover antibiotics. Associated symptoms: diarrhea, dysuria, nausea and vomiting   Associated symptoms: no chest pain, no cough, no fever and no shortness of breath     Past Medical History  Diagnosis Date  . HTN (hypertension)   . Glaucoma   . Sjogren's syndrome   . Raynaud phenomenon   . Osteopenia   . OA (osteoarthritis)   . Polymyositis   . GERD (gastroesophageal reflux disease)   . Swallowing difficulty     unable to swallow whole pills  . AAA (abdominal aortic aneurysm)     small      report in epic 11/11 states no aaa    . Iron deficiency anemia     hx  . Unspecified deficiency anemia 03/14/2013   Past Surgical History  Procedure Laterality Date  . Cataract extraction Bilateral 12  . Esophageal dilation  11  . Repair zenker's diverticula  02/2010  . Dental surgery      dental implants   Family History  Problem Relation Age of Onset  . Cancer Mother     ovarian  . Ovarian cancer Mother   . Cancer Father     colon and breast  . Hypertension Father   . Heart disease Father   . Breast cancer Father     Age 2  . Colon cancer Father   . Psoriasis Sister   . Sarcoidosis Sister   . Arthritis Sister     psoriatic arthritis  . Diabetes Sister   . Arthritis Maternal Uncle     rheumatiod arthritis  . Diabetes Son   . Heart disease Brother    History   Substance Use Topics  . Smoking status: Never Smoker   . Smokeless tobacco: Never Used  . Alcohol Use: No   OB History   Grav Para Term Preterm Abortions TAB SAB Ect Mult Living   4 4 4       4      Review of Systems  Constitutional: Negative for fever.  HENT: Negative for congestion.   Respiratory: Negative for cough and shortness of breath.   Cardiovascular: Negative for chest pain.  Gastrointestinal: Positive for nausea, vomiting, abdominal pain and diarrhea.  Genitourinary: Positive for dysuria.  All other systems reviewed and are negative.    Allergies  Review of patient's allergies indicates no known allergies.  Home Medications   Current Outpatient Rx  Name  Route  Sig  Dispense  Refill  . aspirin 81 MG chewable tablet   Oral   Chew 81 mg by mouth daily.         . Calcium Carbonate-Vit D-Min (CALCIUM 600+D PLUS MINERALS) 600-400 MG-UNIT CHEW   Oral   Chew 2 tablets by mouth daily.         Marland Kitchen HYDROcodone-acetaminophen (HYCET) 7.5-325 mg/15 ml solution   Oral   Take 10-20 mLs by mouth every 4 (four) hours as needed for  pain.   473 mL   0   . olmesartan (BENICAR) 40 MG tablet   Oral   Take 40 mg by mouth daily.         Marland Kitchen omeprazole (PRILOSEC) 20 MG capsule   Oral   Take 20 mg by mouth 2 (two) times daily.          . phenazopyridine (PYRIDIUM) 95 MG tablet   Oral   Take 190 mg by mouth 3 (three) times daily as needed for pain.         Marland Kitchen sulfamethoxazole-trimethoprim (BACTRIM,SEPTRA) 200-40 MG/5ML suspension   Oral   Take 20 mLs by mouth 2 (two) times daily. For 5 days (starting 08/25/13)         . timolol (BETIMOL) 0.5 % ophthalmic solution   Both Eyes   Place 1 drop into both eyes at bedtime.         . traMADol (ULTRAM) 50 MG tablet   Oral   Take 50 mg by mouth daily as needed for pain.          BP 132/64  Pulse 103  Temp(Src) 101.3 F (38.5 C) (Oral)  Resp 23  SpO2 98% Physical Exam  Nursing note and vitals  reviewed. Constitutional: She is oriented to person, place, and time. She appears well-developed and well-nourished. No distress.  HENT:  Head: Normocephalic and atraumatic.  Mouth/Throat: Oropharynx is clear and moist.  Eyes: Conjunctivae are normal. Pupils are equal, round, and reactive to light. No scleral icterus.  Neck: Neck supple.  Cardiovascular: Normal rate, regular rhythm, normal heart sounds and intact distal pulses.   No murmur heard. Pulmonary/Chest: Effort normal and breath sounds normal. No stridor. No respiratory distress. She has no rales.  Abdominal: Soft. Bowel sounds are normal. She exhibits no distension. There is tenderness in the epigastric area. There is no rigidity, no rebound and no guarding.  Musculoskeletal: Normal range of motion.       Lumbar back: She exhibits no tenderness, no bony tenderness, no swelling and no edema (no erythema).  Neurological: She is alert and oriented to person, place, and time.  Normal BLE strength and sensation  Skin: Skin is warm and dry. No rash noted.  Psychiatric: She has a normal mood and affect. Her behavior is normal.    ED Course  Procedures (including critical care time) Labs Review Labs Reviewed  CBC WITH DIFFERENTIAL - Abnormal; Notable for the following:    RBC 3.71 (*)    Hemoglobin 11.5 (*)    HCT 32.9 (*)    Neutrophils Relative % 91 (*)    Lymphocytes Relative 5 (*)    Lymphs Abs 0.3 (*)    All other components within normal limits  COMPREHENSIVE METABOLIC PANEL - Abnormal; Notable for the following:    Sodium 131 (*)    Potassium 3.3 (*)    Creatinine, Ser 0.44 (*)    Total Protein 5.9 (*)    Albumin 3.0 (*)    Alkaline Phosphatase 122 (*)    All other components within normal limits  URINALYSIS, ROUTINE W REFLEX MICROSCOPIC - Abnormal; Notable for the following:    Color, Urine AMBER (*)    APPearance CLOUDY (*)    Glucose, UA 100 (*)    Hgb urine dipstick TRACE (*)    Protein, ur 30 (*)     Urobilinogen, UA 2.0 (*)    Nitrite POSITIVE (*)    Leukocytes, UA SMALL (*)    All other  components within normal limits  LIPASE, BLOOD - Abnormal; Notable for the following:    Lipase 191 (*)    All other components within normal limits  URINE MICROSCOPIC-ADD ON - Abnormal; Notable for the following:    Bacteria, UA FEW (*)    All other components within normal limits  LACTIC ACID, PLASMA    Imaging Review No results found.  MDM   1. Pancreatitis   2. UTI (urinary tract infection)    66 yo female with urinary symptoms and epigastric abdominal pain.  Workup shows UTI and elevated Lipase.  Given IV Rocephin. Discussed case with Dr. Laural Benes ([facility]) who will admit.    Candyce Churn, MD 09/08/13 [phone]

## 2013-09-08 NOTE — Progress Notes (Signed)
Pt admitted to [address] from [facility] ED. Pt is alert and oriented to staff, call bell, and room. Bed in lowest position. Call bell within reach. Full assessment to Epic. Will continue to monitor. Yoshi Vicencio, RN   

## 2013-09-08 NOTE — ED Notes (Signed)
Pt sts has difficulty keeping it down and would prefer medication a different route.

## 2013-09-09 DIAGNOSIS — N390 Urinary tract infection, site not specified: Secondary | ICD-10-CM

## 2013-09-09 DIAGNOSIS — D509 Iron deficiency anemia, unspecified: Secondary | ICD-10-CM

## 2013-09-09 LAB — CBC WITH DIFFERENTIAL/PLATELET
Basophils Absolute: 0 10*3/uL (ref 0.0–0.1)
Basophils Relative: 0 % (ref 0–1)
HCT: 25.7 % — ABNORMAL LOW (ref 36.0–46.0)
Lymphocytes Relative: 15 % (ref 12–46)
MCH: 31.4 pg (ref 26.0–34.0)
MCHC: 35 g/dL (ref 30.0–36.0)
Monocytes Absolute: 0.4 10*3/uL (ref 0.1–1.0)
Neutro Abs: 3.9 10*3/uL (ref 1.7–7.7)
Platelets: 212 10*3/uL (ref 150–400)
RDW: 14.6 % (ref 11.5–15.5)
WBC: 5.6 10*3/uL (ref 4.0–10.5)

## 2013-09-09 LAB — COMPREHENSIVE METABOLIC PANEL
ALT: 7 U/L (ref 0–35)
AST: 16 U/L (ref 0–37)
Alkaline Phosphatase: 75 U/L (ref 39–117)
BUN: 13 mg/dL (ref 6–23)
CO2: 21 mEq/L (ref 19–32)
Chloride: 110 mEq/L (ref 96–112)
GFR calc Af Amer: >90 mL/min (ref >90)
GFR calc non Af Amer: 89 mL/min — ABNORMAL LOW (ref >90)
Glucose, Bld: 79 mg/dL (ref 70–99)
Potassium: 4.1 mEq/L (ref 3.5–5.1)
Sodium: 137 mEq/L (ref 135–145)
Total Bilirubin: 0.4 mg/dL (ref 0.3–1.2)
Total Protein: 4 g/dL — ABNORMAL LOW (ref 6.0–8.3)

## 2013-09-09 LAB — LIPID PANEL
LDL Cholesterol: 62 mg/dL (ref 0–99)
Triglycerides: 75 mg/dL (ref <150)
VLDL: 15 mg/dL (ref 0–40)

## 2013-09-09 LAB — LIPASE, BLOOD: Lipase: 111 U/L — ABNORMAL HIGH (ref 11–59)

## 2013-09-09 MED ORDER — SODIUM CHLORIDE 0.9 % IV BOLUS (SEPSIS)
500.0000 mL | Freq: Once | INTRAVENOUS | Status: AC
  Administered 2013-09-09: 500 mL via INTRAVENOUS

## 2013-09-09 MED ORDER — SODIUM CHLORIDE 0.9 % IV BOLUS (SEPSIS)
1000.0000 mL | Freq: Once | INTRAVENOUS | Status: AC
  Administered 2013-09-09: 1000 mL via INTRAVENOUS

## 2013-09-09 NOTE — Care Management Note (Signed)
   CARE MANAGEMENT NOTE 09/09/2013  Patient:  [name], DOBKINS   Account Number:  [id number]  Date Initiated:  09/09/2013  Documentation initiated by:  Zaila Crew  Subjective/Objective Assessment:   PT eval recommending HHPT     Action/Plan:   Noted that pt is active with [facility] for HHPT and HHOT, [facility] notifed of pt admission. Will ask for order to resume HHPT, no HHOT needed.   Anticipated DC Date:  09/12/2013   Anticipated DC Plan:  HOME W HOME HEALTH SERVICES         Choice offered to / List presented to:          [facility] arranged  HH-2 PT      [facility] agency  [facility]   Status of service:  In process, will continue to follow Medicare Important Message given?   (If response is "NO", the following Medicare IM given date fields will be blank) Date Medicare IM given:   Date Additional Medicare IM given:    Discharge Disposition:    Per UR Regulation:    If discussed at Long Length of Stay Meetings, dates discussed:    Comments:

## 2013-09-09 NOTE — Progress Notes (Signed)
[facility] PROGRESS NOTE  [name] ZOX:[id number] DOB: 04-Jul-1946 DOA: 09/08/2013 PCP: [name], MD  HPI/Subjective: 67 yo WF s/p lumbar spinal fusion surgery 3 weeks ago presented to the ER on 09/08/13 with increasing abdominal pain, fever, chills, N/D, and dry heaving.  Been experiencing urinary burning and a burning sensation in her bladder.  PCP Rx Bactrim.  She was unable to tolerate meds.  Also tried AZO tabs and was also unable to tolerate those.  She seemed clinically dehydrated on admission, febrile 101 degrees, with a + UA, and an elevated lipase level 191 on 09/08/13.  Patient denies any fever, N/V/D, urinary burning, or frequency.  Stated she dis have some chills last night (08/2213), but she had some ice chips and that may have just made her cold.  She is still feeling some mild abdominal discomfort/cramping in the mid epigastrum.  Assessment/Plan:  UTI: - Most likely caused from foley catheter and frequent in/out cath s/p lumbar fusion surgery - UA + - Urine Cx pending - Empiric IV Rocephin, awaiting Cx results and will narrow ABx  Pancreatitis/Elevate Lipase: - Unknown etiology currently.  Denies EtOH use.  Medication related? - Lipase 191 on 09/08/13; 111 on 09/09/13 - Abdominal US normal- no gallstones, pancreas normal size - Pain management - Currently NPO.  Will advance to clear liquids 09/09/13 if tolerated.  Dehydration/Weakness: - Secondary to decreased intake and fluid loss from vomiting and diarrhea - IVF 0.9% NaCl with KCL  - Will continue to monitor output and electrolytes  Fever: - Secondary to UTI - Treat prn acetaminophen - Continue to monitor   Hypokalemia/Hyponatremia: - Secondary to fluid loss from vomiting and diarrhea - IVF with KCL - Continue to monitor electrolytes  Headache: - Unknown etiology - Treat prn acetaminophen - Will continue to monitor  Code Status: Full Family Communication: Son at bedside  Disposition  Plan: Remain inaptient   Consultants:  None  Procedures:  None  Antibiotics:  Rocephin IV started 09/08/13      Objective: Filed Vitals:   09/09/13 0600  BP: 81/37  Pulse: 64  Temp: 99.2 F (37.3 C)  Resp: 16    Intake/Output Summary (Last 24 hours) at 09/09/13 1106 Last data filed at 09/09/13 1100  Gross per 24 hour  Intake    150 ml  Output    576 ml  Net   -426 ml   Filed Weights   09/08/13 2149  Weight: 63.368 kg (139 lb 11.2 oz)    Exam:   General:  Resting comfortably in bed and in no apparent distress  Cardiovascular: RRR, no murmur, gallops, rubs  Respiratory: Clear to auscultation bilaterally.  No rales, rhonchi, or wheezes  Abdomen: Soft, non-distended.  Mildly tender in mid epigastric region.  Bowel sounds heard in all 4 quadrants.  Musculoskeletal: Full ROM.  Strength 5/5. No peripheral edema.  Distal pulses intact and symmetric.  Psychiatric: Alert and oriented x 3.  Good affect, smiling.  Responds appropriately.   Data Reviewed: Basic Metabolic Panel:  Recent Labs Lab 09/08/13 1006 09/08/13 1726 09/09/13 0505  NA 131*  --  137  K 3.3*  --  4.1  CL 97  --  110  CO2 23  --  21  GLUCOSE 89  --  79  BUN 8  --  13  CREATININE 0.44* 0.50 0.70  CALCIUM 9.0  --  7.0*   Liver Function Tests:  Recent Labs Lab 09/08/13 1006 09/09/13 0505  AST 23 16  ALT 10 7  ALKPHOS 122* 75  BILITOT 0.5 0.4  PROT 5.9* 4.0*  ALBUMIN 3.0* 2.1*    Recent Labs Lab 09/08/13 1006 09/09/13 0505  LIPASE 191* 111*   No results found for this basename: AMMONIA,  in the last 168 hours CBC:  Recent Labs Lab 09/08/13 1006 09/08/13 1726 09/09/13 0505  WBC 6.1 7.4 5.6  NEUTROABS 5.5  --  3.9  HGB 11.5* 10.7* 9.0*  HCT 32.9* 30.5* 25.7*  MCV 88.7 88.4 89.5  PLT 283 267 212   Cardiac Enzymes:  Recent Labs Lab 09/08/13 1726 09/08/13 2048 09/09/13 0505  TROPONINI <0.30 <0.30 <0.30   BNP (last 3 results) No results found for this  basename: PROBNP,  in the last 8760 hours CBG: No results found for this basename: GLUCAP,  in the last 168 hours  Recent Results (from the past 240 hour(s))  CULTURE, BLOOD (ROUTINE X 2)     Status: None   Collection Time    09/08/13  3:10 PM      Result Value Range Status   Specimen Description BLOOD ARM RIGHT   Final   Special Requests BOTTLES DRAWN AEROBIC AND ANAEROBIC 10CC   Final   Culture  Setup Time     Final   Value: 09/08/2013 20:47     Performed at [facility]   Culture     Final   Value:        BLOOD CULTURE RECEIVED NO GROWTH TO DATE CULTURE WILL BE HELD FOR 5 DAYS BEFORE ISSUING A FINAL NEGATIVE REPORT     Performed at [facility]   Report Status PENDING   Incomplete  CULTURE, BLOOD (ROUTINE X 2)     Status: None   Collection Time    09/08/13  3:20 PM      Result Value Range Status   Specimen Description BLOOD ARM LEFT   Final   Special Requests BOTTLES DRAWN AEROBIC AND ANAEROBIC 10CC   Final   Culture  Setup Time     Final   Value: 09/08/2013 20:47     Performed at [facility]   Culture     Final   Value:        BLOOD CULTURE RECEIVED NO GROWTH TO DATE CULTURE WILL BE HELD FOR 5 DAYS BEFORE ISSUING A FINAL NEGATIVE REPORT     Performed at [facility]   Report Status PENDING   Incomplete     Studies: US Abdomen Complete  09/08/2013   CLINICAL DATA:  Abdominal pain. Back pain.  Elevated lipase.  EXAM: ABDOMEN ULTRASOUND  COMPARISON:  None.  FINDINGS: Gallbladder  No gallstones or wall thickening. Negative sonographic Murphy's sign.  Common bile duct  Diameter: 4 mm, normal.  Liver  No focal lesion identified. Within normal limits in parenchymal echogenicity.  IVC  No abnormality visualized.  Pancreas  Visualized portion unremarkable.  Spleen  11 cm. Normal echotexture.  Right Kidney  Length: 10.7 cm. Echogenicity within normal limits. No mass or hydronephrosis visualized.  Left Kidney  Length: 10.1 cm. Echogenicity within normal  limits. No mass or hydronephrosis visualized.  Abdominal aorta  No aneurysm visualized.  IMPRESSION: Normal abdominal ultrasound.   Electronically Signed   By: Andreas Newport M.D.   On: 09/08/2013 19:45    Scheduled Meds: . aspirin  81 mg Oral Daily  . cefTRIAXone (ROCEPHIN)  IV  1 g Intravenous Q24H  . enoxaparin (LOVENOX) injection  40 mg Subcutaneous Q24H  .  irbesartan  300 mg Oral Daily  . pantoprazole (PROTONIX) IV  40 mg Intravenous Q24H  . timolol  1 drop Both Eyes QHS   Continuous Infusions: . 0.9 % NaCl with KCl 20 mEq / L 140 mL/hr at 09/09/13 4540    Principal Problem:   Pancreatitis Active Problems:   UTI (lower urinary tract infection)   Elevated lipase   Weakness   Dehydration   S/P lumbar spinal fusion   Fever   Nausea   Headache   Hypokalemia   Hyponatremia   Anemia   Difficulty swallowing pills    Joycelyn Man, PA-S  [facility]  If 7PM-7AM, please contact night-coverage at [email or web address], password [facility] 09/09/2013, 11:06 AM  LOS: 1 day     Addendum  Patient seen and examined, chart and data base reviewed.  I agree with the above assessment and plan.  For full details please see Mrs. Joycelyn Man, PA-S note.  The above note is reviewed and addended by me.   Clint Lipps, MD [facility] Pager: [phone] 09/09/2013, 4:41 PM

## 2013-09-09 NOTE — Evaluation (Signed)
Occupational Therapy Evaluation Patient Details Name: [name] MRN: [id number] DOB: [birth year] Today's Date: 09/09/2013 Time: 4098-1191 OT Time Calculation (min): 27 min  OT Assessment / Plan / Recommendation History of present illness PT is a 67 yo female s/p L2-L5 PLIF 3 weeks ago who is now here for dehydration, UTI and possible pancreatitis.    Clinical Impression   Pt admitted 3 weeks post back surgery with pancreatitis and has deficits listed below.  Pt would benefit from cont OT to reach mod I level with LE adls.    OT Assessment  Patient needs continued OT Services    Follow Up Recommendations  No OT follow up    Barriers to Discharge      Equipment Recommendations  None recommended by OT    Recommendations for Other Services    Frequency  Min 2X/week    Precautions / Restrictions Precautions Precautions: Back;Fall Precaution Comments: Pt stated all back precautions Ily Required Braces or Orthoses: Spinal Brace Spinal Brace: Thoracolumbosacral orthotic Restrictions Weight Bearing Restrictions: No   Pertinent Vitals/Pain Pt reports very little pain.    ADL  Eating/Feeding: Performed;Independent Where Assessed - Eating/Feeding: Edge of bed Grooming: Performed;Wash/dry hands;Supervision/safety Where Assessed - Grooming: Supported standing Upper Body Bathing: Performed;Set up Where Assessed - Upper Body Bathing: Unsupported sitting Lower Body Bathing: Simulated;Moderate assistance Where Assessed - Lower Body Bathing: Supported sit to stand Upper Body Dressing: Performed;Minimal assistance Where Assessed - Upper Body Dressing: Unsupported sitting Lower Body Dressing: Performed;Moderate assistance Where Assessed - Lower Body Dressing: Supported sit to stand Toilet Transfer: Research scientist (life sciences) Method:  (walked to br) Acupuncturist: Raised toilet seat with arms (or 3-in-1 over toilet) Toileting - Clothing Manipulation  and Hygiene: Performed;Supervision/safety Where Assessed - Engineer, mining and Hygiene: Sit to stand from 3-in-1 or toilet Equipment Used: Rolling walker Transfers/Ambulation Related to ADLs: Pt walked in room with S. ADL Comments: Pt with difficulty with LE adls.  Family has been doing this for her at home.  Pt has ability to do some of this Ily now.    OT Diagnosis: Generalized weakness;Acute pain  OT Problem List: Decreased strength;Decreased knowledge of use of DME or AE;Pain OT Treatment Interventions: Self-care/ADL training;Therapeutic activities   OT Goals(Current goals can be found in the care plan section) Acute Rehab OT Goals Patient Stated Goal: to be back home feeling well. OT Goal Formulation: With patient/family Time For Goal Achievement: 09/16/13 Potential to Achieve Goals: Good ADL Goals Pt Will Perform Grooming: with modified independence;standing Pt Will Perform Lower Body Bathing: with supervision;sit to/from stand Pt Will Perform Lower Body Dressing: with modified independence;sit to/from stand  Visit Information  Last OT Received On: 09/09/13 Assistance Needed: +1 History of Present Illness: PT is a 67 yo female s/p L2-L5 PLIF 3 weeks ago who is now here for dehydration, UTI and possible pancreatitis.        Prior Functioning     Home Living Family/patient expects to be discharged to:: Private residence Living Arrangements: Spouse/significant other Available Help at Discharge: Family;Available 24 hours/day Type of Home: House Home Access: Level entry Home Layout: One level Home Equipment: Walker - 4 wheels;Cane - single point;Bedside commode Additional Comments: tub Prior Function Level of Independence: Independent with assistive device(s) Comments: Pt has toilet riser with no handles and is not on the commode at all times. Pt and family encouraged to place seat on commode.  Communication Communication: No difficulties Dominant Hand:  Right  Vision/Perception Vision - History Baseline Vision: Wears glasses all the time Visual History: Glaucoma Patient Visual Report: No change from baseline Vision - Assessment Vision Assessment: Vision not tested   Cognition  Cognition Arousal/Alertness: Awake/alert Behavior During Therapy: WFL for tasks assessed/performed Overall Cognitive Status: Within Functional Limits for tasks assessed    Extremity/Trunk Assessment Upper Extremity Assessment Upper Extremity Assessment: Overall WFL for tasks assessed Lower Extremity Assessment Lower Extremity Assessment: Defer to PT evaluation Cervical / Trunk Assessment Cervical / Trunk Assessment: Normal     Mobility Bed Mobility Bed Mobility: Right Sidelying to Sit;Rolling Right;Sitting - Scoot to Edge of Bed Rolling Right: 5: Supervision;With rail Right Sidelying to Sit: 5: Supervision;With rails;HOB flat Sitting - Scoot to Edge of Bed: 5: Supervision Details for Bed Mobility Assistance: Pt required cues to attempt to be I.  Pt used to pulling on family member to get up. Pt encouraged to get up w/o assist and was able. Transfers Transfers: Sit to Stand;Stand to Sit Sit to Stand: 5: Supervision;From bed Stand to Sit: 5: Supervision;To chair/3-in-1 Details for Transfer Assistance: Pt safe and I with tranfers.  Pt nervous and needs reassurance.     Exercise     Balance Balance Balance Assessed: No   End of Session OT - End of Session Equipment Utilized During Treatment: Rolling walker Activity Tolerance: Patient tolerated treatment well Patient left: Other (comment) (w PT walking in hallway) Nurse Communication: Mobility status  GO     Hope Budds 09/09/2013, 9:17 AM [phone]

## 2013-09-09 NOTE — Progress Notes (Signed)
Brief Nutrition Note:   RD pulled to pt for malnutrition screening tool.  Pt states her appetite has been good, and is ready to try some clears. Pt states weight is usually 116 lbs, but currently with some excess fluid.  Wt Readings from Last 5 Encounters:  09/09/13 139 lb 10.9 oz (63.36 kg)  08/14/13 116 lb 14.4 oz (53.025 kg)  08/14/13 116 lb 14.4 oz (53.025 kg)  08/08/13 113 lb 11.2 oz (51.574 kg)  03/14/13 114 lb 11.2 oz (52.028 kg)   Body mass index is 30.22 kg/(m^2). WNL  Chart reviewed, no nutrition interventions warranted at this time. Please consult as needed.   Isabell Jarvis RD, LDN Pager [phone] After Hours pager [phone]

## 2013-09-09 NOTE — Evaluation (Signed)
Physical Therapy Evaluation Patient Details Name: [name] MRN: [id number] DOB: [birth year] Today's Date: 09/09/2013 Time: 0902-0925 PT Time Calculation (min): 23 min  PT Assessment / Plan / Recommendation History of Present Illness  PT is a 67 yo female s/p L2-L5 PLIF 3 weeks ago who is now here for dehydration, UTI and possible pancreatitis.   Clinical Impression  Pt admitted with above. Pt currently with functional limitations due to the deficits listed below (see PT Problem List).  Pt will benefit from skilled PT to increase their independence and safety with mobility to allow discharge to the venue listed below.       PT Assessment  Patient needs continued PT services    Follow Up Recommendations  Home health PT    Does the patient have the potential to tolerate intense rehabilitation      Barriers to Discharge        Equipment Recommendations  None recommended by PT    Recommendations for Other Services     Frequency Min 3X/week    Precautions / Restrictions Precautions Precautions: Back;Fall Precaution Comments: Pt stated all back precautions Ily Required Braces or Orthoses: Spinal Brace Spinal Brace: Thoracolumbosacral orthotic;Applied in sitting position Restrictions Weight Bearing Restrictions: No   Pertinent Vitals/Pain No c/o's      Mobility  Bed Mobility Bed Mobility: Right Sidelying to Sit;Rolling Right;Sitting - Scoot to Edge of Bed Rolling Right: 5: Supervision;With rail Right Sidelying to Sit: 5: Supervision;With rails;HOB flat Sitting - Scoot to Edge of Bed: 5: Supervision Details for Bed Mobility Assistance: Pt required cues to attempt to be I.  Pt used to pulling on family member to get up. Pt encouraged to get up w/o assist and was able. Transfers Sit to Stand: 5: Supervision;From bed Stand to Sit: 5: Supervision;To chair/3-in-1;With armrests Details for Transfer Assistance: Pt safe and I with tranfers.  Pt nervous and needs  reassurance. Ambulation/Gait Ambulation/Gait Assistance: 4: Min assist Ambulation Distance (Feet): 350 Feet Assistive device: Rolling walker Ambulation/Gait Assistance Details: Pt slightly unsteady but no loss of balance. Gait Pattern: Step-through pattern;Decreased stride length;Left steppage Gait velocity: decr    Exercises     PT Diagnosis: Difficulty walking;Generalized weakness  PT Problem List: Decreased strength;Decreased balance;Decreased mobility PT Treatment Interventions: DME instruction;Gait training;Functional mobility training;Therapeutic activities;Balance training;Patient/family education;Therapeutic exercise     PT Goals(Current goals can be found in the care plan section) Acute Rehab PT Goals Patient Stated Goal: to be back home feeling well. PT Goal Formulation: With patient Time For Goal Achievement: 09/16/13 Potential to Achieve Goals: Good  Visit Information  Last PT Received On: 09/09/13 Assistance Needed: +1 History of Present Illness: PT is a 67 yo female s/p L2-L5 PLIF 3 weeks ago who is now here for dehydration, UTI and possible pancreatitis.        Prior Functioning  Home Living Family/patient expects to be discharged to:: Private residence Living Arrangements: Spouse/significant other Available Help at Discharge: Family;Available 24 hours/day Type of Home: House Home Access: Level entry Home Layout: One level Home Equipment: Walker - 4 wheels;Cane - single point;Bedside commode Additional Comments: tub Prior Function Level of Independence: Independent with assistive device(s) Comments: Pt has toilet riser with no handles and is not on the commode at all times. Pt and family encouraged to place seat on commode.  Communication Communication: No difficulties Dominant Hand: Right    Cognition  Cognition Arousal/Alertness: Awake/alert Behavior During Therapy: WFL for tasks assessed/performed Overall Cognitive Status: Within Functional Limits  for  tasks assessed    Extremity/Trunk Assessment Upper Extremity Assessment Upper Extremity Assessment: Overall WFL for tasks assessed Lower Extremity Assessment Lower Extremity Assessment: LLE deficits/detail;Generalized weakness LLE Deficits / Details: dorsiflex <2/5 Cervical / Trunk Assessment Cervical / Trunk Assessment: Normal   Balance Balance Balance Assessed: Yes Static Standing Balance Static Standing - Balance Support: Bilateral upper extremity supported Static Standing - Level of Assistance: 5: Stand by assistance  End of Session PT - End of Session Equipment Utilized During Treatment: Back brace Activity Tolerance: Patient tolerated treatment well Patient left: in chair;with call bell/phone within reach;with family/visitor present Nurse Communication: Mobility status  GP     Sham Alviar 09/09/2013, 10:11 AM  [employer] PT [phone]

## 2013-09-10 LAB — CBC
HCT: 25.6 % — ABNORMAL LOW (ref 36.0–46.0)
MCHC: 34.4 g/dL (ref 30.0–36.0)
MCV: 91.4 fL (ref 78.0–100.0)
Platelets: 197 10*3/uL (ref 150–400)
RDW: 15 % (ref 11.5–15.5)
WBC: 4.6 10*3/uL (ref 4.0–10.5)

## 2013-09-10 LAB — BASIC METABOLIC PANEL
BUN: 10 mg/dL (ref 6–23)
Creatinine, Ser: 0.48 mg/dL — ABNORMAL LOW (ref 0.50–1.10)
GFR calc Af Amer: >90 mL/min (ref >90)
GFR calc non Af Amer: >90 mL/min (ref >90)
Glucose, Bld: 73 mg/dL (ref 70–99)

## 2013-09-10 LAB — HEPATIC FUNCTION PANEL
AST: 18 U/L (ref 0–37)
Alkaline Phosphatase: 75 U/L (ref 39–117)
Bilirubin, Direct: <0.1 mg/dL (ref 0.0–0.3)
Total Bilirubin: 0.2 mg/dL — ABNORMAL LOW (ref 0.3–1.2)

## 2013-09-10 MED ORDER — PANTOPRAZOLE SODIUM 40 MG PO TBEC: 40.0000 mg | DELAYED_RELEASE_TABLET | Freq: Every day | ORAL | Status: DC

## 2013-09-10 MED ORDER — CIPROFLOXACIN 500 MG/5ML (10%) PO SUSR: 500.0000 mg | Freq: Two times a day (BID) | ORAL | Status: DC

## 2013-09-10 NOTE — Discharge Summary (Signed)
  I have directly reviewed the clinical findings, lab, imaging studies and management of this patient in detail. I have interviewed and examined the patient and agree with the documentation,  as recorded by the Physician extender.  Leroy Sea M.D on 09/10/2013 at 1:23 PM  [facility] Office  [phone]

## 2013-09-10 NOTE — Discharge Summary (Signed)
Physician Discharge Summary  [name]:[id number] DOB: Jun 16, 1946 DOA: 09/08/2013  PCP: [name]  Admit date: 09/08/2013 Discharge date: 09/10/2013  Time spent: 60 minutes  Recommendations for Outpatient Follow-up:  1. Recheck BP in one week.  D/C Benicar due to pancreatitis.  2. Check final results on urine culture - ensure the UTI is sensitive to cipro 3. Home Health PT recommended min 3x/week (s/p lumbar spine fusion)  Discharge Diagnoses:  Principal Problem:   Pancreatitis Active Problems:   UTI (lower urinary tract infection)   Elevated lipase   Weakness   Dehydration   S/P lumbar spinal fusion   Fever   Nausea   Headache   Hypokalemia   Hyponatremia   Anemia   Difficulty swallowing pills   Discharge Condition: Stable  Diet recommendation: Low fat  Filed Weights   09/08/13 2149 09/09/13 1100  Weight: 63.368 kg (139 lb 11.2 oz) 63.36 kg (139 lb 10.9 oz)    History of present illness at the time of admission:  67 yo WF s/p lumbar spinal fusion surgery 3 weeks ago presented to the ER on 09/08/13 with increasing abdominal pain, fever, chills, N/D, and dry heaving.  Been experiencing urinary burning and a burning sensation in her bladder. PCP Rx Bactrim. She was unable to tolerate meds. Also tried AZO tabs and was also unable to tolerate those. She seemed clinically dehydrated on admission, febrile 101 degrees, with a + UA, and an elevated lipase level 191 on 09/08/13.   Hospital Course:  UTI:  - Most likely caused from foley catheter and frequent in/out cath s/p lumbar fusion surgery  - UA + for E-coli - Final Urine Cx pending  - Empiric IV Rocephin x 3 days, D/C home with Cipro 500mg  susp x 4 days  Pancreatitis/Elevate Lipase:  - Unknown etiology currently. Denies EtOH use.  - Lipase 191 on 09/08/13;  43 on 09/10/13  - Abdominal US normal- no gallstones, pancreas normal size  - Pain management  - Slowly advancing diet as tolerated. -  Benicar discontinued.  Dehydration/Weakness:  - Secondary to decreased intake and fluid loss from vomiting and diarrhea , as well as recent spinal surgery - Home health PT ordered. - improved resolved with IVF   Fever:  - Secondary to UTI  - resolved.  Hypokalemia/Hyponatremia:  - Secondary to fluid loss from vomiting and diarrhea  - repleted.   Discharge Exam: Filed Vitals:   09/10/13 0618  BP: 139/89  Pulse: 52  Temp: 98.5 F (36.9 C)  Resp: 18    General: Resting comfortably in no apparent distress Cardiovascular: RRR, no murmur, gallops, or rubs Respiratory: Clear to auscultation bilaterally.  No rales, rhochi, or wheezes. Abdomen:  Mildly tender over epigastric area, but markedly less.  Abdomen soft.  Bowel sounds heard in all 4 quadrants Musculoskeletal: Full ROM.  Limited spinal flexion due to lumbar spinal fusion.  Back brace on.  No peripheral edema.  Distal pulses intact and symmetric. Psych: Alert and Oriented x 3. Good affect, smiling, responds appropriately.  Discharge Instructions     Discharge Orders   Future Orders Complete By Expires   Activity as tolerated - No restrictions  As directed    Comments:     Home health PT min 3x/week   Call Marshall for:  persistant nausea and vomiting  As directed    Call Marshall for:  severe uncontrolled pain  As directed    Call Marshall for:  temperature >100.4  As directed  Diet - low sodium heart healthy  As directed    Increase activity slowly  As directed        Medication List    STOP taking these medications       olmesartan 40 MG tablet  Commonly known as:  BENICAR     sulfamethoxazole-trimethoprim 200-40 MG/5ML suspension  Commonly known as:  BACTRIM,SEPTRA      TAKE these medications       aspirin 81 MG chewable tablet  Chew 81 mg by mouth daily.     CALCIUM 600+D PLUS MINERALS 600-400 MG-UNIT Chew  Chew 2 tablets by mouth daily.     ciprofloxacin 500 MG/5ML (10%) suspension  Commonly known as:  CIPRO   Take 5 mLs (500 mg total) by mouth 2 (two) times daily.     HYDROcodone-acetaminophen 7.5-325 mg/15 ml solution  Commonly known as:  HYCET  Take 10-20 mLs by mouth every 4 (four) hours as needed for pain.     omeprazole 20 MG capsule  Commonly known as:  PRILOSEC  Take 20 mg by mouth 2 (two) times daily.     phenazopyridine 95 MG tablet  Commonly known as:  PYRIDIUM  Take 190 mg by mouth 3 (three) times daily as needed for pain.     timolol 0.5 % ophthalmic solution  Commonly known as:  BETIMOL  Place 1 drop into both eyes at bedtime.     traMADol 50 MG tablet  Commonly known as:  ULTRAM  Take 50 mg by mouth daily as needed for pain.       No Known Allergies Follow-up Information   Follow up with [name] In 1 week.   Specialty:  Internal Medicine   Contact information:   [address] [place] [address] [place] [address] [phone]       Follow up with [name], Marshall On 10/14/2013. (Keep appointment previously scheduled)    Specialty:  Neurosurgery   Contact information:   [address] [place] [address] [phone]        The results of significant diagnostics from this hospitalization (including imaging, microbiology, ancillary and laboratory) are listed below for reference.    Significant Diagnostic Studies:  US Abdomen Complete  09/08/2013   CLINICAL DATA:  Abdominal pain. Back pain.  Elevated lipase.  EXAM: ABDOMEN ULTRASOUND  COMPARISON:  None.  FINDINGS: Gallbladder  No gallstones or wall thickening. Negative sonographic Murphy's sign.  Common bile duct  Diameter: 4 mm, normal.  Liver  No focal lesion identified. Within normal limits in parenchymal echogenicity.  IVC  No abnormality visualized.  Pancreas  Visualized portion unremarkable.  Spleen  11 cm. Normal echotexture.  Right Kidney  Length: 10.7 cm. Echogenicity within normal limits. No mass or hydronephrosis visualized.  Left  Kidney  Length: 10.1 cm. Echogenicity within normal limits. No mass or hydronephrosis visualized.  Abdominal aorta  No aneurysm visualized.  IMPRESSION: Normal abdominal ultrasound.   Electronically Signed   By: Andreas Newport M.D.   On: 09/08/2013 19:45      Microbiology: Recent Results (from the past 240 hour(s))  URINE CULTURE     Status: None   Collection Time    09/08/13 10:15 AM      Result Value Range Status   Specimen Description URINE, CATHETERIZED   Final   Special Requests NONE   Final   Culture  Setup Time     Final  Value: 09/09/2013 09:21     Performed at [facility] Count     Final   Value: >=100,000 COLONIES/ML     Performed at [facility]   Culture     Final   Value: ESCHERICHIA COLI     Performed at [facility]   Report Status PENDING   Incomplete  CULTURE, BLOOD (ROUTINE X 2)     Status: None   Collection Time    09/08/13  3:10 PM      Result Value Range Status   Specimen Description BLOOD ARM RIGHT   Final   Special Requests BOTTLES DRAWN AEROBIC AND ANAEROBIC 10CC   Final   Culture  Setup Time     Final   Value: 09/08/2013 20:47     Performed at [facility]   Culture     Final   Value:        BLOOD CULTURE RECEIVED NO GROWTH TO DATE CULTURE WILL BE HELD FOR 5 DAYS BEFORE ISSUING A FINAL NEGATIVE REPORT     Performed at [facility]   Report Status PENDING   Incomplete  CULTURE, BLOOD (ROUTINE X 2)     Status: None   Collection Time    09/08/13  3:20 PM      Result Value Range Status   Specimen Description BLOOD ARM LEFT   Final   Special Requests BOTTLES DRAWN AEROBIC AND ANAEROBIC 10CC   Final   Culture  Setup Time     Final   Value: 09/08/2013 20:47     Performed at [facility]   Culture     Final   Value:        BLOOD CULTURE RECEIVED NO GROWTH TO DATE CULTURE WILL BE HELD FOR 5 DAYS BEFORE ISSUING A FINAL NEGATIVE REPORT     Performed at [facility]   Report Status PENDING    Incomplete     Labs: Basic Metabolic Panel:  Recent Labs Lab 09/08/13 1006 09/08/13 1726 09/09/13 0505 09/10/13 0500  NA 131*  --  137 137  K 3.3*  --  4.1 4.9  CL 97  --  110 111  CO2 23  --  21 18*  GLUCOSE 89  --  79 73  BUN 8  --  13 10  CREATININE 0.44* 0.50 0.70 0.48*  CALCIUM 9.0  --  7.0* 7.6*   Liver Function Tests:  Recent Labs Lab 09/08/13 1006 09/09/13 0505 09/10/13 0500  AST 23 16 18   ALT 10 7 7   ALKPHOS 122* 75 75  BILITOT 0.5 0.4 0.2*  PROT 5.9* 4.0* 4.5*  ALBUMIN 3.0* 2.1* 2.2*    Recent Labs Lab 09/08/13 1006 09/09/13 0505 09/10/13 0500  LIPASE 191* 111* 43   CBC:  Recent Labs Lab 09/08/13 1006 09/08/13 1726 09/09/13 0505 09/10/13 0500  WBC 6.1 7.4 5.6 4.6  NEUTROABS 5.5  --  3.9  --   HGB 11.5* 10.7* 9.0* 8.8*  HCT 32.9* 30.5* 25.7* 25.6*  MCV 88.7 88.4 89.5 91.4  PLT 283 267 212 197   Cardiac Enzymes:  Recent Labs Lab 09/08/13 1726 09/08/13 2048 09/09/13 0505  TROPONINI <0.30 <0.30 <0.30    Signed:  Joycelyn Man, PA-C Algis Downs, PA-C  [facility] 09/10/2013, 12:40 PM

## 2013-09-10 NOTE — Progress Notes (Signed)
Fellow RN Meridee Score discussed discharge instructions with pt. Pt showed no barriers to discharge. Family member at bedside when instructions were discussed. Rx given to pt. Pt to resume HH PT with [facility]. IV removed. Assessment unchanged from morning.

## 2013-09-10 NOTE — Care Management Note (Signed)
   CARE MANAGEMENT NOTE 09/10/2013  Patient:  NISHITA, ISAACKS   Account Number:  [id number]  Date Initiated:  09/09/2013  Documentation initiated by:  Laveta Gilkey  Subjective/Objective Assessment:   PT eval recommending HHPT     Action/Plan:   Noted that pt is active with [facility] for HHPT and HHOT, [facility] notifed of pt admission. Will ask for order to resume HHPT, no HHOT needed.   Anticipated DC Date:  09/10/2013   Anticipated DC Plan:  HOME W HOME HEALTH SERVICES         Choice offered to / List presented to:          [facility] arranged  HH-2 PT      [facility] agency  [facility]   Status of service:  Completed, signed off Medicare Important Message given?   (If response is "NO", the following Medicare IM given date fields will be blank) Date Medicare IM given:   Date Additional Medicare IM given:    Discharge Disposition:  HOME W HOME HEALTH SERVICES  Per UR Regulation:    If discussed at Long Length of Stay Meetings, dates discussed:    Comments:  09/10/2013 Pt active with [facility] for [facility] services, have spoke with [place] and service will be resumed. CRoyal RN MPH, case manager, [phone]

## 2013-09-10 NOTE — Progress Notes (Signed)
PT Cancellation Note  Patient Details Name: [name] MRN: [id number] DOB: 1946-01-30   Cancelled Treatment:    Reason Eval/Treat Not Completed: Pain limiting ability to participate (headache). Pt will amb with family or staff later if she feels better.   [name] 09/10/2013, 10:47 AM

## 2013-09-11 ENCOUNTER — Telehealth: Payer: Self-pay | Admitting: Gastroenterology

## 2013-09-11 ENCOUNTER — Other Ambulatory Visit (HOSPITAL_COMMUNITY): Payer: Self-pay | Admitting: Physician Assistant

## 2013-09-11 LAB — URINE CULTURE

## 2013-09-11 MED ORDER — NITROFURANTOIN MONOHYD MACRO 100 MG PO CAPS: 100.0000 mg | ORAL_CAPSULE | Freq: Two times a day (BID) | ORAL | Status: DC

## 2013-09-11 NOTE — Telephone Encounter (Signed)
Pt scheduled to see Dr. Arlyce Dice 10/03/13@9 :30am. Pt aware of appt.

## 2013-09-12 ENCOUNTER — Telehealth (HOSPITAL_COMMUNITY): Payer: Self-pay | Admitting: Physician Assistant

## 2013-09-12 NOTE — Telephone Encounter (Signed)
Telephone call -   Urine cultures revealed Ms. Woolever's E-coli infection was resistant to cipro.  It was sensitive to macrobid.  Ms. Whitmore reportedly can not take pills so macrobid solution was called into the [facility] aid pharmacy for her.  On 09/12/13, the patient's daughter called because her mother was dry heaving the macrobid.  Nausea medication was requested.  Phenergan suppositories were offered but refused as the patient has a painful hemorrhoid.  The patient requested phenergan pills that can be crushed.  Phenergan 25 mg PO q 8 hours PRN nausea, quantity 15 with no refills was called in.  Algis Downs, PA-C [facility] Pager: [phone]

## 2013-09-13 ENCOUNTER — Emergency Department (HOSPITAL_COMMUNITY)
Admission: EM | Admit: 2013-09-13 | Discharge: 2013-09-13 | Disposition: A | Discharge status: Home / Self Care | Payer: Medicare Other | Attending: Emergency Medicine | Admitting: Emergency Medicine

## 2013-09-13 ENCOUNTER — Encounter (HOSPITAL_COMMUNITY): Payer: Self-pay | Admitting: *Deleted

## 2013-09-13 DIAGNOSIS — K219 Gastro-esophageal reflux disease without esophagitis: Secondary | ICD-10-CM | POA: Insufficient documentation

## 2013-09-13 DIAGNOSIS — R1110 Vomiting, unspecified: Secondary | ICD-10-CM

## 2013-09-13 DIAGNOSIS — Z79899 Other long term (current) drug therapy: Secondary | ICD-10-CM | POA: Insufficient documentation

## 2013-09-13 DIAGNOSIS — Z8639 Personal history of other endocrine, nutritional and metabolic disease: Secondary | ICD-10-CM | POA: Insufficient documentation

## 2013-09-13 DIAGNOSIS — G8929 Other chronic pain: Secondary | ICD-10-CM | POA: Insufficient documentation

## 2013-09-13 DIAGNOSIS — Z862 Personal history of diseases of the blood and blood-forming organs and certain disorders involving the immune mechanism: Secondary | ICD-10-CM | POA: Insufficient documentation

## 2013-09-13 DIAGNOSIS — I10 Essential (primary) hypertension: Secondary | ICD-10-CM | POA: Insufficient documentation

## 2013-09-13 DIAGNOSIS — M1990 Unspecified osteoarthritis, unspecified site: Secondary | ICD-10-CM | POA: Insufficient documentation

## 2013-09-13 DIAGNOSIS — Z8669 Personal history of other diseases of the nervous system and sense organs: Secondary | ICD-10-CM | POA: Insufficient documentation

## 2013-09-13 DIAGNOSIS — R112 Nausea with vomiting, unspecified: Secondary | ICD-10-CM | POA: Insufficient documentation

## 2013-09-13 DIAGNOSIS — Z7982 Long term (current) use of aspirin: Secondary | ICD-10-CM | POA: Insufficient documentation

## 2013-09-13 LAB — COMPREHENSIVE METABOLIC PANEL
ALT: 12 U/L (ref 0–35)
AST: 24 U/L (ref 0–37)
Albumin: 3.3 g/dL — ABNORMAL LOW (ref 3.5–5.2)
Alkaline Phosphatase: 103 U/L (ref 39–117)
BUN: 4 mg/dL — ABNORMAL LOW (ref 6–23)
CO2: 27 mEq/L (ref 19–32)
Calcium: 9.1 mg/dL (ref 8.4–10.5)
Chloride: 93 mEq/L — ABNORMAL LOW (ref 96–112)
Creatinine, Ser: 0.45 mg/dL — ABNORMAL LOW (ref 0.50–1.10)
GFR calc Af Amer: >90 mL/min (ref >90)
GFR calc non Af Amer: >90 mL/min (ref >90)
Glucose, Bld: 96 mg/dL (ref 70–99)
Potassium: 3 mEq/L — ABNORMAL LOW (ref 3.5–5.1)
Sodium: 131 mEq/L — ABNORMAL LOW (ref 135–145)
Total Bilirubin: 0.5 mg/dL (ref 0.3–1.2)
Total Protein: 6.1 g/dL (ref 6.0–8.3)

## 2013-09-13 LAB — CBC WITH DIFFERENTIAL/PLATELET
Band Neutrophils: 0 % (ref 0–10)
Basophils Absolute: 0 10*3/uL (ref 0.0–0.1)
Basophils Relative: 0 % (ref 0–1)
Eosinophils Absolute: 0.1 10*3/uL (ref 0.0–0.7)
Eosinophils Relative: 4 % (ref 0–5)
HCT: 33.2 % — ABNORMAL LOW (ref 36.0–46.0)
Hemoglobin: 11.5 g/dL — ABNORMAL LOW (ref 12.0–15.0)
MCH: 30.7 pg (ref 26.0–34.0)
MCV: 88.8 fL (ref 78.0–100.0)
Metamyelocytes Relative: 0 %
Monocytes Absolute: 0.6 10*3/uL (ref 0.1–1.0)
Myelocytes: 0 %
Neutro Abs: 0.7 10*3/uL — ABNORMAL LOW (ref 1.7–7.7)
Neutrophils Relative %: 25 % — ABNORMAL LOW (ref 43–77)
Promyelocytes Absolute: 0 %
RBC: 3.74 MIL/uL — ABNORMAL LOW (ref 3.87–5.11)
WBC: 2.8 10*3/uL — ABNORMAL LOW (ref 4.0–10.5)

## 2013-09-13 LAB — URINALYSIS, ROUTINE W REFLEX MICROSCOPIC
Bilirubin Urine: NEGATIVE
Glucose, UA: NEGATIVE mg/dL
Ketones, ur: 15 mg/dL — AB
Leukocytes, UA: NEGATIVE
Protein, ur: NEGATIVE mg/dL
Urobilinogen, UA: 1 mg/dL (ref 0.0–1.0)

## 2013-09-13 LAB — LIPASE, BLOOD: Lipase: 77 U/L — ABNORMAL HIGH (ref 11–59)

## 2013-09-13 MED ORDER — ONDANSETRON HCL 4 MG/2ML IJ SOLN
4.0000 mg | Freq: Once | INTRAMUSCULAR | Status: AC
  Administered 2013-09-13: 4 mg via INTRAVENOUS
  Filled 2013-09-13: qty 2

## 2013-09-13 MED ORDER — ACETAMINOPHEN 160 MG/5ML PO SOLN
500.0000 mg | Freq: Once | ORAL | Status: AC
Start: 2013-09-13 — End: 2013-09-13
  Administered 2013-09-13: 500 mg via ORAL
  Filled 2013-09-13: qty 20.3

## 2013-09-13 MED ORDER — POTASSIUM CHLORIDE CRYS ER 20 MEQ PO TBCR
40.0000 meq | EXTENDED_RELEASE_TABLET | Freq: Once | ORAL | Status: DC
  Filled 2013-09-13: qty 2

## 2013-09-13 MED ORDER — SODIUM CHLORIDE 0.9 % IV BOLUS (SEPSIS)
1000.0000 mL | Freq: Once | INTRAVENOUS | Status: AC
  Administered 2013-09-13: 1000 mL via INTRAVENOUS

## 2013-09-13 MED ORDER — POTASSIUM CHLORIDE 20 MEQ/15ML (10%) PO LIQD
40.0000 meq | Freq: Once | ORAL | Status: AC
  Administered 2013-09-13: 40 meq via ORAL
  Filled 2013-09-13: qty 30

## 2013-09-13 MED ORDER — ONDANSETRON 4 MG PO TBDP: 4.0000 mg | ORAL_TABLET | Freq: Three times a day (TID) | ORAL | Status: DC | PRN

## 2013-09-13 MED ORDER — ONDANSETRON HCL 4 MG/2ML IJ SOLN
4.0000 mg | Freq: Once | INTRAMUSCULAR | Status: AC
Start: 2013-09-13 — End: 2013-09-13
  Administered 2013-09-13: 4 mg via INTRAVENOUS
  Filled 2013-09-13: qty 2

## 2013-09-13 MED ORDER — MORPHINE SULFATE 2 MG/ML IJ SOLN
2.0000 mg | Freq: Once | INTRAMUSCULAR | Status: AC
  Administered 2013-09-13: 2 mg via INTRAVENOUS
  Filled 2013-09-13: qty 1

## 2013-09-13 NOTE — ED Provider Notes (Signed)
Medical screening examination/treatment/procedure(s) were conducted as a shared visit with non-physician practitioner(s) and myself.  I personally evaluated the patient during the encounter Patient presenting with nausea and vomiting that has been persistent since leaving the hospital because she's been trying to take the antibiotic for the UTI and it causes her to BE nauseated. Patient is hemodynamically stable and labs other than mild hypokalemia her within normal limits also her urine today with no sign of infection. Patient feeling much better after hydration and tolerating by mouth's  Gwyneth Sprout, MD 09/13/13 2313

## 2013-09-13 NOTE — ED Notes (Addendum)
PT being tx for UTI and was just discharged Wed after being tx for pancreatitis.  Was unable to tolerate 1st antibiotic so started septra last night.  Pt has been unable to tolerate any po since last night.  Pt is c/o headache, lower abdominal pain and shoulder spasms.

## 2013-09-13 NOTE — ED Provider Notes (Signed)
CSN: [id number]     Arrival date & time 09/13/13  1654 History   First MD Initiated Contact with Patient 09/13/13 1731     Chief Complaint  Patient presents with  . Emesis   (Consider location/radiation/quality/duration/timing/severity/associated sxs/prior Treatment) HPI  Patient presents to the ER with concerns of vomiting up antibiotic medication to treat urinary tract infection. Pt was placed in the hospital for pancreatitis a few days ago and while there they discovered her UTI. Urine cultured showed the patients current abx Cipro was resistant to bacteria and she was switched to Macrobid. The patient has been unable to tolerate the abx and vomits after every ingestion. She does not have problems keeping fluids down when if she has not had the medications. She had pudding and jello this morning. She has no fevers or weakness. She has a large back brace on due to recent back surgery and osteoporosis. The patient has otherwise been feeling much better. No abdominal pains or diarrhea. No dysuria, urinary frequency or hematuria.   Past Medical History  Diagnosis Date  . HTN (hypertension)   . Glaucoma   . Sjogren's syndrome   . Raynaud phenomenon   . Osteopenia   . Polymyositis   . GERD (gastroesophageal reflux disease)   . Swallowing difficulty     unable to swallow whole pills  . AAA (abdominal aortic aneurysm)     small      report in epic 11/11 states no aaa    . Iron deficiency anemia     hx  . Unspecified deficiency anemia 03/14/2013  . Family history of anesthesia complication     "sister has severe sleep apnea; she has alot of trouble w/anesthesia" (09/08/2013)  . High cholesterol     "not severe; can't take the medicine" (09/08/2013)  . Polymyositis   . Multiple thyroid nodules   . Migraines     "hurt like a migraine; don't get them like I did when I was younger" (09/08/2013)  . OA (osteoarthritis)   . Chronic lower back pain     "for 6 years; relieved since back OR 07/2013"  (09/08/2013)   Past Surgical History  Procedure Laterality Date  . Cataract extraction w/ intraocular lens  implant, bilateral Bilateral 12/2010-01/2011  . Esophageal dilation      "a few times before 02/2010; not since" (09/08/2013)  . Repair zenker's diverticula  02/2010    "diverticulectomy w/zenker's pouch correction" (09/08/2013)  . Dental surgery  11/2011-12/2011    one on each side in my bottom jaw; dental implants" (09/08/2013)  . Lumbar fusion  Aug 14, 2013    Dr. Blanche East   Family History  Problem Relation Age of Onset  . Cancer Mother     ovarian  . Ovarian cancer Mother   . Cancer Father     colon and breast  . Hypertension Father   . Heart disease Father   . Breast cancer Father     Age 65  . Colon cancer Father   . Psoriasis Sister   . Sarcoidosis Sister   . Arthritis Sister     psoriatic arthritis  . Diabetes Sister   . Arthritis Maternal Uncle     rheumatiod arthritis  . Diabetes Son   . Heart disease Brother    History  Substance Use Topics  . Smoking status: Never Smoker   . Smokeless tobacco: Never Used  . Alcohol Use: No   OB History   Grav Para Term Preterm Abortions TAB  SAB Ect Mult Living   4 4 4       4      Review of Systems  Gastrointestinal: Positive for nausea and vomiting.  All other systems reviewed and are negative.    Allergies  Review of patient's allergies indicates no known allergies.  Home Medications   Current Outpatient Rx  Name  Route  Sig  Dispense  Refill  . aspirin 81 MG chewable tablet   Oral   Chew 81 mg by mouth daily.         . Calcium Carbonate-Vit D-Min (CALCIUM 600+D PLUS MINERALS) 600-400 MG-UNIT CHEW   Oral   Chew 2 tablets by mouth daily.         . ciprofloxacin (CIPRO) 500 MG/5ML (10%) suspension   Oral   Take 5 mLs (500 mg total) by mouth 2 (two) times daily.   50 mL   0   . HYDROcodone-acetaminophen (HYCET) 7.5-325 mg/15 ml solution   Oral   Take 10-20 mLs by mouth every 4 (four) hours as needed  for pain.   473 mL   0   . nitrofurantoin, macrocrystal-monohydrate, (MACROBID) 100 MG capsule   Oral   Take 1 capsule (100 mg total) by mouth 2 (two) times daily.   6 capsule   0   . omeprazole (PRILOSEC) 20 MG capsule   Oral   Take 20 mg by mouth 2 (two) times daily.          . phenazopyridine (PYRIDIUM) 95 MG tablet   Oral   Take 190 mg by mouth 3 (three) times daily as needed for pain.         Marland Kitchen timolol (BETIMOL) 0.5 % ophthalmic solution   Both Eyes   Place 1 drop into both eyes at bedtime.         . traMADol (ULTRAM) 50 MG tablet   Oral   Take 50 mg by mouth daily as needed for pain.          BP 167/89  Pulse 87  Temp(Src) 98.1 F (36.7 C) (Oral)  SpO2 100% Physical Exam  Nursing note and vitals reviewed. Constitutional: She appears well-developed and well-nourished. No distress.  HENT:  Head: Normocephalic and atraumatic.  Eyes: Pupils are equal, round, and reactive to light.  Neck: Normal range of motion. Neck supple.  Cardiovascular: Normal rate and regular rhythm.   Pulmonary/Chest: Effort normal.  Abdominal: Soft.  Neurological: She is alert.  Skin: Skin is warm and dry.    ED Course  Procedures (including critical care time) Labs Review Labs Reviewed  COMPREHENSIVE METABOLIC PANEL - Abnormal; Notable for the following:    Sodium 131 (*)    Potassium 3.0 (*)    Chloride 93 (*)    BUN 4 (*)    Creatinine, Ser 0.45 (*)    Albumin 3.3 (*)    All other components within normal limits  CBC WITH DIFFERENTIAL - Abnormal; Notable for the following:    WBC 2.8 (*)    RBC 3.74 (*)    Hemoglobin 11.5 (*)    HCT 33.2 (*)    Neutrophils Relative % 25 (*)    Lymphocytes Relative 50 (*)    Monocytes Relative 21 (*)    Neutro Abs 0.7 (*)    All other components within normal limits  LIPASE, BLOOD - Abnormal; Notable for the following:    Lipase 77 (*)    All other components within normal limits  URINALYSIS, ROUTINE W  REFLEX MICROSCOPIC -  Abnormal; Notable for the following:    Ketones, ur 15 (*)    All other components within normal limits   Imaging Review No results found.  MDM   1. Vomiting    Patient unable to tolerate abx. Her urinalysis shows that she does not currently have UTI. Will dc abx. Her potassium is low, 40 meq PO given in ED for replacement. Labs otherwise reassuring. Dr. Anitra Lauth has seen patient as  Well and family is comfortable taking her home and patient is happy about it well. After 1 L fluids, nausea medication she feels much better and tolerated drinking a cup of sprite.   Plan: 1. Dc Macrobid 2. Rx Zofran 3. Follow dietary plan as recommended by hospitalist. 4. Return to the ED as needed.  67 y.o.[name]'s evaluation in the Emergency Department is complete. It has been determined that no acute conditions requiring further emergency intervention are present at this time. The patient/guardian have been advised of the diagnosis and plan. We have discussed signs and symptoms that warrant return to the ED, such as changes or worsening in symptoms.  Vital signs are stable at discharge. Filed Vitals:   09/13/13 1810  BP: 167/89  Pulse: 87  Temp:     Patient/guardian has voiced understanding and agreed to follow-up with the PCP or specialist.     Dorthula Matas, PA-C 09/13/13 2004

## 2013-09-13 NOTE — ED Notes (Signed)
Pt reports she was here on Monday and admitted for 2 days, told she had pancreatitis and a UTI. Pt sts the culture hadn't come back before she was d/c, was called the next morning and told to take another antibx, sts she had tried twice to take the meds but vomited them up both times. Pt took 5mg  hydrocodone this morning for her HA, no relief. Pt sts she then tried tylenol later on the in the afternoon but wasn't able to keep that down either. Pt reports she is a little nauseous now. Pt c/o bilateral shoulder pain, sts the left shoulder is slightly worse, feels like cramping pain has been going on since she was admitted. Pt is wearing a back brace, sts 4 weeks ago she had back sx, about a week after that is when she started to get the UTI symptoms. Pt sts she has seen her back surgeon for a follow up and was told that everything with that looks great. Pt sts she does feel like her pancreatitis pain has improved greatly since Monday. Pt reports she does have generalized weakness. Pt in nad, skin warm and dry, resp e/u.

## 2013-09-14 LAB — CULTURE, BLOOD (ROUTINE X 2): Culture: NO GROWTH

## 2013-10-03 ENCOUNTER — Encounter: Payer: Self-pay | Admitting: Gastroenterology

## 2013-10-03 ENCOUNTER — Ambulatory Visit (INDEPENDENT_AMBULATORY_CARE_PROVIDER_SITE_OTHER): Payer: Medicare Other | Admitting: Gastroenterology

## 2013-10-03 VITALS — BP 128/70 | HR 71 | Ht 63.0 in | Wt 112.8 lb

## 2013-10-03 DIAGNOSIS — K859 Acute pancreatitis without necrosis or infection, unspecified: Secondary | ICD-10-CM

## 2013-10-03 NOTE — Progress Notes (Signed)
History of Present Illness:  Pleasant 67 year old white female here following recent hospitalization for acute pancreatitis.  Etiology was not determined.  Ultrasound was negative.  Patient does not drink.  There are no obvious medications causing pancreatitis.  Since discharge about 2 weeks ago she's had 2 very limited episodes of pain reminiscent of her pancreatic pain, one as rather severe but short lived.  She complains of some weakness but this is improving.    Past Medical History  Diagnosis Date  . HTN (hypertension)   . Glaucoma   . Sjogren's syndrome   . Raynaud phenomenon   . Osteopenia   . Polymyositis   . GERD (gastroesophageal reflux disease)   . Swallowing difficulty     unable to swallow whole pills  . AAA (abdominal aortic aneurysm)     small      report in epic 11/11 states no aaa    . Iron deficiency anemia     hx  . Unspecified deficiency anemia 03/14/2013  . Family history of anesthesia complication     "sister has severe sleep apnea; she has alot of trouble w/anesthesia" (09/08/2013)  . High cholesterol     "not severe; can't take the medicine" (09/08/2013)  . Polymyositis   . Multiple thyroid nodules   . Migraines     "hurt like a migraine; don't get them like I did when I was younger" (09/08/2013)  . OA (osteoarthritis)   . Chronic lower back pain     "for 6 years; relieved since back OR 07/2013" (09/08/2013)   Past Surgical History  Procedure Laterality Date  . Cataract extraction w/ intraocular lens  implant, bilateral Bilateral 12/2010-01/2011  . Esophageal dilation      "a few times before 02/2010; not since" (09/08/2013)  . Repair zenker's diverticula  02/2010    "diverticulectomy w/zenker's pouch correction" (09/08/2013)  . Dental surgery  11/2011-12/2011    one on each side in my bottom jaw; dental implants" (09/08/2013)  . Lumbar fusion  Aug 14, 2013    Dr. Blanche East   family history includes Arthritis in her maternal uncle and sister; Breast cancer in her  father; Cancer in her father and mother; Colon cancer in her father; Diabetes in her sister and son; Heart disease in her brother and father; Hypertension in her father; Ovarian cancer in her mother; Psoriasis in her sister; Sarcoidosis in her sister. Current Outpatient Prescriptions  Medication Sig Dispense Refill  . HYDROcodone-acetaminophen (HYCET) 7.5-325 mg/15 ml solution Take 5 mLs by mouth every 4 (four) hours as needed for pain.      . methocarbamol (ROBAXIN) 500 MG tablet Take 500 mg by mouth 2 (two) times daily as needed (muscle spasm).      Marland Kitchen omeprazole (PRILOSEC) 20 MG capsule Take 20 mg by mouth 2 (two) times daily.       . phenazopyridine (PYRIDIUM) 95 MG tablet Take 190 mg by mouth 3 (three) times daily as needed for pain.      Marland Kitchen timolol (BETIMOL) 0.5 % ophthalmic solution Place 1 drop into both eyes at bedtime.      . traMADol (ULTRAM) 50 MG tablet Take 50 mg by mouth daily as needed for pain.       No current facility-administered medications for this visit.   Allergies as of 10/03/2013  . (No Known Allergies)    reports that she has never smoked. She has never used smokeless tobacco. She reports that she does not drink alcohol or use illicit  drugs.     Review of Systems: Pertinent positive and negative review of systems were noted in the above HPI section. All other review of systems were otherwise negative.  Vital signs were reviewed in today's medical record Physical Exam: General: Well developed , well nourished, no acute distress Exam was limited because patient is in a full body brace for her recent back surgery. Chest is clear There are no cardiac murmurs, gallops or rubs Psychological:  Alert and cooperative. Normal mood and affect

## 2013-10-03 NOTE — Assessment & Plan Note (Signed)
Recent acute pancreatitis of unclear etiology.  Gallstone pancreatitis remains a concern although no stones were seen.  It is noteworthy that she has had 2 very limited episodes of pain since her hospital discharge.  Recommendations #1 MRCP

## 2013-10-03 NOTE — Patient Instructions (Signed)
You have been scheduled for an MRI at [facility] on 10/10/2013. Your appointment time is 10:00am. Please arrive 15 minutes prior to your appointment time for registration purposes. There is no prep for this test. However, if you have any metal in your body, have a pacemaker or defibrillator, please be sure to let your ordering physician know. This test typically takes 45 minutes to 1 hour to complete.

## 2013-10-10 ENCOUNTER — Telehealth: Payer: Self-pay | Admitting: Gastroenterology

## 2013-10-10 ENCOUNTER — Ambulatory Visit (HOSPITAL_COMMUNITY): Admission: RE | Admit: 2013-10-10 | Payer: Medicare Other | Source: Ambulatory Visit

## 2013-10-10 NOTE — Telephone Encounter (Signed)
Pt just wanted [place] to know she had to r/s her MRCP because the prior auth is pending. Informed her I will check on it on Monday. Pt stated understanding.

## 2013-10-14 ENCOUNTER — Encounter: Payer: Self-pay | Admitting: Cardiovascular Disease

## 2013-10-15 ENCOUNTER — Encounter: Payer: Self-pay | Admitting: Cardiovascular Disease

## 2013-10-15 ENCOUNTER — Ambulatory Visit (INDEPENDENT_AMBULATORY_CARE_PROVIDER_SITE_OTHER): Payer: Medicare Other | Admitting: Cardiovascular Disease

## 2013-10-15 VITALS — BP 138/92 | HR 73 | Ht 59.0 in | Wt 107.7 lb

## 2013-10-15 DIAGNOSIS — I10 Essential (primary) hypertension: Secondary | ICD-10-CM

## 2013-10-15 DIAGNOSIS — M7989 Other specified soft tissue disorders: Secondary | ICD-10-CM

## 2013-10-15 NOTE — Patient Instructions (Signed)
Dr. Allyson Sabal will see as needed.

## 2013-10-15 NOTE — Assessment & Plan Note (Signed)
The patient was on Benicar in the past however since her episode of pancreatitis several months ago her blood pressure has been running low and Dr. Renne Crigler , her primary care physician, has taken her off antihypertensive medications. Her blood pressures which measures at home have been normal.

## 2013-10-15 NOTE — Assessment & Plan Note (Signed)
She notices more prominently after her consultation for pancreatitis back in September. She has dependent edema worse at the end of the day. She really denies shortness of breath or chest pain. There is a question of a murmur in the past however I do not hear one today and see no need to perform 2-D echocardiography.

## 2013-10-15 NOTE — Progress Notes (Signed)
10/15/2013 [name]   [birth year]  [id number]  Primary Physician Sabrina Moh, MD Primary Cardiologist: Sabrina Gess MD [name]   HPI:  [name] is a delightful 66 year old thin appearing married Caucasian female mother of 4 sons daughter mother graduate and is accompanied by her husband today. Her husband was a patient of Dr. Nanine Marshall and currently is being seen by Dr. Marca Marshall. She was referred through the courtesy of Dr. Merri Marshall for evaluation of shortness of breath and lower extremity edema.she really has no cardiac risk factors other than hypertension in the past however she is no longer on medications for this. She is not diabetic nor is hyperlipidemic. There is a family history of heart disease and brother who had microinfarction at age 84. Her father did have a bout for placement at [facility] in his later years. She denies chest pain or shortness of breath. She did notice ankle edema more prominently after her hospitalization in September for pancreatitis. His abdominally dependent is worse at the end of the day. There was also question of a murmur that was auscultated during her hospitalization.   Current Outpatient Prescriptions  Medication Sig Dispense Refill  . hydroxychloroquine (PLAQUENIL) 200 MG tablet Take 200 mg by mouth 2 (two) times daily.      . timolol (BETIMOL) 0.5 % ophthalmic solution Place 1 drop into both eyes at bedtime.      . traMADol (ULTRAM) 50 MG tablet Take 50 mg by mouth daily as needed for pain.       No current facility-administered medications for this visit.    No Known Allergies  History   Social History  . Marital Status: Married    Spouse Name: N/A    Number of Children: N/A  . Years of Education: N/A   Occupational History  . Not on file.   Social History Main Topics  . Smoking status: Never Smoker   . Smokeless tobacco: Never Used  . Alcohol Use: No  . Drug Use: No  . Sexual  Activity: Yes    Birth Control/ Protection: Post-menopausal   Other Topics Concern  . Not on file   Social History Narrative  . No narrative on file     Review of Systems: General: negative for chills, fever, night sweats or weight changes.  Cardiovascular: negative for chest pain, dyspnea on exertion, edema, orthopnea, palpitations, paroxysmal nocturnal dyspnea or shortness of breath Dermatological: negative for rash Respiratory: negative for cough or wheezing Urologic: negative for hematuria Abdominal: negative for nausea, vomiting, diarrhea, bright red blood per rectum, melena, or hematemesis Neurologic: negative for visual changes, syncope, or dizziness All other systems reviewed and are otherwise negative except as noted above.    Blood pressure 138/92, pulse 73, height 4\' 11"  (1.499 m), weight 107 lb 11.2 oz (48.852 kg).  General appearance: alert and no distress Neck: no adenopathy, no carotid bruit, no JVD, supple, symmetrical, trachea midline and thyroid not enlarged, symmetric, no tenderness/mass/nodules Lungs: clear to auscultation bilaterally Heart: regular rate and rhythm, S1, S2 normal, no murmur, click, rub or gallop Extremities: extremities normal, atraumatic, no cyanosis or edema Pulses: 2+ and symmetric  EKG normal sinus rhythm at 73 with nonspecific ST and T-wave changes. There were insignificant Q waves in the inferior and lateral leads.  ASSESSMENT AND PLAN:   Essential hypertension The patient was on Benicar in the past however since her episode of pancreatitis several months ago her blood pressure has  been running low and Dr. Renne Marshall , her primary care physician, has taken her off antihypertensive medications. Her blood pressures which measures at home have been normal.  Swelling of left lower extremity She notices more prominently after her consultation for pancreatitis back in September. She has dependent edema worse at the end of the day. She really  denies shortness of breath or chest pain. There is a question of a murmur in the past however I do not hear one today and see no need to perform 2-D echocardiography.      Sabrina Gess MD FACP,FACC,FAHA, [facility] 10/15/2013 11:02 AM

## 2013-10-17 ENCOUNTER — Telehealth: Payer: Self-pay | Admitting: Gastroenterology

## 2013-10-17 NOTE — Telephone Encounter (Signed)
Spoke with pt and he MR of abdomen MRCP h/o acute pancreatitis was denied. Insurance co needs a peer to peer done. Case number [id number]. Phone number is [phone]. Let pt know we would contact her on Monday with an update.

## 2013-10-17 NOTE — Telephone Encounter (Signed)
See additional phone note. 

## 2013-10-19 ENCOUNTER — Encounter (HOSPITAL_COMMUNITY): Payer: Self-pay | Admitting: Emergency Medicine

## 2013-10-19 ENCOUNTER — Emergency Department (HOSPITAL_COMMUNITY): Payer: Medicare Other

## 2013-10-19 ENCOUNTER — Emergency Department (HOSPITAL_COMMUNITY)
Admission: EM | Admit: 2013-10-19 | Discharge: 2013-10-19 | Disposition: A | Discharge status: Home / Self Care | Payer: Medicare Other | Attending: Emergency Medicine | Admitting: Emergency Medicine

## 2013-10-19 DIAGNOSIS — S0101XA Laceration without foreign body of scalp, initial encounter: Secondary | ICD-10-CM

## 2013-10-19 DIAGNOSIS — W1809XA Striking against other object with subsequent fall, initial encounter: Secondary | ICD-10-CM | POA: Insufficient documentation

## 2013-10-19 DIAGNOSIS — I10 Essential (primary) hypertension: Secondary | ICD-10-CM | POA: Insufficient documentation

## 2013-10-19 DIAGNOSIS — Z8669 Personal history of other diseases of the nervous system and sense organs: Secondary | ICD-10-CM | POA: Insufficient documentation

## 2013-10-19 DIAGNOSIS — S0100XA Unspecified open wound of scalp, initial encounter: Secondary | ICD-10-CM | POA: Insufficient documentation

## 2013-10-19 DIAGNOSIS — M129 Arthropathy, unspecified: Secondary | ICD-10-CM | POA: Insufficient documentation

## 2013-10-19 DIAGNOSIS — Z8639 Personal history of other endocrine, nutritional and metabolic disease: Secondary | ICD-10-CM | POA: Insufficient documentation

## 2013-10-19 DIAGNOSIS — Z79899 Other long term (current) drug therapy: Secondary | ICD-10-CM | POA: Insufficient documentation

## 2013-10-19 DIAGNOSIS — G8929 Other chronic pain: Secondary | ICD-10-CM | POA: Insufficient documentation

## 2013-10-19 DIAGNOSIS — Z8719 Personal history of other diseases of the digestive system: Secondary | ICD-10-CM | POA: Insufficient documentation

## 2013-10-19 DIAGNOSIS — Y9301 Activity, walking, marching and hiking: Secondary | ICD-10-CM | POA: Insufficient documentation

## 2013-10-19 DIAGNOSIS — W19XXXA Unspecified fall, initial encounter: Secondary | ICD-10-CM

## 2013-10-19 DIAGNOSIS — Z862 Personal history of diseases of the blood and blood-forming organs and certain disorders involving the immune mechanism: Secondary | ICD-10-CM | POA: Insufficient documentation

## 2013-10-19 DIAGNOSIS — Y9229 Other specified public building as the place of occurrence of the external cause: Secondary | ICD-10-CM | POA: Insufficient documentation

## 2013-10-19 HISTORY — DX: Acute pancreatitis without necrosis or infection, unspecified: K85.90

## 2013-10-19 NOTE — ED Notes (Signed)
Pt arrived by [facility]. Pt fell and hit head. Lac to posterior side of head. Bleeding controlled. Pt had back surgery at the end of August this year.

## 2013-10-19 NOTE — ED Provider Notes (Signed)
CSN: [id number]     Arrival date & time 10/19/13  1526 History   First MD Initiated Contact with Patient 10/19/13 1527     Chief Complaint  Patient presents with  . Fall   (Consider location/radiation/quality/duration/timing/severity/associated sxs/prior Treatment) HPI Comments: Patient presents today after a fall.  She reports that while walking in to a restaurant her walker became stuck on the doorway causing her walker to fall forward, which caused her to loose her balance and fall.  Fall occurred just prior to arrival.  She reports that she landed on her left side when she fell and hit her head on the edge of the doorway.  She denies LOC.  She is currently complaining of a headache and has a laceration to the posterior scalp.  She recently had surgery on her lumbar spine approximately 2 months ago and was wearing her back brace at the time of the fall.  She denies any pain of her back, neck, or extremities at this time.  She is currently not on any anticoagulants.  She denies nausea, vomiting, or vision changes.  No numbness or tingling.    The history is provided by the patient.    Past Medical History  Diagnosis Date  . HTN (hypertension)   . Glaucoma   . Sjogren's syndrome   . Raynaud phenomenon   . Osteopenia   . Polymyositis   . GERD (gastroesophageal reflux disease)   . Swallowing difficulty     unable to swallow whole pills  . AAA (abdominal aortic aneurysm)     small      report in epic 11/11 states no aaa    . Iron deficiency anemia     hx  . Unspecified deficiency anemia 03/14/2013  . Family history of anesthesia complication     "sister has severe sleep apnea; she has alot of trouble w/anesthesia" (09/08/2013)  . High cholesterol     "not severe; can't take the medicine" (09/08/2013)  . Polymyositis   . Multiple thyroid nodules   . Migraines     "hurt like a migraine; don't get them like I did when I was younger" (09/08/2013)  . OA (osteoarthritis)   . Chronic lower  back pain     "for 6 years; relieved since back OR 07/2013" (09/08/2013)  . Swelling of left lower extremity   . Pancreatitis    Past Surgical History  Procedure Laterality Date  . Cataract extraction w/ intraocular lens  implant, bilateral Bilateral 12/2010-01/2011  . Esophageal dilation      "a few times before 02/2010; not since" (09/08/2013)  . Repair zenker's diverticula  02/2010    "diverticulectomy w/zenker's pouch correction" (09/08/2013)  . Dental surgery  11/2011-12/2011    one on each side in my bottom jaw; dental implants" (09/08/2013)  . Lumbar fusion  Aug 14, 2013    Dr. Blanche East   Family History  Problem Relation Age of Onset  . Cancer Mother     ovarian  . Ovarian cancer Mother   . Cancer Father     colon and breast  . Hypertension Father   . Heart disease Father   . Breast cancer Father     Age 38  . Colon cancer Father   . Psoriasis Sister   . Sarcoidosis Sister   . Arthritis Sister     psoriatic arthritis  . Diabetes Sister   . Arthritis Maternal Uncle     rheumatiod arthritis  . Diabetes Son   .  Heart disease Brother    History  Substance Use Topics  . Smoking status: Never Smoker   . Smokeless tobacco: Never Used  . Alcohol Use: No   OB History   Grav Para Term Preterm Abortions TAB SAB Ect Mult Living   4 4 4       4      Review of Systems  Neurological: Positive for headaches.  All other systems reviewed and are negative.    Allergies  Review of patient's allergies indicates no known allergies.  Home Medications   Current Outpatient Rx  Name  Route  Sig  Dispense  Refill  . HYDROcodone-acetaminophen (NORCO) 7.5-325 MG per tablet   Oral   Take 1 tablet by mouth every 6 (six) hours as needed for pain.         . hydroxychloroquine (PLAQUENIL) 200 MG tablet   Oral   Take 200 mg by mouth 2 (two) times daily.         . Tetrahydrozoline HCl (VISINE OP)   Both Eyes   Place 1 drop into both eyes daily as needed (dry eyes).         .  timolol (BETIMOL) 0.5 % ophthalmic solution   Both Eyes   Place 1 drop into both eyes at bedtime.         . traMADol (ULTRAM) 50 MG tablet   Oral   Take 50 mg by mouth daily as needed for pain.          BP 161/89  Pulse 67  Temp(Src) 98.6 F (37 C) (Oral)  Resp 16  SpO2 100% Physical Exam  Nursing note and vitals reviewed. Constitutional: She appears well-developed and well-nourished.  HENT:  Head: Normocephalic.    Mouth/Throat: Oropharynx is clear and moist.  Eyes: EOM are normal. Pupils are equal, round, and reactive to light.  Neck: Normal range of motion. Neck supple.  Cardiovascular: Normal rate, regular rhythm and normal heart sounds.   Pulmonary/Chest: Effort normal and breath sounds normal.  Musculoskeletal: Normal range of motion.       Cervical back: She exhibits normal range of motion, no tenderness, no bony tenderness, no swelling, no edema and no deformity.  Normal ROM of all extremities without pain  Neurological: She is alert. She has normal strength. No cranial nerve deficit or sensory deficit. Coordination normal.  Skin: Skin is warm and dry.  Psychiatric: She has a normal mood and affect.   LACERATION REPAIR Performed by: Santiago Glad Authorized by: Santiago Glad Consent: Verbal consent obtained. Risks and benefits: risks, benefits and alternatives were discussed Consent given by: patient Patient identity confirmed: provided demographic data Prepped and Draped in normal sterile fashion Wound explored  Laceration Location: posterior scalp  Laceration Length: 1 cm  No Foreign Bodies seen or palpated  Anesthesia: local infiltration  Local anesthetic: lidocaine 2% with epinephrine  Anesthetic total: 10 ml  Irrigation method: syringe Amount of cleaning: standard  Skin closure: stapes  Number of staples:  2  Technique: staple  Patient tolerance: Patient tolerated the procedure well with no immediate complications.   ED Course   Procedures (including critical care time) Labs Review Labs Reviewed - No data to display Imaging Review Ct Head Wo Contrast  10/19/2013   CLINICAL DATA:  Traumatic injury with headache  EXAM: CT HEAD WITHOUT CONTRAST  TECHNIQUE: Contiguous axial images were obtained from the base of the skull through the vertex without intravenous contrast.  COMPARISON:  None.  FINDINGS: The bony  calvarium is intact. The ventricles are normal in size and configuration. No findings to suggest acute hemorrhage, acute infarction or space-occupying mass lesion are noted.  IMPRESSION: No acute abnormality seen.   Electronically Signed   By: Alcide Clever M.D.   On: 10/19/2013 18:42    EKG Interpretation   None      Patient discussed with and also evaluated by Dr. Micheline Maze  MDM  No diagnosis found. Patient presenting after a mechanical fall.  She hit her head on the doorway and sustained a laceration to the posterior scalp.  Patient also with what appeared to be an arterial bleed.  Pressure applied with quit clot dressing and patient also given Lidocaine with epinephrine, which did resolve the bleeding.  Laceration repaired with staples.  CT head negative for acute findings.  Patient moving extremities without pain.  No neck or back pain.  Patient stable for discharge.      Santiago Glad, PA-C 10/20/13 [phone]

## 2013-10-20 ENCOUNTER — Ambulatory Visit (HOSPITAL_COMMUNITY): Admission: RE | Admit: 2013-10-20 | Payer: Medicare Other | Source: Ambulatory Visit

## 2013-10-20 NOTE — ED Provider Notes (Signed)
Medical screening examination/treatment/procedure(s) were conducted as a shared visit with non-physician practitioner(s) and myself.  I personally evaluated the patient during the encounter. Pt presents w/ persistently bleeding scalp laceration after fall into glass door.  No LOC, GCS 15, pt in NAD, VSS, no focal neuro findings.  Bleeding controlled after staples, pressure, and quick clot. CT head unremarkable. Pt safe for close PCP f/u.   EKG Interpretation   None         Shanna Cisco, MD 10/20/13 1547

## 2013-10-21 NOTE — Telephone Encounter (Signed)
Dr. Arlyce Dice please call for the prior auth for MRCP, see below.

## 2013-10-21 NOTE — Telephone Encounter (Signed)
D/c dexilant. Try protonix 40 mg bid Did she try clinoril?

## 2013-10-21 NOTE — Telephone Encounter (Signed)
Disregard previous note - this was for another patient

## 2013-10-22 ENCOUNTER — Encounter: Payer: Self-pay | Admitting: Gastroenterology

## 2013-10-22 NOTE — Telephone Encounter (Signed)
I have dictated a clinical note for the insurance company which I would like you to fax.  See under "documents" Fax [phone] Please include the case number

## 2013-10-22 NOTE — Progress Notes (Unsigned)
Patient ID: [name], female   DOB: 03/16/1946, 67 y.o.   MRN: [id number]  Case number [id number]  To Whom It May Concern,  This patient was hospitalized for acute pancreatitis of unknown etiology.  Ultrasound did not demonstrate gallbladder stones or pancreatic abnormalities.  Since discharge she's had recurrent pain.  MRCP is requested to rule out choledocholithiasis, pancreatic divisum, and pancreatic neoplasm.  Sincerely, Barbette Hair. Arlyce Dice, M.D.

## 2013-10-22 NOTE — Telephone Encounter (Signed)
Letter printed out and faxed to number below including the case number.

## 2013-10-23 ENCOUNTER — Other Ambulatory Visit: Payer: Self-pay

## 2013-10-23 ENCOUNTER — Telehealth: Payer: Self-pay

## 2013-10-23 DIAGNOSIS — K859 Acute pancreatitis without necrosis or infection, unspecified: Secondary | ICD-10-CM

## 2013-10-23 NOTE — Telephone Encounter (Signed)
Pt scheduled for MRCP at [facility] 10/28/13. Pt to arrive there at 7:45pm for an 8pm appt. Pt to be NPO after 4pm. Pt to come for labs prior to her appt. Pt aware.

## 2013-10-24 ENCOUNTER — Other Ambulatory Visit (INDEPENDENT_AMBULATORY_CARE_PROVIDER_SITE_OTHER): Payer: Medicare Other

## 2013-10-24 DIAGNOSIS — K859 Acute pancreatitis without necrosis or infection, unspecified: Secondary | ICD-10-CM

## 2013-10-24 LAB — COMPREHENSIVE METABOLIC PANEL
ALT: 11 U/L (ref 0–35)
Alkaline Phosphatase: 55 U/L (ref 39–117)
BUN: 10 mg/dL (ref 6–23)
CO2: 28 mEq/L (ref 19–32)
Calcium: 9 mg/dL (ref 8.4–10.5)
Chloride: 102 mEq/L (ref 96–112)
Creatinine, Ser: 0.4 mg/dL (ref 0.4–1.2)
GFR: 185.69 mL/min (ref >60.00)
Total Bilirubin: 0.6 mg/dL (ref 0.3–1.2)

## 2013-10-28 ENCOUNTER — Ambulatory Visit (HOSPITAL_COMMUNITY)
Admission: RE | Admit: 2013-10-28 | Discharge: 2013-10-28 | Disposition: A | Discharge status: Home / Self Care | Payer: Medicare Other | Source: Ambulatory Visit | Attending: Gastroenterology | Admitting: Gastroenterology

## 2013-10-28 ENCOUNTER — Other Ambulatory Visit: Payer: Self-pay | Admitting: Gastroenterology

## 2013-10-28 DIAGNOSIS — K859 Acute pancreatitis without necrosis or infection, unspecified: Secondary | ICD-10-CM

## 2013-10-28 DIAGNOSIS — K7689 Other specified diseases of liver: Secondary | ICD-10-CM | POA: Insufficient documentation

## 2013-10-28 DIAGNOSIS — I517 Cardiomegaly: Secondary | ICD-10-CM | POA: Insufficient documentation

## 2013-10-28 MED ORDER — GADOBENATE DIMEGLUMINE 529 MG/ML IV SOLN
8.0000 mL | Freq: Once | INTRAVENOUS | Status: AC | PRN
  Administered 2013-10-28: 8 mL via INTRAVENOUS

## 2013-10-29 NOTE — Progress Notes (Signed)
Quick Note:  Please inform the patient that MRI was normal and to continue current plan of action ______ 

## 2013-11-19 ENCOUNTER — Ambulatory Visit: Payer: Medicare Other | Admitting: Cardiology

## 2014-04-15 ENCOUNTER — Other Ambulatory Visit: Payer: Self-pay

## 2014-04-15 DIAGNOSIS — Z1231 Encounter for screening mammogram for malignant neoplasm of breast: Secondary | ICD-10-CM

## 2014-05-19 ENCOUNTER — Ambulatory Visit: Payer: Medicare Other

## 2014-06-01 ENCOUNTER — Ambulatory Visit: Payer: Self-pay | Admitting: Gynecology

## 2014-06-02 ENCOUNTER — Ambulatory Visit
Admission: RE | Admit: 2014-06-02 | Discharge: 2014-06-02 | Disposition: A | Discharge status: Home / Self Care | Payer: Medicare HMO | Source: Ambulatory Visit

## 2014-06-02 DIAGNOSIS — Z1231 Encounter for screening mammogram for malignant neoplasm of breast: Secondary | ICD-10-CM

## 2014-06-22 ENCOUNTER — Encounter: Payer: Self-pay | Admitting: Gynecology

## 2014-06-22 ENCOUNTER — Other Ambulatory Visit (HOSPITAL_COMMUNITY)
Admission: RE | Admit: 2014-06-22 | Discharge: 2014-06-22 | Disposition: A | Discharge status: Home / Self Care | Payer: Medicare HMO | Source: Ambulatory Visit | Attending: Gynecology | Admitting: Gynecology

## 2014-06-22 ENCOUNTER — Ambulatory Visit (INDEPENDENT_AMBULATORY_CARE_PROVIDER_SITE_OTHER): Payer: Medicare HMO | Admitting: Gynecology

## 2014-06-22 VITALS — BP 120/76 | Ht 60.0 in | Wt 116.0 lb

## 2014-06-22 DIAGNOSIS — M810 Age-related osteoporosis without current pathological fracture: Secondary | ICD-10-CM

## 2014-06-22 DIAGNOSIS — Z124 Encounter for screening for malignant neoplasm of cervix: Secondary | ICD-10-CM

## 2014-06-22 DIAGNOSIS — N8111 Cystocele, midline: Secondary | ICD-10-CM

## 2014-06-22 DIAGNOSIS — N814 Uterovaginal prolapse, unspecified: Secondary | ICD-10-CM

## 2014-06-22 NOTE — Patient Instructions (Signed)
Followup in 1 year. Followup sooner if you want to consider the use of a pessary.

## 2014-06-22 NOTE — Progress Notes (Signed)
[name] 1946-06-20 [id number]        68 y.o.  [id number] for followup exam. Former patient of Dr. Cherylann Banas. Has not been seen for over 2 years. Several issues noted below.  Past medical history,surgical history, problem list, medications, allergies, family history and social history were all reviewed and documented as reviewed in the EPIC chart.  ROS:  12 system ROS performed with pertinent positives and negatives included in the history, assessment and plan.   Additional significant findings :  None   Exam: [name] Vitals:   06/22/14 1159  BP: 120/76  Height: 5' (1.524 m)  Weight: 116 lb (52.617 kg)   General appearance:  Normal affect, orientation and appearance. Skin: Grossly normal HEENT: Without gross lesions.  No cervical or supraclavicular adenopathy. Thyroid normal.  Lungs:  Clear without wheezing, rales or rhonchi Cardiac: RR, without RMG Abdominal:  Soft, nontender, without masses, guarding, rebound, organomegaly or hernia Breasts:  Examined lying and sitting without masses, retractions, discharge or axillary adenopathy. Pelvic:  Ext/BUS/vagina with generalized atrophic changes. Second degree cystocele with straining. First degree uterine prolapse. Slight rectocele.  Cervix grossly normal with atrophic changes. Pap done  Uterus anteverted, normal size, shape and contour, midline and mobile nontender   Adnexa  Without masses or tenderness    Anus and perineum  Normal   Rectovaginal  Normal sphincter tone without palpated masses or tenderness.    Assessment/Plan:  68 y.o. [id number] female for followup exam.   1. Cystocele/uterine prolapse. Present for years. Seems to be a little worse this past year after a bout of constipation. No discomfort or pain but does feel protruding after bearing down or on her feet. Options to include observation, pessary and surgery discussed. Patient not interested in doing anything at this point but watching. She'll followup if  she wants to consider a trial of pessary. Not having significant issues for his incontinence. 2. Osteoporosis. Patient reports last bone density at Dr. Pennie Banter office showed osteoporosis. She apparently had been treated with Fosamax a number of years ago was on a drug-free holiday. He did not talk about treatment at this time. She does have an appointment to see him coming up and have asked her to followup with him and specifically asked as to whether they would consider treatment based on her last bone density which I do not have a copy of. I did review treatment options to include the bisphosphonates Prolia Reclast. Risks to include GERD, esophageal disease, osteonecrosis of the jaw and atypical fractures all reviewed. Rashes and increased infection rate with Prolia also discussed. Patient is going to followup with Dr. Shelia Media to discuss this with him and I emphasized importance to do so. 3. Mammography 05/2014. Patient has a family history to include her father in his 67s developing breast cancer and 2 maternal aunts in their 79s also with breast cancer. Her mother had ovarian cancer apparently had discussed screening options to include pelvic ultrasounds with Dr. Cherylann Banas. I reviewed the strong possibility of a genetic linkage from her father's side and recommended she discuss with a genetic counselor and consider genetic testing. Benefits as far as prophylactic surgeries or increased surveillance such as breast MRI pelvic ultrasounds discussed. Patient declines any followup at this point. Does not want to consider genetic testing nor increased surveillance such as ultrasound or MRI. She understands if genetic positive she would have a substantial risk of breast cancer and ovarian cancer. Continue with annual mammography. SBE monthly reviewed. 4. Pap  smear 2012. Pap smear done today. No history of significant abnormal Pap smears. Options to stop screening altogether she is over the age of 48 versus less  frequent screening intervals reviewed. Will readdress on an annual basis. 5. Colonoscopy 2013. Repeat at their recommended interval. 6. Health maintenance. No lab work done and she reports this all done through Dr. Shelia Media office. Followup in one year, sooner if prolapse becomes more of an issue.  Note: This document was prepared with digital dictation and possible smart phrase technology. Any transcriptional errors that result from this process are unintentional.   [name] MD, 1:05 PM 06/22/2014

## 2014-06-24 LAB — CYTOLOGY - PAP

## 2014-10-19 ENCOUNTER — Encounter: Payer: Self-pay | Admitting: Gynecology

## 2015-03-15 IMAGING — CR DG CHEST 2V
2 series · 2 of 2 positions shown · non-contrast
Comparison: 02/14/2010

CLINICAL DATA: Preadmission for back surgery

CHEST - 2 VIEW

[w chest pa]
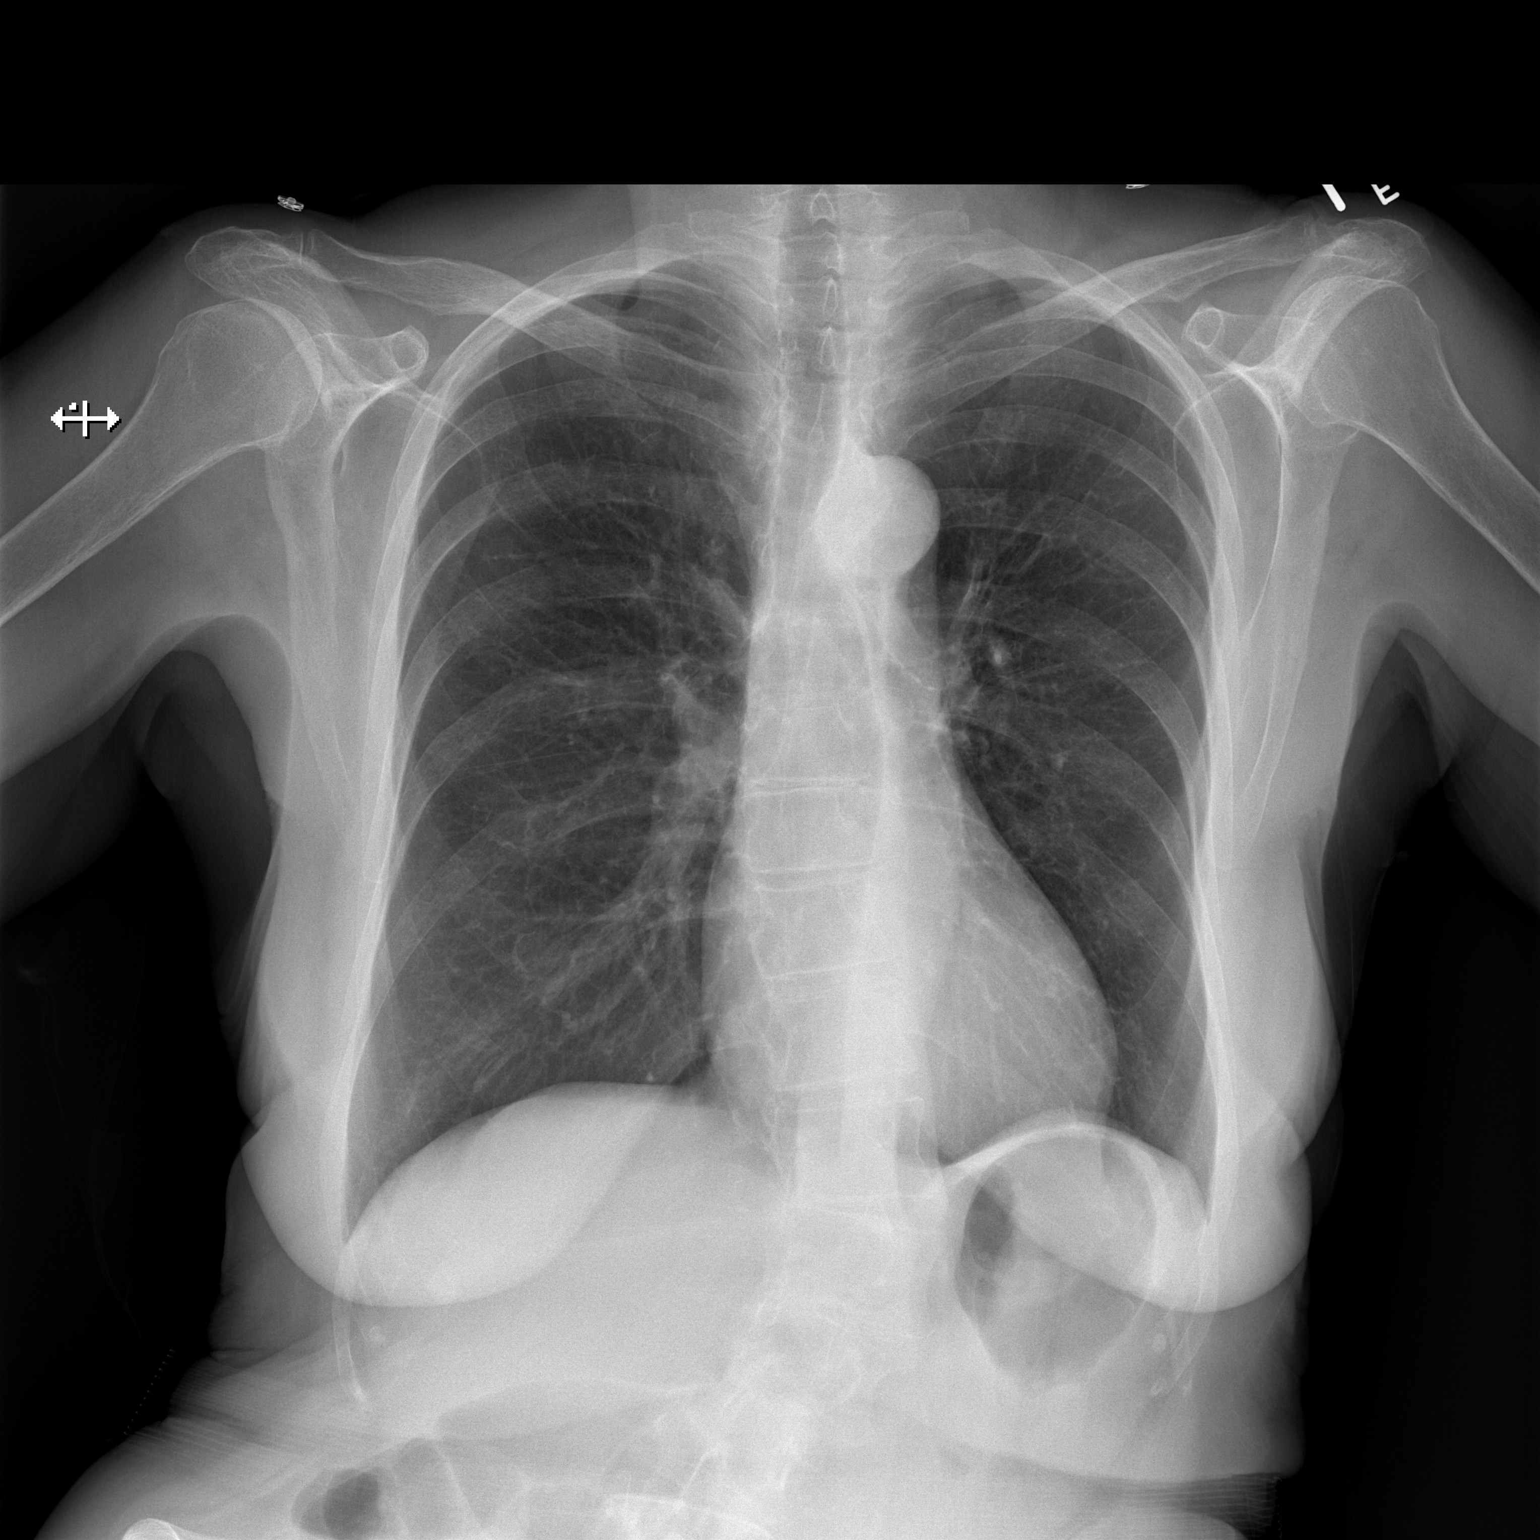

[w chest lat]
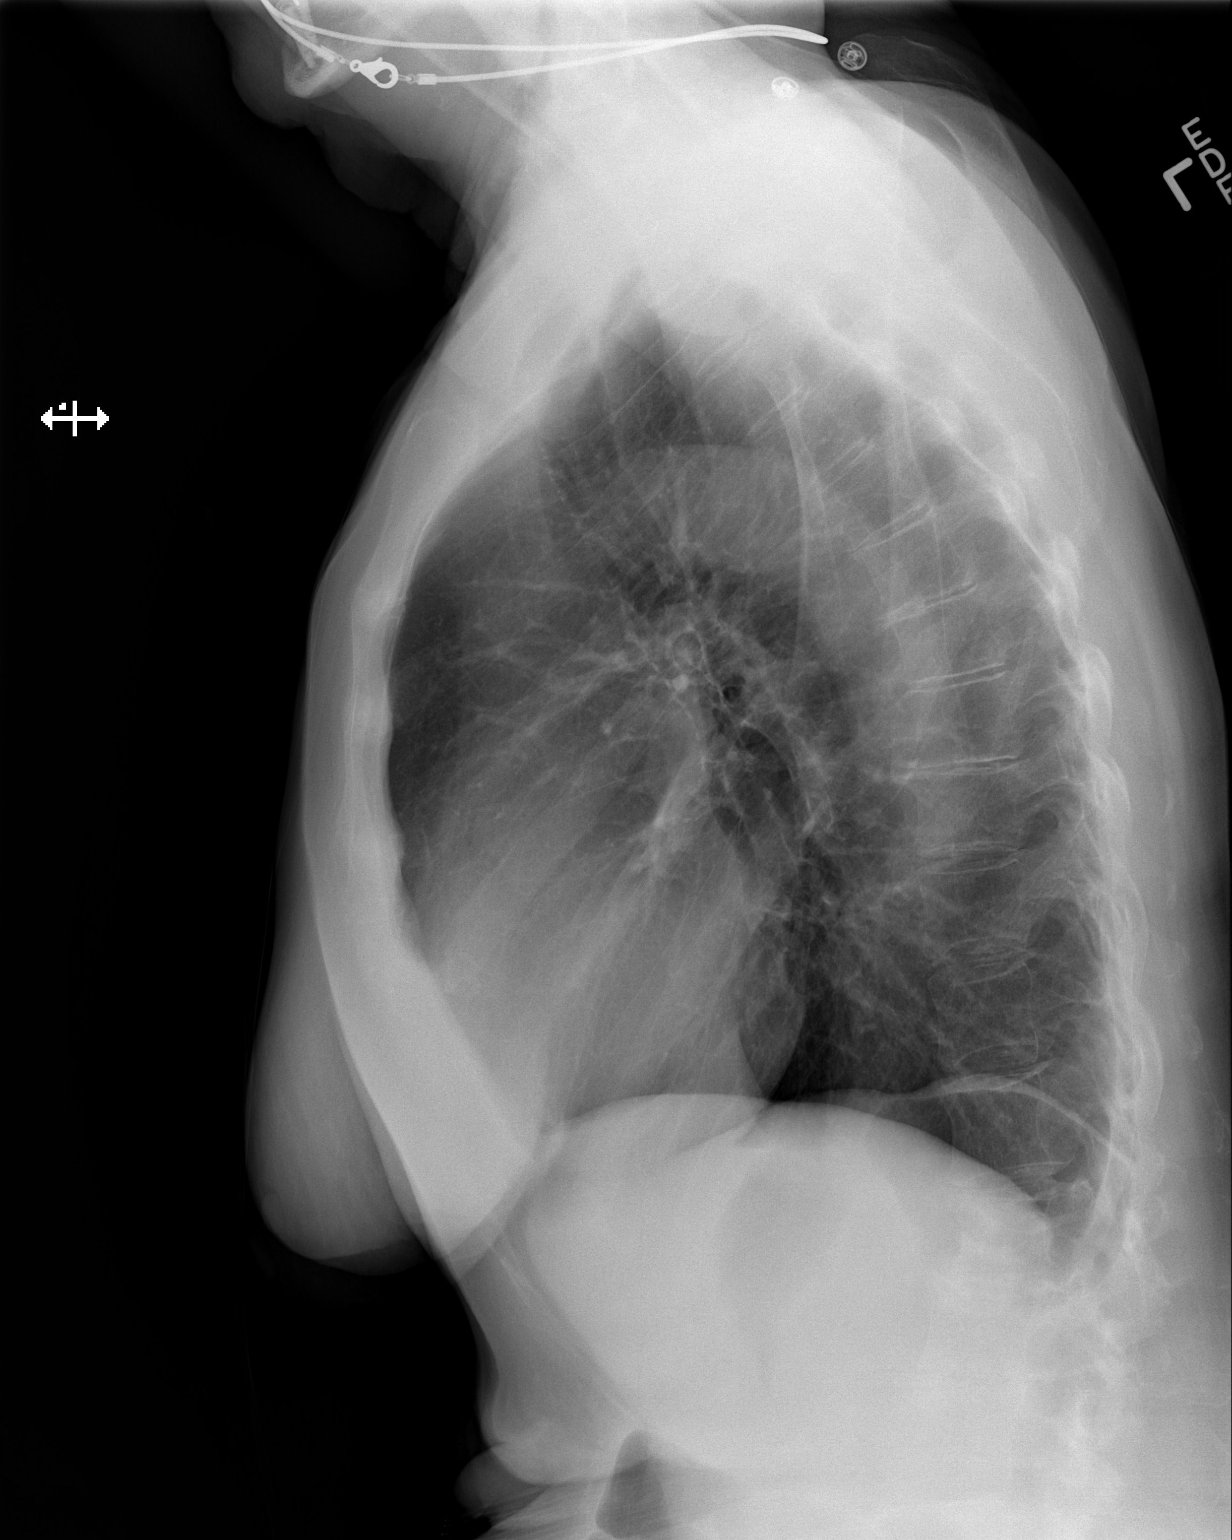

[2 of 2 positions shown; findings below may reference images not displayed]

FINDINGS: Cardiomediastinal silhouette is stable.  Hyperinflation
again noted.  Again noted lower thoracic and upper lumbar
levoscoliosis.  Osteopenia and mild degenerative changes mid
thoracic spine.  No acute infiltrate or pulmonary edema.
IMPRESSION: No active disease.  No significant change.  Hyperinflation again
noted.

## 2015-04-22 ENCOUNTER — Ambulatory Visit (INDEPENDENT_AMBULATORY_CARE_PROVIDER_SITE_OTHER): Payer: Medicare HMO | Admitting: Gynecology

## 2015-04-22 ENCOUNTER — Encounter: Payer: Self-pay | Admitting: Gynecology

## 2015-04-22 VITALS — BP 114/66

## 2015-04-22 DIAGNOSIS — N8111 Cystocele, midline: Secondary | ICD-10-CM

## 2015-04-22 DIAGNOSIS — N3000 Acute cystitis without hematuria: Secondary | ICD-10-CM

## 2015-04-22 DIAGNOSIS — N814 Uterovaginal prolapse, unspecified: Secondary | ICD-10-CM | POA: Diagnosis not present

## 2015-04-22 LAB — URINALYSIS W MICROSCOPIC + REFLEX CULTURE
Bilirubin Urine: NEGATIVE
CRYSTALS: NONE SEEN
Casts: NONE SEEN
GLUCOSE, UA: NEGATIVE mg/dL
Nitrite: POSITIVE — AB
PROTEIN: 30 mg/dL — AB
Specific Gravity, Urine: 1.015 (ref 1.005–1.030)
Urobilinogen, UA: 0.2 mg/dL (ref 0.0–1.0)
pH: 6.5 (ref 5.0–8.0)

## 2015-04-22 MED ORDER — CIPROFLOXACIN 250 MG/5ML (5%) PO SUSR: 250.0000 mg | Freq: Two times a day (BID) | ORAL | Status: DC

## 2015-04-22 NOTE — Patient Instructions (Signed)
Take the oral antibiotic twice daily for 7 days.  Office will call you when the pessary becomes available.

## 2015-04-22 NOTE — Progress Notes (Signed)
[name] 01-19-1946 [id number]        69 y.o.  [id number] Presents complaining of some pelvic discomfort. Has been treated on and off for the last several months for recurrent UTI.  She has been treated with a penicillin base, levofloxacin, Macrodantin from her chart review. Office records are not available at this time. Has been seen in the past for cystocele/uterine prolapse. Once to talk about whether this is contributing to her recurrent UTIs. Currently having incontinence which is a chronic problem for her. No acute changes such as fever or chills frequency dysuria.  Past medical history,surgical history, problem list, medications, allergies, family history and social history were all reviewed and documented in the EPIC chart.  Directed ROS with pertinent positives and negatives documented in the history of present illness/assessment and plan.  Exam: [name] assistant Filed Vitals:   04/22/15 1526  BP: 114/66   General appearance:  Normal Abdomen soft nontender without masses guarding rebound Pelvic external BUS vagina with atrophic changes. Second-degree cystocele. Second degree uterine prolapse. Mild rectocele. Cervix with atrophic changes. Uterus grossly normal midline mobile. Adnexa without masses or tenderness.  Procedure: Patient was fitted with a Gellhorn 2 3/4 pessary. She ambulated and did well without discomfort.  Assessment/Plan:  69 y.o. [id number] with recurrent UTIs, cystocele and uterine prolapse. UA today suggest UTI with many bacteria TNTC WBC. She does have many squamous cells also ago. Will follow up with culture and cover her with ciprofloxacin 250 mg twice a day 7 days. She is asking for the solution that she has difficulty swallowing pills.  Options for management of her incontinence/cystocele/uterine prolapse was discussed. Observation, pessary and surgery reviewed. Patient is not interested in pursuing surgery at this time and I agree given her medical history she would  be certainly at an increased risk for surgery. I would have her see the urologist preoperatively if she would ever consider that. For now she is wanting to try the pessary. She was successfully fitted with a Gellhorn 2 3/4 pessary. She will follow up when her ordered pessary arrives.    [name] MD, 4:02 PM 04/22/2015

## 2015-04-22 NOTE — Addendum Note (Signed)
Addended by: Nelva Nay on: 04/22/2015 04:24 PM   Modules accepted: Orders

## 2015-04-23 ENCOUNTER — Telehealth: Payer: Self-pay | Admitting: *Deleted

## 2015-04-23 NOTE — Telephone Encounter (Signed)
Prior authorization for Cipro approved with case # [id number] until 12/18/2015

## 2015-04-26 LAB — URINE CULTURE: Colony Count: >=100000

## 2015-05-03 ENCOUNTER — Telehealth: Payer: Self-pay | Admitting: Gynecology

## 2015-05-03 DIAGNOSIS — N3000 Acute cystitis without hematuria: Secondary | ICD-10-CM

## 2015-05-03 NOTE — Telephone Encounter (Signed)
Left message for pt to call.

## 2015-05-03 NOTE — Telephone Encounter (Signed)
Pt informed with the below, will be in on Thursday 05/06/15 will have done then

## 2015-05-03 NOTE — Telephone Encounter (Signed)
Urine sensitivities returned that the bacteria was resistant to ciprofloxacin which she was treated with. Recommend that she repeat a clean catch urine culture to see if the antibiotic got rid of the bacteria. If not then we will need to change antibiotics.

## 2015-05-06 ENCOUNTER — Ambulatory Visit (INDEPENDENT_AMBULATORY_CARE_PROVIDER_SITE_OTHER): Payer: Medicare HMO | Admitting: Gynecology

## 2015-05-06 ENCOUNTER — Encounter: Payer: Self-pay | Admitting: Gynecology

## 2015-05-06 VITALS — BP 118/74

## 2015-05-06 DIAGNOSIS — N8111 Cystocele, midline: Secondary | ICD-10-CM

## 2015-05-06 DIAGNOSIS — N816 Rectocele: Secondary | ICD-10-CM

## 2015-05-06 DIAGNOSIS — N814 Uterovaginal prolapse, unspecified: Secondary | ICD-10-CM

## 2015-05-06 DIAGNOSIS — B3749 Other urogenital candidiasis: Secondary | ICD-10-CM

## 2015-05-06 LAB — URINALYSIS W MICROSCOPIC + REFLEX CULTURE
Bilirubin Urine: NEGATIVE
CASTS: NONE SEEN
Crystals: NONE SEEN
Glucose, UA: NEGATIVE mg/dL
NITRITE: POSITIVE — AB
Specific Gravity, Urine: 1.01 (ref 1.005–1.030)
UROBILINOGEN UA: 0.2 mg/dL (ref 0.0–1.0)
pH: 6 (ref 5.0–8.0)

## 2015-05-06 MED ORDER — AMOXICILLIN-POT CLAVULANATE 400-57 MG/5ML PO SUSR
400.0000 mg | Freq: Two times a day (BID) | ORAL | Status: DC
Start: 2015-05-06 — End: 2015-06-24

## 2015-05-06 NOTE — Progress Notes (Signed)
[name] July 18, 1946 [id number]        69 y.o.  [id number] presents for placement of her Gellhorn 2-3/4 pessary for her cystocele/rectocele/uterine prolapse. Of note her UTI previously was true with ciprofloxacin which ultimately was resistant to this. It is sensitive to nitrofurantoin and Augmentin. Patient is unable to swallow pills.  Past medical history,surgical history, problem list, medications, allergies, family history and social history were all reviewed and documented in the EPIC chart.  Directed ROS with pertinent positives and negatives documented in the history of present illness/assessment and plan.  Exam: [name] assistant Filed Vitals:   05/06/15 1429  BP: 118/74   General appearance:  Normal External BUS vagina with atrophic changes. Second-degree cystocele first-degree rectocele mild uterine prolapse noted. Bimanual without masses or tenderness. Gelhorn pessary was placed without difficulty. Patient ambulated with good support and no discomfort.  Assessment/Plan:  69 y.o. [id number] with pelvic prolapse as above. Pessary placed successfully. Precautions as far as urinary or bowel symptoms discussed. Follow up in one month for recheck. She's actually due for annual will follow up for this. We'll go ahead and treat her UTI with Augmentin 400/57 twice a day 7 days. Will use a little lower dose given her age and methotrexate use. Will recheck her urine next month when she returns.    [name] MD, 2:57 PM 05/06/2015

## 2015-05-06 NOTE — Patient Instructions (Signed)
Take the antibiotic twice daily as prescribed. Follow up in one month for your annual exam and pessary reexamination.

## 2015-05-06 NOTE — Addendum Note (Signed)
Addended by: Nelva Nay on: 05/06/2015 03:43 PM   Modules accepted: Orders

## 2015-05-07 ENCOUNTER — Encounter: Payer: Self-pay | Admitting: Gynecology

## 2015-05-07 ENCOUNTER — Ambulatory Visit (INDEPENDENT_AMBULATORY_CARE_PROVIDER_SITE_OTHER): Payer: Medicare HMO | Admitting: Gynecology

## 2015-05-07 ENCOUNTER — Telehealth: Payer: Self-pay | Admitting: *Deleted

## 2015-05-07 VITALS — BP 120/72

## 2015-05-07 DIAGNOSIS — N8111 Cystocele, midline: Secondary | ICD-10-CM | POA: Diagnosis not present

## 2015-05-07 NOTE — Patient Instructions (Signed)
Office will call you when pessary is available. 

## 2015-05-07 NOTE — Progress Notes (Signed)
[name] June 19, 1946 [id number]        69 y.o.  [id number] Patient returns having had a Gellhorn pessary placed yesterday. Notes that it feels like it's coming when she was using the toilet. No oral pain or difficulty voiding  Past medical history,surgical history, problem list, medications, allergies, family history and social history were all reviewed and documented in the EPIC chart.  Directed ROS with pertinent positives and negatives documented in the history of present illness/assessment and plan.  Exam: [name] assistant Filed Vitals:   05/07/15 1056  BP: 120/72   General appearance:  Normal External BUS vagina with atrophic changes. Gelhorn pessary appears in place. When she strains the point of the pessary slightly comes out of the vaginal opening. Pessary was removed bimanual without masses or tenderness. New ring pessary with support #5 was placed. Patient ambulated and strained and said this feels better in place.  Assessment/Plan:  69 y.o. [id number] with cystocele rectocele uterine prolapse. New pessary fitted. Patient will return for placement when new pessary arrives.    [name] MD, 11:13 AM 05/07/2015

## 2015-05-07 NOTE — Telephone Encounter (Signed)
Pt called had pessary placed on 05/06/15 has fell half way out, advised OV pt is on her way now to be seen.

## 2015-05-08 LAB — URINE CULTURE: Colony Count: >=100000

## 2015-05-20 ENCOUNTER — Other Ambulatory Visit: Payer: Self-pay

## 2015-05-20 DIAGNOSIS — Z1231 Encounter for screening mammogram for malignant neoplasm of breast: Secondary | ICD-10-CM

## 2015-05-27 ENCOUNTER — Ambulatory Visit (INDEPENDENT_AMBULATORY_CARE_PROVIDER_SITE_OTHER): Payer: Medicare HMO | Admitting: Gynecology

## 2015-05-27 ENCOUNTER — Encounter: Payer: Self-pay | Admitting: Gynecology

## 2015-05-27 VITALS — BP 118/76

## 2015-05-27 DIAGNOSIS — R300 Dysuria: Secondary | ICD-10-CM | POA: Diagnosis not present

## 2015-05-27 DIAGNOSIS — N8111 Cystocele, midline: Secondary | ICD-10-CM

## 2015-05-27 LAB — URINALYSIS W MICROSCOPIC + REFLEX CULTURE
Bilirubin Urine: NEGATIVE
CASTS: NONE SEEN
Crystals: NONE SEEN
Glucose, UA: NEGATIVE mg/dL
Hgb urine dipstick: NEGATIVE
KETONES UR: NEGATIVE mg/dL
Nitrite: NEGATIVE
Protein, ur: NEGATIVE mg/dL
Specific Gravity, Urine: 1.015 (ref 1.005–1.030)
Urobilinogen, UA: 0.2 mg/dL (ref 0.0–1.0)
pH: 7 (ref 5.0–8.0)

## 2015-05-27 NOTE — Patient Instructions (Signed)
Follow-up in 2 weeks for reexamination. 

## 2015-05-27 NOTE — Addendum Note (Signed)
Addended by: Nelva Nay on: 05/27/2015 11:38 AM   Modules accepted: Orders

## 2015-05-27 NOTE — Progress Notes (Signed)
TANINA BARB 1946/04/10 [id number]        69 y.o.  [id number] Presents for pessary placement.  Past medical history,surgical history, problem list, medications, allergies, family history and social history were all reviewed and documented in the EPIC chart.  Directed ROS with pertinent positives and negatives documented in the history of present illness/assessment and plan.  Exam: [name] assistant Filed Vitals:   05/27/15 1039  BP: 118/76   General appearance:  Normal Pelvic external BUS vagina with protruding cystocele. Ring pessary with support #5 (three-inch) placed without difficulty. Patient ambulated, does feel some pressure but no pain.  Assessment/Plan:  69 y.o. G2R4270 with placement of ring pessary. Feeling some pressure but no pain. Will monitor over the next several days to see if this doesn't resolve. Follow up in 2 weeks regardless for recheck. Sooner if any issues.    [name] MD, 10:54 AM 05/27/2015

## 2015-05-29 LAB — URINE CULTURE

## 2015-06-11 ENCOUNTER — Ambulatory Visit (INDEPENDENT_AMBULATORY_CARE_PROVIDER_SITE_OTHER): Payer: Medicare HMO | Admitting: Gynecology

## 2015-06-11 ENCOUNTER — Encounter: Payer: Self-pay | Admitting: Gynecology

## 2015-06-11 VITALS — BP 124/76

## 2015-06-11 DIAGNOSIS — N814 Uterovaginal prolapse, unspecified: Secondary | ICD-10-CM

## 2015-06-11 DIAGNOSIS — N8111 Cystocele, midline: Secondary | ICD-10-CM

## 2015-06-11 NOTE — Patient Instructions (Signed)
Follow up for your annual exam as scheduled. 

## 2015-06-11 NOTE — Progress Notes (Signed)
[name] 1946-01-18 [id number]        69 y.o.  [id number] Presents for pessary recheck. Had a new larger pessary placed 2 weeks ago. Notes she is doing well with this. Was initially feeling pressure but no longer feeling discomfort.  Past medical history,surgical history, problem list, medications, allergies, family history and social history were all reviewed and documented in the EPIC chart.  Directed ROS with pertinent positives and negatives documented in the history of present illness/assessment and plan.  Exam: [name] assistant Filed Vitals:   06/11/15 1606  BP: 124/76   General appearance:  Normal Pelvic external BUS vagina with atrophic changes. Cervix grossly normal. Cystocele/uterine prolapse noted. Uterus grossly normal size midline mobile nontender. Adnexa without masses or tenderness.  Ring pessary was removed cleansed and replaced without difficulty. Vaginal mucosa without irritation or erosion.  Assessment/Plan:  69 y.o. [id number]  Withcystocele/uterine prolapse with good response to pessary. Patient actually has an annual exam scheduled in one and a half weeks will follow up with [place]. Otherwise she'll follow up in 2 months assuming she continues well with her pessary.    [name] MD, 4:21 PM 06/11/2015

## 2015-06-17 ENCOUNTER — Ambulatory Visit
Admission: RE | Admit: 2015-06-17 | Discharge: 2015-06-17 | Disposition: A | Discharge status: Home / Self Care | Payer: Medicare HMO | Source: Ambulatory Visit

## 2015-06-17 DIAGNOSIS — Z1231 Encounter for screening mammogram for malignant neoplasm of breast: Secondary | ICD-10-CM

## 2015-06-24 ENCOUNTER — Encounter: Payer: Self-pay | Admitting: Gynecology

## 2015-06-24 ENCOUNTER — Ambulatory Visit (INDEPENDENT_AMBULATORY_CARE_PROVIDER_SITE_OTHER): Payer: Medicare HMO | Admitting: Gynecology

## 2015-06-24 VITALS — BP 110/60 | Ht 59.0 in | Wt 116.0 lb

## 2015-06-24 DIAGNOSIS — N814 Uterovaginal prolapse, unspecified: Secondary | ICD-10-CM

## 2015-06-24 DIAGNOSIS — M810 Age-related osteoporosis without current pathological fracture: Secondary | ICD-10-CM

## 2015-06-24 DIAGNOSIS — N952 Postmenopausal atrophic vaginitis: Secondary | ICD-10-CM

## 2015-06-24 DIAGNOSIS — Z01419 Encounter for gynecological examination (general) (routine) without abnormal findings: Secondary | ICD-10-CM | POA: Diagnosis not present

## 2015-06-24 DIAGNOSIS — N8111 Cystocele, midline: Secondary | ICD-10-CM | POA: Diagnosis not present

## 2015-06-24 NOTE — Progress Notes (Signed)
[name] Sep 18, 1946 [id number]        69 y.o.  [id number] for breast and pelvic exam.  Past medical history,surgical history, problem list, medications, allergies, family history and social history were all reviewed and documented as reviewed in the EPIC chart.  ROS:  Performed with pertinent positives and negatives included in the history, assessment and plan.   Additional significant findings :  none   Exam: [name] Vitals:   06/24/15 1125  BP: 110/60  Height: 4\' 11"  (1.499 m)  Weight: 116 lb (52.617 kg)   General appearance:  Normal affect, orientation and appearance. Skin: Grossly normal HEENT: Without gross lesions.  No cervical or supraclavicular adenopathy. Thyroid normal.  Lungs:  Clear without wheezing, rales or rhonchi Cardiac: RR, without RMG Abdominal:  Soft, nontender, without masses, guarding, rebound, organomegaly or hernia Breasts:  Examined lying and sitting without masses, retractions, discharge or axillary adenopathy. Pelvic:  Ext/BUS/vagina with second-degree cystocele and second degree uterine prolapse. Her ring pessary with support was removed cleaned and replaced without difficulty.  No evidence of irritation/erosions.  Cervix with atrophic changes  Uterus axial, normal size, shape and contour, midline and mobile nontender   Adnexa  Without masses or tenderness    Anus and perineum  Normal   Rectovaginal  Normal sphincter tone without palpated masses or tenderness.    Assessment/Plan:  69 y.o. [id number] female for breast and pelvic exam.   1. Cystocele/uterine prolapse. Doing well with her pessary. Will follow up in 2 months for pessary recheck. 2. Mammography 05/2015. Continue with annual mammography. SBE monthly reviewed. 3. Osteoporosis. Has bone density scheduled next week at Dr. Pennie Banter office.  We'll continue to follow up with him for evaluation and treatment. 4. Colonoscopy 2013. Repeat at their recommended interval. 5. Pap smear  2015. No Pap smear done today. No history of abnormal Pap smears. Options to stop screening altogether or less frequent screening intervals reviewed. Will readdress on an annual basis. 6. Health maintenance. No routine lab work done as this is done at Dr. Pennie Banter office. Follow up in 2 months for pessary recheck.   Sabrina Auerbach MD, 11:48 AM 06/24/2015

## 2015-06-24 NOTE — Patient Instructions (Signed)
Follow up in 2 months for pessary reexamination.

## 2015-08-26 ENCOUNTER — Ambulatory Visit (INDEPENDENT_AMBULATORY_CARE_PROVIDER_SITE_OTHER): Payer: Medicare HMO | Admitting: Gynecology

## 2015-08-26 ENCOUNTER — Encounter: Payer: Self-pay | Admitting: Gynecology

## 2015-08-26 VITALS — BP 122/80 | Ht 61.0 in

## 2015-08-26 DIAGNOSIS — N814 Uterovaginal prolapse, unspecified: Secondary | ICD-10-CM | POA: Diagnosis not present

## 2015-08-26 DIAGNOSIS — N8111 Cystocele, midline: Secondary | ICD-10-CM | POA: Diagnosis not present

## 2015-08-26 NOTE — Progress Notes (Signed)
[name] 05-Dec-1946 [id number]        69 y.o.  [id number] Presents for pessary recheck. Had ring pessary placed for her cystocele/uterine prolapse in June. Has done well without complaints and no bleeding. No discomfort or urinary/rectal issues.  Past medical history,surgical history, problem list, medications, allergies, family history and social history were all reviewed and documented in the EPIC chart.  Directed ROS with pertinent positives and negatives documented in the history of present illness/assessment and plan.  Exam: Wandra Scot assistant Filed Vitals:   08/26/15 1135  BP: 122/80  Height: 5\' 1"  (1.549 m)   General appearance:  Normal Abdomen soft nontender without masses guarding rebound Pelvic external BUS vagina with atrophic changes. Pessary removed. Vagina without evidence of erosion or abrasions. Cervix atrophic. Uterus small, midline mobile. Adnexa without masses.  Ring pessary was removed cleansed and replaced without difficulty.  Assessment/Plan:  69 y.o. [id number] with good response to a ring pessary for her uterine prolapse and cystocele. Follow up in 4 months for pessary reexamination. Sooner if any issues.    [name] MD, 12:05 PM 08/26/2015

## 2015-08-26 NOTE — Patient Instructions (Signed)
Follow up in 4 months for pessary reexamination. Sooner if any issues.

## 2015-09-23 DIAGNOSIS — M3501 Sicca syndrome with keratoconjunctivitis: Secondary | ICD-10-CM | POA: Diagnosis not present

## 2015-09-23 DIAGNOSIS — M150 Primary generalized (osteo)arthritis: Secondary | ICD-10-CM | POA: Diagnosis not present

## 2015-09-23 DIAGNOSIS — M3322 Polymyositis with myopathy: Secondary | ICD-10-CM | POA: Diagnosis not present

## 2015-09-23 DIAGNOSIS — M810 Age-related osteoporosis without current pathological fracture: Secondary | ICD-10-CM | POA: Diagnosis not present

## 2015-09-24 DIAGNOSIS — I10 Essential (primary) hypertension: Secondary | ICD-10-CM | POA: Diagnosis not present

## 2015-09-24 DIAGNOSIS — Z23 Encounter for immunization: Secondary | ICD-10-CM | POA: Diagnosis not present

## 2015-10-07 DIAGNOSIS — I10 Essential (primary) hypertension: Secondary | ICD-10-CM | POA: Diagnosis not present

## 2015-10-08 DIAGNOSIS — K120 Recurrent oral aphthae: Secondary | ICD-10-CM | POA: Diagnosis not present

## 2015-10-19 DIAGNOSIS — I10 Essential (primary) hypertension: Secondary | ICD-10-CM | POA: Diagnosis not present

## 2015-10-19 DIAGNOSIS — K120 Recurrent oral aphthae: Secondary | ICD-10-CM | POA: Diagnosis not present

## 2015-10-27 ENCOUNTER — Encounter (HOSPITAL_COMMUNITY): Payer: Self-pay

## 2015-10-27 ENCOUNTER — Emergency Department (HOSPITAL_COMMUNITY): Payer: Medicare HMO

## 2015-10-27 ENCOUNTER — Emergency Department (HOSPITAL_COMMUNITY)
Admission: EM | Admit: 2015-10-27 | Discharge: 2015-10-27 | Disposition: A | Discharge status: Home / Self Care | Payer: Medicare HMO | Attending: Emergency Medicine | Admitting: Emergency Medicine

## 2015-10-27 DIAGNOSIS — G8929 Other chronic pain: Secondary | ICD-10-CM | POA: Insufficient documentation

## 2015-10-27 DIAGNOSIS — R5383 Other fatigue: Secondary | ICD-10-CM | POA: Insufficient documentation

## 2015-10-27 DIAGNOSIS — R1011 Right upper quadrant pain: Secondary | ICD-10-CM

## 2015-10-27 DIAGNOSIS — Z79899 Other long term (current) drug therapy: Secondary | ICD-10-CM | POA: Insufficient documentation

## 2015-10-27 DIAGNOSIS — R109 Unspecified abdominal pain: Secondary | ICD-10-CM | POA: Diagnosis not present

## 2015-10-27 DIAGNOSIS — Z7982 Long term (current) use of aspirin: Secondary | ICD-10-CM | POA: Insufficient documentation

## 2015-10-27 DIAGNOSIS — R531 Weakness: Secondary | ICD-10-CM | POA: Diagnosis not present

## 2015-10-27 DIAGNOSIS — R112 Nausea with vomiting, unspecified: Secondary | ICD-10-CM | POA: Diagnosis not present

## 2015-10-27 DIAGNOSIS — Z8639 Personal history of other endocrine, nutritional and metabolic disease: Secondary | ICD-10-CM | POA: Insufficient documentation

## 2015-10-27 DIAGNOSIS — Z792 Long term (current) use of antibiotics: Secondary | ICD-10-CM | POA: Insufficient documentation

## 2015-10-27 DIAGNOSIS — K5900 Constipation, unspecified: Secondary | ICD-10-CM | POA: Insufficient documentation

## 2015-10-27 DIAGNOSIS — K2970 Gastritis, unspecified, without bleeding: Secondary | ICD-10-CM | POA: Diagnosis not present

## 2015-10-27 DIAGNOSIS — D509 Iron deficiency anemia, unspecified: Secondary | ICD-10-CM | POA: Insufficient documentation

## 2015-10-27 DIAGNOSIS — I10 Essential (primary) hypertension: Secondary | ICD-10-CM | POA: Diagnosis not present

## 2015-10-27 DIAGNOSIS — H409 Unspecified glaucoma: Secondary | ICD-10-CM | POA: Diagnosis not present

## 2015-10-27 DIAGNOSIS — M1990 Unspecified osteoarthritis, unspecified site: Secondary | ICD-10-CM | POA: Insufficient documentation

## 2015-10-27 LAB — COMPREHENSIVE METABOLIC PANEL
ALBUMIN: 3.3 g/dL — AB (ref 3.5–5.0)
ALK PHOS: 154 U/L — AB (ref 38–126)
ALT: 17 U/L (ref 14–54)
AST: 55 U/L — AB (ref 15–41)
Anion gap: 7 (ref 5–15)
BILIRUBIN TOTAL: 0.8 mg/dL (ref 0.3–1.2)
BUN: 12 mg/dL (ref 6–20)
CALCIUM: 8.5 mg/dL — AB (ref 8.9–10.3)
CO2: 23 mmol/L (ref 22–32)
CREATININE: 0.52 mg/dL (ref 0.44–1.00)
Chloride: 99 mmol/L — ABNORMAL LOW (ref 101–111)
GFR calc Af Amer: >60 mL/min (ref >60)
GFR calc non Af Amer: >60 mL/min (ref >60)
GLUCOSE: 97 mg/dL (ref 65–99)
Potassium: 4 mmol/L (ref 3.5–5.1)
Sodium: 129 mmol/L — ABNORMAL LOW (ref 135–145)
TOTAL PROTEIN: 6 g/dL — AB (ref 6.5–8.1)

## 2015-10-27 LAB — CBC WITH DIFFERENTIAL/PLATELET
BASOS ABS: 0 10*3/uL (ref 0.0–0.1)
BASOS PCT: 0 %
EOS PCT: 3 %
Eosinophils Absolute: 0.2 10*3/uL (ref 0.0–0.7)
HEMATOCRIT: 27 % — AB (ref 36.0–46.0)
Hemoglobin: 9.5 g/dL — ABNORMAL LOW (ref 12.0–15.0)
LYMPHS PCT: 9 %
Lymphs Abs: 0.5 10*3/uL — ABNORMAL LOW (ref 0.7–4.0)
MCH: 32.6 pg (ref 26.0–34.0)
MCHC: 35.2 g/dL (ref 30.0–36.0)
MCV: 92.8 fL (ref 78.0–100.0)
MONO ABS: 0.7 10*3/uL (ref 0.1–1.0)
MONOS PCT: 13 %
NEUTROS ABS: 3.7 10*3/uL (ref 1.7–7.7)
Neutrophils Relative %: 75 %
Platelets: 256 10*3/uL (ref 150–400)
RBC: 2.91 MIL/uL — ABNORMAL LOW (ref 3.87–5.11)
RDW: 15.1 % (ref 11.5–15.5)
WBC: 5 10*3/uL (ref 4.0–10.5)

## 2015-10-27 LAB — I-STAT TROPONIN, ED: TROPONIN I, POC: 0.02 ng/mL (ref 0.00–0.08)

## 2015-10-27 LAB — URINALYSIS, ROUTINE W REFLEX MICROSCOPIC
Bilirubin Urine: NEGATIVE
GLUCOSE, UA: NEGATIVE mg/dL
Ketones, ur: NEGATIVE mg/dL
Nitrite: NEGATIVE
Protein, ur: NEGATIVE mg/dL
SPECIFIC GRAVITY, URINE: 1.012 (ref 1.005–1.030)
Urobilinogen, UA: 0.2 mg/dL (ref 0.0–1.0)
pH: 7 (ref 5.0–8.0)

## 2015-10-27 LAB — LIPASE, BLOOD: Lipase: 39 U/L (ref 11–51)

## 2015-10-27 LAB — URINE MICROSCOPIC-ADD ON

## 2015-10-27 LAB — I-STAT CG4 LACTIC ACID, ED: Lactic Acid, Venous: 1 mmol/L (ref 0.5–2.0)

## 2015-10-27 MED ORDER — SODIUM CHLORIDE 0.9 % IV SOLN
1000.0000 mL | Freq: Once | INTRAVENOUS | Status: AC
  Administered 2015-10-27: 1000 mL via INTRAVENOUS

## 2015-10-27 MED ORDER — MORPHINE SULFATE (PF) 4 MG/ML IV SOLN
4.0000 mg | Freq: Once | INTRAVENOUS | Status: AC
  Administered 2015-10-27: 4 mg via INTRAVENOUS
  Filled 2015-10-27: qty 1

## 2015-10-27 MED ORDER — ONDANSETRON HCL 4 MG/2ML IJ SOLN
4.0000 mg | Freq: Once | INTRAMUSCULAR | Status: AC
  Administered 2015-10-27: 4 mg via INTRAVENOUS
  Filled 2015-10-27: qty 2

## 2015-10-27 NOTE — ED Notes (Signed)
Pt presents from home via EMS with c/o abdominal pain that started this morning after eating some oatmeal. Pt feels like this is a flare-up of her pancreatitis. No ETOH intake. Pt reports her last flare-up was several years ago. Nausea and vomiting present. 20 g right forearm started by EMS, 4mg  zofran given en route by EMS.

## 2015-10-27 NOTE — Discharge Instructions (Signed)
Pain Without a Known Cause °WHAT IS PAIN WITHOUT A KNOWN CAUSE? °Pain can occur in any part of the body and can range from mild to severe. Sometimes no cause can be found for why you are having pain. Some types of pain that can occur without a known cause include:  °· Headache. °· Back pain. °· Abdominal pain. °· Neck pain. °HOW IS PAIN WITHOUT A KNOWN CAUSE DIAGNOSED?  °Your health care provider will try to find the cause of your pain. This may include: °· Physical exam. °· Medical history. °· Blood tests. °· Urine tests. °· X-rays. °If no cause is found, your health care provider may diagnose you with pain without a known cause.  °IS THERE TREATMENT FOR PAIN WITHOUT A CAUSE?  °Treatment depends on the kind of pain you have. Your health care provider may prescribe medicines to help relieve your pain.  °WHAT CAN I DO AT HOME FOR MY PAIN?  °· Take medicines only as directed by your health care provider. °· Stop any activities that cause pain. During periods of severe pain, bed rest may help. °· Try to reduce your stress with activities such as yoga or meditation. Talk to your health care provider for other stress-reducing activity recommendations. °· Exercise regularly, if approved by your health care provider. °· Eat a healthy diet that includes fruits and vegetables. This may improve pain. Talk to your health care provider if you have any questions about your diet. °WHAT IF MY PAIN DOES NOT GET BETTER?  °If you have a painful condition and no reason can be found for the pain or the pain gets worse, it is important to follow up with your health care provider. It may be necessary to repeat tests and look further for a possible cause.  °  °This information is not intended to replace advice given to you by your health care provider. Make sure you discuss any questions you have with your health care provider. °  °Document Released: 08/29/2001 Document Revised: 12/25/2014 Document Reviewed: 04/21/2014 °[employer]vier  Interactive Patient Education ©2016 [employer] ° °

## 2015-10-27 NOTE — ED Notes (Signed)
Bed: VQ94 Expected date:  Expected time:  Means of arrival:  Comments: EMS- 69yo F, abdominal pain/Hx of pancreatitis

## 2015-10-27 NOTE — ED Provider Notes (Signed)
CSN: [id number]     Arrival date & time 10/27/15  2993 History   First MD Initiated Contact with Patient 10/27/15 [phone]     Chief Complaint  Patient presents with  . Abdominal Pain     (Consider location/radiation/quality/duration/timing/severity/associated sxs/prior Treatment) Patient is a 69 y.o. female presenting with abdominal pain. The history is provided by the patient.  Abdominal Pain Pain location:  RUQ Pain quality: sharp and shooting   Pain radiates to:  Does not radiate Pain severity:  Severe Onset quality:  Sudden Duration:  3 hours Timing:  Constant Progression:  Unchanged Chronicity:  Recurrent Relieved by:  Nothing Worsened by:  Nothing tried Ineffective treatments:  None tried Associated symptoms: constipation, fatigue, nausea and vomiting   Associated symptoms: no chest pain, no chills, no diarrhea, no dysuria, no fever and no shortness of breath   Risk factors: being elderly    69 yo F with a chief complaint of right upper quadrant abdominal pain. She said this started medially after eating her oatmeal. Sharp and shooting pain radiates up into bilateral shoulders. Denies radiation to the back. Denies fevers or chills. Had a few episodes of vomiting continued nausea. Patient states she had a similar presentation a couple years ago and was diagnosed with acute pancreatitis. This has no known etiology. Old records reviewed patient had an MRCP done in 2014 that was negative. Old GI note concern for possible gallstone pancreatitis as the etiology.  Past Medical History  Diagnosis Date  . HTN (hypertension)   . Glaucoma   . Sjogren's syndrome ([place])   . Raynaud phenomenon   . Polymyositis ([place])   . GERD (gastroesophageal reflux disease)   . Swallowing difficulty     unable to swallow whole pills  . AAA (abdominal aortic aneurysm) ([place])     small      report in epic 11/11 states no aaa    . Iron deficiency anemia     hx  . Unspecified deficiency anemia 03/14/2013   . Family history of anesthesia complication     "sister has severe sleep apnea; she has alot of trouble w/anesthesia" (09/08/2013)  . High cholesterol     "not severe; can't take the medicine" (09/08/2013)  . Polymyositis ([place])   . Multiple thyroid nodules   . Migraines     "hurt like a migraine; don't get them like I did when I was younger" (09/08/2013)  . OA (osteoarthritis)   . Chronic lower back pain     "for 6 years; relieved since back OR 07/2013" (09/08/2013)  . Swelling of left lower extremity   . Pancreatitis    Past Surgical History  Procedure Laterality Date  . Cataract extraction w/ intraocular lens  implant, bilateral Bilateral 12/2010-01/2011  . Esophageal dilation      "a few times before 02/2010; not since" (09/08/2013)  . Repair zenker's diverticula  02/2010    "diverticulectomy w/zenker's pouch correction" (09/08/2013)  . Dental surgery  11/2011-12/2011    one on each side in my bottom jaw; dental implants" (09/08/2013)  . Lumbar fusion  Aug 14, 2013    Dr. Consuello Masse   Family History  Problem Relation Age of Onset  . Cancer Mother     ovarian  . Ovarian cancer Mother   . Cancer Father     colon and breast  . Hypertension Father   . Heart disease Father   . Breast cancer Father     Age 7  . Colon cancer Father   .  Psoriasis Sister   . Sarcoidosis Sister   . Arthritis Sister     psoriatic arthritis  . Diabetes Sister   . Arthritis Maternal Uncle     rheumatiod arthritis  . Diabetes Son   . Heart disease Brother    Social History  Substance Use Topics  . Smoking status: Never Smoker   . Smokeless tobacco: Never Used  . Alcohol Use: No   OB History    Gravida Para Term Preterm AB TAB SAB Ectopic Multiple Living   4 4 4       4      Review of Systems  Constitutional: Positive for fatigue. Negative for fever and chills.  HENT: Negative for congestion and rhinorrhea.   Eyes: Negative for redness and visual disturbance.  Respiratory: Negative for shortness of  breath and wheezing.   Cardiovascular: Negative for chest pain and palpitations.  Gastrointestinal: Positive for nausea, vomiting, abdominal pain and constipation. Negative for diarrhea.  Genitourinary: Negative for dysuria and urgency.  Musculoskeletal: Negative for myalgias and arthralgias.  Skin: Negative for pallor and wound.  Neurological: Negative for dizziness and headaches.      Allergies  Review of patient's allergies indicates no known allergies.  Home Medications   Prior to Admission medications   Medication Sig Start Date End Date Taking? Authorizing Provider  aspirin EC 81 MG tablet Take 486 mg by mouth daily as needed for mild pain.   Yes Historical Provider, MD  clindamycin (CLEOCIN) 300 MG capsule Take 300 mg by mouth 2 (two) times daily. 10/19/15  Yes Historical Provider, MD  docusate sodium (COLACE) 50 MG capsule Take 75 mg by mouth daily as needed for mild constipation.   Yes Historical Provider, MD  folic acid (FOLVITE) 1 MG tablet Take 5 tablets by mouth daily.  06/28/15  Yes Historical Provider, MD  magic mouthwash SOLN Take 10 mLs by mouth 3 (three) times daily as needed for mouth pain.   Yes Historical Provider, MD  Tetrahydrozoline HCl (VISINE OP) Place 1 drop into both eyes daily as needed (dry eyes).   Yes Historical Provider, MD  timolol (BETIMOL) 0.5 % ophthalmic solution Place 1 drop into both eyes at bedtime.   Yes Historical Provider, MD  traMADol (ULTRAM) 50 MG tablet Take 50 mg by mouth daily as needed for moderate pain.    Yes Historical Provider, MD  triamterene-hydrochlorothiazide (MAXZIDE-25) 37.5-25 MG tablet Take 0.5 tablets by mouth daily. 09/25/15  Yes Historical Provider, MD   BP 129/68 mmHg  Pulse 87  Temp(Src) 98 F (36.7 C) (Oral)  Resp 16  SpO2 95% Physical Exam  Constitutional: She is oriented to person, place, and time. She appears well-developed and well-nourished. No distress.  HENT:  Head: Normocephalic and atraumatic.  Eyes: EOM  are normal. Pupils are equal, round, and reactive to light.  Neck: Normal range of motion. Neck supple.  Cardiovascular: Normal rate and regular rhythm.  Exam reveals no gallop and no friction rub.   No murmur heard. Pulmonary/Chest: Effort normal. She has no wheezes. She has no rales.  Abdominal: Soft. She exhibits no distension. There is tenderness (Worsened the right upper quadrant negative Murphy sign). There is no rebound and no guarding.  Musculoskeletal: She exhibits no edema or tenderness.  Neurological: She is alert and oriented to person, place, and time.  Skin: Skin is warm and dry. She is not diaphoretic.  Psychiatric: She has a normal mood and affect. Her behavior is normal.  Nursing note and vitals  reviewed.   ED Course  Procedures (including critical care time) Labs Review Labs Reviewed  CBC WITH DIFFERENTIAL/PLATELET - Abnormal; Notable for the following:    RBC 2.91 (*)    Hemoglobin 9.5 (*)    HCT 27.0 (*)    Lymphs Abs 0.5 (*)    All other components within normal limits  COMPREHENSIVE METABOLIC PANEL - Abnormal; Notable for the following:    Sodium 129 (*)    Chloride 99 (*)    Calcium 8.5 (*)    Total Protein 6.0 (*)    Albumin 3.3 (*)    AST 55 (*)    Alkaline Phosphatase 154 (*)    All other components within normal limits  URINALYSIS, ROUTINE W REFLEX MICROSCOPIC (NOT AT [facility] - Abnormal; Notable for the following:    Hgb urine dipstick TRACE (*)    Leukocytes, UA SMALL (*)    All other components within normal limits  LIPASE, BLOOD  URINE MICROSCOPIC-ADD ON  I-STAT CG4 LACTIC ACID, ED  Randolm Idol, ED    Imaging Review US Abdomen Limited Ruq  10/27/2015  CLINICAL DATA:  Acute abdominal pain with nausea and vomiting, history of pancreatitis EXAM: US ABDOMEN LIMITED - RIGHT UPPER QUADRANT COMPARISON:  None. FINDINGS: Gallbladder: No gallstones or wall thickening visualized. No sonographic Murphy sign noted. Common bile duct: Diameter: 2.9 mm.  Liver: No focal lesion identified. Within normal limits in parenchymal echogenicity. IMPRESSION: No acute abnormality noted. Electronically Signed   By: Inez Catalina M.D.   On: 10/27/2015 13:49   I have personally reviewed and evaluated these images and lab results as part of my medical decision-making.   EKG Interpretation   Date/Time:  Wednesday October 27 2015 10:13:46 EST Ventricular Rate:  70 PR Interval:  207 QRS Duration: 100 QT Interval:  410 QTC Calculation: 442 R Axis:   81 Text Interpretation:  Sinus rhythm Borderline right axis deviation  Nonspecific T abnormalities, lateral leads No significant change since  last tracing Confirmed by Amylynn Fano MD, DANIEL ([id number]) on 10/27/2015 10:21:42  AM      MDM   Final diagnoses:  RUQ abdominal pain  Non-intractable vomiting with nausea, vomiting of unspecified type  Weakness    69 yo F with a chief complaint of right upper quadrant abdominal pain. Story concerning for choline lithiasis versus cholecystitis. Patient also with a history of pancreatitis. CBC CMP lipase UA right upper quadrant ultrasound. Patient is elderly with some vague symptoms radiating up to the chest will obtain an EKG and i-STAT troponin. EKG unremarkable.  Right upper quadrant ultrasound negative. Patient pain-free after initial exam, laboratory evaluation with some mild liver enzyme function elevation. Discussed results with patient. This patient is pain-free and had the sudden right upper quadrant pain suspect possible gallbladder etiology. Discussed with patient possibility of outpatient HIDA scan. She will discuss with her gastroenterologist. Discussed risks and benefits of CT scan while she is in the department. Patient currently elects to defer this. Agrees to return for sudden worsening pain.  3:20 PM:  I have discussed the diagnosis/risks/treatment options with the patient and family and believe the pt to be eligible for discharge home to follow-up with PCP,  GI. We also discussed returning to the ED immediately if new or worsening sx occur. We discussed the sx which are most concerning (e.g., sudden worsening pain, fever, inability to tolerate by mouth) that necessitate immediate return. Medications administered to the patient during their visit and any new prescriptions provided to the patient  are listed below.  Medications given during this visit Medications  0.9 %  sodium chloride infusion (0 mLs Intravenous Stopped 10/27/15 1157)  morphine 4 MG/ML injection 4 mg (4 mg Intravenous Given 10/27/15 1006)  ondansetron (ZOFRAN) injection 4 mg (4 mg Intravenous Given 10/27/15 1006)    Discharge Medication List as of 10/27/2015  2:20 PM      The patient appears reasonably screen and/or stabilized for discharge and I doubt any other medical condition or other [facility] requiring further screening, evaluation, or treatment in the ED at this time prior to discharge.    Deno Etienne, DO 10/27/15 1520

## 2015-11-15 DIAGNOSIS — M3500 Sicca syndrome, unspecified: Secondary | ICD-10-CM | POA: Diagnosis not present

## 2015-11-18 DIAGNOSIS — I011 Acute rheumatic endocarditis: Secondary | ICD-10-CM

## 2015-11-18 HISTORY — DX: Acute rheumatic endocarditis: I01.1

## 2015-11-22 DIAGNOSIS — M4120 Other idiopathic scoliosis, site unspecified: Secondary | ICD-10-CM | POA: Diagnosis not present

## 2015-11-22 DIAGNOSIS — M4316 Spondylolisthesis, lumbar region: Secondary | ICD-10-CM | POA: Diagnosis not present

## 2015-11-22 DIAGNOSIS — M4135 Thoracogenic scoliosis, thoracolumbar region: Secondary | ICD-10-CM | POA: Diagnosis not present

## 2015-11-22 DIAGNOSIS — M545 Low back pain: Secondary | ICD-10-CM | POA: Diagnosis not present

## 2015-11-22 DIAGNOSIS — M5416 Radiculopathy, lumbar region: Secondary | ICD-10-CM | POA: Diagnosis not present

## 2015-11-29 ENCOUNTER — Telehealth: Payer: Self-pay | Admitting: Gastroenterology

## 2015-11-29 DIAGNOSIS — R52 Pain, unspecified: Secondary | ICD-10-CM | POA: Diagnosis not present

## 2015-11-29 DIAGNOSIS — D649 Anemia, unspecified: Secondary | ICD-10-CM | POA: Diagnosis not present

## 2015-11-29 DIAGNOSIS — E7800 Pure hypercholesterolemia, unspecified: Secondary | ICD-10-CM | POA: Diagnosis not present

## 2015-11-29 DIAGNOSIS — I10 Essential (primary) hypertension: Secondary | ICD-10-CM | POA: Diagnosis not present

## 2015-11-30 NOTE — Telephone Encounter (Signed)
I have talked with the patient. She is scheduled for 12/09/15 at 1:45 pm.

## 2015-12-06 ENCOUNTER — Encounter: Payer: Self-pay | Admitting: Gastroenterology

## 2015-12-06 DIAGNOSIS — M810 Age-related osteoporosis without current pathological fracture: Secondary | ICD-10-CM | POA: Diagnosis not present

## 2015-12-06 DIAGNOSIS — M3320 Polymyositis, organ involvement unspecified: Secondary | ICD-10-CM | POA: Diagnosis not present

## 2015-12-06 DIAGNOSIS — M3500 Sicca syndrome, unspecified: Secondary | ICD-10-CM | POA: Diagnosis not present

## 2015-12-06 DIAGNOSIS — M150 Primary generalized (osteo)arthritis: Secondary | ICD-10-CM | POA: Diagnosis not present

## 2015-12-09 ENCOUNTER — Encounter: Payer: Self-pay | Admitting: Physician Assistant

## 2015-12-09 ENCOUNTER — Other Ambulatory Visit (HOSPITAL_COMMUNITY): Payer: Self-pay | Admitting: Neurosurgery

## 2015-12-09 ENCOUNTER — Ambulatory Visit (INDEPENDENT_AMBULATORY_CARE_PROVIDER_SITE_OTHER): Payer: Medicare HMO | Admitting: Physician Assistant

## 2015-12-09 ENCOUNTER — Other Ambulatory Visit: Payer: Self-pay

## 2015-12-09 ENCOUNTER — Other Ambulatory Visit (INDEPENDENT_AMBULATORY_CARE_PROVIDER_SITE_OTHER): Payer: Medicare HMO

## 2015-12-09 VITALS — BP 140/88 | HR 80 | Ht 58.5 in | Wt 112.5 lb

## 2015-12-09 DIAGNOSIS — R1011 Right upper quadrant pain: Secondary | ICD-10-CM

## 2015-12-09 DIAGNOSIS — D649 Anemia, unspecified: Secondary | ICD-10-CM

## 2015-12-09 DIAGNOSIS — K219 Gastro-esophageal reflux disease without esophagitis: Secondary | ICD-10-CM

## 2015-12-09 DIAGNOSIS — M4135 Thoracogenic scoliosis, thoracolumbar region: Secondary | ICD-10-CM

## 2015-12-09 DIAGNOSIS — R1310 Dysphagia, unspecified: Secondary | ICD-10-CM | POA: Diagnosis not present

## 2015-12-09 DIAGNOSIS — R1012 Left upper quadrant pain: Secondary | ICD-10-CM

## 2015-12-09 LAB — CBC WITH DIFFERENTIAL/PLATELET
BASOS PCT: 0.6 % (ref 0.0–3.0)
Basophils Absolute: 0 10*3/uL (ref 0.0–0.1)
EOS ABS: 0.2 10*3/uL (ref 0.0–0.7)
EOS PCT: 4.6 % (ref 0.0–5.0)
HCT: 31.9 % — ABNORMAL LOW (ref 36.0–46.0)
Hemoglobin: 10.6 g/dL — ABNORMAL LOW (ref 12.0–15.0)
LYMPHS PCT: 31.7 % (ref 12.0–46.0)
Lymphs Abs: 1.6 10*3/uL (ref 0.7–4.0)
MCHC: 33.1 g/dL (ref 30.0–36.0)
MCV: 93.3 fl (ref 78.0–100.0)
Monocytes Absolute: 0.8 10*3/uL (ref 0.1–1.0)
Monocytes Relative: 16.4 % — ABNORMAL HIGH (ref 3.0–12.0)
NEUTROS ABS: 2.3 10*3/uL (ref 1.4–7.7)
NEUTROS PCT: 46.7 % (ref 43.0–77.0)
PLATELETS: 337 10*3/uL (ref 150.0–400.0)
RBC: 3.42 Mil/uL — ABNORMAL LOW (ref 3.87–5.11)
RDW: 14.1 % (ref 11.5–15.5)
WBC: 4.9 10*3/uL (ref 4.0–10.5)

## 2015-12-09 LAB — COMPREHENSIVE METABOLIC PANEL
ALT: 15 U/L (ref 0–35)
AST: 21 U/L (ref 0–37)
Albumin: 3.8 g/dL (ref 3.5–5.2)
Alkaline Phosphatase: 71 U/L (ref 39–117)
BILIRUBIN TOTAL: 0.4 mg/dL (ref 0.2–1.2)
BUN: 15 mg/dL (ref 6–23)
CO2: 29 meq/L (ref 19–32)
CREATININE: 0.6 mg/dL (ref 0.40–1.20)
Calcium: 9.8 mg/dL (ref 8.4–10.5)
Chloride: 97 mEq/L (ref 96–112)
GFR: 105.61 mL/min (ref >60.00)
GLUCOSE: 84 mg/dL (ref 70–99)
Potassium: 4.2 mEq/L (ref 3.5–5.1)
SODIUM: 132 meq/L — AB (ref 135–145)
Total Protein: 7.4 g/dL (ref 6.0–8.3)

## 2015-12-09 LAB — IBC PANEL
IRON: 51 ug/dL (ref 42–145)
Saturation Ratios: 13.9 % — ABNORMAL LOW (ref 20.0–50.0)
Transferrin: 262 mg/dL (ref 212.0–360.0)

## 2015-12-09 LAB — LIPASE: Lipase: 28 U/L (ref 11.0–59.0)

## 2015-12-09 LAB — AMYLASE: Amylase: 28 U/L (ref 27–131)

## 2015-12-09 LAB — FERRITIN: FERRITIN: 38.1 ng/mL (ref 10.0–291.0)

## 2015-12-09 LAB — GAMMA GT: GGT: 23 U/L (ref 7–51)

## 2015-12-09 LAB — CK: Total CK: 251 U/L — ABNORMAL HIGH (ref 7–177)

## 2015-12-09 LAB — VITAMIN B12: VITAMIN B 12: 360 pg/mL (ref 211–911)

## 2015-12-09 LAB — FOLATE: Folate: 17.6 ng/mL (ref >5.9)

## 2015-12-09 MED ORDER — OMEPRAZOLE 20 MG PO CPDR: 20.0000 mg | DELAYED_RELEASE_CAPSULE | Freq: Every day | ORAL | Status: DC

## 2015-12-09 NOTE — Progress Notes (Signed)
Agree with assessment and plan. Note, labs, and imaging reviewed. Recurrent upper abdominal pain with mild elevation in AST and AP, however unclear what is driving this process. 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 Labs obtained today and LFTs normal with mild CK elevation. Prior workup for pancreatitis unremarkable, although her GB remains intact, could have been due to sludge or passed a stone. Recent RUQ US unremarkable without biliary ductal dilation. In regards to her RUQ postprandial pain would recommend HIDA at this time to rule out GB dysfunction, and otherwise await barium study regarding dysphagia. May consider EGD pending her course, if pain persists without a clear etiology and if she has a cricopharyngeal stricture that may be amenable to dilation

## 2015-12-09 NOTE — Patient Instructions (Addendum)
You have been scheduled for a Barium Esophogram at [facility] Radiology (1st floor of the hospital) on 11-19-2015 at 9:30am. Please arrive 15 minutes prior to your appointment for registration. Make certain not to have anything to eat or drink 6 hours prior to your test. If you need to reschedule for any reason, please contact radiology at [phone] to do so. __________________________________________________________________ A barium swallow is an examination that concentrates on views of the esophagus. This tends to be a double contrast exam (barium and two liquids which, when combined, create a gas to distend the wall of the oesophagus) or single contrast (non-ionic iodine based). The study is usually tailored to your symptoms so a good history is essential. Attention is paid during the study to the form, structure and configuration of the esophagus, looking for functional disorders (such as aspiration, dysphagia, achalasia, motility and reflux) EXAMINATION You may be asked to change into a gown, depending on the type of swallow being performed. A radiologist and radiographer will perform the procedure. The radiologist will advise you of the type of contrast selected for your procedure and direct you during the exam. You will be asked to stand, sit or lie in several different positions and to hold a small amount of fluid in your mouth before being asked to swallow while the imaging is performed .In some instances you may be asked to swallow barium coated marshmallows to assess the motility of a solid food bolus. The exam can be recorded as a digital or video fluoroscopy procedure. POST PROCEDURE It will take 1-2 days for the barium to pass through your system. To facilitate this, it is important, unless otherwise directed, to increase your fluids for the next 24-48hrs and to resume your normal diet.  This test typically takes about 30 minutes to  perform. __________________________________________________________________________________   [name] have been scheduled for a HIDA scan at [facility] Radiology (1st floor) on 12-24-2015. Please arrive 15 minutes prior to your scheduled appointment at  [phone]. Make certain not to have anything to eat or drink at least 6 hours prior to your test. Should this appointment date or time not work well for you, please call radiology scheduling at [phone].  _____________________________________________________________________ hepatobiliary (HIDA) scan is an imaging procedure used to diagnose problems in the liver, gallbladder and bile ducts. In the HIDA scan, a radioactive chemical or tracer is injected into a vein in your arm. The tracer is handled by the liver like bile. Bile is a fluid produced and excreted by your liver that helps your digestive system break down fats in the foods you eat. Bile is stored in your gallbladder and the gallbladder releases the bile when you eat a meal. A special nuclear medicine scanner (gamma camera) tracks the flow of the tracer from your liver into your gallbladder and small intestine.  During your HIDA scan  You'll be asked to change into a hospital gown before your HIDA scan begins. Your health care team will position you on a table, usually on your back. The radioactive tracer is then injected into a vein in your arm.The tracer travels through your bloodstream to your liver, where it's taken up by the bile-producing cells. The radioactive tracer travels with the bile from your liver into your gallbladder and through your bile ducts to your small intestine.You may feel some pressure while the radioactive tracer is injected into your vein. As you lie on the table, a special gamma camera is positioned over your abdomen taking pictures of  the tracer as it moves through your body. The gamma camera takes pictures continually for about an hour. You'll need to keep still during the  HIDA scan. This can become uncomfortable, but you may find that you can lessen the discomfort by taking deep breaths and thinking about other things. Tell your health care team if you're uncomfortable. The radiologist will watch on a computer the progress of the radioactive tracer through your body. The HIDA scan may be stopped when the radioactive tracer is seen in the gallbladder and enters your small intestine. This typically takes about an hour. In some cases extra imaging will be performed if original images aren't satisfactory, if morphine is given to help visualize the gallbladder or if the medication CCK is given to look at the contraction of the gallbladder. This test typically takes 2 hours to complete. ________________________________________________________________________  We have sent the following medications to your pharmacy for you to pick up at your convenience: [place] provider has requested that you go to the basement for lab work before leaving today.

## 2015-12-09 NOTE — Progress Notes (Signed)
Patient ID: [name], female   DOB: May 08, 1946, 69 y.o.   MRN: [id number]     History of Present Illness: AALYA BADY  Who was followed medically by Dr. Deland Pretty of [facility]. She has a past medical history of polymyositis, showed Jones, osteoarthritis, osteoporosis, hypercholesterolemia, ACE inhibitor intolerance, family history of colon cancer-next colonoscopy due to thousand 43, shingles with post herpetic neuralgia, abnormal Pap smear , Filamentary keratitis of both eyes, Raynaud's phenomenon, spinal stenosis, open angle glaucoma, iron deficiency anemia , GERD, atherosclerosis of the aorta, pancreatitis, hyperreflexia, tinnitus, leukopenia, chronic constipation, and low vitamin D.    She was hospitalized in 2014 with acute pancreatitis etiology was not determined. Ultrasound was negative. MRCP had a normal gallbladder. No intrahepatic biliary ductal dilatation. No evidence of obstructive stone or mass. No evidence of pancreatitis. She was recently in the emergency room  November 9 with right upper quadrant pain associated with nausea and vomiting. AST was noted to be 55 and alkaline phosphatase was 154. ALT was 55 total bili was 0.8. Abdominal ultrasound showed no gallstones or wall thickening. No sonographic Murphy sign. Common bile duct 2.9 mm. Liver with no focal lesion identified. Within normal limits in parenchymal echogenicity. 2 weeks after that visit she again had a postprandial episode of severe epigastric pain associated with nausea. She was evaluated by her primary care physician and on December 12 was noted to have a total bili of 0.3, alkaline phosphatase of 79, AST of 35, ALT of 20. Since then she continues to have some intermittent postprandial right upper quadrant discomfort and occasional left upper quadrant discomfort.   She also has a long-standing history of GERD and dysphagia she has had 3 dilations in the past. In 2011 she was evaluated by Dr.  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 This relieved her symptoms still approximately 2013 when she again began to have dysphagia. Her last esophagram was in March 2013 and showed aspiration with coughing with the initial swallow a barium this was a small amount of aspiration and no additional aspiration was noted during the course of the study. Tight cricopharyngeal muscle however improved from the preop study of February 2011. The lumen is slightly smaller than on the preoperative study. Negative for Zenker's diverticulum. She states she was offered endoscopy with Botox but declined. She now feels as if food is getting stuck in the proximal esophagus.  She also been having "indigestion" acid reflux. She has been on a PPI in the past with good relief but discontinued it because she was feeling better. She also reports that she has a long-standing history of anemia and has again been told she is anemic. She has had iron deficiency anemia dating back to early 2000. GI workup was carried out and have been negative.   Past Medical History  Diagnosis Date  . HTN (hypertension)   . Glaucoma   . Sjogren's syndrome ([place])   . Raynaud phenomenon   . Polymyositis ([employer])   . GERD (gastroesophageal reflux disease)   . Swallowing difficulty     unable to swallow whole pills  . AAA (abdominal aortic aneurysm) ([place])     small      report in epic 11/11 states no aaa    . Iron deficiency anemia     hx  . Family history of anesthesia complication     "sister has severe sleep apnea; she has alot of  trouble w/anesthesia" (09/08/2013)  . High cholesterol     "not severe; can't take the medicine" (09/08/2013)  . Multiple thyroid nodules   . Migraines     "hurt like a migraine; don't get them like I did when I was younger" (09/08/2013)  . OA (osteoarthritis)   . Chronic lower back pain     "for 6 years; relieved since back OR 07/2013" (09/08/2013)  . Swelling of left  lower extremity   . Pancreatitis   . Diverticulosis   . Shingles   . Osteoporosis     Past Surgical History  Procedure Laterality Date  . Cataract extraction w/ intraocular lens  implant, bilateral Bilateral 12/2010-01/2011  . Esophageal dilation      "a few times before 02/2010; not since" (09/08/2013)  . Repair zenker's diverticula  02/2010    "diverticulectomy w/zenker's pouch correction" (09/08/2013)  . Dental surgery  11/2011-12/2011    one on each side in my bottom jaw; dental implants" (09/08/2013)  . Lumbar fusion  Aug 14, 2013    Dr. Consuello Masse   Family History  Problem Relation Age of Onset  . Ovarian cancer Mother   . Hypertension Father   . Heart disease Father   . Breast cancer Father     Age 32  . Colon cancer Father   . Psoriasis Sister   . Sarcoidosis Sister   . Arthritis Sister     psoriatic arthritis  . Diabetes Sister   . Arthritis Maternal Uncle     rheumatiod arthritis  . Diabetes Son   . Heart disease Brother    Social History  Substance Use Topics  . Smoking status: Never Smoker   . Smokeless tobacco: Never Used  . Alcohol Use: No   Current Outpatient Prescriptions  Medication Sig Dispense Refill  . aspirin EC 81 MG tablet Take 486 mg by mouth daily as needed for mild pain.    Marland Kitchen docusate sodium (COLACE) 50 MG capsule Take 75 mg by mouth daily as needed for mild constipation.    . magic mouthwash SOLN Take 10 mLs by mouth 3 (three) times daily as needed for mouth pain.    . Tetrahydrozoline HCl (VISINE OP) Place 1 drop into both eyes daily as needed (dry eyes).    . timolol (BETIMOL) 0.5 % ophthalmic solution Place 1 drop into both eyes at bedtime.    . traMADol (ULTRAM) 50 MG tablet Take 50 mg by mouth daily as needed for moderate pain.     Marland Kitchen triamterene-hydrochlorothiazide (MAXZIDE-25) 37.5-25 MG tablet Take 0.5 tablets by mouth daily.  0  . omeprazole (PRILOSEC) 20 MG capsule Take 1 capsule (20 mg total) by mouth daily. Take one 30 min prior to  breakfast. 30 capsule 3   No current facility-administered medications for this visit.   No Known Allergies   Review of Systems:  per history of present illness otherwise negative.  LAB RESULTS:  see history of present illness  Studies:  Abdomen Complete   Status: Final result       PACS Images     Show images for US Abdomen Complete     Study Result     CLINICAL DATA: Abdominal pain. Back pain. Elevated lipase.  EXAM: ABDOMEN ULTRASOUND  COMPARISON: None.  FINDINGS: Gallbladder  No gallstones or wall thickening. Negative sonographic Murphy's sign.  Common bile duct  Diameter: 4 mm, normal.  Liver  No focal lesion identified. Within normal limits in parenchymal echogenicity.  IVC  No abnormality  visualized.  Pancreas  Visualized portion unremarkable.  Spleen  11 cm. Normal echotexture.  Right Kidney  Length: 10.7 cm. Echogenicity within normal limits. No mass or hydronephrosis visualized.  Left Kidney  Length: 10.1 cm. Echogenicity within normal limits. No mass or hydronephrosis visualized.  Abdominal aorta  No aneurysm visualized.  IMPRESSION: Normal abdominal ultrasound.   Electronically Signed  By: Dereck Ligas M.D.  On: 09/08/2013 19:45       Physical Exam: BP 140/88 mmHg  Pulse 80  Ht 4' 10.5" (1.486 m)  Wt 112 lb 8 oz (51.03 kg)  BMI 23.11 kg/m2 General: Pleasant, well developed ,female in no acute distress Head: Normocephalic and atraumatic Eyes:  sclerae anicteric, conjunctiva pink  Ears: Normal auditory acuity Lungs: Clear throughout to auscultation Heart: Regular rate and rhythm Abdomen: Soft, non distended,  Mild right upper quadrant and epigastric tenderness to palpation with no rebound or guarding, No masses, no hepatomegaly. Normal bowel sounds Musculoskeletal: Symmetrical with no gross deformities  Extremities: No edema  Neurological: Alert oriented x 4, grossly  nonfocal Psychological:  Alert and cooperative. Normal mood and affect  Assessment and Recommendations:  #1. Dysphagia. Patient has had previous strictures and has had repair of a Zenker's diverticulum. Will obtain a repeat barium swallow to evaluate for progression of cricopharyngeal tightening /narrowing etc. Further recommendations will be made pending the findings of that test. In the meantime, the patient has been instructed to cut her food into very small pieces and to chew thoroughly.   #2. GERD. An antireflux regimen has been reviewed and she will again be given a trial of omeprazole 20 mg 1 by mouth every morning 30 minutes prior to breakfast.    #3. Right upper quadrant pain and abnormal LFTs. Question if patient may have passed a stone or sludge , or possibly have some gallbladder dysfunction. He CBC with differential, comprehensive metabolic panel, amylase, lipase, CK, and GGT will be obtained. She will be scheduled for a hiatus scan with CCK to evaluate gallbladder function.   #4. Anemia. Recent MCV hasn't been normal. We'll repeat CBC with iron studies and check the stool for occult blood.    further recommendations will be made pending the findings of the above.      Kelleen Stolze, Deloris Ping 12/09/2015,

## 2015-12-10 ENCOUNTER — Telehealth: Payer: Self-pay | Admitting: *Deleted

## 2015-12-10 NOTE — Telephone Encounter (Signed)
Patient wanted to let Cecille Rubin Hvozdovic, PA-C know she did saw a hematologist for iron def. Anemia years ago.(record is in EPIC).

## 2015-12-16 ENCOUNTER — Ambulatory Visit (HOSPITAL_COMMUNITY)
Admission: RE | Admit: 2015-12-16 | Discharge: 2015-12-16 | Disposition: A | Discharge status: Home / Self Care | Payer: Medicare HMO | Source: Ambulatory Visit | Attending: Physician Assistant | Admitting: Physician Assistant

## 2015-12-16 DIAGNOSIS — K224 Dyskinesia of esophagus: Secondary | ICD-10-CM | POA: Diagnosis not present

## 2015-12-16 DIAGNOSIS — R1011 Right upper quadrant pain: Secondary | ICD-10-CM

## 2015-12-16 DIAGNOSIS — K219 Gastro-esophageal reflux disease without esophagitis: Secondary | ICD-10-CM | POA: Insufficient documentation

## 2015-12-16 DIAGNOSIS — R1310 Dysphagia, unspecified: Secondary | ICD-10-CM | POA: Insufficient documentation

## 2015-12-16 DIAGNOSIS — R1012 Left upper quadrant pain: Secondary | ICD-10-CM

## 2015-12-16 DIAGNOSIS — D649 Anemia, unspecified: Secondary | ICD-10-CM

## 2015-12-17 DIAGNOSIS — H401112 Primary open-angle glaucoma, right eye, moderate stage: Secondary | ICD-10-CM | POA: Diagnosis not present

## 2015-12-24 ENCOUNTER — Encounter (HOSPITAL_COMMUNITY)
Admission: RE | Admit: 2015-12-24 | Discharge: 2015-12-24 | Disposition: A | Discharge status: Home / Self Care | Payer: Medicare HMO | Source: Ambulatory Visit | Attending: Physician Assistant | Admitting: Physician Assistant

## 2015-12-24 DIAGNOSIS — R1310 Dysphagia, unspecified: Secondary | ICD-10-CM | POA: Diagnosis not present

## 2015-12-24 DIAGNOSIS — R1012 Left upper quadrant pain: Secondary | ICD-10-CM | POA: Insufficient documentation

## 2015-12-24 DIAGNOSIS — D649 Anemia, unspecified: Secondary | ICD-10-CM | POA: Diagnosis not present

## 2015-12-24 DIAGNOSIS — R1011 Right upper quadrant pain: Secondary | ICD-10-CM

## 2015-12-24 DIAGNOSIS — K219 Gastro-esophageal reflux disease without esophagitis: Secondary | ICD-10-CM

## 2015-12-24 DIAGNOSIS — R109 Unspecified abdominal pain: Secondary | ICD-10-CM | POA: Diagnosis not present

## 2015-12-24 MED ORDER — TECHNETIUM TC 99M MEBROFENIN IV KIT
5.0000 | PACK | Freq: Once | INTRAVENOUS | Status: AC | PRN
  Administered 2015-12-24: 5.3 via INTRAVENOUS

## 2015-12-28 ENCOUNTER — Ambulatory Visit (HOSPITAL_COMMUNITY): Payer: Medicare HMO

## 2015-12-28 DIAGNOSIS — M150 Primary generalized (osteo)arthritis: Secondary | ICD-10-CM | POA: Diagnosis not present

## 2015-12-28 DIAGNOSIS — M810 Age-related osteoporosis without current pathological fracture: Secondary | ICD-10-CM | POA: Diagnosis not present

## 2015-12-28 DIAGNOSIS — M3500 Sicca syndrome, unspecified: Secondary | ICD-10-CM | POA: Diagnosis not present

## 2015-12-28 DIAGNOSIS — M3320 Polymyositis, organ involvement unspecified: Secondary | ICD-10-CM | POA: Diagnosis not present

## 2015-12-30 ENCOUNTER — Ambulatory Visit (INDEPENDENT_AMBULATORY_CARE_PROVIDER_SITE_OTHER): Payer: Medicare HMO | Admitting: Gynecology

## 2015-12-30 ENCOUNTER — Encounter: Payer: Self-pay | Admitting: Gynecology

## 2015-12-30 VITALS — BP 120/70

## 2015-12-30 DIAGNOSIS — N814 Uterovaginal prolapse, unspecified: Secondary | ICD-10-CM | POA: Diagnosis not present

## 2015-12-30 DIAGNOSIS — N8111 Cystocele, midline: Secondary | ICD-10-CM | POA: Diagnosis not present

## 2015-12-30 NOTE — Patient Instructions (Addendum)
Follow up in 4 months for your next pessary check

## 2015-12-30 NOTE — Progress Notes (Signed)
[name] [birth year] [id number]        70 y.o.  [id number] Presents for pessary check with history of cystocele and uterine prolapse using a ring pessary with support with good results. Did note 2 days of spotting several weeks ago. No pain.  Past medical history,surgical history, problem list, medications, allergies, family history and social history were all reviewed and documented in the EPIC chart.  Directed ROS with pertinent positives and negatives documented in the history of present illness/assessment and plan.  Exam: [name] assistant Filed Vitals:   12/30/15 1144  BP: 120/70   General appearance:  Normal Abdomen soft nontender without masses guarding rebound Pelvic external BUS vagina with atrophic changes. Minimal cervical mucosal irritation without active bleeding. Second degree cystocele. First-degree uterine prolapse. Uterus normal size midline mobile nontender. Adnexa without masses or tenderness  Ring pessary was removed cleansed and replaced without difficulty  Assessment/Plan:  70 y.o. [name] with cystocele/uterine prolapse with good response to pessary. 2 days of spotting which I think was due to the cervical irritation. Does not appear to be enough toward stopping the pessary or adding estrogen at this point. Will monitor for now. Report any further bleeding. Follow up in 4 months for her next pessary check.    Sabrina Auerbach MD, 12:40 PM 12/30/2015

## 2015-12-31 ENCOUNTER — Ambulatory Visit (HOSPITAL_COMMUNITY)
Admission: RE | Admit: 2015-12-31 | Discharge: 2015-12-31 | Disposition: A | Discharge status: Home / Self Care | Payer: Medicare HMO | Source: Ambulatory Visit | Attending: Neurosurgery | Admitting: Neurosurgery

## 2015-12-31 DIAGNOSIS — M5124 Other intervertebral disc displacement, thoracic region: Secondary | ICD-10-CM | POA: Diagnosis not present

## 2015-12-31 DIAGNOSIS — M4135 Thoracogenic scoliosis, thoracolumbar region: Secondary | ICD-10-CM | POA: Diagnosis not present

## 2015-12-31 DIAGNOSIS — M47814 Spondylosis without myelopathy or radiculopathy, thoracic region: Secondary | ICD-10-CM | POA: Insufficient documentation

## 2015-12-31 DIAGNOSIS — M79604 Pain in right leg: Secondary | ICD-10-CM | POA: Diagnosis not present

## 2016-01-04 ENCOUNTER — Other Ambulatory Visit (INDEPENDENT_AMBULATORY_CARE_PROVIDER_SITE_OTHER): Payer: Medicare HMO

## 2016-01-04 DIAGNOSIS — D649 Anemia, unspecified: Secondary | ICD-10-CM

## 2016-01-04 DIAGNOSIS — K219 Gastro-esophageal reflux disease without esophagitis: Secondary | ICD-10-CM

## 2016-01-04 DIAGNOSIS — R1310 Dysphagia, unspecified: Secondary | ICD-10-CM

## 2016-01-04 LAB — HEMOCCULT SLIDES (X 3 CARDS)
Fecal Occult Blood: NEGATIVE
OCCULT 1: NEGATIVE
OCCULT 2: NEGATIVE
OCCULT 3: NEGATIVE
OCCULT 4: NEGATIVE
OCCULT 5: NEGATIVE

## 2016-01-24 DIAGNOSIS — M5416 Radiculopathy, lumbar region: Secondary | ICD-10-CM | POA: Diagnosis not present

## 2016-01-24 DIAGNOSIS — M4135 Thoracogenic scoliosis, thoracolumbar region: Secondary | ICD-10-CM | POA: Diagnosis not present

## 2016-01-24 DIAGNOSIS — M4316 Spondylolisthesis, lumbar region: Secondary | ICD-10-CM | POA: Diagnosis not present

## 2016-01-24 DIAGNOSIS — M545 Low back pain: Secondary | ICD-10-CM | POA: Diagnosis not present

## 2016-01-24 DIAGNOSIS — M4806 Spinal stenosis, lumbar region: Secondary | ICD-10-CM | POA: Diagnosis not present

## 2016-02-08 ENCOUNTER — Other Ambulatory Visit: Payer: Self-pay | Admitting: Neurosurgery

## 2016-02-08 DIAGNOSIS — M48061 Spinal stenosis, lumbar region without neurogenic claudication: Secondary | ICD-10-CM

## 2016-02-09 DIAGNOSIS — H401112 Primary open-angle glaucoma, right eye, moderate stage: Secondary | ICD-10-CM | POA: Diagnosis not present

## 2016-02-09 DIAGNOSIS — H401123 Primary open-angle glaucoma, left eye, severe stage: Secondary | ICD-10-CM | POA: Diagnosis not present

## 2016-02-15 ENCOUNTER — Encounter: Payer: Self-pay | Admitting: Gastroenterology

## 2016-02-15 ENCOUNTER — Other Ambulatory Visit (HOSPITAL_COMMUNITY): Payer: Self-pay | Admitting: Gastroenterology

## 2016-02-15 ENCOUNTER — Ambulatory Visit (INDEPENDENT_AMBULATORY_CARE_PROVIDER_SITE_OTHER): Payer: Medicare HMO | Admitting: Gastroenterology

## 2016-02-15 VITALS — BP 118/64 | HR 52 | Ht 58.5 in | Wt 115.0 lb

## 2016-02-15 DIAGNOSIS — M609 Myositis, unspecified: Secondary | ICD-10-CM | POA: Diagnosis not present

## 2016-02-15 DIAGNOSIS — Z1211 Encounter for screening for malignant neoplasm of colon: Secondary | ICD-10-CM | POA: Diagnosis not present

## 2016-02-15 DIAGNOSIS — M791 Myalgia: Secondary | ICD-10-CM

## 2016-02-15 DIAGNOSIS — IMO0001 Reserved for inherently not codable concepts without codable children: Secondary | ICD-10-CM

## 2016-02-15 DIAGNOSIS — R1310 Dysphagia, unspecified: Secondary | ICD-10-CM

## 2016-02-15 NOTE — Progress Notes (Signed)
HPI :  70 y/o female with PMH of polymyositis, Raynaud's phenomenon, GERD, pancreatitis,chronic constipation, here for reassessment. She is a new patient to me to be evaluated for the following issues:   Dysphagia - she has a history of dysphagia longstandingand had a Zenker's diverticulum about 8 years ago, which was surgically treated. She reports the surgery helped her dysphagia but her symptoms have since came back. She reports dysphagia in her sternal notch. She has both dysphagia to solids and liquids. She thinks she needs to swallow liquids 3 x's in order to be able to swallow it down. She reports she continually needs to spit things up from the back of her throat. When she swallows she thinks she has had coughing and occasional nasal regurgitation which she thinks is new as well. She thinks her last EGD was around 2011 or so. She thinks she also feels a "fullness" in her esophagus after she eats. She sometimes thinks she "can't breath" due to symptoms of dysphagia. Liquids is what bothers her the most. She denies pyrosis at baseline. She is taking prilosec 20mg  once daily to twice per day. Since her last visit with our clinic she had a barium study showing mid to lower esophageal dysmotility, without narrowing or stricture. She declined to attempt to swallow the tablet.  History of abdominal pain. She denies alcohol use. Episode in 2014 with acute pancreatitis with normal LFTS, RUQ [place], and follow up MCRP. She had another episode of abdominal pain in November and had a very mild increase in AP and AST, and then December in which she had normal LFTS. CK mildly elevated. She has a history of polymyositis. On plaquenil and off methotrexate (ulcerations due to methotrexate). She has had a HIDA scan since our last visit which was normal. She denies any further episodes of abdominal pain since December.   Constipation - taking fiber supplements PRN, and using dietary fiber which normally helps. Father  had colon cancer at age around 23s.  Colonoscopy 2013 - tics otherwise normal, f/u 2018.    Past Medical History  Diagnosis Date  . HTN (hypertension)   . Glaucoma   . Sjogren's syndrome ([place])   . Raynaud phenomenon   . Polymyositis ([place])   . GERD (gastroesophageal reflux disease)   . Swallowing difficulty     unable to swallow whole pills  . AAA (abdominal aortic aneurysm) ([place])     small      report in epic 11/11 states no aaa    . Iron deficiency anemia     hx  . Family history of anesthesia complication     "sister has severe sleep apnea; she has alot of trouble w/anesthesia" (09/08/2013)  . High cholesterol     "not severe; can't take the medicine" (09/08/2013)  . Multiple thyroid nodules   . Migraines     "hurt like a migraine; don't get them like I did when I was younger" (09/08/2013)  . OA (osteoarthritis)   . Chronic lower back pain     "for 6 years; relieved since back OR 07/2013" (09/08/2013)  . Swelling of left lower extremity   . Pancreatitis   . Diverticulosis   . Shingles   . Osteoporosis      Past Surgical History  Procedure Laterality Date  . Cataract extraction w/ intraocular lens  implant, bilateral Bilateral 12/2010-01/2011  . Esophageal dilation      "a few times before 02/2010; not since" (09/08/2013)  . Repair zenker's diverticula  02/2010    "diverticulectomy w/zenker's pouch correction" (09/08/2013)  . Dental surgery  11/2011-12/2011    one on each side in my bottom jaw; dental implants" (09/08/2013)  . Lumbar fusion  Aug 14, 2013    Dr. Consuello Masse   Family History  Problem Relation Age of Onset  . Ovarian cancer Mother   . Hypertension Father   . Heart disease Father   . Breast cancer Father     Age 9  . Colon cancer Father   . Psoriasis Sister   . Sarcoidosis Sister   . Arthritis Sister     psoriatic arthritis  . Diabetes Sister   . Arthritis Maternal Uncle     rheumatiod arthritis  . Diabetes Son   . Heart disease Brother    Social History   Substance Use Topics  . Smoking status: Never Smoker   . Smokeless tobacco: Never Used  . Alcohol Use: No   Current Outpatient Prescriptions  Medication Sig Dispense Refill  . aspirin EC 81 MG tablet Take 486 mg by mouth daily as needed for mild pain.    Marland Kitchen diclofenac (VOLTAREN) 75 MG EC tablet Take 75 mg by mouth 2 (two) times daily.    . hydroxychloroquine (PLAQUENIL) 200 MG tablet Take 200 mg by mouth daily.    . magic mouthwash SOLN Take 10 mLs by mouth 3 (three) times daily as needed for mouth pain.    Marland Kitchen omeprazole (PRILOSEC) 20 MG capsule Take 1 capsule (20 mg total) by mouth daily. Take one 30 min prior to breakfast. (Patient taking differently: Take 20 mg by mouth 2 (two) times daily before a meal. Take one 30 min prior to breakfast.) 30 capsule 3  . predniSONE (DELTASONE) 5 MG tablet Take 5 mg by mouth daily with breakfast.    . Tetrahydrozoline HCl (VISINE OP) Place 1 drop into both eyes daily as needed (dry eyes).    . timolol (BETIMOL) 0.5 % ophthalmic solution Place 1 drop into both eyes at bedtime.    . traMADol (ULTRAM) 50 MG tablet Take 50 mg by mouth daily as needed for moderate pain.     Marland Kitchen triamterene-hydrochlorothiazide (MAXZIDE-25) 37.5-25 MG tablet Take 0.5 tablets by mouth daily.  0   No current facility-administered medications for this visit.   No Known Allergies   Review of Systems: All systems reviewed and negative except where noted in HPI.   Lab Results  Component Value Date   WBC 4.9 12/09/2015   HGB 10.6* 12/09/2015   HCT 31.9* 12/09/2015   MCV 93.3 12/09/2015   PLT 337.0 12/09/2015    Lab Results  Component Value Date   CREATININE 0.60 12/09/2015   BUN 15 12/09/2015   NA 132* 12/09/2015   K 4.2 12/09/2015   CL 97 12/09/2015   CO2 29 12/09/2015    Lab Results  Component Value Date   ALT 15 12/09/2015   AST 21 12/09/2015   ALKPHOS 71 12/09/2015   BILITOT 0.4 12/09/2015     Physical Exam: BP 118/64 mmHg  Pulse 52  Ht 4' 10.5"  (1.486 m)  Wt 115 lb (52.164 kg)  BMI 23.62 kg/m2 Constitutional: Pleasant,well-developed, female in no acute distress, with walker HEENT: Normocephalic and atraumatic. Conjunctivae are normal. No scleral icterus. Neck supple.  Cardiovascular: Normal rate, regular rhythm.  Pulmonary/chest: Effort normal and breath sounds normal. No wheezing, rales or rhonchi. Abdominal: Soft, nondistended, nontender. Bowel sounds active throughout. There are no masses palpable. No hepatomegaly. Extremities: no edema  Lymphadenopathy: No cervical adenopathy noted. Neurological: Alert and oriented to person place and time. Skin: Skin is warm and dry. No rashes noted. Psychiatric: Normal mood and affect. Behavior is normal.   ASSESSMENT AND PLAN: 70 y/o female with multiple medical problems as outlined above, with main complaint of dysphagia which we discussed today. She has a history of Zenker's diverticulum s/p surgery and now with recurrence of symptoms. She had a barium swallow which showed some dysmotility without recurrence of Zenker and no evidence of stenosis. Based on her symptoms as she describes them today with choking / nasal regurgitation with liquids, I want to rule out oropharyngeal dysphagia and will set her up for a modified barium swallow and speech path evaluation. At the same time, it is possible she has dysphagia from dysmotility noted on barium swallow and offered her an esophageal manometry to assess for this. I think between the two studies we may identify the cause of her symptoms. I discussed both with her at length and she wished to proceed, further recommendations pending this result.   I otherwise discussed her negative HIDA. She doesn't have chronic abdominal pain, but a few isolated episodes last year. Prior history of pancreatitis did not seem to be biliary in etiology. She's had only one occurrence of elevated LAEs, was in November with AST predominance and this could be result of  elevated CK due to myositis. She feels well without pain at this time, will monitor and let me know if she has recurrence.   Regarding her CRC screening she has had a colonoscopy in 2013 without adenomas. Given the age at her father's colon cancer diagnosis (> age 60), per the guidelines she is not due for repeat colonoscopy for 10 years from her last exam. However she is uncomfortable with waiting this long as she has them more frequently in the past. She wishes to have it again in 2018, we can reassess her at that time.   [place]ellar, MD [facility] Pager [phone]

## 2016-02-15 NOTE — Patient Instructions (Signed)
You have been scheduled for a modified barium swallow on 02/17/2016 at 11:30am at [facility].   Please arrive 15 minutes prior to your test for registration. You will go to [facility]  Radiology (1st Floor) for your appointment. Should you need to cancel or reschedule your appointment, please contact [phone] [name]) or [phone] [name]). _____________________________________________________________________ A Modified Barium Swallow Study, or MBS, is a special x-ray that is taken to check swallowing skills. It is carried out by a Stage manager and a Psychologist, clinical (SLP). During this test, yourmouth, throat, and esophagus, a muscular tube which connects your mouth to your stomach, is checked. The test will help you, your doctor, and the SLP plan what types of foods and liquids are easier for you to swallow. The SLP will also identify positions and ways to help you swallow more easily and safely. What will happen during an MBS? You will be taken to an x-ray room and seated comfortably. You will be asked to swallow small amounts of food and liquid mixed with barium. Barium is a liquid or paste that allows images of your mouth, throat and esophagus to be seen on x-ray. The x-ray captures moving images of the food you are swallowing as it travels from your mouth through your throat and into your esophagus. This test helps identify whether food or liquid is entering your lungs (aspiration). The test also shows which part of your mouth or throat lacks strength or coordination to move the food or liquid in the right direction. This test typically takes 30 minutes to 1 hour to complete. _______________________________________________________________________     [name] have been scheduled for an esophageal manometry at [facility] on 02/25/2016 at 10:30am. Please arrive 30 minutes prior to your procedure for registration. You will need to go to outpatient registration  (1st floor of the hospital) first. Please do not eat or drink anything for 6 hours prior to the test.  Make certain to bring your insurance cards as well as a complete list of medications.  Please remember the following:  1) Nothing to eat or drink after 12:00 midnight on the night before your test.  2) Hold all diabetic medications/insulin the morning of the test. You may eat and take             your medications after the test.  3) For 3 days prior to your test do not take: Dexilant, Prevacid, Nexium, Protonix,         Aciphex, Zegerid, Pantoprazole, Prilosec or omeprazole.  4) For 2 days prior to your test, do not take: Reglan, Tagamet, Zantac, Axid or Pepcid.  5) You MAY use an antacid such as Rolaids or Tums up to 12 hours prior to your test.  It will take at least 2 weeks to receive the results of this test from your physician. ------------------------------------------ ABOUT ESOPHAGEAL MANOMETRY Esophageal manometry (muh-NOM-uh-tree) is a test that gauges how well your esophagus works. Your esophagus is the long, muscular tube that connects your throat to your stomach. Esophageal manometry measures the rhythmic muscle contractions (peristalsis) that occur in your esophagus when you swallow. Esophageal manometry also measures the coordination and force exerted by the muscles of your esophagus.  During esophageal manometry, a thin, flexible tube (catheter) that contains sensors is passed through your nose, down your esophagus and into your stomach. Esophageal manometry can be helpful in diagnosing some mostly uncommon disorders that affect your esophagus.  Why it's done Esophageal manometry is used  to evaluate the movement (motility) of food through the esophagus and into the stomach. The test measures how well the circular bands of muscle (sphincters) at the top and bottom of your esophagus open and close, as well as the pressure, strength and pattern of the wave of esophageal muscle  contractions that moves food along.  What you can expect Esophageal manometry is an outpatient procedure done without sedation. Most people tolerate it well. You may be asked to change into a hospital gown before the test starts.  During esophageal manometry  While you are sitting up, a member of your health care team sprays your throat with a numbing medication or puts numbing gel in your nose or both.  A catheter is guided through your nose into your esophagus. The catheter may be sheathed in a water-filled sleeve. It doesn't interfere with your breathing. However, your eyes may water, and you may gag. You may have a slight nosebleed from irritation.  After the catheter is in place, you may be asked to lie on your back on an exam table, or you may be asked to remain seated.  You then swallow small sips of water. As you do, a computer connected to the catheter records the pressure, strength and pattern of your esophageal muscle contractions.  During the test, you'll be asked to breathe slowly and smoothly, remain as still as possible, and swallow only when you're asked to do so.  A member of your health care team may move the catheter down into your stomach while the catheter continues its measurements.  The catheter then is slowly withdrawn. The test usually lasts 20 to 30 minutes.  After esophageal manometry  When your esophageal manometry is complete, you may return to your normal activities  This test typically takes 30-45 minutes to complete. ________________________________________________________________________________

## 2016-02-16 DIAGNOSIS — M3500 Sicca syndrome, unspecified: Secondary | ICD-10-CM | POA: Diagnosis not present

## 2016-02-16 DIAGNOSIS — H401112 Primary open-angle glaucoma, right eye, moderate stage: Secondary | ICD-10-CM | POA: Diagnosis not present

## 2016-02-16 DIAGNOSIS — H401123 Primary open-angle glaucoma, left eye, severe stage: Secondary | ICD-10-CM | POA: Diagnosis not present

## 2016-02-16 DIAGNOSIS — H04123 Dry eye syndrome of bilateral lacrimal glands: Secondary | ICD-10-CM | POA: Diagnosis not present

## 2016-02-17 ENCOUNTER — Ambulatory Visit (HOSPITAL_COMMUNITY)
Admission: RE | Admit: 2016-02-17 | Discharge: 2016-02-17 | Disposition: A | Discharge status: Home / Self Care | Payer: Medicare HMO | Source: Ambulatory Visit | Attending: Gastroenterology | Admitting: Gastroenterology

## 2016-02-17 ENCOUNTER — Ambulatory Visit
Admission: RE | Admit: 2016-02-17 | Discharge: 2016-02-17 | Disposition: A | Discharge status: Home / Self Care | Payer: Medicare HMO | Source: Ambulatory Visit | Attending: Neurosurgery | Admitting: Neurosurgery

## 2016-02-17 DIAGNOSIS — R1310 Dysphagia, unspecified: Secondary | ICD-10-CM

## 2016-02-17 DIAGNOSIS — M48061 Spinal stenosis, lumbar region without neurogenic claudication: Secondary | ICD-10-CM

## 2016-02-17 DIAGNOSIS — R933 Abnormal findings on diagnostic imaging of other parts of digestive tract: Secondary | ICD-10-CM | POA: Diagnosis not present

## 2016-02-17 DIAGNOSIS — M4806 Spinal stenosis, lumbar region: Secondary | ICD-10-CM | POA: Diagnosis not present

## 2016-02-17 DIAGNOSIS — J392 Other diseases of pharynx: Secondary | ICD-10-CM | POA: Diagnosis not present

## 2016-02-17 MED ORDER — GADOBENATE DIMEGLUMINE 529 MG/ML IV SOLN
10.0000 mL | Freq: Once | INTRAVENOUS | Status: AC | PRN
  Administered 2016-02-17: 10 mL via INTRAVENOUS

## 2016-02-18 DIAGNOSIS — M4806 Spinal stenosis, lumbar region: Secondary | ICD-10-CM | POA: Diagnosis not present

## 2016-02-18 DIAGNOSIS — Z6824 Body mass index (BMI) 24.0-24.9, adult: Secondary | ICD-10-CM | POA: Diagnosis not present

## 2016-02-18 DIAGNOSIS — I10 Essential (primary) hypertension: Secondary | ICD-10-CM | POA: Diagnosis not present

## 2016-02-18 DIAGNOSIS — M4135 Thoracogenic scoliosis, thoracolumbar region: Secondary | ICD-10-CM | POA: Diagnosis not present

## 2016-02-18 DIAGNOSIS — M5416 Radiculopathy, lumbar region: Secondary | ICD-10-CM | POA: Diagnosis not present

## 2016-02-18 DIAGNOSIS — M545 Low back pain: Secondary | ICD-10-CM | POA: Diagnosis not present

## 2016-02-22 ENCOUNTER — Other Ambulatory Visit: Payer: Self-pay | Admitting: Neurosurgery

## 2016-02-25 ENCOUNTER — Ambulatory Visit (HOSPITAL_COMMUNITY)
Admission: RE | Admit: 2016-02-25 | Discharge: 2016-02-25 | Disposition: A | Discharge status: Home / Self Care | Payer: Medicare HMO | Source: Ambulatory Visit | Attending: Gastroenterology | Admitting: Gastroenterology

## 2016-02-25 ENCOUNTER — Encounter (HOSPITAL_COMMUNITY)
Admission: RE | Disposition: A | Discharge status: Home / Self Care | Payer: Self-pay | Source: Ambulatory Visit | Attending: Gastroenterology

## 2016-02-25 DIAGNOSIS — R1310 Dysphagia, unspecified: Secondary | ICD-10-CM | POA: Insufficient documentation

## 2016-02-25 HISTORY — PX: ESOPHAGEAL MANOMETRY: SHX5429

## 2016-02-25 SURGERY — MANOMETRY, ESOPHAGUS

## 2016-02-25 MED ORDER — LIDOCAINE VISCOUS 2 % MT SOLN
OROMUCOSAL | Status: AC
  Filled 2016-02-25: qty 15

## 2016-02-25 SURGICAL SUPPLY — 2 items
FACESHIELD LNG OPTICON STERILE (SAFETY) IMPLANT
GLOVE BIO SURGEON STRL SZ8 (GLOVE) ×4 IMPLANT

## 2016-03-01 ENCOUNTER — Telehealth: Payer: Self-pay | Admitting: Gastroenterology

## 2016-03-01 ENCOUNTER — Encounter (HOSPITAL_COMMUNITY): Payer: Self-pay | Admitting: Gastroenterology

## 2016-03-01 DIAGNOSIS — R1310 Dysphagia, unspecified: Secondary | ICD-10-CM | POA: Insufficient documentation

## 2016-03-01 NOTE — Telephone Encounter (Signed)
Esophageal manometry was performed. There is failure of peristalsis and the LES does not relax and is hypertensive. 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 Formal report to be scanned into Epic. The patient's last EGD was in 2011. Recommend an EGD now to rule out pseudoachalasia. Pending this is a normal study, will discuss options for treatment with her to include pneumatic bag dilation vs. Surgery. She also has oropharyngeal dysphagia noted on modified barium swallow.   I will contact the patient with results and recommendations

## 2016-03-01 NOTE — Telephone Encounter (Signed)
I called the patient and left a message, no answer, I will call back

## 2016-03-02 NOTE — Telephone Encounter (Signed)
[name] called and reviewed this result with the patient. Marshall am recommending an EGD to rule out pseudoachalasia and she wishes to proceed after discussing risks / benefits. She wishes to have this done ASAP. Marshall have an opening tomorrow if you can coordinate this for her, Marshall otherwise have openings next week as well. Can you please call her to coordinate EGD? Thanks much

## 2016-03-02 NOTE — Telephone Encounter (Signed)
Spoke with patient and scheduled prop EGD on 03/06/16 at 9:00 Am and previsit on 03/03/16 at 3:30 PM.

## 2016-03-03 ENCOUNTER — Ambulatory Visit (AMBULATORY_SURGERY_CENTER): Payer: Self-pay | Admitting: *Deleted

## 2016-03-03 VITALS — Ht <= 58 in | Wt 113.6 lb

## 2016-03-03 DIAGNOSIS — R1310 Dysphagia, unspecified: Secondary | ICD-10-CM

## 2016-03-03 NOTE — Progress Notes (Signed)
No allergies to eggs or soy. No problems with anesthesia.  No oxygen use  No diet drug use  

## 2016-03-06 ENCOUNTER — Encounter: Payer: Self-pay | Admitting: Gastroenterology

## 2016-03-06 ENCOUNTER — Ambulatory Visit (AMBULATORY_SURGERY_CENTER): Payer: Medicare HMO | Admitting: Gastroenterology

## 2016-03-06 VITALS — BP 138/60 | HR 81 | Temp 97.3°F | Resp 14 | Ht <= 58 in | Wt 113.0 lb

## 2016-03-06 DIAGNOSIS — K219 Gastro-esophageal reflux disease without esophagitis: Secondary | ICD-10-CM | POA: Diagnosis not present

## 2016-03-06 DIAGNOSIS — K2950 Unspecified chronic gastritis without bleeding: Secondary | ICD-10-CM | POA: Diagnosis not present

## 2016-03-06 DIAGNOSIS — I7300 Raynaud's syndrome without gangrene: Secondary | ICD-10-CM | POA: Diagnosis not present

## 2016-03-06 DIAGNOSIS — I10 Essential (primary) hypertension: Secondary | ICD-10-CM | POA: Diagnosis not present

## 2016-03-06 DIAGNOSIS — R1310 Dysphagia, unspecified: Secondary | ICD-10-CM

## 2016-03-06 MED ORDER — SODIUM CHLORIDE 0.9 % IV SOLN: 500.0000 mL | INTRAVENOUS | Status: DC

## 2016-03-06 NOTE — Progress Notes (Signed)
Report to PACU, RN, vss, BBS= Clear.  

## 2016-03-06 NOTE — Progress Notes (Signed)
Called to room to assist during endoscopic procedure.  Patient ID and intended procedure confirmed with present staff. Received instructions for my participation in the procedure from the performing physician.  

## 2016-03-06 NOTE — Op Note (Addendum)
[place] Patient Name: [name] Procedure Date: 03/06/2016 9:14 AM MRN: [id number] Endoscopist: Remo Lipps P. Havery Moros , MD Age: 70 Referring MD:  Date of Birth: Feb 19, 1946 Gender: Female Procedure:                Upper GI endoscopy Indications:              Dysphagia, Suspected achalasia Medicines:                Monitored Anesthesia Care Procedure:                Pre-Anesthesia Assessment:                           - Prior to the procedure, a History and Physical                            was performed, and patient medications and                            allergies were reviewed. The patient's tolerance of                            previous anesthesia was also reviewed. The risks                            and benefits of the procedure and the sedation                            options and risks were discussed with the patient.                            All questions were answered, and informed consent                            was obtained. Prior Anticoagulants: The patient has                            taken aspirin, last dose was 1 day prior to                            procedure. ASA Grade Assessment: III - A patient                            with severe systemic disease. After reviewing the                            risks and benefits, the patient was deemed in                            satisfactory condition to undergo the procedure.                           After obtaining informed consent, the endoscope was  passed under direct vision. Throughout the                            procedure, the patient's blood pressure, pulse, and                            oxygen saturations were monitored continuously. The                            Model GIF-HQ190 [id number]) scope was introduced                            through the mouth, and advanced to the second part                            of duodenum. The upper GI endoscopy  was                            accomplished without difficulty. The patient                            tolerated the procedure well. Scope In: Scope Out: Findings:      Esophagogastric landmarks were identified: the Z-line was found at 39       cm, the gastroesophageal junction was found at 39 cm and the site of       hiatal narrowing was found at 39 cm from the incisors.      A 1 cm hiatal hernia was present.      The exam of the esophagus was otherwise normal.      The entire examined stomach was normal. Biopsies were taken with a cold       forceps for Helicobacter pylori testing.      The duodenal bulb and second portion of the duodenum were normal. Complications:            No immediate complications. Estimated blood loss:                            Minimal. Estimated Blood Loss:     Estimated blood loss was minimal. Impression:               - Esophagogastric landmarks identified.                           - 1 cm hiatal hernia.                           - Normal stomach. Biopsied.                           - Normal duodenal bulb and second portion of the                            duodenum. Recommendation:           - Patient has a contact number available for  emergencies. The signs and symptoms of potential                            delayed complications were discussed with the                            patient. Return to normal activities tomorrow.                            Written discharge instructions were provided to the                            patient.                           - Resume previous diet.                           - Continue present medications.                           - Await pathology results.                           - I will discuss this case with Dr. Silverio Decamp in                            case bag dilation is considered for suspected                            achalasia, versus surgical intervention. No repeat                             upper endoscopy needed at this time Procedure Code(s):        --- Professional ---                           [phone], Esophagogastroduodenoscopy, flexible,                            transoral; with biopsy, single or multiple CPT copyright 2016 [employer]. All rights reserved. Remo Lipps P. Havery Moros, MD Carlota Raspberry. Macalister Arnaud, MD 03/06/2016 9:30:41 AM This report has been signed electronically. Number of Addenda: 2 Addendum Number: 1   Addendum Date: 03/06/2016 9:32:16 AM                            Remo Lipps P. Havery Moros, MD Carlota Raspberry. Phyllistine Domingos, MD 03/06/2016 9:32:20 AM This report has been signed electronically. Addendum Number: 2   Addendum Date: 03/06/2016 9:32:22 AM                            Remo Lipps P. Havery Moros, MD Carlota Raspberry. Carroll Ranney, MD 03/06/2016 9:32:27 AM This report has been signed electronically.

## 2016-03-06 NOTE — Patient Instructions (Signed)
YOU HAD AN ENDOSCOPIC PROCEDURE TODAY AT THE [facility]:   Refer to the procedure report that was given to you for any specific questions about what was found during the examination.  If the procedure report does not answer your questions, please call your gastroenterologist to clarify.  If you requested that your care partner not be given the details of your procedure findings, then the procedure report has been included in a sealed envelope for you to review at your convenience later.  YOU SHOULD EXPECT: Some feelings of bloating in the abdomen. Passage of more gas than usual.  Walking can help get rid of the air that was put into your GI tract during the procedure and reduce the bloating. If you had a lower endoscopy (such as a colonoscopy or flexible sigmoidoscopy) you may notice spotting of blood in your stool or on the toilet paper. If you underwent a bowel prep for your procedure, you may not have a normal bowel movement for a few days.  Please Note:  You might notice some irritation and congestion in your nose or some drainage.  This is from the oxygen used during your procedure.  There is no need for concern and it should clear up in a day or so.  SYMPTOMS TO REPORT IMMEDIATELY:    Following upper endoscopy (EGD)  Vomiting of blood or coffee ground material  New chest pain or pain under the shoulder blades  Painful or persistently difficult swallowing  New shortness of breath  Fever of 100F or higher  Black, tarry-looking stools  For urgent or emergent issues, a gastroenterologist can be reached at any hour by calling [phone].   DIET: Your first meal following the procedure should be a small meal and then it is ok to progress to your normal diet. Heavy or fried foods are harder to digest and may make you feel nauseous or bloated.  Likewise, meals heavy in dairy and vegetables can increase bloating.  Drink plenty of fluids but you should avoid alcoholic beverages  for 24 hours.  ACTIVITY:  You should plan to take it easy for the rest of today and you should NOT DRIVE or use heavy machinery until tomorrow (because of the sedation medicines used during the test).    FOLLOW UP: Our staff will call the number listed on your records the next business day following your procedure to check on you and address any questions or concerns that you may have regarding the information given to you following your procedure. If we do not reach you, we will leave a message.  However, if you are feeling well and you are not experiencing any problems, there is no need to return our call.  We will assume that you have returned to your regular daily activities without incident.  If any biopsies were taken you will be contacted by phone or by letter within the next 1-3 weeks.  Please call us at [phone] if you have not heard about the biopsies in 3 weeks.    SIGNATURES/CONFIDENTIALITY: You and/or your care partner have signed paperwork which will be entered into your electronic medical record.  These signatures attest to the fact that that the information above on your After Visit Summary has been reviewed and is understood.  Full responsibility of the confidentiality of this discharge information lies with you and/or your care-partner.  Thank you for letting us take care of your healthcare needs today. 

## 2016-03-07 ENCOUNTER — Telehealth: Payer: Self-pay | Admitting: *Deleted

## 2016-03-07 NOTE — Telephone Encounter (Signed)
  Follow up Call-  Call back number 03/06/2016  Post procedure Call Back phone  # [phone]  Permission to leave phone message Yes     Patient questions:  Do you have a fever, pain , or abdominal swelling? Yes.   Pain Score  2 *  Have you tolerated food without any problems? No.  Have you been able to return to your normal activities? Yes.    Do you have any questions about your discharge instructions: Diet   No. Medications  No. Follow up visit  No.  Do you have questions or concerns about your Care? No.  Actions: * If pain score is 4 or above: No action needed, pain <4.   FYI... Pt. States that she is still having difficulty swallowing, although, not any worse than pre procedure. She was barely able to swallow yesterday.  Trying oatmeal this AM.  Advised to call if Symptoms worsen.

## 2016-03-07 NOTE — Telephone Encounter (Signed)
This patient has chronic dysphagia likely due to achalasia. EGD showed no stenosis / stricture. She can try soft or liquid diet today and advance as tolerated. I am going to discuss her case with Dr. Silverio Decamp regarding possible dilation.

## 2016-03-13 ENCOUNTER — Telehealth: Payer: Self-pay | Admitting: Gastroenterology

## 2016-03-13 NOTE — Telephone Encounter (Signed)
See results note. 

## 2016-03-14 ENCOUNTER — Ambulatory Visit (INDEPENDENT_AMBULATORY_CARE_PROVIDER_SITE_OTHER): Payer: Medicare HMO | Admitting: Gynecology

## 2016-03-14 ENCOUNTER — Encounter: Payer: Self-pay | Admitting: Gynecology

## 2016-03-14 VITALS — BP 118/76

## 2016-03-14 DIAGNOSIS — N8189 Other female genital prolapse: Secondary | ICD-10-CM | POA: Diagnosis not present

## 2016-03-14 NOTE — Patient Instructions (Signed)
follow up in 3 months for your next pessary check

## 2016-03-14 NOTE — Progress Notes (Signed)
    [name] Sep 03, 1946 [id number]        70 y.o.  [id number] Presents for pessary check. History of cystocele/uterine prolapse using a ring pessary with good results. Notes occasional brown staining  Past medical history,surgical history, problem list, medications, allergies, family history and social history were all reviewed and documented in the EPIC chart.  Directed ROS with pertinent positives and negatives documented in the history of present illness/assessment and plan.  Exam: [name] assistant Filed Vitals:   03/14/16 1228  BP: 118/76   General appearance:  Normal Abdomen soft nontender without masses guarding rebound Pelvic external BUS vagina with atrophic changes. Ring pessary was removed. Slight irritation with atrophic changes on vaginal mucosa slight spotting. Cervix grossly normal. Uterus normal size midline mobile nontender. Adnexa without masses or tenderness  Ring pessary was removed, cleansed and replaced without difficulty  Assessment/Plan:  70 y.o. [name] with help relaxation doing well with ring pessary. Does have some irritation which I think is causing the brown staining from the vaginal mucosa.  Stable from her prior exam. Does not appear worsen off to bother with vaginal estrogen or to stop the pessary. Patient will follow up in 3 months for pessary recheck. Sooner if any issues.    [name] MD, 12:41 PM 03/14/2016

## 2016-03-27 DIAGNOSIS — K121 Other forms of stomatitis: Secondary | ICD-10-CM | POA: Diagnosis not present

## 2016-03-28 DIAGNOSIS — K115 Sialolithiasis: Secondary | ICD-10-CM | POA: Diagnosis not present

## 2016-03-29 ENCOUNTER — Ambulatory Visit: Payer: Medicare HMO | Admitting: Gastroenterology

## 2016-03-30 DIAGNOSIS — M3500 Sicca syndrome, unspecified: Secondary | ICD-10-CM | POA: Diagnosis not present

## 2016-03-30 DIAGNOSIS — M810 Age-related osteoporosis without current pathological fracture: Secondary | ICD-10-CM | POA: Diagnosis not present

## 2016-03-30 DIAGNOSIS — M150 Primary generalized (osteo)arthritis: Secondary | ICD-10-CM | POA: Diagnosis not present

## 2016-03-30 DIAGNOSIS — M3320 Polymyositis, organ involvement unspecified: Secondary | ICD-10-CM | POA: Diagnosis not present

## 2016-04-07 ENCOUNTER — Ambulatory Visit (INDEPENDENT_AMBULATORY_CARE_PROVIDER_SITE_OTHER): Payer: Medicare HMO | Admitting: Gastroenterology

## 2016-04-07 ENCOUNTER — Encounter: Payer: Self-pay | Admitting: Gastroenterology

## 2016-04-07 VITALS — BP 112/70 | HR 73 | Ht 58.5 in | Wt 113.0 lb

## 2016-04-07 DIAGNOSIS — R1314 Dysphagia, pharyngoesophageal phase: Secondary | ICD-10-CM

## 2016-04-07 NOTE — Patient Instructions (Signed)
Contact the office after you have completed your back surgery and are ready to be referred to ENT and Speech Therapy Follow up as needed with Dr Havery Moros

## 2016-04-10 DIAGNOSIS — M4120 Other idiopathic scoliosis, site unspecified: Secondary | ICD-10-CM | POA: Diagnosis not present

## 2016-04-10 DIAGNOSIS — I10 Essential (primary) hypertension: Secondary | ICD-10-CM | POA: Diagnosis not present

## 2016-04-10 DIAGNOSIS — M4135 Thoracogenic scoliosis, thoracolumbar region: Secondary | ICD-10-CM | POA: Diagnosis not present

## 2016-04-10 DIAGNOSIS — M4806 Spinal stenosis, lumbar region: Secondary | ICD-10-CM | POA: Diagnosis not present

## 2016-04-10 NOTE — Progress Notes (Signed)
[name]    [id number]    [birth year]  Primary Care Physician:PHARR,WALTER DAVIDSON, MD  Referring Physician: Deland Pretty, MD [place], [place] [address]  Chief complaint: Dysphagia HPI: 70 year old female with rheumatoid arthritis, polymyositis and Sjogren's with chronic dysphagia followed by Dr. 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 Patient has trouble swallowing solids or take pured diet, she feels food is usually stuck in the back of throat and once she is able to clear that she does not feel it getting stuck anywhere else. She had repair of Zenker's diverticulum few years ago. She cuts all her pills up and is able to manage and keep up with her nutritional status by modifying her diet.    Outpatient Encounter Prescriptions as of 04/07/2016  Medication Sig  . Calcium-Vitamin D-Vitamin K (VIACTIV PO) Take by mouth daily.  . diclofenac (VOLTAREN) 75 MG EC tablet Take 75 mg by mouth 2 (two) times daily.  Marland Kitchen [place] by mouth daily.  . hydroxychloroquine (PLAQUENIL) 200 MG tablet Take 200 mg by mouth daily.  . magic mouthwash SOLN Take 10 mLs by mouth 3 (three) times daily as needed for mouth pain. Reported on 03/14/2016  . NYSTATIN PO Take by mouth. Nystatin mouthwash; uses 3 times daily  . omeprazole (PRILOSEC) 20 MG capsule Take 1 capsule (20 mg total) by mouth daily. Take one 30 min prior to breakfast. (Patient taking differently: Take 20 mg by mouth 2 (two) times daily before a meal. Take one 30 min prior to breakfast.)  . predniSONE (DELTASONE) 5 MG tablet Take 5 mg by mouth daily with breakfast.  . Tetrahydrozoline HCl (VISINE OP) Place 1 drop into both eyes daily as needed (dry eyes).  . timolol (BETIMOL) 0.5 % ophthalmic solution Place 1 drop into both eyes at bedtime.  . traMADol  (ULTRAM) 50 MG tablet Take 50 mg by mouth daily as needed for moderate pain.   Marland Kitchen triamterene-hydrochlorothiazide (MAXZIDE-25) 37.5-25 MG tablet Take 0.5 tablets by mouth daily.  . Vitamin D, Cholecalciferol, 1000 units TABS Take by mouth. Reported on 03/06/2016   No facility-administered encounter medications on file as of 04/07/2016.    Allergies as of 04/07/2016 - Review Complete 03/14/2016  Allergen Reaction Noted  . Phenylephrine  03/03/2016    Past Medical History  Diagnosis Date  . HTN (hypertension)   . Glaucoma   . Sjogren's syndrome ([place])   . Raynaud phenomenon   . Polymyositis ([place])   . GERD (gastroesophageal reflux disease)   . Swallowing difficulty     unable to swallow whole pills  . AAA (abdominal aortic aneurysm) ([place])     small      report in epic 11/11 states no aaa    . Iron deficiency anemia     hx  . Family history of anesthesia complication     "sister has severe sleep apnea; she has alot of trouble w/anesthesia" (09/08/2013)  . High cholesterol     "not severe; can't take the medicine" (09/08/2013)  . Multiple thyroid nodules   . Migraines     "hurt like a migraine; don't get them like I did when I was younger" (09/08/2013)  . OA (osteoarthritis)   . Chronic lower back pain     "for 6 years; relieved since back OR 07/2013" (09/08/2013)  .  Swelling of left lower extremity   . Pancreatitis   . Diverticulosis   . Shingles   . Osteoporosis     Past Surgical History  Procedure Laterality Date  . Cataract extraction w/ intraocular lens  implant, bilateral Bilateral 12/2010-01/2011  . Esophageal dilation      "a few times before 02/2010; not since" (09/08/2013)  . Repair zenker's diverticula  02/2010    "diverticulectomy w/zenker's pouch correction" (09/08/2013)  . Dental surgery  11/2011-12/2011    one on each side in my bottom jaw; dental implants" (09/08/2013)  . Lumbar fusion  Aug 14, 2013    Dr. Consuello Masse  . Esophageal manometry N/A 02/25/2016    Procedure:  ESOPHAGEAL MANOMETRY (EM);  Surgeon: Manus Gunning, MD;  Location: [facility];  Service: Gastroenterology;  Laterality: N/A;    Family History  Problem Relation Age of Onset  . Ovarian cancer Mother   . Hypertension Father   . Heart disease Father   . Breast cancer Father     Age 54  . Colon cancer Father 36  . Psoriasis Sister   . Sarcoidosis Sister   . Arthritis Sister     psoriatic arthritis  . Diabetes Sister   . Arthritis Maternal Uncle     rheumatiod arthritis  . Diabetes Son   . Heart disease Brother     Social History   Social History  . Marital Status: Widowed    Spouse Name: N/A  . Number of Children: N/A  . Years of Education: N/A   Occupational History  . Not on file.   Social History Main Topics  . Smoking status: Never Smoker   . Smokeless tobacco: Never Used  . Alcohol Use: No  . Drug Use: No  . Sexual Activity: No     Comment: 1st intercourse 12 yo-1 partner   Other Topics Concern  . Not on file   Social History Narrative      Review of systems: Review of Systems  Constitutional: Negative for fever and chills.  HENT: Negative.   Eyes: Negative for blurred vision.  Respiratory: Negative for cough, shortness of breath and wheezing.   Cardiovascular: Negative for chest pain and palpitations.  Gastrointestinal: as per HPI Genitourinary: Negative for dysuria, urgency, frequency and hematuria.  Musculoskeletal: Negative for myalgias, back pain and joint pain.  Skin: Negative for itching and rash.  Neurological: Negative for dizziness, tremors, focal weakness, seizures and loss of consciousness.  Endo/Heme/Allergies: Negative for environmental allergies.  Psychiatric/Behavioral: Negative for depression, suicidal ideas and hallucinations.  All other systems reviewed and are negative.   Physical Exam: Filed Vitals:   04/07/16 1458  BP: 112/70  Pulse: 73   Gen:      No acute distress, kyphoscoliosis, walks with walker HEENT:   EOMI, sclera anicteric Neck:     No masses; no thyromegaly Lungs:    Clear to auscultation bilaterally; normal respiratory effort CV:         Regular rate and rhythm; no murmurs Abd:      + bowel sounds; soft, non-tender; no palpable masses, no distension Ext:    No edema; adequate peripheral perfusion Skin:      Warm and dry; no rash Neuro: alert and oriented x 3 Psych: normal mood and affect  Data Reviewed:  Reviewed chart in epic on and also all pertinent GI workup   Assessment and Plan/Recommendations:  70 year old female with multiple autoimmune disorders, rheumatoid arthritis, polymyositis and Sjogren's with chronic dysphagia here for  follow-up visit.  Reviewed the esophageal manometry study, it was limited due to inability to initiate swallow by patient. She did have 2 esophageal contractions with normal peristaltic pattern which will exclude diagnosis of achalasia. Given she did not have sufficient number of swallows, cannot completely exclude minor esophageal dysmotility or possible EG junction outflow obstruction though seems unlikely based on the barium swallow which showed good clearance of barium through the EG junction. Discussed the above results in detail with patient and she was relieved to know that she does not have achalasia  Patient has mostly oropharyngeal dysphagia and will benefit from continued speech and swallow therapy and may also consider ENT referral to see if they have any other options or therapies available to improve oropharyngeal dysphagia She is planning to undergo back surgery in 2 weeks and prefers to hold off ENT referral and speech therapy until she recovers from her surgery. She will follow up with Dr. Havery Moros after her recovery from back surgery in next 3-6 months  K. Denzil Magnuson , MD [phone] Mon-Fri 8a-5p [phone] after 5p, weekends, holidays

## 2016-04-12 ENCOUNTER — Other Ambulatory Visit: Payer: Self-pay | Admitting: Neurosurgery

## 2016-04-12 ENCOUNTER — Telehealth: Payer: Self-pay | Admitting: Vascular Surgery

## 2016-04-12 NOTE — Telephone Encounter (Signed)
-----   Message from Denman Mcquire, RN sent at 04/12/2016  9:11 AM EDT ----- Regarding: New pt. consult with Dr. Donnetta Hutching Please schedule for office consult with Dr. Donnetta Hutching, prior to ALIF scheduled on 06/02/16.  Please remind the pt. to bring copy of LS spine films to appt.

## 2016-04-12 NOTE — Telephone Encounter (Signed)
Spoke to pt to sch appt for 5/30 at 11.

## 2016-04-13 ENCOUNTER — Inpatient Hospital Stay (HOSPITAL_COMMUNITY): Admission: RE | Admit: 2016-04-13 | Payer: Medicare HMO | Source: Ambulatory Visit

## 2016-04-20 ENCOUNTER — Inpatient Hospital Stay (HOSPITAL_COMMUNITY): Admission: RE | Admit: 2016-04-20 | Payer: Medicare HMO | Source: Ambulatory Visit | Admitting: Neurosurgery

## 2016-04-20 ENCOUNTER — Encounter (HOSPITAL_COMMUNITY): Admission: RE | Payer: Self-pay | Source: Ambulatory Visit

## 2016-04-20 SURGERY — ANTERIOR LATERAL LUMBAR FUSION 2 LEVELS
Anesthesia: General

## 2016-04-21 ENCOUNTER — Other Ambulatory Visit: Payer: Self-pay

## 2016-04-27 ENCOUNTER — Ambulatory Visit: Payer: Medicare HMO | Admitting: Gynecology

## 2016-05-10 ENCOUNTER — Encounter: Payer: Self-pay | Admitting: Vascular Surgery

## 2016-05-10 DIAGNOSIS — M19072 Primary osteoarthritis, left ankle and foot: Secondary | ICD-10-CM | POA: Diagnosis not present

## 2016-05-10 DIAGNOSIS — M170 Bilateral primary osteoarthritis of knee: Secondary | ICD-10-CM | POA: Diagnosis not present

## 2016-05-10 DIAGNOSIS — M19071 Primary osteoarthritis, right ankle and foot: Secondary | ICD-10-CM | POA: Diagnosis not present

## 2016-05-16 ENCOUNTER — Encounter: Payer: Self-pay | Admitting: Vascular Surgery

## 2016-05-16 ENCOUNTER — Ambulatory Visit (INDEPENDENT_AMBULATORY_CARE_PROVIDER_SITE_OTHER): Payer: Medicare HMO | Admitting: Vascular Surgery

## 2016-05-16 VITALS — BP 139/75 | HR 61 | Ht 58.5 in | Wt 114.2 lb

## 2016-05-16 DIAGNOSIS — M5137 Other intervertebral disc degeneration, lumbosacral region: Secondary | ICD-10-CM | POA: Diagnosis not present

## 2016-05-16 NOTE — Progress Notes (Signed)
Patient name: [name] MRN: [id number] DOB: 13-Aug-1946 Sex: female   Referred by: Vertell Limber  Reason for referral:  Chief Complaint  Patient presents with  . New Evaluation    ALIF consult - Dr. Vertell Limber     HISTORY OF PRESENT ILLNESS: 70 year old female seen today for discussion of anterior exposure for L5-S1 disc surgery. She's had prior posterior repair and has indication for anterior exposure. She also has further plan surgery for potentially lateral and posterior aspect as well. She is here today for discussion of my role in the anterior exposure. She has had no prior abdominal surgery. No history of cardiac disease and no history of peripheral vascular occlusive disease. She has multiple medical problems. She has analyzed weakness related to polymyositis. Is on chronic steroids for this and rheumatoid arthritis. Does walk with a walker related to her rheumatologic issues and her back disc disease and scoliosis.  Past Medical History  Diagnosis Date  . HTN (hypertension)   . Glaucoma   . Sjogren's syndrome ([place])   . Raynaud phenomenon   . Polymyositis ([place])   . GERD (gastroesophageal reflux disease)   . Swallowing difficulty     unable to swallow whole pills  . AAA (abdominal aortic aneurysm) ([place])     small      report in epic 11/11 states no aaa    . Iron deficiency anemia     hx  . Family history of anesthesia complication     "sister has severe sleep apnea; she has alot of trouble w/anesthesia" (09/08/2013)  . High cholesterol     "not severe; can't take the medicine" (09/08/2013)  . Multiple thyroid nodules   . Migraines     "hurt like a migraine; don't get them like I did when I was younger" (09/08/2013)  . OA (osteoarthritis)   . Chronic lower back pain     "for 6 years; relieved since back OR 07/2013" (09/08/2013)  . Swelling of left lower extremity   . Pancreatitis   . Diverticulosis   . Shingles   . Osteoporosis     Past Surgical History  Procedure  Laterality Date  . Cataract extraction w/ intraocular lens  implant, bilateral Bilateral 12/2010-01/2011  . Esophageal dilation      "a few times before 02/2010; not since" (09/08/2013)  . Repair zenker's diverticula  02/2010    "diverticulectomy w/zenker's pouch correction" (09/08/2013)  . Dental surgery  11/2011-12/2011    one on each side in my bottom jaw; dental implants" (09/08/2013)  . Lumbar fusion  Aug 14, 2013    Dr. Consuello Masse  . Esophageal manometry N/A 02/25/2016    Procedure: ESOPHAGEAL MANOMETRY (EM);  Surgeon: Manus Gunning, MD;  Location: [facility];  Service: Gastroenterology;  Laterality: N/A;    Social History   Social History  . Marital Status: Widowed    Spouse Name: N/A  . Number of Children: N/A  . Years of Education: N/A   Occupational History  . Not on file.   Social History Main Topics  . Smoking status: Never Smoker   . Smokeless tobacco: Never Used  . Alcohol Use: No  . Drug Use: No  . Sexual Activity: No     Comment: 1st intercourse 12 yo-1 partner   Other Topics Concern  . Not on file   Social History Narrative    Family History  Problem Relation Age of Onset  . Ovarian cancer Mother   . Hypertension Father   .  Heart disease Father   . Breast cancer Father     Age 7  . Colon cancer Father 40  . Psoriasis Sister   . Sarcoidosis Sister   . Arthritis Sister     psoriatic arthritis  . Diabetes Sister   . Arthritis Maternal Uncle     rheumatiod arthritis  . Diabetes Son   . Heart disease Brother     Allergies as of 05/16/2016 - Review Complete 05/16/2016  Allergen Reaction Noted  . Phenylephrine  03/03/2016    Current Outpatient Prescriptions on File Prior to Visit  Medication Sig Dispense Refill  . diclofenac (VOLTAREN) 75 MG EC tablet Take 75 mg by mouth 2 (two) times daily.    Marland Kitchen FIBER SELECT GUMMIES CHEW Chew 2 each by mouth daily.     . hydroxychloroquine (PLAQUENIL) 200 MG tablet Take 200 mg by mouth daily. Reported on  05/16/2016    . omeprazole (PRILOSEC) 20 MG capsule Take 1 capsule (20 mg total) by mouth daily. Take one 30 min prior to breakfast. 30 capsule 3  . predniSONE (DELTASONE) 5 MG tablet Take 5 mg by mouth daily with breakfast.    . timolol (BETIMOL) 0.5 % ophthalmic solution Place 1 drop into both eyes at bedtime.    . traMADol (ULTRAM) 50 MG tablet Take 50 mg by mouth See admin instructions. Pt takes 1 daily in the morning, and may take another dose later in the day as needed    . acetaminophen (TYLENOL) 160 MG/5ML elixir Take 500 mg by mouth every 4 (four) hours as needed for fever. Reported on 05/16/2016    . Calcium-Vitamin D-Vitamin K (VIACTIV PO) Take 2 each by mouth daily. Reported on 05/16/2016    . Cholecalciferol (VITAMIN D3) 5000 UNIT/ML LIQD Take 5,000 Units by mouth daily. Reported on 05/16/2016    . magnesium hydroxide (MILK OF MAGNESIA) 400 MG/5ML suspension Take 15 mLs by mouth daily as needed for mild constipation or heartburn. Reported on 05/16/2016    . methylPREDNISolone sodium succinate (SOLU-MEDROL) 1000 MG injection Inject 1,000 mg into the vein once. Reported on 05/16/2016    . Tetrahydrozoline HCl (VISINE OP) Place 1 drop into both eyes daily as needed (dry eyes). Reported on 05/16/2016    . triamterene-hydrochlorothiazide (MAXZIDE-25) 37.5-25 MG tablet Take 0.5 tablets by mouth daily. Reported on 05/16/2016  0   No current facility-administered medications on file prior to visit.     REVIEW OF SYSTEMS:  Positives indicated with an "X"  CARDIOVASCULAR:  [ ]  chest pain   [ ]  chest pressure   [ ]  palpitations   [ ]  orthopnea   [ ]  dyspnea on exertion   [ ]  claudication   [ ]  rest pain   [ ]  DVT   [ ]  phlebitis PULMONARY:   [ ]  productive cough   [ ]  asthma   [ ]  wheezing NEUROLOGIC:   [ ]  weakness  [ ]  paresthesias  [ ]  aphasia  [ ]  amaurosis  [ ]  dizziness HEMATOLOGIC:   [ ]  bleeding problems   [ ]  clotting disorders MUSCULOSKELETAL:  [ ]  joint pain   [ ]  joint  swelling GASTROINTESTINAL: [ ]   blood in stool  [ ]   hematemesis GENITOURINARY:  [ ]   dysuria  [ ]   hematuria PSYCHIATRIC:  [ ]  history of major depression INTEGUMENTARY:  [ ]  rashes  [ ]  ulcers CONSTITUTIONAL:  [ ]  fever   [ ]  chills  PHYSICAL EXAMINATION:  General: The  patient is a well-nourished female, in no acute distress.Requires a walker for ambulation Vital signs are BP 139/75 mmHg  Pulse 61  Ht 4' 10.5" (1.486 m)  Wt 114 lb 3.2 oz (51.801 kg)  BMI 23.46 kg/m2  SpO2 97% Pulmonary: There is a good air exchange bilaterally without wheezing or rales. Abdomen: Soft and non-tender with normal pitch bowel sounds. No surgical incisions Musculoskeletal: There are no major deformities.  There is no significant extremity pain. Neurologic: No focal weakness or paresthesias are detected, Skin: There are no ulcer or rashes noted. Psychiatric: The patient has normal affect. Cardiovascular: There is a regular rate and rhythm without significant murmur appreciated. Carotid arteries without bruits bilaterally. 2+ radial, femoral and dorsalis pedis pulses bilaterally  I do have a CT scan from several years ago showing no evidence of severe atherosclerotic change to her aorta and severe calcification. No recent plain films. Has had the MRI.  Impression and Plan:  I discussed my role for anterior exposure. Explained to Dr. Vertell Limber has determined that her best option would be surgical correction for her spine issue and that the anterior approach as part of the surgical plan. Explain the incision would be from below the umbilicus left lateral and would mobilize the rectus muscle, anterior peritoneal contents, left ureter and arterial and venous structures overlying the spine. Also explained the potential for injury to all of these. I do not feel she has any strong contraindication to surgery. Obviously the biggest concern is her frail state with this magnitude of surgery. She understands and wished to  proceed as scheduled.    Curt Jews [facility] Office: [phone]

## 2016-05-19 ENCOUNTER — Other Ambulatory Visit: Payer: Self-pay | Admitting: Gynecology

## 2016-05-19 DIAGNOSIS — Z1231 Encounter for screening mammogram for malignant neoplasm of breast: Secondary | ICD-10-CM

## 2016-05-23 NOTE — Pre-Procedure Instructions (Signed)
    [name]  05/23/2016      [facility], [place] [address] Phone: [phone] Fax: [place], [place] [address] [place] [address] Phone: [phone] Fax: [phone]  [facility] [place] [place] [address] Phone: [phone] Fax: [phone]    Your procedure is scheduled on Friday, June 02, 2016   .  Report to [facility] Admitting at 5:30 A.M.   Call this number if you have problems the morning of surgery:  [phone]   Remember:  Do not eat food or drink liquids after midnight.   Take these medicines the morning of surgery with A SIP OF WATER: omeprazole (prilosec), prednisone (deltasone), tramadol (ultram), tylenol if needed, guaifenesin (mucinex) if needed, hydrocodone-acetaminophen (HYCET) if needed, tetrahydrozoline (VISINE) if needed   STOP taking these medicines 7 days prior to surgery: Diclofenac (Voltaren), Aspirin, NSAIDS, ibuprofen, aleve, motrin, advil, naproxen, herbal supplements, vitamins (Viactiv PO, Vitamin D3)   Do not wear jewelry, make-up or nail polish.  Do not wear lotions, powders, or perfumes.  You may wear deodorant.  Do not shave 48 hours prior to surgery.  Men may shave face and neck.  Do not bring valuables to the hospital.  [facility] is not responsible for any belongings or valuables.  Contacts, dentures or bridgework may not be worn into surgery.  Leave your suitcase in the car.  After surgery it may be brought to your room.  For patients admitted to the hospital, discharge time will be determined by your treatment team.  Patients discharged the day of surgery will not be allowed to drive home.    Please read over the following fact sheets that you were given. Pain  Booklet, Coughing and Deep Breathing, MRSA Information and Surgical Site Infection Prevention

## 2016-05-24 ENCOUNTER — Encounter (HOSPITAL_COMMUNITY)
Admission: RE | Admit: 2016-05-24 | Discharge: 2016-05-24 | Disposition: A | Discharge status: Home / Self Care | Payer: Medicare HMO | Source: Ambulatory Visit | Attending: Neurosurgery | Admitting: Neurosurgery

## 2016-05-24 ENCOUNTER — Encounter (HOSPITAL_COMMUNITY): Payer: Self-pay

## 2016-05-24 DIAGNOSIS — E785 Hyperlipidemia, unspecified: Secondary | ICD-10-CM | POA: Diagnosis not present

## 2016-05-24 DIAGNOSIS — M4135 Thoracogenic scoliosis, thoracolumbar region: Secondary | ICD-10-CM | POA: Diagnosis not present

## 2016-05-24 DIAGNOSIS — K219 Gastro-esophageal reflux disease without esophagitis: Secondary | ICD-10-CM | POA: Insufficient documentation

## 2016-05-24 DIAGNOSIS — Z7952 Long term (current) use of systemic steroids: Secondary | ICD-10-CM | POA: Diagnosis not present

## 2016-05-24 DIAGNOSIS — M3500 Sicca syndrome, unspecified: Secondary | ICD-10-CM | POA: Diagnosis not present

## 2016-05-24 DIAGNOSIS — Z79899 Other long term (current) drug therapy: Secondary | ICD-10-CM | POA: Insufficient documentation

## 2016-05-24 DIAGNOSIS — Z0183 Encounter for blood typing: Secondary | ICD-10-CM | POA: Diagnosis not present

## 2016-05-24 DIAGNOSIS — Z01812 Encounter for preprocedural laboratory examination: Secondary | ICD-10-CM | POA: Diagnosis not present

## 2016-05-24 DIAGNOSIS — M069 Rheumatoid arthritis, unspecified: Secondary | ICD-10-CM | POA: Diagnosis not present

## 2016-05-24 DIAGNOSIS — H409 Unspecified glaucoma: Secondary | ICD-10-CM | POA: Insufficient documentation

## 2016-05-24 DIAGNOSIS — I10 Essential (primary) hypertension: Secondary | ICD-10-CM | POA: Insufficient documentation

## 2016-05-24 DIAGNOSIS — Z01818 Encounter for other preprocedural examination: Secondary | ICD-10-CM | POA: Diagnosis not present

## 2016-05-24 DIAGNOSIS — M4806 Spinal stenosis, lumbar region: Secondary | ICD-10-CM | POA: Diagnosis not present

## 2016-05-24 HISTORY — DX: Failed or difficult intubation, initial encounter: T88.4XXA

## 2016-05-24 HISTORY — DX: Other postherpetic nervous system involvement: B02.29

## 2016-05-24 HISTORY — DX: Personal history of other diseases of the digestive system: Z87.19

## 2016-05-24 HISTORY — DX: Acute rheumatic endocarditis: I01.1

## 2016-05-24 LAB — BASIC METABOLIC PANEL WITH GFR
Anion gap: 8 (ref 5–15)
BUN: 15 mg/dL (ref 6–20)
CO2: 26 mmol/L (ref 22–32)
Calcium: 9.8 mg/dL (ref 8.9–10.3)
Chloride: 93 mmol/L — ABNORMAL LOW (ref 101–111)
Creatinine, Ser: 0.64 mg/dL (ref 0.44–1.00)
GFR calc Af Amer: >60 mL/min
GFR calc non Af Amer: >60 mL/min
Glucose, Bld: 83 mg/dL (ref 65–99)
Potassium: 4.7 mmol/L (ref 3.5–5.1)
Sodium: 127 mmol/L — ABNORMAL LOW (ref 135–145)

## 2016-05-24 LAB — CBC
HEMATOCRIT: 31.4 % — AB (ref 36.0–46.0)
HEMOGLOBIN: 10.3 g/dL — AB (ref 12.0–15.0)
MCH: 27 pg (ref 26.0–34.0)
MCHC: 32.8 g/dL (ref 30.0–36.0)
MCV: 82.2 fL (ref 78.0–100.0)
Platelets: 326 10*3/uL (ref 150–400)
RBC: 3.82 MIL/uL — ABNORMAL LOW (ref 3.87–5.11)
RDW: 13.6 % (ref 11.5–15.5)
WBC: 5.8 10*3/uL (ref 4.0–10.5)

## 2016-05-24 LAB — SURGICAL PCR SCREEN
MRSA, PCR: NEGATIVE
Staphylococcus aureus: POSITIVE — AB

## 2016-05-24 NOTE — Progress Notes (Signed)
Pt MRSA PCR negative, MSSA positive. Patient notified by phone and prescription called to pharmacy.

## 2016-05-24 NOTE — Progress Notes (Signed)
PCP: Deland Pretty Cardiologist- no current cardiologist, last visit with Dr. Gwenlyn Found 10/15/13 Pt denies Echo or cath, states she had a normal stress test "many years ago" Chest Xray: 08/08/13 EKG: 10/27/15  Pt states she has chronic dysphagia and takes her medicines as liquids or crushed  Pt states history of RAYNAUD'S PHENOMENON  Pt also states she has a prescription for SOLU-MEDROL one time dose to be given day of surgery in hospital. Note made on chart and pt made aware to bring prescription to hospital day of surgery.

## 2016-05-25 NOTE — Progress Notes (Addendum)
Anesthesia Chart Review:  Pt is a 69 year old female scheduled for L1-2, L5-S1 anterior lumbar interbody fusion, T10-pelvis pedicle screws/rods bilaterally with exploration of fusion at L2-5 with AIRO, abdominal exposure on 06/02/2016 with Dr. Vertell Limber and Dr. Donnetta Hutching.   PMH includes:  HTN, AAA (no aneurysm on [place] 11/02/10 and 09/08/13), hyperlipidemia, Raynaud's, iron deficiency anemia, multiple thyroid nodules, glaucoma, Sjogren's syndrome, polymyositis, RA, GERD. S/p PLIF 08/14/13.   Pt reports she is difficult to intubate. Records from 08/14/13 surgery show grade I view, 7.29mm ETT inserted under direct vision x1 attempt.   Medications include: hydroxychloroquine, prilosec, prednisone, timolol, triamterene-hctz.   Preoperative labs reviewed.  Na 127, Cl 93. Left message for Janett Billow in Dr. Melven Sartorius office. Pt will need hyponatremia evaluated prior to surgery.   EKG 10/27/15: Sinus rhythm. Borderline right axis deviation. Nonspecific T abnormalities, lateral leads  If labs acceptable DOS, I anticipate pt can proceed as scheduled.   Willeen Cass, FNP-BC [facility]/Anesthesiology Phone: [phone] 05/25/2016 4:47 PM  Addendum:   Pt saw PCP Dr. Deland Pretty on 05/30/16 for eval of hyponatremia. Apparently this is a chronic problem for pt. Na 6/13 was 125, Cl was 90. Dr. Shelia Media felt her chronic hyponatremia was not a barrier to surgery but suggested anesthesia may wish to gently hydrate her with normal saline prior to surgery. I will have Na rechecked DOS.   If no changes, I anticipate pt can proceed with surgery as scheduled.   Willeen Cass, FNP-BC [facility]/Anesthesiology Phone: [phone] 06/01/2016 11:16 AM

## 2016-05-26 DIAGNOSIS — I781 Nevus, non-neoplastic: Secondary | ICD-10-CM | POA: Diagnosis not present

## 2016-05-26 DIAGNOSIS — L258 Unspecified contact dermatitis due to other agents: Secondary | ICD-10-CM | POA: Diagnosis not present

## 2016-05-29 DIAGNOSIS — E871 Hypo-osmolality and hyponatremia: Secondary | ICD-10-CM | POA: Diagnosis not present

## 2016-05-30 DIAGNOSIS — Z01818 Encounter for other preprocedural examination: Secondary | ICD-10-CM | POA: Diagnosis not present

## 2016-05-30 DIAGNOSIS — E871 Hypo-osmolality and hyponatremia: Secondary | ICD-10-CM | POA: Diagnosis not present

## 2016-05-31 NOTE — H&P (Signed)
Patient ID:   [id number] Patient: [name]  Date of Birth: Oct 15, 1946 Visit Type: Office Visit   Date: 04/10/2016 09:30 AM Provider: Marchia Meiers. Vertell Limber MD   This 70 year old female presents for back pain.  History of Present Illness: 1.  back pain    Patient here to discuss surgical plans after discussing her case with Dr. Randel Pigg.  Kwon's Advice:  ALIF at L5-S1, with ALIF implant towards the patient's left side. Intra-operatively, Randel Pigg suggested you can use the pedicle screws at L5 to gauge whether you are able to correct fractional curve the patient has. (If the ALIF graft makes the L5 pedicle screws look level on intra-op fluoro shot, you may have corrected the fractional curve and Randel Pigg said he would proceed with an XLIF at the virgin level on top of the construct). Conversely, if the ALIF graft does not correct the patient's fractional curve, Randel Pigg would avoid doing an XLIF at all, fearing the XLIF may increase the fractional curve. Posterior revision of hardware and T10- pelvis to try and correct the patients coronal imbalance.  I reviewed Dr. Marlou Sa input with the patient and went over her imaging with her in greater detail today.  I explained that I do not think a smaller surgical intervention is going to be terribly helpful and that if we are going to proceed with surgery should be a more involved surgery as he has described.She will need ALIF performed vascular surgeon and I have asked her to bring her records regarding abdominal aortic aneurysm she was told that she had had that appointment for him to review.  Patient states she was evaluated for abdominal aneurysm, there were no complications found during that time and in fact after more definitive testing it appears that there was no aneurysm.  Currently taking prednisone.  Physical Exam:  Hip flexor remains weak on the right.    The MRI demonstrates right foraminal disc protrusion and foraminal stenosis along with  transitional scoliotic curvature above prior fusion with severe focal scoliosis at this level.  This is causing significant nerve compression and is accountable for her symptoms.      Medical/Surgical/Interim History Reviewed, no change.  Last detailed document date:02/16/2014.   PAST MEDICAL HISTORY, SURGICAL HISTORY, FAMILY HISTORY, SOCIAL HISTORY AND REVIEW OF SYSTEMS  11/22/2015, which I have signed.  Family History: Reviewed, no changes.  Last detailed document: 02/16/2014.   Social History: Tobacco use reviewed. Reviewed, no changes. Last detailed document date: 02/16/2014.      MEDICATIONS(added, continued or stopped this visit): Started Medication Directions Instruction Stopped   aspirin 81 mg tablet,delayed release take 1 tablet by oral route  every day     diclofenac Take once daily     Maxzide-25mg  37.5 mg-25 mg tablet take 0.5 tablet by oral route  every day     prednisone 5 mg tablet take 1 tablet by oral route  every day     timolol maleate 0.5 % eye drops instill 1 drop in each eye nightly     tramadol 50 mg tablet take 1 tablet by oral route  every 6 hours as needed     Xiidra 5 % eye drops in a dropperette instill 1 drop by ophthalmic route 2 times every day into both eyes approximately 12 hours apart       ALLERGIES: Ingredient Reaction Medication Name Comment  NO KNOWN ALLERGIES     No known allergies. Reviewed, no changes.    Vitals Date Temp F  BP Pulse Ht In Wt Lb BMI BSA Pain Score  04/10/2016  150/79 69 58 114 23.83  6/10      IMPRESSION We discussed at length the current plans for surgery per recommendations from [place] as stated above. The patient is agreeable to this. We will proceed with ALIF at L5-S1, posterior revision of hardware to T10-pelvis and if appropriate XLIF. The patient was evaluated for abdominal aneurysm in the past but was found, at that time, to not be concerning. I advised the patient to get the records from this evaluation  in preparation for her upcoming vascular surgery consultation.   Completed Orders (this encounter) Order Details Reason Side Interpretation Result Initial Treatment Date Region  Lifestyle education Patient will follow up with Primary care physician.         Assessment/Plan # Detail Type Description   1. Assessment Essential (primary) hypertension (I10).         Pain Assessment/Treatment Pain Scale: 6/10. Method: Numeric Pain Intensity Scale. Location: back. Onset: 11/21/2014. Duration: varies. Quality: discomforting. Pain Assessment/Treatment follow-up plan of care: Patient will continue medication management..  Fall Risk Plan The patient has not fallen in the last year.  Schedule consultation with vascular surgery. Schedule for surgery. Nurse education given today. patient was fitted for TLSO and we went over the surgery in great detail with her.  Orders: Instruction(s)/Education: Assessment Instruction  I10 Lifestyle education             Provider:  Marchia Meiers. Vertell Limber MD  04/10/2016 10:46 AM Dictation edited by: Marchia Meiers. Vertell Limber    CC Providers: Deland Pretty [facility] [address] [place],  [place]  [address]-   Anna Voytek [facility] [address] [place], [place] [address]              Electronically signed by Marchia Meiers. Vertell Limber MD on 04/10/2016 11:41 AM  Patient ID:   [id number] Patient: [name]  Date of Birth: Jun 13, 1946 Visit Type: Office Visit   Date: 02/18/2016 11:45 AM Provider: Marchia Meiers. Vertell Limber MD   This 70 year old female presents for back pain.  History of Present Illness: 1.  back pain    Patient returns to review MRI  The MRI demonstrates right foraminal disc protrusion and foraminal stenosis along with transitional scoliotic curvature above prior fusion with severe focal scoliosis at this level.  This is causing significant nerve compression and is  accountable for her symptome.  An XLIF may help relieve her symptoms and associated weakness, but I am concerned that spinal balance issues may not be fully addressed with focal surgery.  I will review her imaging with Dr. Randel Pigg to get his input regarding optimal management strategies for her complex spinal deformity.  Patient has gone from reluctance for having surgery to desiring to do so.  She is frustrated with her inability to function and feels at this paoint that she needs to do something.      Medical/Surgical/Interim History Reviewed, no change.  Last detailed document date:02/16/2014.   PAST MEDICAL HISTORY, SURGICAL HISTORY, FAMILY HISTORY, SOCIAL HISTORY AND REVIEW OF SYSTEMS I have reviewed the patient's past medical, surgical, family and social history as well as the comprehensive review of systems as included on the [facility] history form dated 11/22/2015, which I have signed.  Family History: Reviewed, no changes.  Last detailed document: 02/16/2014.   Social History: Tobacco use reviewed. Reviewed, no changes. Last  detailed document date: 02/16/2014.      MEDICATIONS(added, continued or stopped this visit): Started Medication Directions Instruction Stopped   aspirin 81 mg tablet,delayed release take 1 tablet by oral route  every day     diclofenac Take once daily     Maxzide-25mg  37.5 mg-25 mg tablet take 0.5 tablet by oral route  every day     prednisone 5 mg tablet take 1 tablet by oral route  every day     timolol maleate 0.5 % eye drops instill 1 drop in each eye nightly     tramadol 50 mg tablet take 1 tablet by oral route  every 6 hours as needed     Xiidra 5 % eye drops in a dropperette instill 1 drop by ophthalmic route 2 times every day into both eyes approximately 12 hours apart       ALLERGIES:    Vitals Date Temp F BP Pulse Ht In Wt Lb BMI BSA Pain Score  02/18/2016  161/88 58 58 115.8 24.2  6/10       IMPRESSION Patient has progression of scoliotic deformity and wishes to proceed with surgery.  Completed Orders (this encounter) Order Details Reason Side Interpretation Result Initial Treatment Date Region  Lifestyle education regarding diet Patient encouraged to eat a well balenced diet.        Lifestyle education Patient to follow up with primary care provider         Assessment/Plan # Detail Type Description   1. Assessment Spinal stenosis of lumbar region (M48.06).       2. 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       3. Assessment Low back pain, unspecified back pain laterality, with sciatica presence unspecified (M54.5).       4. Assessment Lumbar radiculopathy (M54.16).       5. Assessment Body mass index (BMI) 24.0-24.9, adult CF:[id number]).   Plan Orders Today's instructions / counseling include(s) Lifestyle education regarding diet.       6. Assessment Essential (primary) hypertension (I10).         Pain Assessment/Treatment Pain Scale: 6/10. Method: Numeric Pain Intensity Scale. Location: back. Onset: 11/21/2014. Duration: varies. Quality: discomforting. Pain Assessment/Treatment follow-up plan of care: Patient taking medication as prescribed.  I will review patient's studies with Dr. Randel Pigg and discuss those recommendations with the patient, the, if she decides to do so, we will proceed with surgical correction of her acquired scoliotic spinal deformity.  Orders: Diagnostic Procedures: Assessment Procedure  M41.35 XLIF - L1-L2 - L2-L3 pedicle screw fixation T10 through L2 levels with Airo  Instruction(s)/Education: Assessment Instruction  I10 Lifestyle education  [phone] Lifestyle education regarding diet             Provider:  Marchia Meiers. Vertell Limber MD  03/24/2016 08:12 AM Dictation edited by: Marchia Meiers. Vertell Limber    CC Providers: Deland Pretty [facility] [address] [place],  [place]   [id number]-   Anna Voytek [facility] [address] [place], [place] [address]              Electronically signed by Marchia Meiers. Vertell Limber MD on 03/24/2016 08:13 AM  Patient ID:   [id number] Patient: [name]  Date of Birth: Nov 30, 1946 Visit Type: Office Visit   Date: 01/24/2016 12:00 PM Provider: Marchia Meiers. Vertell Limber MD   This 70 year old female presents for back pain.  History of Present Illness: 1.  back pain  Patient returns to review her imaging  The patient is continuing to complain of right buttock pain and right hip flexor weakness.  She says that she is doing better after a steroid injection 7 weeks ago.  However she complains of persistent worsening right hip flexor weakness.  She had a thoracic MRI which did not show significant spinal stenosis or structural pathology but she has not authorized by her insurance to also have a lumbar study.  Clearly this needs to be performed because she is weak in her right leg and on examination today I grade her strength as 4 out of 5 in her right hip flexors and 4 minus out of 5 in her right quadriceps.  I am quite concerned about her weakness and believe that this needs to be further evaluated.  I have recommended that lumbar MRI be performed and the patient will come back to see me after this has been done.      Medical/Surgical/Interim History Reviewed, no change.  Last detailed document date:02/16/2014.   PAST MEDICAL HISTORY, SURGICAL HISTORY, FAMILY HISTORY, SOCIAL HISTORY AND REVIEW OF SYSTEMS I have reviewed the patient's past medical, surgical, family and social history as well as the comprehensive review of systems as included on the [facility] history form dated 11/22/2015, which I have signed.  Family History: Reviewed, no changes.  Last detailed document: 02/16/2014.   Social History: Tobacco use reviewed. Reviewed, no changes. Last detailed  document date: 02/16/2014.      MEDICATIONS(added, continued or stopped this visit): Started Medication Directions Instruction Stopped   aspirin 81 mg tablet,delayed release take 1 tablet by oral route  every day     diclofenac Take once daily     Maxzide-25mg  37.5 mg-25 mg tablet take 0.5 tablet by oral route  every day     prednisone 5 mg tablet take 1 tablet by oral route  every day     timolol maleate 0.5 % eye drops instill 1 drop in each eye nightly     tramadol 50 mg tablet take 1 tablet by oral route  every 6 hours as needed     Xiidra 5 % eye drops in a dropperette instill 1 drop by ophthalmic route 2 times every day into both eyes approximately 12 hours apart       ALLERGIES:    Vitals Date Temp F BP Pulse Ht In Wt Lb BMI BSA Pain Score  01/24/2016  122/74 76 58 112 23.41  1/10      IMPRESSION Worsening right leg weakness.  Proceed with lumbar imaging.  Follow-up with me after that is been performed.  Assessment/Plan # Detail Type Description   1. Assessment Spondylolisthesis, lumbar region (M43.16).       2. Assessment Low back pain, unspecified back pain laterality, with sciatica presence unspecified (M54.5).       3. 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       4. Assessment Lumbar radiculopathy (M54.16).       5. Assessment Spinal stenosis of lumbar region (M48.06).         Pain Assessment/Treatment Pain Scale: 1/10. Method: Numeric Pain Intensity Scale. Location: back. Onset: 11/21/2014. Duration: varies. Quality: discomforting. Pain Assessment/Treatment follow-up plan of care: Patient taking medications as prescribed..  Fall Risk Plan The patient has not fallen in the last year.  Patient will follow up with me after lumbar MRI has been performed.  Orders: Diagnostic Procedures: Assessment Procedure  M48.06 MRI Spine/lumb  With & W/o Contrast             Provider:  Marchia Meiers. Vertell Limber MD  01/29/2016 01:08  PM Dictation edited by: Marchia Meiers. Vertell Limber    CC Providers: Deland Pretty [facility] [address] [place],  [place]  [address]-   Anna Voytek [facility] [address] [place], [place] [address]              Electronically signed by Marchia Meiers. Vertell Limber MD on 01/29/2016 01:08 PM  Patient ID:   [id number] Patient: [name]  Date of Birth: 12-06-1946 Visit Type: Office Visit   Date: 11/22/2015 03:15 PM Provider: Marchia Meiers. Vertell Limber MD   This 70 year old female presents for back pain.  History of Present Illness: 1.  back pain  The patient previously underwent L2 through L5 posterior lumbar fusion with Dr. Luiz Ochoa in August 2014.  She now comes in complaining of increasingly severe back pain.  She is also complaining of pain into both upper buttocks right greater than left.  She describes the pain as 10 out of 10 at times.  She says the pain goes into her right leg to her knee and occasionally into her foot and that her left leg is not as severe.  Plan radiographs were reviewed which show significant progression of degenerative changes and scoliosis L1 and L2 T12-L1 levels.  The patient has some right hip flexor weakness on examination today.  At this point I think that further imaging needs to be performed and I've recommended that she undergo an MRI of her thoracic and lumbar spine along with review lumbar radiographs and AP and lateral scoliosis x-rays.  The patient will return to review these.      Medical/Surgical/Interim History Reviewed, no change.  Last detailed document date:02/16/2014.   PAST MEDICAL HISTORY, SURGICAL HISTORY, FAMILY HISTORY, SOCIAL HISTORY AND REVIEW OF SYSTEMS I have reviewed the patient's past medical, surgical, family and social history as well as the comprehensive review of systems as included on the [facility] history form dated 11/22/2015,  which I have signed.  Family History: Reviewed, no changes.  Last detailed document: 02/16/2014.   Social History: Tobacco use reviewed. Reviewed, no changes. Last detailed document date: 02/16/2014.      MEDICATIONS(added, continued or stopped this visit): Started Medication Directions Instruction Stopped   aspirin 81 mg tablet,delayed release take 1 tablet by oral route  every day     Benicar take as directed  11/22/2015   diclofenac Take once daily     doxycycline hyclate 20 mg tablet take 1 tablet by oral route  every day  11/22/2015  09/17/2013 hydrocodone 5 mg-acetaminophen 325 mg tablet Take 1-2 tablets q6-8 h prn pain  11/22/2015   Maxzide-25mg  37.5 mg-25 mg tablet take 0.5 tablet by oral route  every day     omeprazole 20 mg tablet,delayed release take 2 tablets by mouth every day  11/22/2015   Plaquenil 200 mg tablet take 1 tablet by oral route 2 times every day  11/22/2015   timolol maleate 0.5 % eye drops instill 1 drop in each eye nightly     tramadol 50 mg tablet take 1 tablet by oral route  every 6 hours as needed     Voltaren 1 % topical gel apply (2G)  by topical route as needed  11/22/2015   voltaren 75mg  ORAL take 1 tablet by mouth BID as  needed  11/22/2015   Xiidra 5 % eye drops in a dropperette instill 1 drop by ophthalmic route 2 times every day into both eyes approximately 12 hours apart       ALLERGIES: Ingredient Reaction Medication Name Comment  NO KNOWN ALLERGIES     No known allergies. Reviewed, no changes.    Vitals Date Temp F BP Pulse Ht In Wt Lb BMI BSA Pain Score  11/22/2015  131/78 73 58 111 23.2  7/10      IMPRESSION The patient is worsening in terms of back and leg pain and has progression of scoliosis.  Completed Orders (this encounter) Order Details Reason Side Interpretation Result Initial Treatment Date Region  Scoliosis- AP/Lat one of two     11/22/2015 All Levels to All Levels  Lumbar Spine- AP/Lat/Flex/Ex two of two      11/22/2015 All Levels to All Levels   Assessment/Plan # Detail Type Description   1. 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       2. Assessment Low back pain, unspecified back pain laterality, with sciatica presence unspecified (M54.5).       3. Assessment Spondylolisthesis, lumbar region (M43.16).       4. Assessment Scoliosis (and kyphoscoliosis), idiopathic (M41.20).       5. Assessment Lumbar radiculopathy (M54.16).         Pain Assessment/Treatment Pain Scale: 7/10. Method: Numeric Pain Intensity Scale. Location: back. Onset: 11/21/2014. Duration: varies. Quality: discomforting. Pain Assessment/Treatment follow-up plan of care: Patient is taking medications as prescribed..  Fall Risk Plan The Patient has fallen 1 times in the last year.  Falls risk follow-up plan of care: Assisted devices: Advised patient to use handrails..  The patient will return with repeat thoracolumbar imaging.  Orders: Diagnostic Procedures: Assessment Procedure  M41.20 Scoliosis- AP/Lat  M41.35 MRI Spine/thor W/o Contrast  M41.35 Scoliosis- AP/Lat  M43.16 Lumbar Spine- AP/Lat/Flex/Ex  M43.16 MRI Spine/lumb With & W/o Contrast  M54.16 Lumbar Spine- AP/Lat/Flex/Ex             Provider:  Marchia Meiers. Vertell Limber MD  11/27/2015 04:12 PM Dictation edited by: Marchia Meiers. Vertell Limber    CC Providers: Deland Pretty [facility] [address] [place],  [place]  [address]-   Anna Voytek Murphy [facility] [address] [place], [place] [address]              Electronically signed by Marchia Meiers. Vertell Limber MD on 11/27/2015 04:12 PM  > [address] [place], [place] [address] Phone: [phone]   Patient ID:   [id number] Patient: [name]  Date of Birth: November 27, 1946 Visit Type: Office Visit   Date: 07/20/2014 11:00 AM Provider: Marchia Meiers. Vertell Limber MD   This 70 year old female  presents for Follow Up of back pain.  History of Present Illness: 1.  Follow Up of back pain  08/14/13  L2-5 PLIF DrHirsch  Pt continues to improve.  Diclofenac 75mg  1/day.  Tramadol 50mg  1/day. To see rheumatologist tomorrow.   X-ray on [place]  Patient notes stiffness and soreness but otherwise no significant pain.  Her lumbar imaging demonstrates significant scoliosis above her previous fusion with solid arthrodesis L2 through L5 levels.  Early the patient is not symptomatic from this and we have discussed as needed follow-up.  She is going to continue on diclofenac and tramadol on an as-needed basis.      Medical/Surgical/Interim History Reviewed, no change.  Last detailed  document date:02/16/2014.   PAST MEDICAL HISTORY, SURGICAL HISTORY, FAMILY HISTORY, SOCIAL HISTORY AND REVIEW OF SYSTEMS I have reviewed the patient's past medical, surgical, family and social history as well as the comprehensive review of systems as included on the [facility] history form dated 12/15/2013, which I have signed.  Family History: Reviewed, no changes.  Last detailed document: 02/16/2014.   Social History: Tobacco use reviewed. Reviewed, no changes. Last detailed document date: 02/16/2014.      MEDICATIONS(added, continued or stopped this visit):   Started Medication Directions Instruction Stopped   aspirin 81 mg tablet,delayed release take 1 tablet by oral route  every day     Benicar take as directed     doxycycline hyclate 20 mg tablet take 1 tablet by oral route  every day    09/17/2013 hydrocodone 5 mg-acetaminophen 325 mg tablet Take 1-2 tablets q6-8 h prn pain     omeprazole 20 mg tablet,delayed release take 2 tablets by mouth every day     Plaquenil 200 mg tablet take 1 tablet by oral route 2 times every day     timolol maleate 0.5 % eye drops instill 1 drop in each eye nightly     tramadol 50 mg tablet take 1 tablet by oral route  every 6 hours as  needed     Voltaren 1 % topical gel apply (2G)  by topical route as needed     voltaren 75mg  ORAL take 1 tablet by mouth BID as needed      ALLERGIES:  Ingredient Reaction Medication Name Comment  NO KNOWN ALLERGIES     No known allergies. Reviewed, no changes.   Vitals Date Temp F BP Pulse Ht In Wt Lb BMI BSA Pain Score  07/20/2014  160/97 64 58 117 24.45  0/10        IMPRESSION Patient has scoliosis and lumbago with prior fusion L2 through L5 levels.  She is going to continue with conservative management and will follow up with me on an as-needed basis in the future.  Completed Orders (this encounter) Order Details Reason Side Interpretation Result Initial Treatment Date Region  Hypertension education Follow up with primary care physician.         Assessment/Plan # Detail Type Description   1. Assessment Spinal Stenosis/thoracic (724.01).       2. Assessment Thoracogenic scoliosis of thoracolumbar region (737.34).       3. Assessment Spondylolisthesis/acq (738.4).       4. Assessment Hypertension, Unspecified (401.9).         Pain Assessment/Treatment Pain Scale: 0/10. Method: Numeric Pain Intensity Scale.  Fall Risk Plan The Patient has fallen 1 times in the last year. The fall(s) resulted in injury. Details: cut head. Falls risk follow-up plan of care: Assisted devices: Advised patient to use walker..  When necessary follow-up.  Orders: Diagnostic Procedures: Assessment Procedure  724.01 Return to Clinic  Instruction(s)/Education: Assessment Instruction  401.9 Hypertension education             Provider:  Marchia Meiers. Vertell Limber MD  07/26/2014 05:57 PM Dictation edited by: Marchia Meiers. Vertell Limber    CC Providers: Deland Pretty [facility] [address] [place],  [place]  [address]-   Anna Voytek Murphy [facility] [address] [place], [place]  [address] ----------------------------------------------------------------------------------------------------------------------------------------------------------------------         Electronically signed by Marchia Meiers. Vertell Limber MD on 07/26/2014 05:57 PM

## 2016-06-01 MED ORDER — CEFAZOLIN SODIUM-DEXTROSE 2-4 GM/100ML-% IV SOLN
2.0000 g | INTRAVENOUS | Status: AC
Start: 2016-06-02 — End: 2016-06-02
  Administered 2016-06-02 (×2): 2 g via INTRAVENOUS
  Filled 2016-06-01: qty 100

## 2016-06-01 MED ORDER — DEXTROSE 5 % IV SOLN
2.0000 g | INTRAVENOUS | Status: DC
  Filled 2016-06-01: qty 20

## 2016-06-02 ENCOUNTER — Inpatient Hospital Stay (HOSPITAL_COMMUNITY): Payer: Medicare HMO

## 2016-06-02 ENCOUNTER — Inpatient Hospital Stay (HOSPITAL_COMMUNITY): Payer: Medicare HMO | Admitting: Emergency Medicine

## 2016-06-02 ENCOUNTER — Inpatient Hospital Stay (HOSPITAL_COMMUNITY)
Admission: RE | Admit: 2016-06-02 | Discharge: 2016-06-09 | DRG: 454 | Disposition: A | Discharge status: Rehabilitation Facility | Payer: Medicare HMO | Source: Ambulatory Visit | Attending: Neurosurgery | Admitting: Neurosurgery

## 2016-06-02 ENCOUNTER — Encounter (HOSPITAL_COMMUNITY)
Admission: RE | Disposition: A | Discharge status: Rehabilitation Facility | Payer: Self-pay | Source: Ambulatory Visit | Attending: Neurosurgery

## 2016-06-02 DIAGNOSIS — G8929 Other chronic pain: Secondary | ICD-10-CM | POA: Diagnosis present

## 2016-06-02 DIAGNOSIS — M3320 Polymyositis, organ involvement unspecified: Secondary | ICD-10-CM | POA: Diagnosis not present

## 2016-06-02 DIAGNOSIS — Z9841 Cataract extraction status, right eye: Secondary | ICD-10-CM | POA: Diagnosis not present

## 2016-06-02 DIAGNOSIS — M545 Low back pain: Secondary | ICD-10-CM | POA: Diagnosis not present

## 2016-06-02 DIAGNOSIS — I248 Other forms of acute ischemic heart disease: Secondary | ICD-10-CM | POA: Diagnosis not present

## 2016-06-02 DIAGNOSIS — Z961 Presence of intraocular lens: Secondary | ICD-10-CM | POA: Diagnosis present

## 2016-06-02 DIAGNOSIS — Z9842 Cataract extraction status, left eye: Secondary | ICD-10-CM | POA: Diagnosis not present

## 2016-06-02 DIAGNOSIS — K219 Gastro-esophageal reflux disease without esophagitis: Secondary | ICD-10-CM | POA: Diagnosis present

## 2016-06-02 DIAGNOSIS — G8220 Paraplegia, unspecified: Secondary | ICD-10-CM | POA: Diagnosis not present

## 2016-06-02 DIAGNOSIS — Z0389 Encounter for observation for other suspected diseases and conditions ruled out: Secondary | ICD-10-CM | POA: Diagnosis not present

## 2016-06-02 DIAGNOSIS — M4806 Spinal stenosis, lumbar region: Secondary | ICD-10-CM | POA: Diagnosis not present

## 2016-06-02 DIAGNOSIS — M069 Rheumatoid arthritis, unspecified: Secondary | ICD-10-CM | POA: Diagnosis not present

## 2016-06-02 DIAGNOSIS — I7300 Raynaud's syndrome without gangrene: Secondary | ICD-10-CM | POA: Diagnosis present

## 2016-06-02 DIAGNOSIS — I10 Essential (primary) hypertension: Secondary | ICD-10-CM | POA: Diagnosis present

## 2016-06-02 DIAGNOSIS — M5417 Radiculopathy, lumbosacral region: Secondary | ICD-10-CM | POA: Diagnosis not present

## 2016-06-02 DIAGNOSIS — M4125 Other idiopathic scoliosis, thoracolumbar region: Secondary | ICD-10-CM | POA: Diagnosis not present

## 2016-06-02 DIAGNOSIS — M539 Dorsopathy, unspecified: Secondary | ICD-10-CM | POA: Diagnosis not present

## 2016-06-02 DIAGNOSIS — M4316 Spondylolisthesis, lumbar region: Secondary | ICD-10-CM | POA: Diagnosis present

## 2016-06-02 DIAGNOSIS — R7989 Other specified abnormal findings of blood chemistry: Secondary | ICD-10-CM | POA: Diagnosis not present

## 2016-06-02 DIAGNOSIS — Z4889 Encounter for other specified surgical aftercare: Secondary | ICD-10-CM | POA: Diagnosis not present

## 2016-06-02 DIAGNOSIS — M3500 Sicca syndrome, unspecified: Secondary | ICD-10-CM | POA: Diagnosis present

## 2016-06-02 DIAGNOSIS — Z419 Encounter for procedure for purposes other than remedying health state, unspecified: Secondary | ICD-10-CM

## 2016-06-02 DIAGNOSIS — M5416 Radiculopathy, lumbar region: Secondary | ICD-10-CM | POA: Diagnosis not present

## 2016-06-02 DIAGNOSIS — H409 Unspecified glaucoma: Secondary | ICD-10-CM | POA: Diagnosis present

## 2016-06-02 DIAGNOSIS — R1310 Dysphagia, unspecified: Secondary | ICD-10-CM | POA: Diagnosis not present

## 2016-06-02 DIAGNOSIS — I959 Hypotension, unspecified: Secondary | ICD-10-CM | POA: Diagnosis present

## 2016-06-02 DIAGNOSIS — M4326 Fusion of spine, lumbar region: Secondary | ICD-10-CM | POA: Diagnosis not present

## 2016-06-02 DIAGNOSIS — Z981 Arthrodesis status: Secondary | ICD-10-CM

## 2016-06-02 DIAGNOSIS — D62 Acute posthemorrhagic anemia: Secondary | ICD-10-CM | POA: Diagnosis not present

## 2016-06-02 DIAGNOSIS — E7800 Pure hypercholesterolemia, unspecified: Secondary | ICD-10-CM | POA: Diagnosis present

## 2016-06-02 DIAGNOSIS — T814XXS Infection following a procedure, sequela: Secondary | ICD-10-CM | POA: Diagnosis not present

## 2016-06-02 DIAGNOSIS — K5900 Constipation, unspecified: Secondary | ICD-10-CM | POA: Diagnosis not present

## 2016-06-02 DIAGNOSIS — Z7952 Long term (current) use of systemic steroids: Secondary | ICD-10-CM

## 2016-06-02 DIAGNOSIS — M4135 Thoracogenic scoliosis, thoracolumbar region: Secondary | ICD-10-CM | POA: Diagnosis not present

## 2016-06-02 DIAGNOSIS — E871 Hypo-osmolality and hyponatremia: Secondary | ICD-10-CM | POA: Diagnosis not present

## 2016-06-02 DIAGNOSIS — M0530 Rheumatoid heart disease with rheumatoid arthritis of unspecified site: Secondary | ICD-10-CM | POA: Diagnosis present

## 2016-06-02 DIAGNOSIS — M810 Age-related osteoporosis without current pathological fracture: Secondary | ICD-10-CM | POA: Diagnosis present

## 2016-06-02 DIAGNOSIS — N390 Urinary tract infection, site not specified: Secondary | ICD-10-CM | POA: Diagnosis not present

## 2016-06-02 DIAGNOSIS — Z79899 Other long term (current) drug therapy: Secondary | ICD-10-CM | POA: Diagnosis not present

## 2016-06-02 DIAGNOSIS — N3942 Incontinence without sensory awareness: Secondary | ICD-10-CM | POA: Diagnosis not present

## 2016-06-02 DIAGNOSIS — M4155 Other secondary scoliosis, thoracolumbar region: Principal | ICD-10-CM | POA: Diagnosis present

## 2016-06-02 DIAGNOSIS — R0789 Other chest pain: Secondary | ICD-10-CM | POA: Diagnosis not present

## 2016-06-02 DIAGNOSIS — D509 Iron deficiency anemia, unspecified: Secondary | ICD-10-CM | POA: Diagnosis not present

## 2016-06-02 DIAGNOSIS — M549 Dorsalgia, unspecified: Secondary | ICD-10-CM | POA: Diagnosis present

## 2016-06-02 DIAGNOSIS — M4807 Spinal stenosis, lumbosacral region: Secondary | ICD-10-CM | POA: Diagnosis not present

## 2016-06-02 DIAGNOSIS — R609 Edema, unspecified: Secondary | ICD-10-CM | POA: Diagnosis not present

## 2016-06-02 DIAGNOSIS — E876 Hypokalemia: Secondary | ICD-10-CM | POA: Diagnosis not present

## 2016-06-02 DIAGNOSIS — R600 Localized edema: Secondary | ICD-10-CM | POA: Diagnosis not present

## 2016-06-02 DIAGNOSIS — M4127 Other idiopathic scoliosis, lumbosacral region: Secondary | ICD-10-CM | POA: Diagnosis not present

## 2016-06-02 DIAGNOSIS — M961 Postlaminectomy syndrome, not elsewhere classified: Secondary | ICD-10-CM | POA: Insufficient documentation

## 2016-06-02 DIAGNOSIS — R079 Chest pain, unspecified: Secondary | ICD-10-CM | POA: Diagnosis not present

## 2016-06-02 DIAGNOSIS — D72829 Elevated white blood cell count, unspecified: Secondary | ICD-10-CM | POA: Insufficient documentation

## 2016-06-02 DIAGNOSIS — M4120 Other idiopathic scoliosis, site unspecified: Secondary | ICD-10-CM | POA: Diagnosis not present

## 2016-06-02 HISTORY — PX: ANTERIOR LUMBAR FUSION: SHX1170

## 2016-06-02 HISTORY — PX: POSTERIOR LUMBAR FUSION 4 LEVEL: SHX6037

## 2016-06-02 HISTORY — PX: ABDOMINAL EXPOSURE: SHX5708

## 2016-06-02 HISTORY — PX: APPLICATION OF INTRAOPERATIVE CT SCAN: SHX6668

## 2016-06-02 HISTORY — PX: ANTERIOR LAT LUMBAR FUSION: SHX1168

## 2016-06-02 LAB — CBC WITH DIFFERENTIAL/PLATELET
BASOS PCT: 0 %
Basophils Absolute: 0 10*3/uL (ref 0.0–0.1)
EOS PCT: 0 %
Eosinophils Absolute: 0 10*3/uL (ref 0.0–0.7)
HEMATOCRIT: 30.3 % — AB (ref 36.0–46.0)
Hemoglobin: 10.2 g/dL — ABNORMAL LOW (ref 12.0–15.0)
LYMPHS ABS: 0.9 10*3/uL (ref 0.7–4.0)
Lymphocytes Relative: 8 %
MCH: 27.3 pg (ref 26.0–34.0)
MCHC: 33.7 g/dL (ref 30.0–36.0)
MCV: 81 fL (ref 78.0–100.0)
MONOS PCT: 8 %
Monocytes Absolute: 0.9 10*3/uL (ref 0.1–1.0)
NEUTROS ABS: 9.6 10*3/uL — AB (ref 1.7–7.7)
Neutrophils Relative %: 84 %
Platelets: 231 10*3/uL (ref 150–400)
RBC: 3.74 MIL/uL — ABNORMAL LOW (ref 3.87–5.11)
RDW: 14.9 % (ref 11.5–15.5)
WBC: 11.4 10*3/uL — ABNORMAL HIGH (ref 4.0–10.5)

## 2016-06-02 LAB — TROPONIN I: Troponin I: 0.04 ng/mL — ABNORMAL HIGH (ref <0.031)

## 2016-06-02 LAB — BASIC METABOLIC PANEL
Anion gap: 9 (ref 5–15)
Anion gap: 8 (ref 5–15)
BUN: 12 mg/dL (ref 6–20)
BUN: 12 mg/dL (ref 6–20)
CALCIUM: 8.3 mg/dL — AB (ref 8.9–10.3)
CHLORIDE: 96 mmol/L — AB (ref 101–111)
CO2: 26 mmol/L (ref 22–32)
CO2: 23 mmol/L (ref 22–32)
CREATININE: 0.57 mg/dL (ref 0.44–1.00)
CREATININE: 0.53 mg/dL (ref 0.44–1.00)
Calcium: 9.7 mg/dL (ref 8.9–10.3)
Chloride: 102 mmol/L (ref 101–111)
GFR calc Af Amer: >60 mL/min (ref >60)
GFR calc non Af Amer: >60 mL/min (ref >60)
GFR calc non Af Amer: >60 mL/min (ref >60)
GLUCOSE: 80 mg/dL (ref 65–99)
GLUCOSE: 132 mg/dL — AB (ref 65–99)
Potassium: 3.4 mmol/L — ABNORMAL LOW (ref 3.5–5.1)
Potassium: 3.5 mmol/L (ref 3.5–5.1)
Sodium: 131 mmol/L — ABNORMAL LOW (ref 135–145)
Sodium: 133 mmol/L — ABNORMAL LOW (ref 135–145)

## 2016-06-02 LAB — PREPARE RBC (CROSSMATCH)

## 2016-06-02 LAB — CK: Total CK: 350 U/L — ABNORMAL HIGH (ref 38–234)

## 2016-06-02 SURGERY — ANTERIOR LUMBAR FUSION 1 LEVEL
Anesthesia: General | Site: Flank

## 2016-06-02 MED ORDER — FLEET ENEMA 7-19 GM/118ML RE ENEM
1.0000 | ENEMA | Freq: Once | RECTAL | Status: DC | PRN
  Filled 2016-06-02 (×2): qty 1

## 2016-06-02 MED ORDER — EPHEDRINE SULFATE 50 MG/ML IJ SOLN
INTRAMUSCULAR | Status: DC | PRN
  Administered 2016-06-02 (×4): 5 mg via INTRAVENOUS

## 2016-06-02 MED ORDER — GLYCOPYRROLATE 0.2 MG/ML IJ SOLN
INTRAMUSCULAR | Status: DC | PRN
  Administered 2016-06-02: 0.1 mg via INTRAVENOUS
  Administered 2016-06-02 (×4): 0.2 mg via INTRAVENOUS

## 2016-06-02 MED ORDER — FENTANYL CITRATE (PF) 250 MCG/5ML IJ SOLN
INTRAMUSCULAR | Status: AC
  Filled 2016-06-02: qty 5

## 2016-06-02 MED ORDER — FIBER SELECT GUMMIES PO CHEW: 2.0000 | CHEWABLE_TABLET | Freq: Every day | ORAL | Status: DC

## 2016-06-02 MED ORDER — LIDOCAINE 2% (20 MG/ML) 5 ML SYRINGE
INTRAMUSCULAR | Status: AC
  Filled 2016-06-02: qty 5

## 2016-06-02 MED ORDER — MIDAZOLAM HCL 2 MG/2ML IJ SOLN
INTRAMUSCULAR | Status: AC
  Filled 2016-06-02: qty 2

## 2016-06-02 MED ORDER — GUAIFENESIN ER 600 MG PO TB12: 600.0000 mg | ORAL_TABLET | Freq: Two times a day (BID) | ORAL | Status: DC | PRN

## 2016-06-02 MED ORDER — HYDROCODONE-ACETAMINOPHEN 5-325 MG PO TABS
1.0000 | ORAL_TABLET | ORAL | Status: DC | PRN
  Administered 2016-06-03: 1 via ORAL
  Filled 2016-06-02: qty 1

## 2016-06-02 MED ORDER — BISACODYL 10 MG RE SUPP
10.0000 mg | Freq: Every day | RECTAL | Status: DC | PRN
  Administered 2016-06-06: 10 mg via RECTAL
  Filled 2016-06-02 (×2): qty 1

## 2016-06-02 MED ORDER — SODIUM CHLORIDE 0.9% FLUSH: 3.0000 mL | INTRAVENOUS | Status: DC | PRN

## 2016-06-02 MED ORDER — PROPOFOL 10 MG/ML IV BOLUS
INTRAVENOUS | Status: DC | PRN
Start: 2016-06-02 — End: 2016-06-02
  Administered 2016-06-02: 110 mg via INTRAVENOUS

## 2016-06-02 MED ORDER — HYDROMORPHONE HCL 1 MG/ML IJ SOLN
0.2500 mg | INTRAMUSCULAR | Status: DC | PRN
  Administered 2016-06-02 (×2): 0.5 mg via INTRAVENOUS

## 2016-06-02 MED ORDER — ROCURONIUM BROMIDE 50 MG/5ML IV SOLN
INTRAVENOUS | Status: AC
  Filled 2016-06-02: qty 1

## 2016-06-02 MED ORDER — MAGNESIUM HYDROXIDE 400 MG/5ML PO SUSP: 15.0000 mL | Freq: Every day | ORAL | Status: DC | PRN

## 2016-06-02 MED ORDER — PROPOFOL 10 MG/ML IV BOLUS
INTRAVENOUS | Status: AC
  Filled 2016-06-02: qty 20

## 2016-06-02 MED ORDER — ACETAMINOPHEN 650 MG RE SUPP: 650.0000 mg | RECTAL | Status: DC | PRN

## 2016-06-02 MED ORDER — EPHEDRINE 5 MG/ML INJ
INTRAVENOUS | Status: AC
  Filled 2016-06-02: qty 20

## 2016-06-02 MED ORDER — KCL IN DEXTROSE-NACL 20-5-0.45 MEQ/L-%-% IV SOLN
INTRAVENOUS | Status: DC
  Administered 2016-06-02: 1000 mL via INTRAVENOUS
  Filled 2016-06-02 (×5): qty 1000

## 2016-06-02 MED ORDER — ROCURONIUM BROMIDE 100 MG/10ML IV SOLN
INTRAVENOUS | Status: DC | PRN
  Administered 2016-06-02: 10 mg via INTRAVENOUS
  Administered 2016-06-02: 40 mg via INTRAVENOUS

## 2016-06-02 MED ORDER — SUCCINYLCHOLINE CHLORIDE 200 MG/10ML IV SOSY
PREFILLED_SYRINGE | INTRAVENOUS | Status: AC
  Filled 2016-06-02: qty 10

## 2016-06-02 MED ORDER — CALCIUM-VITAMIN D-VITAMIN K 500-500-40 MG-UNT-MCG PO CHEW: CHEWABLE_TABLET | Freq: Every day | ORAL | Status: DC

## 2016-06-02 MED ORDER — LACTATED RINGERS IV SOLN
INTRAVENOUS | Status: DC | PRN
  Administered 2016-06-02 (×3): via INTRAVENOUS

## 2016-06-02 MED ORDER — HYDROMORPHONE HCL 1 MG/ML IJ SOLN
0.5000 mg | INTRAMUSCULAR | Status: DC | PRN
  Administered 2016-06-02 (×2): 1 mg via INTRAVENOUS
  Filled 2016-06-02 (×2): qty 1

## 2016-06-02 MED ORDER — TRIAMTERENE-HCTZ 37.5-25 MG PO TABS
1.0000 | ORAL_TABLET | Freq: Every day | ORAL | Status: DC
  Filled 2016-06-02 (×4): qty 1

## 2016-06-02 MED ORDER — PHENYLEPHRINE HCL 10 MG/ML IJ SOLN
10.0000 mg | INTRAVENOUS | Status: DC | PRN
  Administered 2016-06-02: 25 ug/min via INTRAVENOUS

## 2016-06-02 MED ORDER — VITAMIN D 1000 UNITS PO TABS
5000.0000 [IU] | ORAL_TABLET | Freq: Every day | ORAL | Status: DC
  Administered 2016-06-03 – 2016-06-06 (×4): 5000 [IU] via ORAL
  Filled 2016-06-02 (×8): qty 5

## 2016-06-02 MED ORDER — SODIUM CHLORIDE 0.9 % IV SOLN
INTRAVENOUS | Status: DC | PRN
  Administered 2016-06-02: 07:00:00 via INTRAVENOUS

## 2016-06-02 MED ORDER — GLYCOPYRROLATE 0.2 MG/ML IV SOSY
PREFILLED_SYRINGE | INTRAVENOUS | Status: AC
  Filled 2016-06-02: qty 3

## 2016-06-02 MED ORDER — SODIUM CHLORIDE 0.9% FLUSH
3.0000 mL | Freq: Two times a day (BID) | INTRAVENOUS | Status: DC
  Administered 2016-06-02 – 2016-06-08 (×11): 3 mL via INTRAVENOUS

## 2016-06-02 MED ORDER — POLYETHYLENE GLYCOL 3350 17 G PO PACK
17.0000 g | PACK | Freq: Every day | ORAL | Status: DC | PRN
  Filled 2016-06-02: qty 1

## 2016-06-02 MED ORDER — ACETAMINOPHEN 325 MG PO TABS: 650.0000 mg | ORAL_TABLET | ORAL | Status: DC | PRN

## 2016-06-02 MED ORDER — METHOCARBAMOL 1000 MG/10ML IJ SOLN
500.0000 mg | Freq: Four times a day (QID) | INTRAVENOUS | Status: DC | PRN
  Administered 2016-06-02 – 2016-06-04 (×4): 500 mg via INTRAVENOUS
  Filled 2016-06-02 (×5): qty 5

## 2016-06-02 MED ORDER — PHENOL 1.4 % MT LIQD
1.0000 | OROMUCOSAL | Status: DC | PRN
  Administered 2016-06-04: 1 via OROMUCOSAL
  Filled 2016-06-02: qty 177

## 2016-06-02 MED ORDER — BUPIVACAINE LIPOSOME 1.3 % IJ SUSP
INTRAMUSCULAR | Status: DC | PRN
  Administered 2016-06-02: 40 mL

## 2016-06-02 MED ORDER — BUPIVACAINE HCL (PF) 0.5 % IJ SOLN
INTRAMUSCULAR | Status: DC | PRN
  Administered 2016-06-02: 10 mL
  Administered 2016-06-02: 5 mL

## 2016-06-02 MED ORDER — METHOCARBAMOL 500 MG PO TABS
500.0000 mg | ORAL_TABLET | Freq: Four times a day (QID) | ORAL | Status: DC | PRN
  Administered 2016-06-08: 500 mg via ORAL
  Filled 2016-06-02 (×3): qty 1

## 2016-06-02 MED ORDER — SODIUM CHLORIDE 0.9 % IV SOLN: 250.0000 mL | INTRAVENOUS | Status: DC

## 2016-06-02 MED ORDER — FENTANYL CITRATE (PF) 100 MCG/2ML IJ SOLN
INTRAMUSCULAR | Status: DC | PRN
  Administered 2016-06-02 (×7): 50 ug via INTRAVENOUS
  Administered 2016-06-02: 25 ug via INTRAVENOUS
  Administered 2016-06-02 (×3): 50 ug via INTRAVENOUS
  Administered 2016-06-02: 75 ug via INTRAVENOUS
  Administered 2016-06-02: 100 ug via INTRAVENOUS
  Administered 2016-06-02: 50 ug via INTRAVENOUS

## 2016-06-02 MED ORDER — ACETAMINOPHEN 500 MG PO TABS: 500.0000 mg | ORAL_TABLET | ORAL | Status: DC | PRN

## 2016-06-02 MED ORDER — TRAMADOL HCL 50 MG PO TABS
50.0000 mg | ORAL_TABLET | Freq: Every morning | ORAL | Status: DC
  Administered 2016-06-03 – 2016-06-04 (×2): 50 mg via ORAL
  Filled 2016-06-02 (×2): qty 1

## 2016-06-02 MED ORDER — TETRAHYDROZOLINE HCL 0.05 % OP SOLN
2.0000 [drp] | Freq: Every day | OPHTHALMIC | Status: DC | PRN
  Administered 2016-06-02: 2 [drp] via OPHTHALMIC
  Filled 2016-06-02: qty 15

## 2016-06-02 MED ORDER — DIPHENHYDRAMINE HCL 12.5 MG/5ML PO ELIX: 6.2500 mg | ORAL_SOLUTION | Freq: Four times a day (QID) | ORAL | Status: DC | PRN

## 2016-06-02 MED ORDER — PREDNISONE 5 MG PO TABS
5.0000 mg | ORAL_TABLET | Freq: Every day | ORAL | Status: DC
  Administered 2016-06-03 – 2016-06-09 (×7): 5 mg via ORAL
  Filled 2016-06-02 (×8): qty 1

## 2016-06-02 MED ORDER — ONDANSETRON HCL 4 MG/2ML IJ SOLN
4.0000 mg | INTRAMUSCULAR | Status: DC | PRN
  Administered 2016-06-03 – 2016-06-05 (×2): 4 mg via INTRAVENOUS
  Filled 2016-06-02 (×2): qty 2

## 2016-06-02 MED ORDER — LACTATED RINGERS IV BOLUS (SEPSIS)
1000.0000 mL | Freq: Once | INTRAVENOUS | Status: AC
  Administered 2016-06-02: 1000 mL via INTRAVENOUS

## 2016-06-02 MED ORDER — CEFAZOLIN SODIUM 1-5 GM-% IV SOLN
1.0000 g | Freq: Three times a day (TID) | INTRAVENOUS | Status: AC
  Administered 2016-06-02 – 2016-06-03 (×2): 1 g via INTRAVENOUS
  Filled 2016-06-02 (×2): qty 50

## 2016-06-02 MED ORDER — LIDOCAINE HCL (CARDIAC) 20 MG/ML IV SOLN
INTRAVENOUS | Status: DC | PRN
  Administered 2016-06-02: 100 mg via INTRAVENOUS

## 2016-06-02 MED ORDER — DOCUSATE SODIUM 100 MG PO CAPS
100.0000 mg | ORAL_CAPSULE | Freq: Two times a day (BID) | ORAL | Status: DC
  Administered 2016-06-03 – 2016-06-06 (×6): 100 mg via ORAL
  Filled 2016-06-02 (×10): qty 1

## 2016-06-02 MED ORDER — PHENYLEPHRINE HCL 10 MG/ML IJ SOLN
INTRAMUSCULAR | Status: DC | PRN
  Administered 2016-06-02: 40 ug via INTRAVENOUS
  Administered 2016-06-02: 20 ug via INTRAVENOUS
  Administered 2016-06-02: 40 ug via INTRAVENOUS
  Administered 2016-06-02 (×2): 20 ug via INTRAVENOUS
  Administered 2016-06-02: 40 ug via INTRAVENOUS

## 2016-06-02 MED ORDER — MENTHOL 3 MG MT LOZG: 1.0000 | LOZENGE | OROMUCOSAL | Status: DC | PRN

## 2016-06-02 MED ORDER — HYDROMORPHONE HCL 1 MG/ML IJ SOLN
INTRAMUSCULAR | Status: AC
  Filled 2016-06-02: qty 1

## 2016-06-02 MED ORDER — PROMETHAZINE HCL 25 MG/ML IJ SOLN: 6.2500 mg | INTRAMUSCULAR | Status: DC | PRN

## 2016-06-02 MED ORDER — THROMBIN 20000 UNITS EX SOLR
CUTANEOUS | Status: DC | PRN
  Administered 2016-06-02: 12:00:00 via TOPICAL

## 2016-06-02 MED ORDER — METHYLPREDNISOLONE SODIUM SUCC 1000 MG IJ SOLR: 1000.0000 mg | Freq: Once | INTRAMUSCULAR | Status: DC

## 2016-06-02 MED ORDER — OXYCODONE-ACETAMINOPHEN 5-325 MG PO TABS
1.0000 | ORAL_TABLET | ORAL | Status: DC | PRN
  Administered 2016-06-05 – 2016-06-06 (×3): 2 via ORAL
  Administered 2016-06-06 (×2): 1 via ORAL
  Administered 2016-06-07 (×3): 2 via ORAL
  Administered 2016-06-08 (×4): 1 via ORAL
  Filled 2016-06-02 (×2): qty 2
  Filled 2016-06-02: qty 1
  Filled 2016-06-02 (×4): qty 2
  Filled 2016-06-02 (×2): qty 1
  Filled 2016-06-02 (×2): qty 2
  Filled 2016-06-02: qty 1

## 2016-06-02 MED ORDER — HYDROXYCHLOROQUINE SULFATE 200 MG PO TABS
200.0000 mg | ORAL_TABLET | Freq: Every day | ORAL | Status: DC
  Filled 2016-06-02 (×7): qty 1

## 2016-06-02 MED ORDER — METHYLPREDNISOLONE SODIUM SUCC 125 MG IJ SOLR
100.0000 mg | Freq: Once | INTRAMUSCULAR | Status: AC
  Administered 2016-06-02: 100 mg via INTRAVENOUS
  Filled 2016-06-02: qty 1.6

## 2016-06-02 MED ORDER — HYDROCODONE-ACETAMINOPHEN 7.5-325 MG/15ML PO SOLN: 10.0000 mL | ORAL | Status: DC | PRN

## 2016-06-02 MED ORDER — TIMOLOL MALEATE 0.5 % OP SOLN
1.0000 [drp] | Freq: Every day | OPHTHALMIC | Status: DC
Start: 2016-06-02 — End: 2016-06-09
  Administered 2016-06-02 – 2016-06-08 (×7): 1 [drp] via OPHTHALMIC
  Filled 2016-06-02: qty 5

## 2016-06-02 MED ORDER — PHENYLEPHRINE 40 MCG/ML (10ML) SYRINGE FOR IV PUSH (FOR BLOOD PRESSURE SUPPORT)
PREFILLED_SYRINGE | INTRAVENOUS | Status: AC
  Filled 2016-06-02: qty 20

## 2016-06-02 MED ORDER — ONDANSETRON HCL 4 MG/2ML IJ SOLN
INTRAMUSCULAR | Status: DC | PRN
  Administered 2016-06-02: 4 mg via INTRAVENOUS

## 2016-06-02 MED ORDER — BUPIVACAINE LIPOSOME 1.3 % IJ SUSP
20.0000 mL | Freq: Once | INTRAMUSCULAR | Status: DC
  Filled 2016-06-02: qty 20

## 2016-06-02 MED ORDER — NEOSTIGMINE METHYLSULFATE 10 MG/10ML IV SOLN
INTRAVENOUS | Status: DC | PRN
  Administered 2016-06-02: 1 mg via INTRAVENOUS
  Administered 2016-06-02: 2 mg via INTRAVENOUS

## 2016-06-02 MED ORDER — FAMOTIDINE IN NACL 20-0.9 MG/50ML-% IV SOLN
20.0000 mg | Freq: Two times a day (BID) | INTRAVENOUS | Status: DC
  Administered 2016-06-02 – 2016-06-03 (×2): 20 mg via INTRAVENOUS
  Filled 2016-06-02 (×2): qty 50

## 2016-06-02 MED ORDER — PANTOPRAZOLE SODIUM 40 MG PO TBEC
40.0000 mg | DELAYED_RELEASE_TABLET | Freq: Every day | ORAL | Status: DC
Start: 2016-06-03 — End: 2016-06-02

## 2016-06-02 MED ORDER — THROMBIN 5000 UNITS EX SOLR
OROMUCOSAL | Status: DC | PRN
  Administered 2016-06-02: 16:00:00 via TOPICAL

## 2016-06-02 MED ORDER — LIDOCAINE-EPINEPHRINE 1 %-1:100000 IJ SOLN
INTRAMUSCULAR | Status: DC | PRN
  Administered 2016-06-02: 5 mL
  Administered 2016-06-02: 10 mL

## 2016-06-02 MED ORDER — CHLORHEXIDINE GLUCONATE 4 % EX LIQD: 60.0000 mL | Freq: Once | CUTANEOUS | Status: DC

## 2016-06-02 MED ORDER — 0.9 % SODIUM CHLORIDE (POUR BTL) OPTIME
TOPICAL | Status: DC | PRN
  Administered 2016-06-02: 1000 mL

## 2016-06-02 MED ORDER — SUCCINYLCHOLINE CHLORIDE 20 MG/ML IJ SOLN
INTRAMUSCULAR | Status: DC | PRN
  Administered 2016-06-02: 100 mg via INTRAVENOUS

## 2016-06-02 MED ORDER — ONDANSETRON HCL 4 MG/2ML IJ SOLN
INTRAMUSCULAR | Status: AC
  Filled 2016-06-02: qty 2

## 2016-06-02 MED ORDER — ALUM & MAG HYDROXIDE-SIMETH 200-200-20 MG/5ML PO SUSP
30.0000 mL | Freq: Four times a day (QID) | ORAL | Status: DC | PRN
  Administered 2016-06-08: 30 mL via ORAL
  Filled 2016-06-02 (×2): qty 30

## 2016-06-02 SURGICAL SUPPLY — 150 items
APPLIER CLIP 11 MED OPEN (CLIP) ×5
BENZOIN TINCTURE PRP APPL 2/3 (GAUZE/BANDAGES/DRESSINGS) IMPLANT
BLADE CLIPPER SURG (BLADE) IMPLANT
BOLT BASE TI 5X17.5 VARIABLE (Bolt) ×15 IMPLANT
BOLT BASE TI HL 6X34X24 25D (Bolt) ×5 IMPLANT
BONE CANC CHIPS 40CC CAN1/2 (Bone Implant) ×10 IMPLANT
BUR BARREL STRAIGHT FLUTE 4.0 (BURR) IMPLANT
BUR MATCHSTICK NEURO 3.0 LAGG (BURR) ×5 IMPLANT
BUR PRECISION FLUTE 5.0 (BURR) ×5 IMPLANT
CAGE COROENT XL 8X18X40-10 (Cage) ×5 IMPLANT
CANISTER SUCT 3000ML PPV (MISCELLANEOUS) ×5 IMPLANT
CHIPS CANC BONE 40CC CAN1/2 (Bone Implant) ×8 IMPLANT
CLIP APPLIE 11 MED OPEN (CLIP) ×4 IMPLANT
CONNECTOR RELINE 30MM OPEN OFF (Connector) ×10 IMPLANT
CONT SPEC 4OZ CLIKSEAL STRL BL (MISCELLANEOUS) ×15 IMPLANT
COVER BACK TABLE 24X17X13 BIG (DRAPES) IMPLANT
COVER BACK TABLE 60X90IN (DRAPES) ×5 IMPLANT
DECANTER SPIKE VIAL GLASS SM (MISCELLANEOUS) ×5 IMPLANT
DERMABOND ADVANCED (GAUZE/BANDAGES/DRESSINGS) ×4
DERMABOND ADVANCED .7 DNX12 (GAUZE/BANDAGES/DRESSINGS) ×16 IMPLANT
DIGITIZER BENDINI (MISCELLANEOUS) ×5 IMPLANT
DRAPE C-ARM 42X72 X-RAY (DRAPES) ×20 IMPLANT
DRAPE C-ARMOR (DRAPES) ×5 IMPLANT
DRAPE INCISE IOBAN 66X45 STRL (DRAPES) IMPLANT
DRAPE LAPAROTOMY 100X72X124 (DRAPES) ×15 IMPLANT
DRAPE POUCH INSTRU U-SHP 10X18 (DRAPES) ×15 IMPLANT
DRAPE SCAN PATIENT (DRAPES) ×10 IMPLANT
DRAPE SURG 17X23 STRL (DRAPES) ×5 IMPLANT
DRSG OPSITE POSTOP 3X4 (GAUZE/BANDAGES/DRESSINGS) ×10 IMPLANT
DRSG OPSITE POSTOP 4X10 (GAUZE/BANDAGES/DRESSINGS) ×10 IMPLANT
DRSG OPSITE POSTOP 4X6 (GAUZE/BANDAGES/DRESSINGS) ×5 IMPLANT
DURAPREP 26ML APPLICATOR (WOUND CARE) ×15 IMPLANT
ELECT BLADE 4.0 EZ CLEAN MEGAD (MISCELLANEOUS) ×5
ELECT REM PT RETURN 9FT ADLT (ELECTROSURGICAL) ×15
ELECTRODE BLDE 4.0 EZ CLN MEGD (MISCELLANEOUS) ×4 IMPLANT
ELECTRODE REM PT RTRN 9FT ADLT (ELECTROSURGICAL) ×12 IMPLANT
EVACUATOR 1/8 PVC DRAIN (DRAIN) ×5 IMPLANT
GAUZE SPONGE 4X4 12PLY STRL (GAUZE/BANDAGES/DRESSINGS) IMPLANT
GAUZE SPONGE 4X4 16PLY XRAY LF (GAUZE/BANDAGES/DRESSINGS) ×10 IMPLANT
GLOVE BIO SURGEON STRL SZ 6.5 (GLOVE) ×5 IMPLANT
GLOVE BIO SURGEON STRL SZ8 (GLOVE) ×20 IMPLANT
GLOVE BIOGEL PI IND STRL 6.5 (GLOVE) ×4 IMPLANT
GLOVE BIOGEL PI IND STRL 7.5 (GLOVE) ×16 IMPLANT
GLOVE BIOGEL PI IND STRL 8 (GLOVE) ×20 IMPLANT
GLOVE BIOGEL PI IND STRL 8.5 (GLOVE) ×16 IMPLANT
GLOVE BIOGEL PI INDICATOR 6.5 (GLOVE) ×1
GLOVE BIOGEL PI INDICATOR 7.5 (GLOVE) ×4
GLOVE BIOGEL PI INDICATOR 8 (GLOVE) ×5
GLOVE BIOGEL PI INDICATOR 8.5 (GLOVE) ×4
GLOVE ECLIPSE 7.5 STRL STRAW (GLOVE) IMPLANT
GLOVE ECLIPSE 8.0 STRL XLNG CF (GLOVE) ×30 IMPLANT
GLOVE EXAM NITRILE LRG STRL (GLOVE) IMPLANT
GLOVE EXAM NITRILE MD LF STRL (GLOVE) IMPLANT
GLOVE EXAM NITRILE XL STR (GLOVE) IMPLANT
GLOVE EXAM NITRILE XS STR PU (GLOVE) IMPLANT
GLOVE SS BIOGEL STRL SZ 6.5 (GLOVE) ×4 IMPLANT
GLOVE SS BIOGEL STRL SZ 7 (GLOVE) ×4 IMPLANT
GLOVE SS BIOGEL STRL SZ 7.5 (GLOVE) ×4 IMPLANT
GLOVE SUPERSENSE BIOGEL SZ 6.5 (GLOVE) ×1
GLOVE SUPERSENSE BIOGEL SZ 7 (GLOVE) ×1
GLOVE SUPERSENSE BIOGEL SZ 7.5 (GLOVE) ×1
GOWN STRL NON-REIN LRG LVL3 (GOWN DISPOSABLE) IMPLANT
GOWN STRL REUS W/ TWL LRG LVL3 (GOWN DISPOSABLE) ×8 IMPLANT
GOWN STRL REUS W/ TWL XL LVL3 (GOWN DISPOSABLE) ×28 IMPLANT
GOWN STRL REUS W/TWL 2XL LVL3 (GOWN DISPOSABLE) ×25 IMPLANT
GOWN STRL REUS W/TWL LRG LVL3 (GOWN DISPOSABLE) ×2
GOWN STRL REUS W/TWL XL LVL3 (GOWN DISPOSABLE) ×7
HEMOSTAT POWDER KIT SURGIFOAM (HEMOSTASIS) ×5 IMPLANT
IMPLANT COROENT XL 8X45X18MM ×5 IMPLANT
INSERT FOGARTY 61MM (MISCELLANEOUS) IMPLANT
INSERT FOGARTY SM (MISCELLANEOUS) IMPLANT
KIT BASIN OR (CUSTOM PROCEDURE TRAY) ×5 IMPLANT
KIT DILATOR XLIF 5 (KITS) ×4 IMPLANT
KIT INFUSE X SMALL 1.4CC (Orthopedic Implant) ×10 IMPLANT
KIT POSITION SURG JACKSON T1 (MISCELLANEOUS) IMPLANT
KIT ROOM TURNOVER OR (KITS) ×5 IMPLANT
KIT SURGICAL ACCESS MAXCESS 4 (KITS) ×5 IMPLANT
KIT XLIF (KITS) ×1
LIQUID BAND (GAUZE/BANDAGES/DRESSINGS) IMPLANT
LOOP VESSEL MAXI BLUE (MISCELLANEOUS) IMPLANT
LOOP VESSEL MINI RED (MISCELLANEOUS) IMPLANT
MARKER SKIN DUAL TIP RULER LAB (MISCELLANEOUS) ×5 IMPLANT
MARKER SPHERE PSV REFLC 13MM (MARKER) ×25 IMPLANT
MILL MEDIUM DISP (BLADE) IMPLANT
MODULE NVM5 NEXT GEN EMG (NEEDLE) ×5 IMPLANT
MODULE POWER NUVASIVE (MISCELLANEOUS) ×4 IMPLANT
NEEDLE HYPO 21X1.5 SAFETY (NEEDLE) ×5 IMPLANT
NEEDLE HYPO 25X1 1.5 SAFETY (NEEDLE) ×10 IMPLANT
NEEDLE SPNL 18GX3.5 QUINCKE PK (NEEDLE) ×5 IMPLANT
NS IRRIG 1000ML POUR BTL (IV SOLUTION) ×5 IMPLANT
PACK LAMINECTOMY NEURO (CUSTOM PROCEDURE TRAY) ×15 IMPLANT
PAD ARMBOARD 7.5X6 YLW CONV (MISCELLANEOUS) ×30 IMPLANT
PAD SHARPS MAGNETIC DISPOSAL (MISCELLANEOUS) ×5 IMPLANT
PATTIES SURGICAL .5 X.5 (GAUZE/BANDAGES/DRESSINGS) IMPLANT
PATTIES SURGICAL .5 X1 (DISPOSABLE) IMPLANT
PATTIES SURGICAL 1X1 (DISPOSABLE) IMPLANT
POWER MODULE NUVASIVE (MISCELLANEOUS) ×5
PUTTY BONE ATTRAX 5CC STRIP (Putty) ×10 IMPLANT
ROD RELINE 5.5X500MM STRAIGHT (Rod) ×10 IMPLANT
SCREW LOCK RELINE 5.5 TULIP (Screw) ×85 IMPLANT
SCREW LOCK RELINE CLOSED TULIP (Screw) ×10 IMPLANT
SCREW RELINE-O 8.5X60MM CLOSED (Screw) ×10 IMPLANT
SCREW RELINE-O POLY 5.5X40 (Screw) ×25 IMPLANT
SCREW RELINE-O POLY 6.5X45 (Screw) ×40 IMPLANT
SCREW RELINE-O POLY 7.5X40 (Screw) ×10 IMPLANT
SPONGE INTESTINAL PEANUT (DISPOSABLE) ×10 IMPLANT
SPONGE LAP 18X18 X RAY DECT (DISPOSABLE) ×5 IMPLANT
SPONGE LAP 4X18 X RAY DECT (DISPOSABLE) IMPLANT
SPONGE SURGIFOAM ABS GEL 100 (HEMOSTASIS) ×5 IMPLANT
SPONGE SURGIFOAM ABS GEL SZ50 (HEMOSTASIS) IMPLANT
STAPLER SKIN PROX WIDE 3.9 (STAPLE) ×5 IMPLANT
STAPLER VISISTAT 35W (STAPLE) IMPLANT
STRIP CLOSURE SKIN 1/2X4 (GAUZE/BANDAGES/DRESSINGS) IMPLANT
SUT CHROMIC 4 0 RB 1X27 (SUTURE) ×5 IMPLANT
SUT MNCRL AB 4-0 PS2 18 (SUTURE) IMPLANT
SUT PDS AB 1 CTX 36 (SUTURE) ×5 IMPLANT
SUT PROLENE 4 0 RB 1 (SUTURE)
SUT PROLENE 4-0 RB1 .5 CRCL 36 (SUTURE) IMPLANT
SUT PROLENE 5 0 CC1 (SUTURE) IMPLANT
SUT PROLENE 6 0 C 1 30 (SUTURE) IMPLANT
SUT PROLENE 6 0 CC (SUTURE) IMPLANT
SUT SILK 0 TIES 10X30 (SUTURE) ×5 IMPLANT
SUT SILK 2 0 TIES 10X30 (SUTURE) IMPLANT
SUT SILK 2 0 TIES 17X18 (SUTURE) ×1
SUT SILK 2 0SH CR/8 30 (SUTURE) IMPLANT
SUT SILK 2-0 18XBRD TIE BLK (SUTURE) ×4 IMPLANT
SUT SILK 3 0 TIES 10X30 (SUTURE) IMPLANT
SUT SILK 3 0SH CR/8 30 (SUTURE) IMPLANT
SUT VIC AB 0 CT1 27 (SUTURE)
SUT VIC AB 0 CT1 27XBRD ANBCTR (SUTURE) IMPLANT
SUT VIC AB 1 CT1 18XBRD ANBCTR (SUTURE) ×32 IMPLANT
SUT VIC AB 1 CT1 8-18 (SUTURE) ×8
SUT VIC AB 2-0 CT1 18 (SUTURE) ×45 IMPLANT
SUT VIC AB 2-0 CT1 27 (SUTURE)
SUT VIC AB 2-0 CT1 27XBRD (SUTURE) IMPLANT
SUT VIC AB 2-0 CT1 36 (SUTURE) IMPLANT
SUT VIC AB 3-0 SH 27 (SUTURE)
SUT VIC AB 3-0 SH 27X BRD (SUTURE) IMPLANT
SUT VIC AB 3-0 SH 8-18 (SUTURE) ×30 IMPLANT
SUT VICRYL 4-0 PS2 18IN ABS (SUTURE) IMPLANT
SYR 3ML LL SCALE MARK (SYRINGE) IMPLANT
SYR 50ML SLIP (SYRINGE) ×5 IMPLANT
SYR 5ML LL (SYRINGE) IMPLANT
TAPE CLOTH 3X10 TAN LF (GAUZE/BANDAGES/DRESSINGS) ×15 IMPLANT
TOWEL OR 17X24 6PK STRL BLUE (TOWEL DISPOSABLE) ×10 IMPLANT
TOWEL OR 17X26 10 PK STRL BLUE (TOWEL DISPOSABLE) ×15 IMPLANT
TRAP SPECIMEN MUCOUS 40CC (MISCELLANEOUS) ×5 IMPLANT
TRAY FOLEY W/METER SILVER 16FR (SET/KITS/TRAYS/PACK) ×5 IMPLANT
TUBE CONNECTING 12X1/4 (SUCTIONS) ×5 IMPLANT
WATER STERILE IRR 1000ML POUR (IV SOLUTION) ×5 IMPLANT

## 2016-06-02 NOTE — Interval H&P Note (Signed)
History and Physical Interval Note:  06/02/2016 7:20 AM  [name]  has presented today for surgery, with the diagnosis of Thoracogenic scoliosis of thoracolumbar region; Spinal stenosis of lumbar region; Scoliosis, Idiopathic  The various methods of treatment have been discussed with the patient and family. After consideration of risks, benefits and other options for treatment, the patient has consented to  Procedure(s) with comments: L5-S1 Anterior lumbar interbody fusion with Dr. Donnetta Hutching for approach (N/A) - L5-S1 Anterior lumbar interbody fusion with Dr. Donnetta Hutching for approach L1-2 Anterior lateral interbody fusion (N/A) - L1-2 Anterior lateral interbody fusion T10-Pelvis pedicle screws/rods/bilaterally with exploration of fusion at L2-5 w/AIRO (Bilateral) - T10-Pelvis pedicle screws/rods/bilaterally with exploration of fusion at [id number] w/AIRO APPLICATION OF INTRAOPERATIVE CT SCAN (N/A) ABDOMINAL EXPOSURE (N/A) as a surgical intervention .  The patient's history has been reviewed, patient examined, no change in status, stable for surgery.  I have reviewed the patient's chart and labs.  Questions were answered to the patient's satisfaction.     [name] D

## 2016-06-02 NOTE — Op Note (Signed)
    OPERATIVE REPORT  DATE OF SURGERY: 06/02/2016  PATIENT: [name], 70 y.o. female MRN: [id number]  DOB: [birth year]  PRE-OPERATIVE DIAGNOSIS: Generative disc disease  POST-OPERATIVE DIAGNOSIS:  Same  PROCEDURE: Anterior exposure for L5-S1 disc surgery  SURGEON:  Curt Jews, M.D. co-surgeon for the exposure Dr. Dierdre Harness  PHYSICIAN ASSISTANT: Humberto Seals  ANESTHESIA:  Gen.  EBL: Minimal ml  Total I/O In: [id number] [I.V.:1000; Blood:685] Out: [id number] [Urine:1425; Blood:200]  BLOOD ADMINISTERED: None  DRAINS: None  SPECIMEN: None  COUNTS CORRECT:  YES  PLAN OF CARE: Lateral and posterior surgery dictated as a separate note by Dr. Vertell Limber   PATIENT DISPOSITION:  PACU - hemodynamically stable  PROCEDURE DETAILS: The patient was placed supine position where the area of the abdomen was prepped and draped in usual sterile fashion. Lateral C-arm projection reveal the level of the L5-S1 disc low in the pelvis. Incision was made from the midline to the left transversely and carried down through the subcutaneous fat with electrocautery. This was mobilized off the anterior rectus sheath. Anterior rectus sheath was opened in line with the skin incisions with Dr. cautery. The rectus muscle was mobilized circumferentially. The retroperitoneum space was entered bluntly and the left lower quadrant and the interpreter peritoneal contents were mobilized to the right. The left ureter was identified and was mobilized to the right. The patient had been extensively calcified iliac vessels. The L5-S1 disc was exposed. Blunt dissection over the disc gave adequate exposure to the right and left of the disc superiorly and inferiorly. Middle sacral vessels were clipped and divided for better exposure. The Thompson retractor was brought onto the field. The reverse lip 150 blades were positioned to the right and left of the disc. Malleable 140 blade was used for superior exposure and the 190 blade was used  for inferior exposure. C-arm was brought onto the field and needles placed in the L5-S1 disc to confirm the correct date disc space which was done. The remainder the procedure will be dictated as a separate note with Dr. Lilia Argue, M.D. 06/02/2016 12:48 PM

## 2016-06-02 NOTE — Anesthesia Preprocedure Evaluation (Addendum)
Anesthesia Evaluation  Patient identified by MRN, date of birth, ID band Patient awake  General Assessment Comment:No intubation difficulty noted on past OR record. Case discussed with patient. CE  Reviewed: Allergy & Precautions, NPO status , Patient's Chart, lab work & pertinent test results  Airway Mallampati: II  TM Distance: >3 FB Neck ROM: Full    Dental   Pulmonary    breath sounds clear to auscultation       Cardiovascular hypertension, + Peripheral Vascular Disease   Rhythm:Regular Rate:Normal     Neuro/Psych    GI/Hepatic Neg liver ROS, hiatal hernia, GERD  ,  Endo/Other  negative endocrine ROS  Renal/GU negative Renal ROS     Musculoskeletal  (+) Arthritis ,   Abdominal   Peds  Hematology   Anesthesia Other Findings   Reproductive/Obstetrics                            Anesthesia Physical Anesthesia Plan  ASA: III  Anesthesia Plan: General   Post-op Pain Management:    Induction: Intravenous  Airway Management Planned: Oral ETT  Additional Equipment: Arterial line  Intra-op Plan:   Post-operative Plan: Possible Post-op intubation/ventilation  Informed Consent: I have reviewed the patients History and Physical, chart, labs and discussed the procedure including the risks, benefits and alternatives for the proposed anesthesia with the patient or authorized representative who has indicated his/her understanding and acceptance.   Dental advisory given  Plan Discussed with: CRNA, Anesthesiologist and Surgeon  Anesthesia Plan Comments:        Anesthesia Quick Evaluation

## 2016-06-02 NOTE — Anesthesia Procedure Notes (Signed)
Procedure Name: Intubation Date/Time: 06/02/2016 7:55 AM Performed by: Shirlyn Goltz Pre-anesthesia Checklist: Patient identified, Emergency Drugs available, Suction available and Patient being monitored Patient Re-evaluated:Patient Re-evaluated prior to inductionOxygen Delivery Method: Circle system utilized Preoxygenation: Pre-oxygenation with 100% oxygen Intubation Type: IV induction Ventilation: Mask ventilation without difficulty Laryngoscope Size: Mac and 3 Grade View: Grade I Tube type: Subglottic suction tube Tube size: 7.5 mm Number of attempts: 1 Airway Equipment and Method: Stylet Placement Confirmation: ETT inserted through vocal cords under direct vision,  positive ETCO2 and breath sounds checked- equal and bilateral Secured at: 22 cm Tube secured with: Tape Dental Injury: Teeth and Oropharynx as per pre-operative assessment

## 2016-06-02 NOTE — OR Nursing (Signed)
No instruments seen on abdominal xray per Dr. Golden Circle, radiologist.

## 2016-06-02 NOTE — Op Note (Addendum)
06/02/2016  5:42 PM  PATIENT:  [name]  70 y.o. female  PRE-OPERATIVE DIAGNOSIS:  Thoracogenic scoliosis of thoracolumbar region; Spinal stenosis of lumbar region; Scoliosis, Idiopathic with lumbago and severe radiculopathy  POST-OPERATIVE DIAGNOSIS:  Thoracogenic scoliosis of thoracolumbar region; Spinal stenosis of lumbar region; Scoliosis, Idiopathic with lumbago and severe radiculopathy  PROCEDURE:  Procedure(s): Lumbar five-sacral one Anterior lumbar interbody fusion with Dr. Donnetta Hutching for approach (N/A) Exploration of Fusion Lumbar two-five, Thoracic ten-ileum posterior fusion, Posterolateral arthrodesis with AIRO (Bilateral) APPLICATION OF INTRAOPERATIVE CT SCAN (N/A) ABDOMINAL EXPOSURE (N/A) Thoracic twelve-Lumbar one, Lumbar one-two anterior Lateral Interbody Fusion  SURGEON:  Surgeon(s) and Role: Panel 1:    * Erline Levine, MD - Primary    * Kevan Ny Ditty, MD - Assisting  Panel 2:    * Rosetta Posner, MD - Primary  PHYSICIAN ASSISTANT:   ASSISTANTS: Poteat, RN   ANESTHESIA:   general  EBL:  Total I/O In: 3685 [I.V.:3000; Blood:685] Out: 2120 [Urine:1720; Blood:400]  BLOOD ADMINISTERED:2 Units PRBC  DRAINS: (Medium) Hemovact drain(s) in the paravertebral space with  Suction Open   LOCAL MEDICATIONS USED:  MARCAINE    and LIDOCAINE   SPECIMEN:  No Specimen  DISPOSITION OF SPECIMEN:  N/A  COUNTS:  YES  TOURNIQUET:  * No tourniquets in log *  DICTATION:   INDICATIONS:  [name] is 70 year old female with chronic and intractable back and bilateral lower extremity pain with marked disc degeneration and spondylosis who had previously undergone decompression and fusion L 2 - L 5 levels.  She developed a severe coronal deformity at L 12 and T 12 L 1 levels and was greater than 40 degrees out of balance.  It was elected to perform L 5 S 1 ALIF, then L 12 and T 12 L 1 XLIF, then exploration of L 2 - L 5 fusion with pedicle screw fixation T 10 - Ilium  bilaterally with posterolateral arthrodesis.   PROCEDURE:  The patient was placed in a supine on the operating table.  Doctor Early performed exposure and his portion of the procedure will be dictated separately. A localizing X ray was obtained with the C arm.  I then incised the anterior annulus and performed a thorough discectomy.  The endplates were cleared of disc and cartilagenous material and a thorough discectomy was performed with decompression of the ventral annulus and disc material.  I rdid a thorough discectomy and elected to place the graft eccentric to the left to correct her coronal deformity and fractional curve.  Because of her loss of lordosis we placed a 25 degree cage.   I then placed a 25 degree x 34 x 24 x 6 mm implant packed with extra small BMP and Attrax bone graft extender. Two 5.0 x 17.5 mm screws were then affixed to the S1 vertebra and one to the L 5 vertebra.  Locking mechanisms were engaged, soft tissues were inspected and found to be in good repair.  Retractors were removed.  Fascia was closed with 1 PDS running stitch, skin edges closed with 2-0 and 3-0 vicryl sutures.  Wound was dressed with a sterile occlusive dressing.   The patient was then placed in a left lateral decubitus position on the operative table and using orthogonally projected C-arm fluoroscopy the patient was placed so that the T 12 L 1 and L 12 levels were visualized in AP and lateral plane. The patient was then taped into position. The table was flexed so as  to expose these scoliotic levels. Skin was marked along with a posterior finger dissection incision. Her flank was then prepped and draped in usual sterile fashion and a single incision were made sequentially overlying the L 12 level. Posterior finger dissection was made to enter the retroperitoneal space and then subsequently the probe was inserted into the psoas muscle from the right (concave) side initially at the L 12 level. After mapping the neural  elements were able to dock the probe per the midpoint of this vertebral level and without indications electrically of too close proximity to the neural tissues. Subsequently the self-retaining tractor was.after sequential dilators were utilized the shim was employed and the interspace was cleared of psoas muscle and then incised. A thorough discectomy was performed. Angled instruments were used to clear the interspace of disc material. After thorough discectomy was performed and this was performed using AP and lateral fluoroscopy an 8 lordotic by 45 x 18 mm implant was packed with extra small BMP and Attrax with autologous blood. This was tamped into position using the slides and its position was confirmed on AP and lateral fluoroscopy. Subsequently exposure was performed at the T 12 L 1 level and similar dissection was performed with locking of the self-retaining retractor. At this level were able to place an 8 lordotic by 18 x 40 mm implant packed in a similar fashion.  Hemostasis was assured the wounds were irrigated interrupted Vicryl sutures and dressed with Dermabond and occlusive dressings.   Patient was then turned prone on the OR table for Airo intraoperative CT scanner and, after prepping and draping her low back, the incision was opened and the previously placed hardware was exposed from L 2 - L 5 levels. Exposure was carried from T 9 through both iliac crests, with separate fascial insions at each crest.  The rods and connectors were removed from L 2, L 3 , L 4, L 5 levels along with cross connector.  The bone mass was inspected and there was found to be good bone growth.  There was no loosening of hardware.  Screws were replaced at L 2, L 3, L 4, L 5 (6.5 x 45 mm at each level).  The iliac crests on both sides were exposed and pelvis and laminae of S1 were cleared of investing soft tissue.The spinous process clamp and array were placed at T 9 and a CT scan was obtained.   Subsequently, a 6.5 x 40 mm  screw was placed at S 1 on both sides.  8.5 x 60 mm iliac screws were placed on each side using navigation. I elected to reposition these screws as they were anterior and placed them with more posterior entry points.  There was good trajectory and no cutouts.  3 cm connectors were placed on these screws. I then placed screws at T 10 and T 11 and the left T 12 level (5.5 x 40 mm bilaterally and left T 12).  A second CT was obtained, which confirmed good positioning of all hardware.  Rods and connectors were attached using Bendini rod bending system (5.5 x 260 mm left and 5.5 x 260 mm right).   80 cc of allograft chips along with autograft harvested from the pelvis. This was placed over the decorticated thoracolumbar spine.   A medium Hemovac drain was placed and long-acting Marcaine was injected in the musculature.  The incision was closed with 1, 2-0, 3-0 vicryl sutures.  A sterile occlusive dressing was placed.   Patient  was extubated in the OR and taken to recovery having tolerated her surgery well.  Counts were correct at the end of the case.  PLAN OF CARE: Admit to inpatient   PATIENT DISPOSITION:  PACU - hemodynamically stable.   Delay start of Pharmacological VTE agent (>24hrs) due to surgical blood loss or risk of bleeding: yes   Pelvic Parameters PI 79, LL 39 pre-op to 68 intraop, PI-LL 39 preop to 10 mm Intraop.

## 2016-06-02 NOTE — Transfer of Care (Signed)
Immediate Anesthesia Transfer of Care Note  Patient: [name]  Procedure(s) Performed: Procedure(s): Lumbar five-sacral one Anterior lumbar interbody fusion with Dr. Donnetta Hutching for approach (N/A) Exploration of Fusion Lumbar two-five, Thoracic ten-ileum posterior fusion, Posterolateral arthrodesis with AIRO (Bilateral) APPLICATION OF INTRAOPERATIVE CT SCAN (N/A) ABDOMINAL EXPOSURE (N/A) Thoracic twelve-Lumbar one, Lumbar one-two anterior Lateral Interbody Fusion  Patient Location: PACU  Anesthesia Type:General  Level of Consciousness: awake, alert , oriented and patient cooperative  Airway & Oxygen Therapy: Patient Spontanous Breathing and Patient connected to nasal cannula oxygen  Post-op Assessment: Report given to RN and Post -op Vital signs reviewed and stable  Post vital signs: Reviewed and stable  Last Vitals:  Filed Vitals:   06/02/16 0628  BP: 184/84  Pulse: 70  Resp: 18    Last Pain:  Filed Vitals:   06/02/16 0700  PainSc: 5       Patients Stated Pain Goal: 5 ([id number])  Complications: No apparent anesthesia complications

## 2016-06-02 NOTE — Progress Notes (Signed)
Awake, alert, conversant.  Full strength in all lower extremity groups with improved right hip flexor strength.  Doing well.

## 2016-06-02 NOTE — Anesthesia Postprocedure Evaluation (Signed)
Anesthesia Post Note  Patient: [name]  Procedure(s) Performed: Procedure(s) (LRB): Lumbar five-sacral one Anterior lumbar interbody fusion with Dr. Donnetta Hutching for approach (N/A) Exploration of Fusion Lumbar two-five, Thoracic ten-ileum posterior fusion, Posterolateral arthrodesis with AIRO (Bilateral) APPLICATION OF INTRAOPERATIVE CT SCAN (N/A) ABDOMINAL EXPOSURE (N/A) Thoracic twelve-Lumbar one, Lumbar one-two anterior Lateral Interbody Fusion  Patient location during evaluation: ICU Anesthesia Type: General Level of consciousness: awake and alert Pain management: pain level controlled Vital Signs Assessment: post-procedure vital signs reviewed and stable Respiratory status: spontaneous breathing, nonlabored ventilation, respiratory function stable and patient connected to nasal cannula oxygen Cardiovascular status: blood pressure returned to baseline and stable Postop Assessment: no signs of nausea or vomiting Anesthetic complications: no    Last Vitals:  Filed Vitals:   06/02/16 1855 06/02/16 1900  BP: 141/89 155/125  Pulse: 80 87  Temp:    Resp: 12 13    Last Pain:  Filed Vitals:   06/02/16 1902  PainSc: 5                  Tiajuana Amass

## 2016-06-02 NOTE — Brief Op Note (Addendum)
06/02/2016  5:42 PM  PATIENT:  [name]  70 y.o. female  PRE-OPERATIVE DIAGNOSIS:  Thoracogenic scoliosis of thoracolumbar region; Spinal stenosis of lumbar region; Scoliosis, Idiopathic with lumbago and severe radiculopathy  POST-OPERATIVE DIAGNOSIS:  Thoracogenic scoliosis of thoracolumbar region; Spinal stenosis of lumbar region; Scoliosis, Idiopathic with lumbago and severe radiculopathy  PROCEDURE:  Procedure(s): Lumbar five-sacral one Anterior lumbar interbody fusion with Dr. Donnetta Hutching for approach (N/A) Exploration of Fusion Lumbar two-five, Thoracic ten-ileum posterior fusion, Posterolateral arthrodesis with AIRO (Bilateral) APPLICATION OF INTRAOPERATIVE CT SCAN (N/A) ABDOMINAL EXPOSURE (N/A) Thoracic twelve-Lumbar one, Lumbar one-two anterior Lateral Interbody Fusion  SURGEON:  Surgeon(s) and Role: Panel 1:    * Erline Levine, MD - Primary    * Kevan Ny Ditty, MD - Assisting  Panel 2:    * Rosetta Posner, MD - Primary  PHYSICIAN ASSISTANT:   ASSISTANTS: Poteat, RN   ANESTHESIA:   general  EBL:  Total I/O In: 3685 [I.V.:3000; Blood:685] Out: 2120 [Urine:1720; Blood:400]  BLOOD ADMINISTERED:2 Units PRBC  DRAINS: (Medium) Hemovact drain(s) in the paravertebral space with  Suction Open   LOCAL MEDICATIONS USED:  MARCAINE    and LIDOCAINE   SPECIMEN:  No Specimen  DISPOSITION OF SPECIMEN:  N/A  COUNTS:  YES  TOURNIQUET:  * No tourniquets in log *  DICTATION:   INDICATIONS:  [name] is 70 year old female with chronic and intractable back and bilateral lower extremity pain with marked disc degeneration and spondylosis who had previously undergone decompression and fusion L 2 - L 5 levels.  She developed a severe coronal deformity at L 12 and T 12 L 1 levels and was greater than 40 degrees out of balance.  It was elected to perform L 5 S 1 ALIF, then L 12 and T 12 L 1 XLIF, then exploration of L 2 - L 5 fusion with pedicle screw fixation T 10 - Ilium  bilaterally with posterolateral arthrodesis.   PROCEDURE:  The patient was placed in a supine on the operating table.  Doctor Early performed exposure and his portion of the procedure will be dictated separately. A localizing X ray was obtained with the C arm.  I then incised the anterior annulus and performed a thorough discectomy.  The endplates were cleared of disc and cartilagenous material and a thorough discectomy was performed with decompression of the ventral annulus and disc material.  I rdid a thorough discectomy and elected to place the graft eccentric to the left to correct her coronal deformity and fractional curve.  Because of her loss of lordosis we placed a 25 degree cage.   I then placed a 25 degree x 34 x 24 x 6 mm implant packed with extra small BMP and Attrax bone graft extender. Two 5.0 x 17.5 mm screws were then affixed to the S1 vertebra and one to the L 5 vertebra.  Locking mechanisms were engaged, soft tissues were inspected and found to be in good repair.  Retractors were removed.  Fascia was closed with 1 PDS running stitch, skin edges closed with 2-0 and 3-0 vicryl sutures.  Wound was dressed with a sterile occlusive dressing.   The patient was then placed in a left lateral decubitus position on the operative table and using orthogonally projected C-arm fluoroscopy the patient was placed so that the T 12 L 1 and L 12 levels were visualized in AP and lateral plane. The patient was then taped into position. The table was flexed so as  to expose these scoliotic levels. Skin was marked along with a posterior finger dissection incision. Her flank was then prepped and draped in usual sterile fashion and a single incision were made sequentially overlying the L 12 level. Posterior finger dissection was made to enter the retroperitoneal space and then subsequently the probe was inserted into the psoas muscle from the right (concave) side initially at the L 12 level. After mapping the neural  elements were able to dock the probe per the midpoint of this vertebral level and without indications electrically of too close proximity to the neural tissues. Subsequently the self-retaining tractor was.after sequential dilators were utilized the shim was employed and the interspace was cleared of psoas muscle and then incised. A thorough discectomy was performed. Angled instruments were used to clear the interspace of disc material. After thorough discectomy was performed and this was performed using AP and lateral fluoroscopy an 8 lordotic by 45 x 18 mm implant was packed with extra small BMP and Attrax with autologous blood. This was tamped into position using the slides and its position was confirmed on AP and lateral fluoroscopy. Subsequently exposure was performed at the T 12 L 1 level and similar dissection was performed with locking of the self-retaining retractor. At this level were able to place an 8 lordotic by 18 x 40 mm implant packed in a similar fashion.  Hemostasis was assured the wounds were irrigated interrupted Vicryl sutures and dressed with Dermabond and occlusive dressings.   Patient was then turned prone on the OR table for Airo intraoperative CT scanner and, after prepping and draping her low back, the incision was opened and the previously placed hardware was exposed from L 2 - L 5 levels. Exposure was carried from T 9 through both iliac crests, with separate fascial insions at each crest.  The rods and connectors were removed from L 2, L 3 , L 4, L 5 levels along with cross connector.  The bone mass was inspected and there was found to be good bone growth.  There was no loosening of hardware.  Screws were replaced at L 2, L 3, L 4, L 5 (6.5 x 45 mm at each level).  The iliac crests on both sides were exposed and pelvis and laminae of S1 were cleared of investing soft tissue.The spinous process clamp and array were placed at T 9 and a CT scan was obtained.   Subsequently, a 6.5 x 40 mm  screw was placed at S 1 on both sides.  8.5 x 60 mm iliac screws were placed on each side using navigation. I elected to reposition these screws as they were anterior and placed them with more posterior entry points.  There was good trajectory and no cutouts.  3 cm connectors were placed on these screws. I then placed screws at T 10 and T 11 and the left T 12 level (5.5 x 40 mm bilaterally and left T 12).  A second CT was obtained, which confirmed good positioning of all hardware.  Rods and connectors were attached using Bendini rod bending system (5.5 x 260 mm left and 5.5 x 260 mm right).   80 cc of allograft chips along with autograft harvested from the pelvis. This was placed over the decorticated thoracolumbar spine.   A medium Hemovac drain was placed and long-acting Marcaine was injected in the musculature.  The incision was closed with 1, 2-0, 3-0 vicryl sutures.  A sterile occlusive dressing was placed.   Patient  was extubated in the OR and taken to recovery having tolerated her surgery well.  Counts were correct at the end of the case.  PLAN OF CARE: Admit to inpatient   PATIENT DISPOSITION:  PACU - hemodynamically stable.   Delay start of Pharmacological VTE agent (>24hrs) due to surgical blood loss or risk of bleeding: yes  Pelvic Parameters PI 79, LL 39 pre-op to 68 intraop, PI-LL 39 preop to 10 mm Intraop.

## 2016-06-03 DIAGNOSIS — R7989 Other specified abnormal findings of blood chemistry: Secondary | ICD-10-CM

## 2016-06-03 LAB — BASIC METABOLIC PANEL
Anion gap: 7 (ref 5–15)
BUN: 12 mg/dL (ref 6–20)
CHLORIDE: 101 mmol/L (ref 101–111)
CO2: 21 mmol/L — ABNORMAL LOW (ref 22–32)
CREATININE: 0.68 mg/dL (ref 0.44–1.00)
Calcium: 7.8 mg/dL — ABNORMAL LOW (ref 8.9–10.3)
GFR calc Af Amer: >60 mL/min (ref >60)
GFR calc non Af Amer: >60 mL/min (ref >60)
GLUCOSE: 125 mg/dL — AB (ref 65–99)
POTASSIUM: 3.4 mmol/L — AB (ref 3.5–5.1)
SODIUM: 129 mmol/L — AB (ref 135–145)

## 2016-06-03 LAB — CBC WITH DIFFERENTIAL/PLATELET
BASOS ABS: 0 10*3/uL (ref 0.0–0.1)
Basophils Relative: 0 %
Eosinophils Absolute: 0 10*3/uL (ref 0.0–0.7)
Eosinophils Relative: 0 %
HEMATOCRIT: 25.8 % — AB (ref 36.0–46.0)
Hemoglobin: 8.5 g/dL — ABNORMAL LOW (ref 12.0–15.0)
LYMPHS ABS: 1.1 10*3/uL (ref 0.7–4.0)
LYMPHS PCT: 13 %
MCH: 26.8 pg (ref 26.0–34.0)
MCHC: 32.9 g/dL (ref 30.0–36.0)
MCV: 81.4 fL (ref 78.0–100.0)
MONOS PCT: 11 %
Monocytes Absolute: 1 10*3/uL (ref 0.1–1.0)
Neutro Abs: 6.7 10*3/uL (ref 1.7–7.7)
Neutrophils Relative %: 76 %
Platelets: 187 10*3/uL (ref 150–400)
RBC: 3.17 MIL/uL — AB (ref 3.87–5.11)
RDW: 15.2 % (ref 11.5–15.5)
WBC: 8.8 10*3/uL (ref 4.0–10.5)

## 2016-06-03 LAB — TROPONIN I
TROPONIN I: 0.07 ng/mL — AB (ref <0.031)
TROPONIN I: 0.09 ng/mL — AB (ref <0.031)
TROPONIN I: 0.08 ng/mL — AB (ref <0.031)
Troponin I: 0.18 ng/mL — ABNORMAL HIGH (ref <0.031)

## 2016-06-03 MED ORDER — SODIUM CHLORIDE 0.9 % IV BOLUS (SEPSIS)
500.0000 mL | Freq: Once | INTRAVENOUS | Status: AC
  Administered 2016-06-03: 500 mL via INTRAVENOUS

## 2016-06-03 MED ORDER — ACETAMINOPHEN 10 MG/ML IV SOLN
1000.0000 mg | Freq: Four times a day (QID) | INTRAVENOUS | Status: AC
  Administered 2016-06-03 – 2016-06-04 (×4): 1000 mg via INTRAVENOUS
  Filled 2016-06-03 (×4): qty 100

## 2016-06-03 MED ORDER — POTASSIUM CHLORIDE IN NACL 20-0.9 MEQ/L-% IV SOLN
INTRAVENOUS | Status: DC
  Administered 2016-06-03 – 2016-06-04 (×2): via INTRAVENOUS
  Filled 2016-06-03 (×8): qty 1000

## 2016-06-03 MED ORDER — HYDROCODONE-ACETAMINOPHEN 7.5-325 MG/15ML PO SOLN
15.0000 mL | ORAL | Status: DC | PRN
  Administered 2016-06-09 (×4): 15 mL via ORAL
  Filled 2016-06-03 (×5): qty 15

## 2016-06-03 MED ORDER — FAMOTIDINE 40 MG/5ML PO SUSR
20.0000 mg | Freq: Every day | ORAL | Status: DC
  Administered 2016-06-04 – 2016-06-09 (×6): 20 mg via ORAL
  Filled 2016-06-03 (×6): qty 2.5

## 2016-06-03 MED ORDER — OXYCODONE HCL 5 MG/5ML PO SOLN
5.0000 mg | ORAL | Status: AC | PRN
  Administered 2016-06-03 – 2016-06-06 (×11): 5 mg via ORAL
  Filled 2016-06-03 (×14): qty 5

## 2016-06-03 NOTE — Consult Note (Signed)
Referring Physician:  Primary Physician: Primary Cardiologist: none Reason for Consultation: elevated troponin, chest pain   HPI: Patient is a 70 yo CA F with h/o multiple autoimmune diseases (including polymyositis and Sjogren'son chronic steroids), hypertension, and thoracolumbar scoliosis s/p extensive fusion lumbar fusion on 06/02/16.  Cardiology was consulted, because yesterday evening, after the procedure, patient had a 2-3 minute episode of right-sided chest pressure.  The symptoms resolved spontaneously and were in the setting of hypotension and her pain medication being held.  She was given stress-dose steroids. Troponins were checked and have been mildly elevated, most recently at 0.18.  Since last night patient has been chest pain free.  She denies any shortness of breath, radiation, nausea, or vomiting.  Her only complaints this evening are constipation and pain at her surgical site.  Prior to her procedure, she felt well and denied history of chest pain, shortness of breath, or palpitations.  She was active at home.  She believes she had a stress test many years ago and it was normal.  Review of Systems:     Cardiac Review of Systems: {Y] = yes [ ]  = no  Chest Pain [ y   ]  Resting SOB [ n  ] Exertional SOB  [ n]  Orthopnea [ n ]   Pedal Edema n[   ]    Palpitations [n  ] Syncope  [ n ]   Presyncope [ n  ]  General Review of Systems: [Y] = yes [  ]=no Constitional: recent weight change [  ]; anorexia [  ]; fatigue [  ]; nausea [  ]; night sweats [  ]; fever [  ]; or chills [  ];                                                                     Eyes : blurred vision [  ]; diplopia [   ]; vision changes [  ];  Amaurosis fugax[  ]; Resp: cough [ y ];  wheezing[n  ];  hemoptysis[ n ];  PND [  ];  GI:  gallstones[  ], vomiting[  ];  dysphagia[  ]; melena[  ];  hematochezia [  ]; heartburn[  ];   GU: kidney stones [  ]; hematuria[  ];   dysuria [  ];  nocturia[  ]; incontinence [   ];             Skin: rash, swelling[  ];, hair loss[  ];  peripheral edema[  ];  or itching[  ]; Musculosketetal: myalgias[y  ];  joint swelling[  ];  joint erythema[  ];  joint pain[ y ];  back pain[ y ];  Heme/Lymph: bruising[  ];  bleeding[  ];  anemia[  ];  Neuro: TIA[  ];  headaches[  ];  stroke[ n ];  vertigo[  ];  seizures[  ];   paresthesias[  ];  difficulty walking[  ];  Psych:depression[  ]; anxiety[  ];  Endocrine: diabetes[n  ];  thyroid dysfunction[  ];  Other:  Past Medical History  Diagnosis Date  . HTN (hypertension)   . Glaucoma   . Sjogren's syndrome ([place])   . Raynaud phenomenon   .  Polymyositis ([place])   . GERD (gastroesophageal reflux disease)   . Swallowing difficulty     unable to swallow whole pills  . Iron deficiency anemia     hx  . High cholesterol     "not severe; can't take the medicine" (09/08/2013)  . Multiple thyroid nodules   . Migraines     "hurt like a migraine; don't get them like I did when I was younger" (09/08/2013)  . OA (osteoarthritis)   . Chronic lower back pain     "for 6 years; relieved since back OR 07/2013" (09/08/2013)  . Swelling of left lower extremity   . Pancreatitis   . Diverticulosis   . Shingles   . Osteoporosis   . Difficult intubation     "ive been told im small", no problems listed with back sx in past  . Family history of anesthesia complication     "sister has severe sleep apnea; she has alot of trouble w/anesthesia" (09/08/2013)  . AAA (abdominal aortic aneurysm) ([place])     small      report in epic 11/11 states no aaa    . History of hiatal hernia     "small one"  . 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 PHN (postherpetic neuralgia)     right side from shingles    Medications Prior to Admission  Medication Sig Dispense Refill  . acetaminophen (TYLENOL) 160 MG/5ML elixir Take 500 mg by mouth every 4 (four) hours as needed for fever. Reported on 05/16/2016    . Calcium-Vitamin D-Vitamin K (VIACTIV PO) Take 2 each by  mouth daily. Reported on 05/16/2016    . Cholecalciferol (VITAMIN D3) 5000 UNIT/ML LIQD Take 5,000 Units by mouth daily. Reported on 05/16/2016    . diclofenac (VOLTAREN) 75 MG EC tablet Take 75 mg by mouth 2 (two) times daily.    . diphenhydrAMINE (BENADRYL) 12.5 MG/5ML elixir Take 6.25 mg by mouth 4 (four) times daily as needed for sleep.    Marland Kitchen FIBER SELECT GUMMIES CHEW Chew 2 each by mouth daily.     . hydroxychloroquine (PLAQUENIL) 200 MG tablet Take 200 mg by mouth daily. Reported on 05/16/2016    . magnesium hydroxide (MILK OF MAGNESIA) 400 MG/5ML suspension Take 15 mLs by mouth daily as needed for mild constipation or heartburn. Reported on 05/16/2016    . omeprazole (PRILOSEC) 20 MG capsule Take 1 capsule (20 mg total) by mouth daily. Take one 30 min prior to breakfast. 30 capsule 3  . predniSONE (DELTASONE) 5 MG tablet Take 5 mg by mouth daily with breakfast.    . Tetrahydrozoline HCl (VISINE OP) Place 1 drop into both eyes daily as needed (dry eyes). Reported on 05/16/2016    . timolol (BETIMOL) 0.5 % ophthalmic solution Place 1 drop into both eyes at bedtime.    . traMADol (ULTRAM) 50 MG tablet Take 50 mg by mouth every morning.     . triamterene-hydrochlorothiazide (MAXZIDE-25) 37.5-25 MG tablet Take 1 tablet by mouth daily. Reported on 05/16/2016  0  . guaiFENesin (MUCINEX) 600 MG 12 hr tablet Take 600 mg by mouth 2 (two) times daily as needed for cough.    Marland Kitchen HYDROcodone-acetaminophen (HYCET) 7.5-325 mg/15 ml solution Take 10 mLs by mouth every 6 (six) hours as needed. pain  0  . methylPREDNISolone sodium succinate (SOLU-MEDROL) 1000 MG injection Inject 1,000 mg into the vein once. Reported on 05/16/2016       . acetaminophen  1,000 mg Intravenous Q6H  .  cholecalciferol  5,000 Units Oral Daily  . docusate sodium  100 mg Oral BID  . [START ON 06/04/2016] famotidine  20 mg Oral Daily  . hydroxychloroquine  200 mg Oral Daily  . predniSONE  5 mg Oral Q breakfast  . sodium chloride flush  3  mL Intravenous Q12H  . timolol  1 drop Both Eyes QHS  . traMADol  50 mg Oral q morning - 10a  . triamterene-hydrochlorothiazide  1 tablet Oral Daily    Infusions: . sodium chloride    . 0.9 % NaCl with KCl 20 mEq / L 100 mL/hr at 06/03/16 1900    Allergies  Allergen Reactions  . Phenylephrine     Other reaction(s): Other (See Comments) -- phenylephrine eye drops only  Eye irritation, severe photophobia    Social History   Social History  . Marital Status: Widowed    Spouse Name: N/A  . Number of Children: N/A  . Years of Education: N/A   Occupational History  . Not on file.   Social History Main Topics  . Smoking status: Never Smoker   . Smokeless tobacco: Never Used  . Alcohol Use: No  . Drug Use: No  . Sexual Activity: No     Comment: 1st intercourse 10 yo-1 partner   Other Topics Concern  . Not on file   Social History Narrative    Family History  Problem Relation Age of Onset  . Ovarian cancer Mother   . Hypertension Father   . Heart disease Father   . Breast cancer Father     Age 52  . Colon cancer Father 8  . Psoriasis Sister   . Sarcoidosis Sister   . Arthritis Sister     psoriatic arthritis  . Diabetes Sister   . Arthritis Maternal Uncle     rheumatiod arthritis  . Diabetes Son   . Heart disease Brother     PHYSICAL EXAM: Filed Vitals:   06/03/16 2200 06/03/16 2300  BP: 122/67 120/77  Pulse: 59 87  Temp:    Resp: 20 17     Intake/Output Summary (Last 24 hours) at 06/03/16 2337 Last data filed at 06/03/16 2300  Gross per 24 hour  Intake 3713.75 ml  Output    650 ml  Net 3063.75 ml    General:  Well appearing. No respiratory difficulty HEENT: normal Neck: supple. no JVD. No lymphadenopathy or thryomegaly appreciated. Cor: PMI nondisplaced. Regular rate & rhythm. No rubs, gallops or murmurs. Fixed s2 Lungs: clear, anteriorly Abdomen: soft, nontender, nondistended. No hepatosplenomegaly. No bruits or masses. Hypoactive bowel  sounds. Extremities: no cyanosis, clubbing, rash, edema. 2+ distal pulses throughout Neuro: alert & oriented x 3, cranial nerves grossly intact. moves all 4 extremities w/o difficulty. Affect pleasant.  ECG: NSR, non-specific lateral ST changes, largely unchanged from previous in 11/16  Results for orders placed or performed during the hospital encounter of 06/02/16 (from the past 24 hour(s))  Troponin I     Status: Abnormal   Collection Time: 06/03/16  2:20 AM  Result Value Ref Range   Troponin I 0.07 (H) <0.031 ng/mL  Troponin I     Status: Abnormal   Collection Time: 06/03/16 10:58 AM  Result Value Ref Range   Troponin I 0.09 (H) <0.031 ng/mL  Basic metabolic panel     Status: Abnormal   Collection Time: 06/03/16 10:58 AM  Result Value Ref Range   Sodium 129 (L) 135 - 145 mmol/L   Potassium 3.4 (  L) 3.5 - 5.1 mmol/L   Chloride 101 101 - 111 mmol/L   CO2 21 (L) 22 - 32 mmol/L   Glucose, Bld 125 (H) 65 - 99 mg/dL   BUN 12 6 - 20 mg/dL   Creatinine, Ser 0.68 0.44 - 1.00 mg/dL   Calcium 7.8 (L) 8.9 - 10.3 mg/dL   GFR calc non Af Amer >60 >60 mL/min   GFR calc Af Amer >60 >60 mL/min   Anion gap 7 5 - 15  CBC with Differential/Platelet     Status: Abnormal   Collection Time: 06/03/16 10:58 AM  Result Value Ref Range   WBC 8.8 4.0 - 10.5 K/uL   RBC 3.17 (L) 3.87 - 5.11 MIL/uL   Hemoglobin 8.5 (L) 12.0 - 15.0 g/dL   HCT 25.8 (L) 36.0 - 46.0 %   MCV 81.4 78.0 - 100.0 fL   MCH 26.8 26.0 - 34.0 pg   MCHC 32.9 30.0 - 36.0 g/dL   RDW 15.2 11.5 - 15.5 %   Platelets 187 150 - 400 K/uL   Neutrophils Relative % 76 %   Lymphocytes Relative 13 %   Monocytes Relative 11 %   Eosinophils Relative 0 %   Basophils Relative 0 %   Neutro Abs 6.7 1.7 - 7.7 K/uL   Lymphs Abs 1.1 0.7 - 4.0 K/uL   Monocytes Absolute 1.0 0.1 - 1.0 K/uL   Eosinophils Absolute 0.0 0.0 - 0.7 K/uL   Basophils Absolute 0.0 0.0 - 0.1 K/uL   WBC Morphology TOXIC GRANULATION   Troponin I     Status: Abnormal    Collection Time: 06/03/16  3:54 PM  Result Value Ref Range   Troponin I 0.18 (H) <0.031 ng/mL  Troponin I     Status: Abnormal   Collection Time: 06/03/16  8:45 PM  Result Value Ref Range   Troponin I 0.08 (H) <0.031 ng/mL   Dg Lumbar Spine Complete  06/02/2016  CLINICAL DATA:  Lumbar spine surgery, L1-L2 XL IF, L5-S1 ALI EXAM: DG C-ARM 61-120 MIN; LUMBAR SPINE - COMPLETE 4+ VIEW COMPARISON:  Intraoperative images compared to preoperative MR 02/17/2016 FLUOROSCOPY TIME:  3 minutes 21 seconds Images obtained: 4 FINDINGS: Prior MR labeled with 5 lumbar vertebra. Prior posterior fusion of L2-L5 with BILATERAL pedicle screws and posterior bars. Disc prostheses present at T12-L1 and L1-L2. Interval anterior fusion of L5-S1. Bones significantly demineralized. Vertebral body heights maintained. IMPRESSION: New disc prostheses at T12-L1 and L1-L2 with additional anterior hardware at L5-S1. Old posterior fusion L2-L5. Electronically Signed   By: Lavonia Dana M.D.   On: 06/02/2016 17:22   Dg C-arm 61-120 Min  06/02/2016  CLINICAL DATA:  Lumbar spine surgery, L1-L2 XL IF, L5-S1 ALI EXAM: DG C-ARM 61-120 MIN; LUMBAR SPINE - COMPLETE 4+ VIEW COMPARISON:  Intraoperative images compared to preoperative MR 02/17/2016 FLUOROSCOPY TIME:  3 minutes 21 seconds Images obtained: 4 FINDINGS: Prior MR labeled with 5 lumbar vertebra. Prior posterior fusion of L2-L5 with BILATERAL pedicle screws and posterior bars. Disc prostheses present at T12-L1 and L1-L2. Interval anterior fusion of L5-S1. Bones significantly demineralized. Vertebral body heights maintained. IMPRESSION: New disc prostheses at T12-L1 and L1-L2 with additional anterior hardware at L5-S1. Old posterior fusion L2-L5. Electronically Signed   By: Lavonia Dana M.D.   On: 06/02/2016 17:22   Dg Ct Oarm Limited Study  06/02/2016  CLINICAL DATA:  Intra-op CT was utilized by requesting physician. No radiographic interpretation.   Dg Or Local Abdomen  06/02/2016  CLINICAL DATA:  Status post ALIF, no instrument count EXAM: OR LOCAL ABDOMEN COMPARISON:  02/17/2016 FINDINGS: Postsurgical changes are again seen from L2 to L5 and stable. New anterior fixation is noted at L5-S1. No retained foreign body is noted. IMPRESSION: Postoperative changes are noted. No radiopaque foreign body is noted. These results were called by telephone at the time of interpretation on 06/02/2016 at 10:22 am to [facility] in [facility] OR 4, who verbally acknowledged these results. Electronically Signed   By: Inez Catalina M.D.   On: 06/02/2016 10:22     ASSESSMENT/PLAN:  1. Elevated troponin/Chest pain. Likely demand ischemia in the setting of peri-operative hypotension and anemia. Troponins have already peaked at 0.18 and are now down to 0.08 this evening. Patients with polymyositis may have elevated CK and troponin levels, but they are also at increased risk of coronary disease, so ACS could be a possibility, but is far less likely. -- repeat EKG  -- perform transthoracic echocardiogram -- anticoagulation not indicated at this time and would be very high-risk from a surgical perspective, per neurosurgical team  -- patient can follow-up with previous cardiologist as an outpatient,  -- unless patient has recurrent symptoms, concerning EKG changes, or an abnormal echocardiogram, there is no indication for ischemic evaluation at this time -- consider starting asa 81 mg, if not contraindicated from a surgical perspective -- check lipids and HgbA1c

## 2016-06-03 NOTE — Progress Notes (Signed)
Patient ID: [name], female   DOB: 07/27/1946, 70 y.o.   MRN: [id number] Subjective:  The patient is alert and pleasant. Her back is appropriately sore. She denies chest pain.  Objective: Vital signs in last 24 hours: Temp:  [97.3 F (36.3 C)-98.3 F (36.8 C)] 98.3 F (36.8 C) (06/17 0400) Pulse Rate:  [43-110] 87 (06/17 0600) Resp:  [11-31] 16 (06/17 0600) BP: (83-155)/(55-125) 93/69 mmHg (06/17 0600) SpO2:  [92 %-100 %] 100 % (06/17 0600) Arterial Line BP: (93-170)/(41-85) 112/41 mmHg (06/17 0600)  Intake/Output from previous day: 06/16 0701 - 06/17 0700 In: 5440 [I.V.:3555; Blood:685; IV Piggyback:1200] Out: 2600 [Urine:1785; Drains:415; Blood:400] Intake/Output this shift: Total I/O In: [id number] [I.V.:555; IV Piggyback:1200] Out: 355 [Urine:65; Drains:290]  Physical exam the patient is alert and oriented. She is moving her lower extremities well.  Lab Results:  Recent Labs  06/02/16 2140  WBC 11.4*  HGB 10.2*  HCT 30.3*  PLT 231   BMET  Recent Labs  06/02/16 0651 06/02/16 2140  NA 131* 133*  K 3.4* 3.5  CL 96* 102  CO2 26 23  GLUCOSE 80 132*  BUN 12 12  CREATININE 0.57 0.53  CALCIUM 9.7 8.3*    Studies/Results: Dg Lumbar Spine Complete  06/02/2016  CLINICAL DATA:  Lumbar spine surgery, L1-L2 XL IF, L5-S1 ALI EXAM: DG C-ARM 61-120 MIN; LUMBAR SPINE - COMPLETE 4+ VIEW COMPARISON:  Intraoperative images compared to preoperative MR 02/17/2016 FLUOROSCOPY TIME:  3 minutes 21 seconds Images obtained: 4 FINDINGS: Prior MR labeled with 5 lumbar vertebra. Prior posterior fusion of L2-L5 with BILATERAL pedicle screws and posterior bars. Disc prostheses present at T12-L1 and L1-L2. Interval anterior fusion of L5-S1. Bones significantly demineralized. Vertebral body heights maintained. IMPRESSION: New disc prostheses at T12-L1 and L1-L2 with additional anterior hardware at L5-S1. Old posterior fusion L2-L5. Electronically Signed   By: Lavonia Dana M.D.   On:  06/02/2016 17:22   Dg C-arm 61-120 Min  06/02/2016  CLINICAL DATA:  Lumbar spine surgery, L1-L2 XL IF, L5-S1 ALI EXAM: DG C-ARM 61-120 MIN; LUMBAR SPINE - COMPLETE 4+ VIEW COMPARISON:  Intraoperative images compared to preoperative MR 02/17/2016 FLUOROSCOPY TIME:  3 minutes 21 seconds Images obtained: 4 FINDINGS: Prior MR labeled with 5 lumbar vertebra. Prior posterior fusion of L2-L5 with BILATERAL pedicle screws and posterior bars. Disc prostheses present at T12-L1 and L1-L2. Interval anterior fusion of L5-S1. Bones significantly demineralized. Vertebral body heights maintained. IMPRESSION: New disc prostheses at T12-L1 and L1-L2 with additional anterior hardware at L5-S1. Old posterior fusion L2-L5. Electronically Signed   By: Lavonia Dana M.D.   On: 06/02/2016 17:22   Dg Ct Oarm Limited Study  06/02/2016  CLINICAL DATA:  Intra-op CT was utilized by requesting physician. No radiographic interpretation.   Dg Or Local Abdomen  06/02/2016  CLINICAL DATA:  Status post ALIF, no instrument count EXAM: OR LOCAL ABDOMEN COMPARISON:  02/17/2016 FINDINGS: Postsurgical changes are again seen from L2 to L5 and stable. New anterior fixation is noted at L5-S1. No retained foreign body is noted. IMPRESSION: Postoperative changes are noted. No radiopaque foreign body is noted. These results were called by telephone at the time of interpretation on 06/02/2016 at 10:22 am to  [facility] OR 4, who verbally acknowledged these results. Electronically Signed   By: Inez Catalina M.D.   On: 06/02/2016 10:22    Assessment/Plan: Postop day #1: The patient is doing well neurologically  Chest pain, elevated cardiac enzymes: She is not presently  having chest pain. Her enzymes are minimally elevated which I suspect is secondary to her back surgery and not cardiac in origin. I'll ask cardiology to see the patient for their opinion.  LOS: 1 day     Lonzo Saulter D 06/03/2016, 6:38 AM

## 2016-06-03 NOTE — Progress Notes (Signed)
Vertell Limber MD notified of pt's BMP & CBC results as well as pain 3/10 post use of PO liquid oxycodone. Pt not OOB but is more engaged in active ROM with RN. Orders received for 1 time 500 NSS bolus and change MIVF to NS 20KCl at 100/hr, & an am CBC.

## 2016-06-03 NOTE — Evaluation (Signed)
Physical Therapy Evaluation Patient Details Name: [name] MRN: [id number] DOB: 12/15/1946 Today's Date: 06/03/2016   History of Present Illness  Pt adm with scoliosis and spinal stenosis. Underwent L5-S1 anterior fusion, T10 to ileum posterior fusion, T12 - L2 lateral fusion on 6/16. PMH - lumbar fusion, HTN, Raynaud's, Sjogren's syndrome, polymyositis, RA, shingles with post neuralgic pain on rt flank  Clinical Impression  Pt admitted with above diagnosis and presents to PT with functional limitations due to deficits listed below (See PT problem list). Pt needs skilled PT to maximize independence and safety to allow discharge to CIR. Pt with chronic LE weakness that has resulted in pt needing multiple compensatory strategies for mobility. Now with extensive back surgery feel pt could benefit from CIR to assist her to return to her prior level of function.     Follow Up Recommendations CIR (If not accepted recommend SNF.)    Equipment Recommendations  None recommended by PT    Recommendations for Other Services       Precautions / Restrictions Precautions Precautions: Fall;Back Required Braces or Orthoses: Spinal Brace Spinal Brace: Lumbar corset;Applied in sitting position Restrictions Weight Bearing Restrictions: No      Mobility  Bed Mobility Overal bed mobility: Needs Assistance Bed Mobility: Rolling;Sidelying to Sit;Sit to Sidelying Rolling: Max assist Sidelying to sit: +2 for physical assistance;Max assist     Sit to sidelying: +2 for physical assistance;Mod assist General bed mobility comments: Assist to move legs and to elevate trunk into sitting. Assist to lower trunk and bring legs back up into bed.  Transfers Overall transfer level: Needs assistance Equipment used: 4-wheeled walker Transfers: Sit to/from Stand Sit to Stand: +2 physical assistance;Mod assist;From elevated surface         General transfer comment: Assist to bring hips up from  elevated bed. Due to chonic weakness pt uses wide base and has to lock knees into hyperextension to support weight. Then walks legs back underneath her.  Ambulation/Gait Ambulation/Gait assistance: Min assist;+2 safety/equipment Ambulation Distance (Feet): 10 Feet Assistive device: 4-wheeled walker Gait Pattern/deviations: Step-through pattern;Decreased step length - right;Decreased step length - left;Steppage;Shuffle;Wide base of support Gait velocity: decr  Gait velocity interpretation: Below normal speed for age/gender General Gait Details: Pt with multiple compensatory strageties for gait due to chronic LE weakness. Keeps LE's externally rotated and knees hyperextended in stance. Difficulty bringing LLE through at times.  Stairs            Wheelchair Mobility    Modified Rankin (Stroke Patients Only)       Balance Overall balance assessment: Needs assistance Sitting-balance support: Bilateral upper extremity supported Sitting balance-Leahy Scale: Poor Sitting balance - Comments: support of UE's for static sitting   Standing balance support: Bilateral upper extremity supported Standing balance-Leahy Scale: Poor Standing balance comment: Support of walker and min A for static standing.                             Pertinent Vitals/Pain Pain Assessment: Faces Faces Pain Scale: Hurts little more Pain Location: back Pain Descriptors / Indicators: Grimacing;Guarding Pain Intervention(s): Limited activity within patient's tolerance;Monitored during session;Premedicated before session    Home Living Family/patient expects to be discharged to:: Private residence Living Arrangements: Alone Available Help at Discharge: Family;Available 24 hours/day Type of Home: House Home Access: Level entry     Home Layout: One level Home Equipment: Walker - 4 wheels;Cane - single point;Bedside commode Additional Comments:  Only sits on high surfaces    Prior Function Level  of Independence: Independent with assistive device(s)         Comments: Multiple adaptations for her chronic weakness and medical issues.      Hand Dominance   Dominant Hand: Right    Extremity/Trunk Assessment   Upper Extremity Assessment: Defer to OT evaluation           Lower Extremity Assessment: RLE deficits/detail;LLE deficits/detail RLE Deficits / Details: chronic weakness. Grossly 2/5 LLE Deficits / Details: chronic weakness. Grossly 2/5     Communication   Communication: No difficulties  Cognition Arousal/Alertness: Awake/alert Behavior During Therapy: WFL for tasks assessed/performed Overall Cognitive Status: Within Functional Limits for tasks assessed                      General Comments      Exercises        Assessment/Plan    PT Assessment Patient needs continued PT services  PT Diagnosis Difficulty walking;Abnormality of gait;Generalized weakness   PT Problem List Decreased strength;Decreased activity tolerance;Decreased balance;Decreased mobility  PT Treatment Interventions DME instruction;Gait training;Functional mobility training;Therapeutic activities;Therapeutic exercise;Balance training;Patient/family education   PT Goals (Current goals can be found in the Care Plan section) Acute Rehab PT Goals Patient Stated Goal: Return home PT Goal Formulation: With patient Time For Goal Achievement: 06/17/16 Potential to Achieve Goals: Fair    Frequency Min 5X/week   Barriers to discharge Decreased caregiver support Lives alone    Co-evaluation               End of Session Equipment Utilized During Treatment: Gait belt;Back brace Activity Tolerance: Patient tolerated treatment well Patient left: in bed;with call bell/phone within reach;with nursing/sitter in room Nurse Communication: Mobility status         Time: [id number] PT Time Calculation (min) (ACUTE ONLY): 33 min   Charges:   PT Evaluation $PT Eval Moderate  Complexity: 1 Procedure PT Treatments $Gait Training: 8-22 mins   PT G Codes:        Leaann Nevils 2016/07/01, 3:12 PM [employer] PT [phone]

## 2016-06-03 NOTE — Progress Notes (Signed)
On-Call service notified for MD to return call regarding pt's Troponin levels are increasing. Also saw in a note that Cardiology could be consulted and was going to request that they are.

## 2016-06-03 NOTE — Progress Notes (Signed)
Subjective: Interval History: none.. Looks quite good today. Reports no abdominal pain but does have back pain with control regarding  Objective: Vital signs in last 24 hours: Temp:  [97.3 F (36.3 C)-98.3 F (36.8 C)] 98.1 F (36.7 C) (06/17 0800) Pulse Rate:  [43-110] 64 (06/17 0900) Resp:  [11-31] 18 (06/17 0900) BP: (83-155)/(31-125) 97/63 mmHg (06/17 0900) SpO2:  [92 %-100 %] 95 % (06/17 0900) Arterial Line BP: (93-170)/(41-85) 112/41 mmHg (06/17 0600)  Intake/Output from previous day: 06/16 0701 - 06/17 0700 In: 5552.5 [I.V.:3667.5; Blood:685; IV Piggyback:1200] Out: 2600 [Urine:1785; Drains:415; Blood:400] Intake/Output this shift: Total I/O In: -  Out: 35 [Urine:35]  Abdomen soft and nontender. Lower abdominal incision healing. 2+ posterior tibial pulses bilaterally  Lab Results:  Recent Labs  06/02/16 2140  WBC 11.4*  HGB 10.2*  HCT 30.3*  PLT 231   BMET  Recent Labs  06/02/16 0651 06/02/16 2140  NA 131* 133*  K 3.4* 3.5  CL 96* 102  CO2 26 23  GLUCOSE 80 132*  BUN 12 12  CREATININE 0.57 0.53  CALCIUM 9.7 8.3*    Studies/Results: Dg Lumbar Spine Complete  06/02/2016  CLINICAL DATA:  Lumbar spine surgery, L1-L2 XL IF, L5-S1 ALI EXAM: DG C-ARM 61-120 MIN; LUMBAR SPINE - COMPLETE 4+ VIEW COMPARISON:  Intraoperative images compared to preoperative MR 02/17/2016 FLUOROSCOPY TIME:  3 minutes 21 seconds Images obtained: 4 FINDINGS: Prior MR labeled with 5 lumbar vertebra. Prior posterior fusion of L2-L5 with BILATERAL pedicle screws and posterior bars. Disc prostheses present at T12-L1 and L1-L2. Interval anterior fusion of L5-S1. Bones significantly demineralized. Vertebral body heights maintained. IMPRESSION: New disc prostheses at T12-L1 and L1-L2 with additional anterior hardware at L5-S1. Old posterior fusion L2-L5. Electronically Signed   By: Lavonia Dana M.D.   On: 06/02/2016 17:22   Dg C-arm 61-120 Min  06/02/2016  CLINICAL DATA:  Lumbar spine  surgery, L1-L2 XL IF, L5-S1 ALI EXAM: DG C-ARM 61-120 MIN; LUMBAR SPINE - COMPLETE 4+ VIEW COMPARISON:  Intraoperative images compared to preoperative MR 02/17/2016 FLUOROSCOPY TIME:  3 minutes 21 seconds Images obtained: 4 FINDINGS: Prior MR labeled with 5 lumbar vertebra. Prior posterior fusion of L2-L5 with BILATERAL pedicle screws and posterior bars. Disc prostheses present at T12-L1 and L1-L2. Interval anterior fusion of L5-S1. Bones significantly demineralized. Vertebral body heights maintained. IMPRESSION: New disc prostheses at T12-L1 and L1-L2 with additional anterior hardware at L5-S1. Old posterior fusion L2-L5. Electronically Signed   By: Lavonia Dana M.D.   On: 06/02/2016 17:22   Dg Ct Oarm Limited Study  06/02/2016  CLINICAL DATA:  Intra-op CT was utilized by requesting physician. No radiographic interpretation.   Dg Or Local Abdomen  06/02/2016  CLINICAL DATA:  Status post ALIF, no instrument count EXAM: OR LOCAL ABDOMEN COMPARISON:  02/17/2016 FINDINGS: Postsurgical changes are again seen from L2 to L5 and stable. New anterior fixation is noted at L5-S1. No retained foreign body is noted. IMPRESSION: Postoperative changes are noted. No radiopaque foreign body is noted. These results were called by telephone at the time of interpretation on 06/02/2016 at 10:22 am to [facility] in [facility] OR 4, who verbally acknowledged these results. Electronically Signed   By: Inez Catalina M.D.   On: 06/02/2016 10:22   Anti-infectives: Anti-infectives    Start     Dose/Rate Route Frequency Ordered Stop   06/02/16 2200  ceFAZolin (ANCEF) IVPB 1 g/50 mL premix     1 g 100 mL/hr over 30 Minutes Intravenous  Every 8 hours 06/02/16 1900 06/03/16 0630   06/02/16 1915  hydroxychloroquine (PLAQUENIL) tablet 200 mg     200 mg Oral Daily 06/02/16 1900     06/02/16 0715  ceFAZolin (ANCEF) IVPB 2g/100 mL premix     2 g 200 mL/hr over 30 Minutes Intravenous To Neuro OR-Station #32 06/01/16 1242 06/02/16 1415   06/02/16 0600   ceFAZolin (ANCEF) 2 g in dextrose 5 % 50 mL IVPB  Status:  Discontinued     2 g 140 mL/hr over 30 Minutes Intravenous On call to O.R. 06/01/16 1242 06/01/16 1242      Assessment/Plan: s/p Procedure(s): Lumbar five-sacral one Anterior lumbar interbody fusion with Dr. Donnetta Hutching for approach (N/A) Exploration of Fusion Lumbar two-five, Thoracic ten-ileum posterior fusion, Posterolateral arthrodesis with AIRO (Bilateral) APPLICATION OF INTRAOPERATIVE CT SCAN (N/A) ABDOMINAL EXPOSURE (N/A) Thoracic twelve-Lumbar one, Lumbar one-two anterior Lateral Interbody Fusion Stable postop day 1. Will not follow actively. Please call if there are vascular issues.   LOS: 1 day   Curt Jews 06/03/2016, 9:28 AM

## 2016-06-03 NOTE — Progress Notes (Signed)
Subjective: Patient reports doing well  Objective: Vital signs in last 24 hours: Temp:  [97.3 F (36.3 C)-98.3 F (36.8 C)] 98.1 F (36.7 C) (06/17 0800) Pulse Rate:  [43-110] 79 (06/17 0800) Resp:  [11-31] 14 (06/17 0800) BP: (83-155)/(31-125) 84/54 mmHg (06/17 0800) SpO2:  [92 %-100 %] 96 % (06/17 0800) Arterial Line BP: (93-170)/(41-85) 112/41 mmHg (06/17 0600)  Intake/Output from previous day: 06/16 0701 - 06/17 0700 In: 5552.5 [I.V.:3667.5; Blood:685; IV Piggyback:1200] Out: 2600 [Urine:1785; Drains:415; Blood:400] Intake/Output this shift: Total I/O In: -  Out: 20 [Urine:20]  Physical Exam: Strength full in legs.  Sitting up in bed.  Mild abdominal and back discomfort.  Lab Results:  Recent Labs  06/02/16 2140  WBC 11.4*  HGB 10.2*  HCT 30.3*  PLT 231   BMET  Recent Labs  06/02/16 0651 06/02/16 2140  NA 131* 133*  K 3.4* 3.5  CL 96* 102  CO2 26 23  GLUCOSE 80 132*  BUN 12 12  CREATININE 0.57 0.53  CALCIUM 9.7 8.3*    Studies/Results: Dg Lumbar Spine Complete  06/02/2016  CLINICAL DATA:  Lumbar spine surgery, L1-L2 XL IF, L5-S1 ALI EXAM: DG C-ARM 61-120 MIN; LUMBAR SPINE - COMPLETE 4+ VIEW COMPARISON:  Intraoperative images compared to preoperative MR 02/17/2016 FLUOROSCOPY TIME:  3 minutes 21 seconds Images obtained: 4 FINDINGS: Prior MR labeled with 5 lumbar vertebra. Prior posterior fusion of L2-L5 with BILATERAL pedicle screws and posterior bars. Disc prostheses present at T12-L1 and L1-L2. Interval anterior fusion of L5-S1. Bones significantly demineralized. Vertebral body heights maintained. IMPRESSION: New disc prostheses at T12-L1 and L1-L2 with additional anterior hardware at L5-S1. Old posterior fusion L2-L5. Electronically Signed   By: Lavonia Dana M.D.   On: 06/02/2016 17:22   Dg C-arm 61-120 Min  06/02/2016  CLINICAL DATA:  Lumbar spine surgery, L1-L2 XL IF, L5-S1 ALI EXAM: DG C-ARM 61-120 MIN; LUMBAR SPINE - COMPLETE 4+ VIEW COMPARISON:   Intraoperative images compared to preoperative MR 02/17/2016 FLUOROSCOPY TIME:  3 minutes 21 seconds Images obtained: 4 FINDINGS: Prior MR labeled with 5 lumbar vertebra. Prior posterior fusion of L2-L5 with BILATERAL pedicle screws and posterior bars. Disc prostheses present at T12-L1 and L1-L2. Interval anterior fusion of L5-S1. Bones significantly demineralized. Vertebral body heights maintained. IMPRESSION: New disc prostheses at T12-L1 and L1-L2 with additional anterior hardware at L5-S1. Old posterior fusion L2-L5. Electronically Signed   By: Lavonia Dana M.D.   On: 06/02/2016 17:22   Dg Ct Oarm Limited Study  06/02/2016  CLINICAL DATA:  Intra-op CT was utilized by requesting physician. No radiographic interpretation.   Dg Or Local Abdomen  06/02/2016  CLINICAL DATA:  Status post ALIF, no instrument count EXAM: OR LOCAL ABDOMEN COMPARISON:  02/17/2016 FINDINGS: Postsurgical changes are again seen from L2 to L5 and stable. New anterior fixation is noted at L5-S1. No retained foreign body is noted. IMPRESSION: Postoperative changes are noted. No radiopaque foreign body is noted. These results were called by telephone at the time of interpretation on 06/02/2016 at 10:22 am to [facility] in [facility] OR 4, who verbally acknowledged these results. Electronically Signed   By: Inez Catalina M.D.   On: 06/02/2016 10:22    Assessment/Plan: Hypotensive.  Fluid bolus.  IV tylenol.  Mobilize in brace with PT.  Continue drain today (300 cc overnight).  Check CBC.  Cardiology for elevated enzymes.    LOS: 1 day    Peggyann Shoals, MD 06/03/2016, 9:01 AM

## 2016-06-04 ENCOUNTER — Other Ambulatory Visit: Payer: Self-pay

## 2016-06-04 DIAGNOSIS — R0789 Other chest pain: Secondary | ICD-10-CM | POA: Insufficient documentation

## 2016-06-04 LAB — CBC
HCT: 22.6 % — ABNORMAL LOW (ref 36.0–46.0)
HEMOGLOBIN: 7.4 g/dL — AB (ref 12.0–15.0)
MCH: 26.5 pg (ref 26.0–34.0)
MCHC: 32.7 g/dL (ref 30.0–36.0)
MCV: 81 fL (ref 78.0–100.0)
PLATELETS: 148 10*3/uL — AB (ref 150–400)
RBC: 2.79 MIL/uL — AB (ref 3.87–5.11)
RDW: 15.1 % (ref 11.5–15.5)
WBC: 8.2 10*3/uL (ref 4.0–10.5)

## 2016-06-04 LAB — TROPONIN I
TROPONIN I: 0.9 ng/mL — AB (ref <0.031)
Troponin I: 0.85 ng/mL (ref <0.031)
Troponin I: 0.95 ng/mL (ref <0.031)

## 2016-06-04 LAB — PREPARE RBC (CROSSMATCH)

## 2016-06-04 MED ORDER — SODIUM CHLORIDE 0.9 % IV SOLN: Freq: Once | INTRAVENOUS | Status: DC

## 2016-06-04 MED ORDER — FERROUS SULFATE 325 (65 FE) MG PO TABS
325.0000 mg | ORAL_TABLET | Freq: Every day | ORAL | Status: DC
  Administered 2016-06-05 – 2016-06-06 (×2): 325 mg via ORAL
  Filled 2016-06-04 (×4): qty 1

## 2016-06-04 MED ORDER — ACETAMINOPHEN 160 MG/5ML PO SOLN
500.0000 mg | ORAL | Status: DC | PRN
  Administered 2016-06-04: 500 mg via ORAL
  Filled 2016-06-04: qty 20.3

## 2016-06-04 NOTE — Progress Notes (Signed)
Inpatient Rehabilitation  Per PT request, patient was screened by Venus Ruhe for appropriateness for an Inpatient Acute Rehab consult.  At this time we are recommending an Inpatient Rehab consult.  Please order if you are agreeable.    Callyn Severtson, M.A., CCC/SLP Admission Coordinator  [facility]  Cell [phone]  

## 2016-06-04 NOTE — Treatment Plan (Signed)
CRITICAL VALUE ALERT  Critical value received:  Troponin- 0.85  Date of notification:  06/04/16  Time of notification:  0703  Critical value read back:Yes.    Nurse who received alert:  Alcario Drought  MD notified (1st page):  Bensimmone   Time of first page:  0715  MD notified (2nd page):  Time of second page:  Responding MD:  Bensimmone  Time MD responded:  [phone]

## 2016-06-04 NOTE — Progress Notes (Signed)
Patient ID: [name], female   DOB: Feb 21, 1946, 70 y.o.   MRN: [id number] Subjective:  the patient is alert and pleasant. Her sons are at her bedside. She denies chest pain. Her back is appropriately sore.  Objective: Vital signs in last 24 hours: Temp:  [98.1 F (36.7 C)-98.7 F (37.1 C)] 98.3 F (36.8 C) (06/18 0800) Pulse Rate:  [59-109] 97 (06/18 0900) Resp:  [13-21] 19 (06/18 0900) BP: (88-123)/(49-104) 99/75 mmHg (06/18 0900) SpO2:  [79 %-100 %] 100 % (06/18 0800) Weight:  [56.6 kg (124 lb 12.5 oz)] 56.6 kg (124 lb 12.5 oz) (06/18 0300)  Intake/Output from previous day: 06/17 0701 - 06/18 0700 In: 3056.3 [I.V.:2030.4; IV Piggyback:1025.8] Out: 990 [Urine:655; Drains:335] Intake/Output this shift: Total I/O In: 155 [I.V.:100; IV Piggyback:55] Out: 25 [Urine:25]  Physical exam the patient is alert and oriented 3. She is moving her lower extremities well.  Lab Results:  Recent Labs  06/03/16 1058 06/04/16 0552  WBC 8.8 8.2  HGB 8.5* 7.4*  HCT 25.8* 22.6*  PLT 187 148*   BMET  Recent Labs  06/02/16 2140 06/03/16 1058  NA 133* 129*  K 3.5 3.4*  CL 102 101  CO2 23 21*  GLUCOSE 132* 125*  BUN 12 12  CREATININE 0.53 0.68  CALCIUM 8.3* 7.8*    Studies/Results: Dg Lumbar Spine Complete  06/02/2016  CLINICAL DATA:  Lumbar spine surgery, L1-L2 XL IF, L5-S1 ALI EXAM: DG C-ARM 61-120 MIN; LUMBAR SPINE - COMPLETE 4+ VIEW COMPARISON:  Intraoperative images compared to preoperative MR 02/17/2016 FLUOROSCOPY TIME:  3 minutes 21 seconds Images obtained: 4 FINDINGS: Prior MR labeled with 5 lumbar vertebra. Prior posterior fusion of L2-L5 with BILATERAL pedicle screws and posterior bars. Disc prostheses present at T12-L1 and L1-L2. Interval anterior fusion of L5-S1. Bones significantly demineralized. Vertebral body heights maintained. IMPRESSION: New disc prostheses at T12-L1 and L1-L2 with additional anterior hardware at L5-S1. Old posterior fusion L2-L5.  Electronically Signed   By: Lavonia Dana M.D.   On: 06/02/2016 17:22   Dg C-arm 61-120 Min  06/02/2016  CLINICAL DATA:  Lumbar spine surgery, L1-L2 XL IF, L5-S1 ALI EXAM: DG C-ARM 61-120 MIN; LUMBAR SPINE - COMPLETE 4+ VIEW COMPARISON:  Intraoperative images compared to preoperative MR 02/17/2016 FLUOROSCOPY TIME:  3 minutes 21 seconds Images obtained: 4 FINDINGS: Prior MR labeled with 5 lumbar vertebra. Prior posterior fusion of L2-L5 with BILATERAL pedicle screws and posterior bars. Disc prostheses present at T12-L1 and L1-L2. Interval anterior fusion of L5-S1. Bones significantly demineralized. Vertebral body heights maintained. IMPRESSION: New disc prostheses at T12-L1 and L1-L2 with additional anterior hardware at L5-S1. Old posterior fusion L2-L5. Electronically Signed   By: Lavonia Dana M.D.   On: 06/02/2016 17:22   Dg Ct Oarm Limited Study  06/02/2016  CLINICAL DATA:  Intra-op CT was utilized by requesting physician. No radiographic interpretation.    Assessment/Plan: Postop day #2: The patient is doing well neurologically.  Anemia, elevated troponin: I have discussed this with the patient and her family.I recommended that she be transfused 2 units of packed red blood cells  I have answered all her questions.She is agreeable.  Hyponatremia: Asymptomatic   Taft Worthing D 06/04/2016, 10:24 AM

## 2016-06-04 NOTE — Progress Notes (Signed)
Utilization review completed.  

## 2016-06-04 NOTE — Progress Notes (Signed)
   SUBJECTIVE: The patient is doing well today.  At this time, she denies chest pain, shortness of breath, or any new concerns.  . sodium chloride   Intravenous Once  . cholecalciferol  5,000 Units Oral Daily  . docusate sodium  100 mg Oral BID  . famotidine  20 mg Oral Daily  . hydroxychloroquine  200 mg Oral Daily  . predniSONE  5 mg Oral Q breakfast  . sodium chloride flush  3 mL Intravenous Q12H  . timolol  1 drop Both Eyes QHS  . traMADol  50 mg Oral q morning - 10a  . triamterene-hydrochlorothiazide  1 tablet Oral Daily   . sodium chloride    . 0.9 % NaCl with KCl 20 mEq / L 100 mL/hr at 06/04/16 0236    OBJECTIVE: Physical Exam: Filed Vitals:   06/04/16 0600 06/04/16 0700 06/04/16 0800 06/04/16 0900  BP: 115/65 110/61 123/90 99/75  Pulse: 78 87 91 97  Temp:   98.3 F (36.8 C)   TempSrc:   Oral   Resp: 16 16 19 19   Height:      Weight:      SpO2:  98% 100%     Intake/Output Summary (Last 24 hours) at 06/04/16 1138 Last data filed at 06/04/16 0800  Gross per 24 hour  Intake 2761.25 ml  Output    950 ml  Net 1811.25 ml    Telemetry reveals sinus rhythm  GEN- The patient is well appearing, alert and oriented x 3 today.  verbose Head- normocephalic, atraumatic Eyes-  Sclera clear, conjunctiva pink Ears- hearing intact Oropharynx- clear Neck- supple,   Lungs- Clear to ausculation bilaterally, normal work of breathing Heart- Regular rate and rhythm, no murmurs, rubs or gallops, PMI not laterally displaced GI- soft, NT, ND, + BS Extremities- no clubbing, cyanosis, or edema  LABS: Basic Metabolic Panel:  Recent Labs  06/02/16 2140 06/03/16 1058  NA 133* 129*  K 3.5 3.4*  CL 102 101  CO2 23 21*  GLUCOSE 132* 125*  BUN 12 12  CREATININE 0.53 0.68  CALCIUM 8.3* 7.8*   Liver Function Tests: No results for input(s): AST, ALT, ALKPHOS, BILITOT, PROT, ALBUMIN in the last 72 hours. No results for input(s): LIPASE, AMYLASE in the last 72  hours. CBC:  Recent Labs  06/02/16 2140 06/03/16 1058 06/04/16 0552  WBC 11.4* 8.8 8.2  NEUTROABS 9.6* 6.7  --   HGB 10.2* 8.5* 7.4*  HCT 30.3* 25.8* 22.6*  MCV 81.0 81.4 81.0  PLT 231 187 148*   Cardiac Enzymes:  Recent Labs  06/02/16 2140  06/03/16 1554 06/03/16 2045 06/04/16 0552  CKTOTAL 350*  --   --   --   --   TROPONINI 0.04*  < > 0.18* 0.08* 0.85*  < > = values in this interval not displayed.  ekg reveals sinus with lateral twi, borderline LVH criteria  ASSESSMENT AND PLAN:   1. Atypical chest pain Resolved Mild troponin elevation likely due to demand ischemia of surgery Echo pending.  If low risk, would recommend outpatient myoview once she has recovered from her surgery.  If echo is significantly abnormal, further CV testing could be considered.  Ok to transfer to routine floor bed from my standpoint.    Thompson Grayer, MD 06/04/2016 11:38 AM

## 2016-06-05 ENCOUNTER — Encounter (HOSPITAL_COMMUNITY): Payer: Self-pay

## 2016-06-05 ENCOUNTER — Other Ambulatory Visit (HOSPITAL_COMMUNITY): Payer: Medicare HMO

## 2016-06-05 ENCOUNTER — Inpatient Hospital Stay (HOSPITAL_COMMUNITY): Payer: Medicare HMO

## 2016-06-05 DIAGNOSIS — D62 Acute posthemorrhagic anemia: Secondary | ICD-10-CM

## 2016-06-05 DIAGNOSIS — M539 Dorsopathy, unspecified: Secondary | ICD-10-CM

## 2016-06-05 DIAGNOSIS — Z419 Encounter for procedure for purposes other than remedying health state, unspecified: Secondary | ICD-10-CM

## 2016-06-05 DIAGNOSIS — G8929 Other chronic pain: Secondary | ICD-10-CM

## 2016-06-05 DIAGNOSIS — M545 Low back pain: Secondary | ICD-10-CM

## 2016-06-05 DIAGNOSIS — D72829 Elevated white blood cell count, unspecified: Secondary | ICD-10-CM | POA: Insufficient documentation

## 2016-06-05 DIAGNOSIS — M961 Postlaminectomy syndrome, not elsewhere classified: Secondary | ICD-10-CM | POA: Insufficient documentation

## 2016-06-05 DIAGNOSIS — E876 Hypokalemia: Secondary | ICD-10-CM

## 2016-06-05 DIAGNOSIS — M3320 Polymyositis, organ involvement unspecified: Secondary | ICD-10-CM

## 2016-06-05 DIAGNOSIS — I10 Essential (primary) hypertension: Secondary | ICD-10-CM

## 2016-06-05 DIAGNOSIS — M069 Rheumatoid arthritis, unspecified: Secondary | ICD-10-CM

## 2016-06-05 DIAGNOSIS — M3500 Sicca syndrome, unspecified: Secondary | ICD-10-CM

## 2016-06-05 DIAGNOSIS — R079 Chest pain, unspecified: Secondary | ICD-10-CM

## 2016-06-05 LAB — POCT I-STAT 7, (LYTES, BLD GAS, ICA,H+H)
Acid-base deficit: 1 mmol/L (ref 0.0–2.0)
Acid-base deficit: 2 mmol/L (ref 0.0–2.0)
BICARBONATE: 24.5 meq/L — AB (ref 20.0–24.0)
BICARBONATE: 25.2 meq/L — AB (ref 20.0–24.0)
Bicarbonate: 23.2 mEq/L (ref 20.0–24.0)
CALCIUM ION: 1.2 mmol/L (ref 1.13–1.30)
Calcium, Ion: 1.23 mmol/L (ref 1.13–1.30)
Calcium, Ion: 1.22 mmol/L (ref 1.13–1.30)
HCT: 25 % — ABNORMAL LOW (ref 36.0–46.0)
HCT: 36 % (ref 36.0–46.0)
HEMATOCRIT: 29 % — AB (ref 36.0–46.0)
Hemoglobin: 8.5 g/dL — ABNORMAL LOW (ref 12.0–15.0)
Hemoglobin: 12.2 g/dL (ref 12.0–15.0)
Hemoglobin: 9.9 g/dL — ABNORMAL LOW (ref 12.0–15.0)
O2 SAT: 100 %
O2 Saturation: 100 %
O2 Saturation: 100 %
PO2 ART: 268 mmHg — AB (ref 80.0–100.0)
PO2 ART: 288 mmHg — AB (ref 80.0–100.0)
Patient temperature: 35
Potassium: 2.8 mmol/L — ABNORMAL LOW (ref 3.5–5.1)
Potassium: 2.9 mmol/L — ABNORMAL LOW (ref 3.5–5.1)
Potassium: 3.1 mmol/L — ABNORMAL LOW (ref 3.5–5.1)
SODIUM: 132 mmol/L — AB (ref 135–145)
SODIUM: 133 mmol/L — AB (ref 135–145)
Sodium: 134 mmol/L — ABNORMAL LOW (ref 135–145)
TCO2: 26 mmol/L (ref 0–100)
TCO2: 27 mmol/L (ref 0–100)
TCO2: 24 mmol/L (ref 0–100)
pCO2 arterial: 37.2 mmHg (ref 35.0–45.0)
pCO2 arterial: 41.2 mmHg (ref 35.0–45.0)
pCO2 arterial: 37.5 mmHg (ref 35.0–45.0)
pH, Arterial: 7.418 (ref 7.350–7.450)
pH, Arterial: 7.38 (ref 7.350–7.450)
pH, Arterial: 7.39 (ref 7.350–7.450)
pO2, Arterial: 280 mmHg — ABNORMAL HIGH (ref 80.0–100.0)

## 2016-06-05 LAB — ECHOCARDIOGRAM COMPLETE
CHL CUP MV DEC (S): 203
CHL CUP PV REG GRAD DIAS: 5 mmHg
CHL CUP REG VEL DIAS: 115 cm/s
E decel time: 203 msec
E/e' ratio: 6.35
FS: 39 % (ref 28–44)
Height: 58.5 in
IV/PV OW: 0.96
LA ID, A-P, ES: 20 mm
LA vol index: 15.7 mL/m2
LADIAMINDEX: 1.3 cm/m2
LAVOL: 24.2 mL
LAVOLA4C: 21 mL
LEFT ATRIUM END SYS DIAM: 20 mm
LV E/e' medial: 6.35
LV PW d: 8.58 mm — AB (ref 0.6–1.1)
LV e' LATERAL: 7.72 cm/s
LVEEAVG: 6.35
LVOT area: 2.84 cm2
LVOTD: 19 mm
MV pk A vel: 95.6 m/s
MVPKEVEL: 49 m/s
RV LATERAL S' VELOCITY: 7.29 cm/s
TAPSE: 18.7 mm
TDI e' lateral: 7.72
TDI e' medial: 5.55
WEIGHTICAEL: 1996.49 [oz_av]

## 2016-06-05 LAB — CBC
HCT: 29.9 % — ABNORMAL LOW (ref 36.0–46.0)
Hemoglobin: 10.1 g/dL — ABNORMAL LOW (ref 12.0–15.0)
MCH: 27.5 pg (ref 26.0–34.0)
MCHC: 33.8 g/dL (ref 30.0–36.0)
MCV: 81.5 fL (ref 78.0–100.0)
PLATELETS: 158 10*3/uL (ref 150–400)
RBC: 3.67 MIL/uL — AB (ref 3.87–5.11)
RDW: 14.7 % (ref 11.5–15.5)
WBC: 11.1 10*3/uL — ABNORMAL HIGH (ref 4.0–10.5)

## 2016-06-05 LAB — TROPONIN I
TROPONIN I: 0.53 ng/mL — AB (ref <0.031)
Troponin I: 0.81 ng/mL (ref <0.031)

## 2016-06-05 MED ORDER — TRAMADOL HCL 50 MG PO TABS
50.0000 mg | ORAL_TABLET | Freq: Four times a day (QID) | ORAL | Status: DC | PRN
  Administered 2016-06-06 – 2016-06-08 (×3): 50 mg via ORAL
  Filled 2016-06-05 (×3): qty 1

## 2016-06-05 MED FILL — Heparin Sodium (Porcine) Inj 1000 Unit/ML: INTRAMUSCULAR | Qty: 30 | Status: AC

## 2016-06-05 MED FILL — Sodium Chloride IV Soln 0.9%: INTRAVENOUS | Qty: 1000 | Status: AC

## 2016-06-05 NOTE — Progress Notes (Signed)
   06/05/16 1330  Clinical Encounter Type  Visit Type Initial;Spiritual support  Spiritual Encounters  Spiritual Needs Emotional;Grief support;Prayer  Stress Factors  Patient Stress Factors Health changes  Family Stress Factors Loss  Chaplain visited patient on afternoon rounds.  Patient very alert and talkative.  Patient became very emotional over the loss of several family members esoecially the loss of her husband of 50 years two years ago.  Patient's son was present initialy.  Patient shared her testimony of how she came to [facility] as a young child and other stories that were meaningful to her conversion to Christianity.  Patient shared her concerns regarding her inability to walk at present and her loss of independence.  Patient expressed she does not want to be a burden on her family.  Patient has five sons.  Patient asked Chaplain to share her testimony.  Chaplain obliged and then offered prayer.  Chaplain prayed for patient. Chaplain offered further spiritual support if needed.

## 2016-06-05 NOTE — Progress Notes (Addendum)
Patient arrived to unit alert and oriented x 4. Patient states pain 7/10 sharpe pain that increases with movement. Patient states nausea and stomache ache 5/10. Patient LAC IV leaking, splotchy red, edematous removed. Peripheral IV placed in patients posterior RH by Eber Jones, RN. Patient belongings at bedside, see flow sheet. Patient oriented to unit, call light and telephone placed within patients reach. RN will continue to monitor.

## 2016-06-05 NOTE — Care Management Important Message (Signed)
Important Message  Patient Details  Name: NYKEA JACEK MRN: [id number] Date of Birth: 09/10/1946   Medicare Important Message Given:  Yes    Loann Quill 06/05/2016, 10:24 AM

## 2016-06-05 NOTE — Evaluation (Signed)
Occupational Therapy Evaluation Patient Details Name: [name] MRN: [id number] DOB: Jun 30, 1946 Today's Date: 06/05/2016    History of Present Illness Pt adm with scoliosis and spinal stenosis. Underwent L5-S1 anterior fusion, T10 to ileum posterior fusion, T12 - L2 lateral fusion on 6/16. 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 Currently pt requires mod assist +2 for functional mobility. Pt requires mod-max assist for ADLs. Pt extremely anxious regarding mobility due to history of falls at home; pt easily distracted and hyperverbal despite max verbal cues to focus on task. Pt has a very supportive family and desires to regain functional independence. Recommending CIR level therapies for follow up in order to maximize independence and safety with ADLs and functional mobility prior to return home. Pt would benefit from continued skilled OT to address established goals.    Follow Up Recommendations  CIR;Supervision/Assistance - 24 hour    Equipment Recommendations  Other (comment) (TBD at next venue)    Recommendations for Other Services Rehab consult     Precautions / Restrictions Precautions Precautions: Fall;Back Precaution Booklet Issued: Yes (comment) Precaution Comments: went over handout with son, Nicki Reaper, and pt, pt with good understanding Required Braces or Orthoses: Spinal Brace Spinal Brace: Lumbar corset;Applied in sitting position Restrictions Weight Bearing Restrictions: No      Mobility Bed Mobility Overal bed mobility: Needs Assistance Bed Mobility: Sidelying to Sit Rolling:  (pt on L sidelying)       Sit to sidelying: Max assist General bed mobility comments: Pt sitting EOB with PT upon arrival.  Transfers Overall transfer level: Needs assistance Equipment used: 2 person hand held  assist Transfers: Sit to/from Stand Sit to Stand: +2 physical assistance;Mod assist;From elevated surface         General transfer comment: pt extremely anxious, max verbal cues to relax, pulled up on PT and OT and then used RW    Balance Overall balance assessment: Needs assistance Sitting-balance support: Bilateral upper extremity supported;Feet supported Sitting balance-Leahy Scale: Poor Sitting balance - Comments: unable to tolerate sitting without bilat UE support due to onset of pain and weak abdominal muscles   Standing balance support: Bilateral upper extremity supported Standing balance-Leahy Scale: Poor Standing balance comment: dependent on AE or physical external assist                            ADL Overall ADL's : Needs assistance/impaired Eating/Feeding: Set up;Sitting   Grooming: Set up;Sitting;Wash/dry face;Brushing hair;Minimal assistance Grooming Details (indicate cue type and reason): Min assist for knot in back of head Upper Body Bathing: Minimal assitance;Sitting   Lower Body Bathing: Maximal assistance;+2 for physical assistance;Sit to/from stand   Upper Body Dressing : Moderate assistance;Sitting   Lower Body Dressing: Maximal assistance;+2 for physical assistance;Sit to/from stand   Toilet Transfer: Moderate assistance;+2 for physical assistance;Ambulation;BSC;RW Toilet Transfer Details (indicate cue type and reason): Simulated by transfer from EOB to chair. Pt with increased anxiety and distraction requiring max assist at end of functional mobility bout. Toileting- Clothing Manipulation and Hygiene: Maximal assistance;+2 for physical assistance;Sit to/from stand       Functional mobility during ADLs: Moderate assistance;+2 for physical assistance;Rolling walker General ADL Comments: Pt very anxious, easily distracted, and hyperverbal. Despite max verbal cues to focus on ambulation pt becoming distracted and requiring max assist +2 to  maintain upright position at end of functional mobility bout.     Vision Vision Assessment?: No apparent visual deficits   Perception     Praxis      Pertinent Vitals/Pain Pain Assessment: 0-10 Pain Score: 6  Pain Location: back Pain Descriptors / Indicators: Grimacing Pain Intervention(s): Monitored during session;Repositioned     Hand Dominance Right   Extremity/Trunk Assessment Upper Extremity Assessment Upper Extremity Assessment: Generalized weakness   Lower Extremity Assessment Lower Extremity Assessment: Defer to PT evaluation   Cervical / Trunk Assessment Cervical / Trunk Assessment: Other exceptions Cervical / Trunk Exceptions: s/p back sx   Communication Communication Communication: No difficulties   Cognition Arousal/Alertness: Awake/alert Behavior During Therapy: Anxious (Hyperverbal. Easily distracted) Overall Cognitive Status: Within Functional Limits for tasks assessed                     General Comments       Exercises       Shoulder Instructions      Home Living Family/patient expects to be discharged to:: Unsure Living Arrangements: Alone Available Help at Discharge: Family;Available 24 hours/day Type of Home: House Home Access: Level entry     Home Layout: One level     Bathroom Shower/Tub: Tub/shower unit Shower/tub characteristics: Architectural technologist: Standard Bathroom Accessibility: Yes How Accessible: Accessible via walker Home Equipment: [place] - 4 wheels;Cane - single point;Bedside commode          Prior Functioning/Environment Level of Independence: Independent with assistive device(s)        Comments: Multiple adaptations for her chronic weakness and medical issues.     OT Diagnosis: Generalized weakness;Acute pain   OT Problem List: Decreased strength;Decreased range of motion;Decreased activity tolerance;Impaired balance (sitting and/or standing);Decreased coordination;Decreased safety  awareness;Decreased knowledge of use of DME or AE;Decreased knowledge of precautions;Pain   OT Treatment/Interventions: Self-care/ADL training;Therapeutic exercise;Energy conservation;DME and/or AE instruction;Therapeutic activities;Patient/family education;Balance training    OT Goals(Current goals can be found in the care plan section) Acute Rehab OT Goals Patient Stated Goal: walk OT Goal Formulation: With patient Time For Goal Achievement: 06/19/16 Potential to Achieve Goals: Good ADL Goals Pt Will Perform Grooming: with supervision;standing Pt Will Perform Lower Body Bathing: with supervision;with adaptive equipment;sit to/from stand Pt Will Perform Lower Body Dressing: with supervision;with adaptive equipment;sit to/from stand Pt Will Transfer to Toilet: with supervision;ambulating;bedside commode Pt Will Perform Toileting - Clothing Manipulation and hygiene: with supervision;sit to/from stand (with or without AE) Additional ADL Goal #1: Pt will verbally recall 3/3 back precautions and maintain throughout ADL. Additional ADL Goal #2: Pt will don/doff back brace with set up as precursor for ADLs and functional mobility.  OT Frequency: Min 2X/week   Barriers to D/C:    Lives alone but has good family support       Co-evaluation PT/OT/SLP Co-Evaluation/Treatment: Yes Reason for Co-Treatment: Complexity of the patient's impairments (multi-system involvement) PT goals addressed during session: Mobility/safety with mobility OT goals addressed during session: ADL's and self-care;Other (comment) (functional mobility)      End of Session Equipment Utilized During Treatment: Gait belt;Rolling walker;Back brace Nurse Communication: Mobility status Research scientist (life sciences))  Activity Tolerance: Other (comment);Patient tolerated treatment well (Limited by anxiety and distraction) Patient left: in chair;with call bell/phone within reach;with family/visitor present   Time: [id number] OT Time Calculation  (min): 37 min Charges:  OT General Charges $OT Visit: 1 Procedure OT Evaluation $OT Eval Moderate Complexity: 1 Procedure G-Codes:     Binnie Kand M.S., OTR/L  Pager: [phone]  06/05/2016, 11:32 AM

## 2016-06-05 NOTE — H&P (Signed)
Physical Medicine and Rehabilitation Admission H&P    Chief complaint: Back pain  HPI: [name] is a 70 y.o. right handed female with history of hypertension, polymyositis, rheumatoid arthritis with chronic prednisone,Sjogren's disease, chronic back pain with history of lumbar fusion 2014 L2-L5. Per chart review patient lives alone independent with assistive device prior to admission. One level home with level entry. She has assistance as needed from family. Presented 05/31/2016 with progressive back pain radiating to the lower extremities. X-rays and imaging revealed marked disc degeneration spondylosis, stenosis with radiculopathy. Underwent lumbar 5-sacral 1 anterior lumbar interbody fusion with anterior exposure thoracic 12-lumbar 1, lumbar 1-2 anterior lateral interbody fusion 06/02/2016 per Dr. Vertell Limber as well as Dr. Donnetta Hutching. Hospital course pain management. Cardiology services consulted 06/03/2016 for elevated troponin 0.18 and nonspecific chest pain. Workup suggested atypical chest pain elevated troponin likely due to demand ischemia of surgery and advised to begin aspirin 81 mg daily. Echocardiogram completed with ejection fraction of [id number] grade 1 diastolic dysfunction. No wall motion abnormalities. . Acute blood loss anemia 7.4 transfused latest hemoglobin 10.1. Physical and occupational therapy evaluations completed lumbar corset applied in sitting position. Recommendations for physical medicine rehabilitation consult. Patient was admitted for a comprehensive rehabilitation program  ROS Constitutional: Negative for fever and chills.  HENT: Negative for hearing loss.  Respiratory: Positive for cough. Negative for shortness of breath.  Cardiovascular: Positive for leg swelling.   Nonspecific chest pain  Gastrointestinal: Positive for constipation. Negative for nausea and vomiting.   GERD  Genitourinary: Positive for urgency. Negative for dysuria and hematuria.    Musculoskeletal: Positive for myalgias and joint pain.  Neurological: Negative for dizziness, seizures and headaches.   All other systems reviewed and are negative   Past Medical History  Diagnosis Date  . HTN (hypertension)   . Glaucoma   . Sjogren's syndrome ([place])   . Raynaud phenomenon   . Polymyositis ([place])   . GERD (gastroesophageal reflux disease)   . Swallowing difficulty     unable to swallow whole pills  . Iron deficiency anemia     hx  . High cholesterol     "not severe; can't take the medicine" (09/08/2013)  . Multiple thyroid nodules   . Migraines     "hurt like a migraine; don't get them like I did when I was younger" (09/08/2013)  . OA (osteoarthritis)   . Chronic lower back pain     "for 6 years; relieved since back OR 07/2013" (09/08/2013)  . Swelling of left lower extremity   . Pancreatitis   . Diverticulosis   . Shingles   . Osteoporosis   . Difficult intubation     "ive been told im small", no problems listed with back sx in past  . Family history of anesthesia complication     "sister has severe sleep apnea; she has alot of trouble w/anesthesia" (09/08/2013)  . AAA (abdominal aortic aneurysm) ([place])     small      report in epic 11/11 states no aaa    . History of hiatal hernia     "small one"  . 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 PHN (postherpetic neuralgia)     right side from shingles   Past Surgical History  Procedure Laterality Date  . Cataract extraction w/ intraocular lens  implant, bilateral Bilateral 12/2010-01/2011  . Esophageal dilation      "a few times before 02/2010; not since" (09/08/2013)  . Repair zenker's diverticula  02/2010    "  diverticulectomy w/zenker's pouch correction" (09/08/2013)  . Dental surgery  11/2011-12/2011    one on each side in my bottom jaw; dental implants" (09/08/2013)  . Lumbar fusion  Aug 14, 2013    Dr. Consuello Masse  . Esophageal manometry N/A 02/25/2016    Procedure: ESOPHAGEAL MANOMETRY (EM);  Surgeon: Manus Gunning, MD;  Location: [facility];  Service: Gastroenterology;  Laterality: N/A;  . Esophagogastroduodenoscopy    . Anterior lumbar fusion N/A 06/02/2016    Procedure: Lumbar five-sacral one Anterior lumbar interbody fusion with Dr. Donnetta Hutching for approach;  Surgeon: Erline Levine, MD;  Location: [place] NEURO ORS;  Service: Neurosurgery;  Laterality: N/A;  . Posterior lumbar fusion 4 level Bilateral 06/02/2016    Procedure: Exploration of Fusion Lumbar two-five, Thoracic ten-ileum posterior fusion, Posterolateral arthrodesis with AIRO;  Surgeon: Erline Levine, MD;  Location: [place] NEURO ORS;  Service: Neurosurgery;  Laterality: Bilateral;  . Application of intraoperative ct scan N/A 06/02/2016    Procedure: APPLICATION OF INTRAOPERATIVE CT SCAN;  Surgeon: Erline Levine, MD;  Location: [place] NEURO ORS;  Service: Neurosurgery;  Laterality: N/A;  . Anterior lat lumbar fusion  06/02/2016    Procedure: Thoracic twelve-Lumbar one, Lumbar one-two anterior Lateral Interbody Fusion;  Surgeon: Erline Levine, MD;  Location: [place] NEURO ORS;  Service: Neurosurgery;;  . Abdominal exposure N/A 06/02/2016    Procedure: ABDOMINAL EXPOSURE;  Surgeon: Rosetta Posner, MD;  Location: [facility]UR[facility] OR[facility];  Service: Vascular;  Laterality: N/A;   Family History  Problem Relation Age of Onset  . Ovarian cancer Mother   . Hypertension Father   . Heart disease Father   . Breast cancer Father     Age 71  . Colon cancer Father 71  . Psoriasis Sister   . Sarcoidosis Sister   . Arthritis Sister     psoriatic arthritis  . Diabetes Sister   . Arthritis Maternal Uncle     rheumatiod arthritis  . Diabetes Son   . Heart disease Brother    Social History:  reports that she has never smoked. She has never used smokeless tobacco. She reports that she does not drink alcohol or use illicit drugs. Allergies:  Allergies  Allergen Reactions  . Phenylephrine     Other reaction(s): Other (See Comments) -- phenylephrine eye drops only  Eye irritation,  severe photophobia   Medications Prior to Admission  Medication Sig Dispense Refill  . acetaminophen (TYLENOL) 160 MG/5ML elixir Take 500 mg by mouth every 4 (four) hours as needed for fever. Reported on 05/16/2016    . Calcium-Vitamin D-Vitamin K (VIACTIV PO) Take 2 each by mouth daily. Reported on 05/16/2016    . Cholecalciferol (VITAMIN D3) 5000 UNIT/ML LIQD Take 5,000 Units by mouth daily. Reported on 05/16/2016    . diclofenac (VOLTAREN) 75 MG EC tablet Take 75 mg by mouth 2 (two) times daily.    . diphenhydrAMINE (BENADRYL) 12.5 MG/5ML elixir Take 6.25 mg by mouth 4 (four) times daily as needed for sleep.    Marland Kitchen FIBER SELECT GUMMIES CHEW Chew 2 each by mouth daily.     . hydroxychloroquine (PLAQUENIL) 200 MG tablet Take 200 mg by mouth daily. Reported on 05/16/2016    . magnesium hydroxide (MILK OF MAGNESIA) 400 MG/5ML suspension Take 15 mLs by mouth daily as needed for mild constipation or heartburn. Reported on 05/16/2016    . omeprazole (PRILOSEC) 20 MG capsule Take 1 capsule (20 mg total) by mouth daily. Take one 30 min prior to breakfast. 30 capsule  3  . predniSONE (DELTASONE) 5 MG tablet Take 5 mg by mouth daily with breakfast.    . Tetrahydrozoline HCl (VISINE OP) Place 1 drop into both eyes daily as needed (dry eyes). Reported on 05/16/2016    . timolol (BETIMOL) 0.5 % ophthalmic solution Place 1 drop into both eyes at bedtime.    . traMADol (ULTRAM) 50 MG tablet Take 50 mg by mouth every morning.     . triamterene-hydrochlorothiazide (MAXZIDE-25) 37.5-25 MG tablet Take 1 tablet by mouth daily. Reported on 05/16/2016  0  . guaiFENesin (MUCINEX) 600 MG 12 hr tablet Take 600 mg by mouth 2 (two) times daily as needed for cough.    Marland Kitchen HYDROcodone-acetaminophen (HYCET) 7.5-325 mg/15 ml solution Take 10 mLs by mouth every 6 (six) hours as needed. pain  0  . methylPREDNISolone sodium succinate (SOLU-MEDROL) 1000 MG injection Inject 1,000 mg into the vein once. Reported on 05/16/2016       Home: Home Living Family/patient expects to be discharged to:: Unsure Living Arrangements: Alone Available Help at Discharge: Family, Available 24 hours/day Type of Home: House Home Access: Level entry Home Layout: One level Bathroom Shower/Tub:  (does have bars installed in bathroom) Bathroom Toilet: Standard Bathroom Accessibility: Yes Home Equipment: Walker - 4 wheels, Cane - single point, Bedside commode Additional Comments: Only sits on high surfaces  Lives With: Alone   Functional History: Prior Function Level of Independence: Independent with assistive device(s) Comments: Multiple adaptations for her chronic weakness and medical issues.   Functional Status:  Mobility: Bed Mobility Overal bed mobility: Needs Assistance Bed Mobility: Rolling, Sidelying to Sit Rolling: Min assist Sidelying to sit: Mod assist, HOB elevated Sit to sidelying: Max assist General bed mobility comments: assist for LE management off bed and trunk elevation; increased time. good demo of log roll. Transfers Overall transfer level: Needs assistance Equipment used: Rolling walker (2 wheeled) Transfers: Sit to/from Stand Sit to Stand: Mod assist, From elevated surface Stand pivot transfers: Mod assist General transfer comment: pt with unconventional way typically for getting up due to RA however pt able with bed elevated and legs in wide base of support and in extension able to achieve standing with min/modA from PT; stood from EOB x1, from [facility] x1, Transferred to chair post ambulation. Ambulation/Gait Ambulation/Gait assistance: Min assist Ambulation Distance (Feet): 100 Feet Assistive device: Rolling walker (2 wheeled) Gait Pattern/deviations: Step-through pattern, Decreased stride length, Wide base of support General Gait Details: labored effort with onset of fatigue, decreased step height, waddling like gait pattern Gait velocity: decreased  Gait velocity interpretation: Below normal speed  for age/gender    ADL: ADL Overall ADL's : Needs assistance/impaired Eating/Feeding: Set up, Sitting Grooming: Set up, Sitting, Wash/dry face, Brushing hair, Minimal assistance Grooming Details (indicate cue type and reason): Min assist for knot in back of head Upper Body Bathing: Minimal assitance, Sitting Lower Body Bathing: Maximal assistance, +2 for physical assistance, Sit to/from stand Upper Body Dressing : Maximal assistance, Sitting Upper Body Dressing Details (indicate cue type and reason): to don brace Lower Body Dressing: Maximal assistance Lower Body Dressing Details (indicate cue type and reason): to don socks Toilet Transfer: Moderate assistance, Stand-pivot, BSC Toilet Transfer Details (indicate cue type and reason): Simulated by transfer from EOB to chair. Pt with increased anxiety and distraction requiring max assist at end of functional mobility bout. Toileting- Clothing Manipulation and Hygiene: Maximal assistance, +2 for physical assistance, Sit to/from stand Functional mobility during ADLs: Moderate assistance (hand held assist, stand  pivot only) General ADL Comments: Pt requesting to sit on BSC in attempts to have a BM; RN notified pt on BSC. Pt reports she will call for assist when she is ready to get up. Continues to be very anxious; repeats "I just dont want to fall" "Please dont let me fall".  Cognition: Cognition Overall Cognitive Status: Within Functional Limits for tasks assessed Orientation Level: Oriented X4 Cognition Arousal/Alertness: Awake/alert Behavior During Therapy: Anxious Overall Cognitive Status: Within Functional Limits for tasks assessed  Physical Exam: Blood pressure 138/80, pulse 75, temperature 98.4 F (36.9 C), temperature source Oral, resp. rate 20, height 4' 10.5" (1.486 m), weight 56.6 kg (124 lb 12.5 oz), SpO2 100 %. Physical Exam Constitutional: She is oriented to person, place, and time. She appears well-developed and  well-nourished.  HENT: oral mucosa pink and moist Head: Normocephalic and atraumatic.  Eyes: Conjunctivae and EOM are normal.  Neck: Normal range of motion. Neck supple. No thyromegaly present.  Cardiovascular: Normal rate and regular rhythm.small SEM Respiratory: Effort normal and breath sounds normal. No respiratory distress.  GI: Soft. Bowel sounds are normal. She exhibits no distension.  Musculoskeletal: She exhibits edema and tenderness.  Arthritic changes in joints noted Neurological: She is alert and oriented to person, place, and time.  Sensation intact to light touch in all 4's. DTR's 1+ Motor: Bilateral upper extremities: 4 -/5 bicep,tricep to 4/5 distally. Left>right shoulders limited due to RA Right lower extremity: Hip flexion 2/5, knee extension 2+/5, ankle dorsi/plantar flexion 3/5 Left lower extremity: Hip flexion 1/5, knee extension 2/5, ankle dorsi/plantar flexion 2+/5  Skin: Skin is warm and dry.  Back incision is dressed with sero-sang drainage distally. Does have a residual rash right flank.   Psychiatric: She has a normal mood and affect. Her behavior is normal   No results found for this or any previous visit (from the past 48 hour(s)). No results found.     Medical Problem List and Plan: 1.  Decreased functional mobility secondary to lumbar stenosis with radiculopathy L5-S1 anterior lumbar interbody fusion with anterior exposure 06/02/2016. Lumbar corset when out of bed 2.  DVT Prophylaxis/Anticoagulation: SCDs. Check vascular studies upon admit. 3. Pain Management: Percocet and Robaxin as needed. Monitor with increased mobility/therapy  -kpad/ice prn  4. Acute blood loss anemia. Follow-up CBC. Continue iron supplement 5. Neuropsych: This patient is capable of making decisions on her own behalf. 6. Skin/Wound Care: Routine skin checks. Dry dressing/honeycomb to surgical site---persistent inferior serosanguinous drainage.  7. Fluids/Electrolytes/Nutrition:  Routine I&O's with follow-up chemistries upon admit. 8. Rheumatoid arthritis/polymyositis. Continue Plaquenil 200 mg daily, prednisone 5 mg daily 9. Hypertension. Lopressor 25 mg twice a day.    Post Admission Physician Evaluation: 1. Functional deficits secondary  to lumbar stenosis and radiculopathy s/p lumbar decompression and fusion. 2. Patient is admitted to receive collaborative, interdisciplinary care between the physiatrist, rehab nursing staff, and therapy team. 3. Patient's level of medical complexity and substantial therapy needs in context of that medical necessity cannot be provided at a lesser intensity of care such as a SNF. 4. Patient has experienced substantial functional loss from his/her baseline which was documented above under the "Functional History" and "Functional Status" headings.  Judging by the patient's diagnosis, physical exam, and functional history, the patient has potential for functional progress which will result in measurable gains while on inpatient rehab.  These gains will be of substantial and practical use upon discharge  in facilitating mobility and self-care at the household level. 5. Physiatrist will  provide 24 hour management of medical needs as well as oversight of the therapy plan/treatment and provide guidance as appropriate regarding the interaction of the two. 6. 24 hour rehab nursing will assist with bladder management, bowel management, safety, skin/wound care, disease management, medication administration, pain management and patient education  and help integrate therapy concepts, techniques,education, etc. 7. PT will assess and treat for/with: Lower extremity strength, range of motion, stamina, balance, functional mobility, safety, adaptive techniques and equipment, NMR, back precautions, pain mgt, activity tolerance.   Goals are: supervision to mod I with basic mobility and self-care. 8. OT will assess and treat for/with: ADL's, functional mobility,  safety, upper extremity strength, adaptive techniques and equipment, NMR, pain mgt, spine precautions, community reintegration.   Goals are: supervision/min asssist to mod I. Therapy may NOT YET proceed with showering this patient. 9. SLP will assess and treat for/with: n/a.  Goals are: n/a. 10. Case Management and Social Worker will assess and treat for psychological issues and discharge planning. 11. Team conference will be held weekly to assess progress toward goals and to determine barriers to discharge. 12. Patient will receive at least 3 hours of therapy per day at least 5 days per week. 13. ELOS: 8-11 days       14. Prognosis:  excellent     Meredith Staggers, MD, [place] [facility] 06/09/2016

## 2016-06-05 NOTE — Care Management Note (Signed)
Case Management Note  Patient Details  Name: SUMIE NESTA MRN: [id number] Date of Birth: [birth year]  Subjective/Objective:    Pt underwent:  Lumbar five-sacral one Anterior lumbar interbody fusion with Dr. Donnetta Hutching for approach. Exploration of Fusion Lumbar two-five, Thoracic ten-ileum posterior fusion, Posterolateral arthrodesis with AIRO (Bilateral) APPLICATION OF INTRAOPERATIVE CT SCAN. ABDOMINAL EXPOSURE --Thoracic twelve-Lumbar one, Lumbar one-two anterior Lateral Interbody fusion.   She is from home alone.         Action/Plan: PT recommendation is for CIR. CM following for d/c needs.  Expected Discharge Date:                  Expected Discharge Plan:  [place]  In-House Referral:     Discharge planning Services  CM Consult  Post Acute Care Choice:    Choice offered to:     DME Arranged:    DME Agency:     HH Arranged:    [place] Agency:     Status of Service:  In process, will continue to follow  Medicare Important Message Given:  Yes Date Medicare IM Given:    Medicare IM give by:    Date Additional Medicare IM Given:    Additional Medicare Important Message give by:     If discussed at [place] of Stay Meetings, dates discussed:    Additional Comments:  Pollie Friar, RN 06/05/2016, 11:17 AM

## 2016-06-05 NOTE — Progress Notes (Signed)
Physical Therapy Treatment Patient Details Name: [name] MRN: [id number] DOB: 07/12/1946 Today's Date: 06/05/2016    History of Present Illness Pt adm with scoliosis and spinal stenosis. Underwent L5-S1 anterior fusion, T10 to ileum posterior fusion, T12 - L2 lateral fusion on 6/16. PMH - lumbar fusion, HTN, Raynaud's, Sjogren's syndrome, polymyositis, RA, shingles with post neuralgic pain on rt flank    PT Comments    Pt extremely anxious regarding falling due to h/o many falls. Pt amb 10' with mod/maxAx2 with RW. Pt easily distracted, emotionally labile due to anniversary of death of husband, and frustration of not being able to ambulate. Pt however con't to be motivated and appropriate for CIR upon d/c as she strongly desires to return home with assist of her sons.   Follow Up Recommendations  CIR     Equipment Recommendations  None recommended by PT    Recommendations for Other Services Rehab consult     Precautions / Restrictions Precautions Precautions: Fall;Back Precaution Booklet Issued: Yes (comment) Precaution Comments: went over handout with son, Nicki Reaper, and pt, pt with good understanding Required Braces or Orthoses: Spinal Brace Spinal Brace: Lumbar corset;Applied in sitting position Restrictions Weight Bearing Restrictions: No    Mobility  Bed Mobility Overal bed mobility: Needs Assistance Bed Mobility: Sidelying to Sit Rolling:  (pt on L sidelying)       Sit to sidelying: Max assist General bed mobility comments: pt with increaed pain, minimally able to assist with UEs  Transfers Overall transfer level: Needs assistance Equipment used: 2 person hand held assist Transfers: Sit to/from Stand Sit to Stand: +2 physical assistance;Mod assist;From elevated surface         General transfer comment: pt extremely anxious, max verbal cues to relax, pulled up on PT and OT and then used RW  Ambulation/Gait Ambulation/Gait assistance: Mod assist;Max  assist;+2 physical assistance (plus chair follow) Ambulation Distance (Feet): 10 Feet Assistive device: Rolling walker (2 wheeled) Gait Pattern/deviations: Step-through pattern;Decreased stride length;Narrow base of support Gait velocity: decre Gait velocity interpretation: Below normal speed for age/gender General Gait Details: pt extremely anxious, pt easily distracted and very conversant, despite max v/c's to focus on ambulation. Once pt moving pt with sequenctial gait pattern with assist at pelvis to weight shift. once pt distarcted, difficulty to fet started again. pt begain to have increased anxiety and was unable to focus and required to sit as pt with strong L lateral lean onto OT requring total assist to maintain upright position   Stairs            Wheelchair Mobility    Modified Rankin (Stroke Patients Only)       Balance Overall balance assessment: Needs assistance Sitting-balance support: Bilateral upper extremity supported Sitting balance-Leahy Scale: Poor Sitting balance - Comments: unable to tolerate sitting without bilat UE support due to onset of pain and weak abdominal muscles   Standing balance support: Bilateral upper extremity supported Standing balance-Leahy Scale: Poor Standing balance comment: pt dependent on AD and/or personal assist                    Cognition Arousal/Alertness: Awake/alert Behavior During Therapy: WFL for tasks assessed/performed Overall Cognitive Status: Within Functional Limits for tasks assessed                      Exercises      General Comments        Pertinent Vitals/Pain Pain Assessment: 0-10 Pain Score: 6  Pain Location: back Pain Descriptors / Indicators: Grimacing Pain Intervention(s): Monitored during session    Home Living                      Prior Function            PT Goals (current goals can now be found in the care plan section) Acute Rehab PT Goals Patient Stated  Goal: walk Progress towards PT goals: Progressing toward goals    Frequency  Min 5X/week    PT Plan Current plan remains appropriate    Co-evaluation PT/OT/SLP Co-Evaluation/Treatment: Yes (OT came in 15 min later) Reason for Co-Treatment: Complexity of the patient's impairments (multi-system involvement) PT goals addressed during session: Mobility/safety with mobility       End of Session Equipment Utilized During Treatment: Gait belt;Back brace Activity Tolerance: Patient tolerated treatment well Patient left: in bed;with call bell/phone within reach;with nursing/sitter in room     Time: [id number] PT Time Calculation (min) (ACUTE ONLY): 50 min  Charges:  $Gait Training: 8-22 mins $Therapeutic Activity: 8-22 mins                    G Codes:      Kingsley Callander 06/05/2016, 11:00 AM   Kittie Plater, PT, DPT Pager #: [phone] Office #: [phone]

## 2016-06-05 NOTE — Progress Notes (Signed)
Patient WBC 11.1. Patient received 1 unit packed RBC 06/04/16. Patients LAC redness and swelling decreased. Patient denies any pain. MD notified. RN will continue to monitor.

## 2016-06-05 NOTE — Progress Notes (Signed)
Subjective: Patient reports "I feel pretty good. It's been an eventful weekend"  Objective: Vital signs in last 24 hours: Temp:  [97.9 F (36.6 C)-100.3 F (37.9 C)] 98.6 F (37 C) (06/19 0202) Pulse Rate:  [81-141] 87 (06/19 0202) Resp:  [14-27] 27 (06/19 0100) BP: (99-184)/(65-102) 144/89 mmHg (06/19 0203) SpO2:  [86 %-100 %] 100 % (06/19 0202)  Intake/Output from previous day: 06/18 0701 - 06/19 0700 In: 2690 [P.O.:500; I.V.:1600; Blood:335; IV Piggyback:105] Out: 310 [Urine:310] Intake/Output this shift:    Alert, conversant. Sons present. Reports some lumbar pain with position changes, but no leg pain. Hip flexors remain weak , but has been working with PT on gait. Incisions without erythema, swelling, or drainage beneath honeycomb and Dermabond. Hemovac patent, without drainage last 24hrs.  Hgb up to 10.1, Hct 29.9. No BM yet, but passing gas. Belly nontender.   Lab Results:  Recent Labs  06/04/16 0552 06/05/16 0230  WBC 8.2 11.1*  HGB 7.4* 10.1*  HCT 22.6* 29.9*  PLT 148* 158   BMET  Recent Labs  06/02/16 2140 06/03/16 1058  NA 133* 129*  K 3.5 3.4*  CL 102 101  CO2 23 21*  GLUCOSE 132* 125*  BUN 12 12  CREATININE 0.53 0.68  CALCIUM 8.3* 7.8*    Studies/Results: No results found.  Assessment/Plan: Improving   LOS: 3 days  Hopeful for CIR. D/C hemovac per DrStern. Ok to use home meds including eye drops and mouth moisturizer.    Verdis Prime 06/05/2016, 7:47 AM

## 2016-06-05 NOTE — Progress Notes (Signed)
Patient Name: MARKEITHA WAECHTER Date of Encounter: 06/05/2016  Hospital Problem List     Active Problems:   Back pain   Other secondary scoliosis, thoracolumbar region   Atypical chest pain    Subjective   Feeling well this morning.   Inpatient Medications    . sodium chloride   Intravenous Once  . cholecalciferol  5,000 Units Oral Daily  . docusate sodium  100 mg Oral BID  . famotidine  20 mg Oral Daily  . ferrous sulfate  325 mg Oral Q breakfast  . hydroxychloroquine  200 mg Oral Daily  . predniSONE  5 mg Oral Q breakfast  . sodium chloride flush  3 mL Intravenous Q12H  . timolol  1 drop Both Eyes QHS  . triamterene-hydrochlorothiazide  1 tablet Oral Daily    Vital Signs    Filed Vitals:   06/05/16 0030 06/05/16 0100 06/05/16 0202 06/05/16 0203  BP:  131/91 130/97 144/89  Pulse: 91 95 87   Temp:   98.6 F (37 C)   TempSrc:   Oral   Resp: 20 27    Height:      Weight:      SpO2: 97% 100% 100%     Intake/Output Summary (Last 24 hours) at 06/05/16 0852 Last data filed at 06/05/16 0100  Gross per 24 hour  Intake   2535 ml  Output    285 ml  Net   2250 ml   Filed Weights   06/02/16 0628 06/04/16 0300  Weight: 114 lb 13.8 oz (52.101 kg) 124 lb 12.5 oz (56.6 kg)    Physical Exam    General: Pleasant frail older female, NAD. Neuro: Alert and oriented X 3. Moves all extremities spontaneously. Psych: Normal affect. HEENT:  Normal  Neck: Supple without bruits or JVD. Lungs:  Resp regular and unlabored, CTA. Heart: RRR no s3, s4, or murmurs. Abdomen: Soft, non-tender, non-distended, BS + x 4.  Extremities: No clubbing, cyanosis, trace L>R LE edema. DP/PT/Radials 2+ and equal bilaterally.  Labs    CBC  Recent Labs  06/02/16 2140 06/03/16 1058 06/04/16 0552 06/05/16 0230  WBC 11.4* 8.8 8.2 11.1*  NEUTROABS 9.6* 6.7  --   --   HGB 10.2* 8.5* 7.4* 10.1*  HCT 30.3* 25.8* 22.6* 29.9*  MCV 81.0 81.4 81.0 81.5  PLT 231 187 148* 0000000   Basic Metabolic  Panel  Recent Labs  06/02/16 2140 06/03/16 1058  NA 133* 129*  K 3.5 3.4*  CL 102 101  CO2 23 21*  GLUCOSE 132* 125*  BUN 12 12  CREATININE 0.53 0.68  CALCIUM 8.3* 7.8*   Cardiac Enzymes  Recent Labs  06/02/16 2140  06/04/16 1521 06/04/16 2100 06/05/16 0230  CKTOTAL 350*  --   --   --   --   TROPONINI 0.04*  < > 0.90* 0.95* 0.81*  < > = values in this interval not displayed.   Telemetry    ST Rate-90-110  ECG    SR Rate-76 lateral TWI and LVH noted on previous tracings.  Radiology      Assessment & Plan    Patient is a 70 yo CA F with h/o multiple autoimmune diseases (including polymyositis and Sjogren'son chronic steroids), hypertension, and thoracolumbar scoliosis s/p extensive fusion lumbar fusion on 06/02/16. Cardiology was consulted, because yesterday evening, after the procedure, patient had a 2-3 minute episode of right-sided chest pressure. The symptoms resolved spontaneously and were in the setting of hypotension and her  pain medication being held. She was given stress-dose steroids.  1. Elevated Troponin/Chest pain: Patient is s/p extensive fusion lumbar fusion with Dr. Vertell Limber. Had a brief episode of chest pain after her procedure, but none since then. Did have an elevation in trop with downward trend and then spiked again. The last spike in troponin was in the setting of anemia requiring a transfusion.  --Has not had any further chest pain/pressure, reports feeling well today.  --2D echo is pending at this time. If normal, no plans for further cardiac work up  Signed, Reino Bellis NP-C Pager [phone]  As above, patient seen and examined; no chest pain or dyspnea; symptoms atypical but troponin minimally elevated and increased lateral TWI on ECG; await echo; if LV function normal, would plan nuclear study when she recovers from surgery. Would not draw further troponins. Add ASA 81 mg daily if ok with neurosurgery. Kirk Ruths

## 2016-06-05 NOTE — Consult Note (Signed)
Physical Medicine and Rehabilitation Consult Reason for Consult: Lumbar stenosis with radiculopathy Referring Physician: Dr. Vertell Limber   HPI: [name] is a 70 y.o. right handed female with history of hypertension, polymyositis, rheumatoid arthritis,Sjogren's disease, chronic back pain with history of lumbar fusion 2014 L2-L5. Per chart review patient lives alone independent with assistive device prior to admission. One level home with level entry. She has assistance as needed from family. Presented 05/31/2016 with progressive back pain radiating to the lower extremities. X-rays and imaging revealed marked disc degeneration spondylosis, stenosis with radiculopathy. Underwent lumbar 5-sacral 1 anterior lumbar interbody fusion with anterior exposure thoracic 12-lumbar 1, lumbar 1-2 anterior lateral interbody fusion 06/02/2016 per Dr. Vertell Limber as well as Dr. Donnetta Hutching. Hospital course pain management. Cardiology services consulted 06/03/2016 for elevated troponin 0.18 and nonspecific chest pain. Workup suggested atypical chest pain elevated troponin likely due to demand ischemia of surgery. Echocardiogram is pending. Acute blood loss anemia 7.4 transfused latest hemoglobin 10.1. Physical therapy evaluation completed lumbar corset applied in sitting position. Recommendations for physical medicine rehabilitation consult.   Review of Systems  Constitutional: Negative for fever and chills.  HENT: Negative for hearing loss.   Respiratory: Positive for cough. Negative for shortness of breath.   Cardiovascular: Positive for leg swelling.       Nonspecific chest pain  Gastrointestinal: Positive for constipation. Negative for nausea and vomiting.       GERD  Genitourinary: Positive for urgency. Negative for dysuria and hematuria.  Musculoskeletal: Positive for myalgias and joint pain.  Neurological: Negative for dizziness, seizures and headaches.  All other systems reviewed and are negative.  Past  Medical History  Diagnosis Date  . HTN (hypertension)   . Glaucoma   . Sjogren's syndrome ([place])   . Raynaud phenomenon   . Polymyositis ([place])   . GERD (gastroesophageal reflux disease)   . Swallowing difficulty     unable to swallow whole pills  . Iron deficiency anemia     hx  . High cholesterol     "not severe; can't take the medicine" (09/08/2013)  . Multiple thyroid nodules   . Migraines     "hurt like a migraine; don't get them like I did when I was younger" (09/08/2013)  . OA (osteoarthritis)   . Chronic lower back pain     "for 6 years; relieved since back OR 07/2013" (09/08/2013)  . Swelling of left lower extremity   . Pancreatitis   . Diverticulosis   . Shingles   . Osteoporosis   . Difficult intubation     "ive been told im small", no problems listed with back sx in past  . Family history of anesthesia complication     "sister has severe sleep apnea; she has alot of trouble w/anesthesia" (09/08/2013)  . AAA (abdominal aortic aneurysm) ([place])     small      report in epic 11/11 states no aaa    . History of hiatal hernia     "small one"  . 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 PHN (postherpetic neuralgia)     right side from shingles   Past Surgical History  Procedure Laterality Date  . Cataract extraction w/ intraocular lens  implant, bilateral Bilateral 12/2010-01/2011  . Esophageal dilation      "a few times before 02/2010; not since" (09/08/2013)  . Repair zenker's diverticula  02/2010    "diverticulectomy w/zenker's pouch correction" (09/08/2013)  . Dental surgery  11/2011-12/2011  one on each side in my bottom jaw; dental implants" (09/08/2013)  . Lumbar fusion  Aug 14, 2013    Dr. Consuello Masse  . Esophageal manometry N/A 02/25/2016    Procedure: ESOPHAGEAL MANOMETRY (EM);  Surgeon: Manus Gunning, MD;  Location: [facility];  Service: Gastroenterology;  Laterality: N/A;  . Esophagogastroduodenoscopy     Family History  Problem Relation Age of Onset  .  Ovarian cancer Mother   . Hypertension Father   . Heart disease Father   . Breast cancer Father     Age 87  . Colon cancer Father 83  . Psoriasis Sister   . Sarcoidosis Sister   . Arthritis Sister     psoriatic arthritis  . Diabetes Sister   . Arthritis Maternal Uncle     rheumatiod arthritis  . Diabetes Son   . Heart disease Brother    Social History:  reports that she has never smoked. She has never used smokeless tobacco. She reports that she does not drink alcohol or use illicit drugs. Allergies:  Allergies  Allergen Reactions  . Phenylephrine     Other reaction(s): Other (See Comments) -- phenylephrine eye drops only  Eye irritation, severe photophobia   Medications Prior to Admission  Medication Sig Dispense Refill  . acetaminophen (TYLENOL) 160 MG/5ML elixir Take 500 mg by mouth every 4 (four) hours as needed for fever. Reported on 05/16/2016    . Calcium-Vitamin D-Vitamin K (VIACTIV PO) Take 2 each by mouth daily. Reported on 05/16/2016    . Cholecalciferol (VITAMIN D3) 5000 UNIT/ML LIQD Take 5,000 Units by mouth daily. Reported on 05/16/2016    . diclofenac (VOLTAREN) 75 MG EC tablet Take 75 mg by mouth 2 (two) times daily.    . diphenhydrAMINE (BENADRYL) 12.5 MG/5ML elixir Take 6.25 mg by mouth 4 (four) times daily as needed for sleep.    Marland Kitchen FIBER SELECT GUMMIES CHEW Chew 2 each by mouth daily.     . hydroxychloroquine (PLAQUENIL) 200 MG tablet Take 200 mg by mouth daily. Reported on 05/16/2016    . magnesium hydroxide (MILK OF MAGNESIA) 400 MG/5ML suspension Take 15 mLs by mouth daily as needed for mild constipation or heartburn. Reported on 05/16/2016    . omeprazole (PRILOSEC) 20 MG capsule Take 1 capsule (20 mg total) by mouth daily. Take one 30 min prior to breakfast. 30 capsule 3  . predniSONE (DELTASONE) 5 MG tablet Take 5 mg by mouth daily with breakfast.    . Tetrahydrozoline HCl (VISINE OP) Place 1 drop into both eyes daily as needed (dry eyes). Reported on 05/16/2016     . timolol (BETIMOL) 0.5 % ophthalmic solution Place 1 drop into both eyes at bedtime.    . traMADol (ULTRAM) 50 MG tablet Take 50 mg by mouth every morning.     . triamterene-hydrochlorothiazide (MAXZIDE-25) 37.5-25 MG tablet Take 1 tablet by mouth daily. Reported on 05/16/2016  0  . guaiFENesin (MUCINEX) 600 MG 12 hr tablet Take 600 mg by mouth 2 (two) times daily as needed for cough.    Marland Kitchen HYDROcodone-acetaminophen (HYCET) 7.5-325 mg/15 ml solution Take 10 mLs by mouth every 6 (six) hours as needed. pain  0  . methylPREDNISolone sodium succinate (SOLU-MEDROL) 1000 MG injection Inject 1,000 mg into the vein once. Reported on 05/16/2016      Home: Home Living Family/patient expects to be discharged to:: Private residence Living Arrangements: Alone Available Help at Discharge: Family, Available 24 hours/day Type of Home: House Home Access: Level  entry Home Layout: One level Bathroom Shower/Tub: Tub/shower unit Home Equipment: Environmental consultant - 4 wheels, Cane - single point, Bedside commode Additional Comments: Only sits on high surfaces  Functional History: Prior Function Level of Independence: Independent with assistive device(s) Comments: Multiple adaptations for her chronic weakness and medical issues.  Functional Status:  Mobility: Bed Mobility Overal bed mobility: Needs Assistance Bed Mobility: Rolling, Sidelying to Sit, Sit to Sidelying Rolling: Max assist Sidelying to sit: +2 for physical assistance, Max assist Sit to sidelying: +2 for physical assistance, Mod assist General bed mobility comments: Assist to move legs and to elevate trunk into sitting. Assist to lower trunk and bring legs back up into bed. Transfers Overall transfer level: Needs assistance Equipment used: 4-wheeled walker Transfers: Sit to/from Stand Sit to Stand: +2 physical assistance, Mod assist, From elevated surface General transfer comment: Assist to bring hips up from elevated bed. Due to chonic weakness pt  uses wide base and has to lock knees into hyperextension to support weight. Then walks legs back underneath her. Ambulation/Gait Ambulation/Gait assistance: Min assist, +2 safety/equipment Ambulation Distance (Feet): 10 Feet Assistive device: 4-wheeled walker Gait Pattern/deviations: Step-through pattern, Decreased step length - right, Decreased step length - left, Steppage, Shuffle, Wide base of support General Gait Details: Pt with multiple compensatory strageties for gait due to chronic LE weakness. Keeps LE's externally rotated and knees hyperextended in stance. Difficulty bringing LLE through at times. Gait velocity: decr  Gait velocity interpretation: Below normal speed for age/gender    ADL:    Cognition: Cognition Overall Cognitive Status: Within Functional Limits for tasks assessed Orientation Level: Oriented X4 Cognition Arousal/Alertness: Awake/alert Behavior During Therapy: WFL for tasks assessed/performed Overall Cognitive Status: Within Functional Limits for tasks assessed  Blood pressure 144/89, pulse 87, temperature 98.6 F (37 C), temperature source Oral, resp. rate 27, height 4' 10.5" (1.486 m), weight 56.6 kg (124 lb 12.5 oz), SpO2 100 %. Physical Exam  Vitals reviewed. Constitutional: She is oriented to person, place, and time. She appears well-developed and well-nourished.  HENT:  Head: Normocephalic and atraumatic.  Eyes: Conjunctivae and EOM are normal.  Neck: Normal range of motion. Neck supple. No thyromegaly present.  Cardiovascular: Normal rate and regular rhythm.   Respiratory: Effort normal and breath sounds normal. No respiratory distress.  GI: Soft. Bowel sounds are normal. She exhibits no distension.  Musculoskeletal: She exhibits edema and tenderness.  Arthritic changes in joints  Neurological: She is alert and oriented to person, place, and time.  Sensation intact to light touch Motor: Bilateral upper extremities: 4 -/5 proximal to distal. Left  upper extremity and limited due to RA Right lower extremity: Hip flexion 2/5, knee extension 2+/5, ankle dorsi/plantar flexion 3/5 Left lower extremity: Hip flexion 1/5, knee extension 2/5, ankle dorsi/plantar flexion 2+/5  Skin: Skin is warm and dry.  Back incision is dressed appropriately tender  Psychiatric: She has a normal mood and affect. Her behavior is normal.    Results for orders placed or performed during the hospital encounter of 06/02/16 (from the past 24 hour(s))  Prepare RBC     Status: None   Collection Time: 06/04/16 10:29 AM  Result Value Ref Range   Order Confirmation ORDER PROCESSED BY BLOOD BANK   Troponin I     Status: Abnormal   Collection Time: 06/04/16  3:21 PM  Result Value Ref Range   Troponin I 0.90 (HH) <0.031 ng/mL  Troponin I     Status: Abnormal   Collection Time: 06/04/16  9:00 PM  Result Value Ref Range   Troponin I 0.95 (HH) <0.031 ng/mL  Troponin I     Status: Abnormal   Collection Time: 06/05/16  2:30 AM  Result Value Ref Range   Troponin I 0.81 (HH) <0.031 ng/mL  CBC     Status: Abnormal   Collection Time: 06/05/16  2:30 AM  Result Value Ref Range   WBC 11.1 (H) 4.0 - 10.5 K/uL   RBC 3.67 (L) 3.87 - 5.11 MIL/uL   Hemoglobin 10.1 (L) 12.0 - 15.0 g/dL   HCT 29.9 (L) 36.0 - 46.0 %   MCV 81.5 78.0 - 100.0 fL   MCH 27.5 26.0 - 34.0 pg   MCHC 33.8 30.0 - 36.0 g/dL   RDW 14.7 11.5 - 15.5 %   Platelets 158 150 - 400 K/uL   No results found.  Assessment/Plan: Diagnosis: Lumbar stenosis with radiculopathy Labs and images independently reviewed.  Records reviewed and summated above.  1. Does the need for close, 24 hr/day medical supervision in concert with the patient's rehab needs make it unreasonable for this patient to be served in a less intensive setting? Yes  2. Co-Morbidities requiring supervision/potential complications: HTN (monitor and provide prns in accordance with increased physical exertion and pain), polymyositis (cont meds),  rheumatoid arthritis (cont meds), Sjogren's disease (cont meds), chronic back pain with history of lumbar fusion 2014 L2-L5 (Biofeedback training with therapies to help reduce reliance on opiate pain medications, monitor pain control during therapies, and sedation at rest and titrate to maximum efficacy to ensure participation and gains in therapies), Acute blood loss anemia (transfuse if necessary to ensure appropriate perfusion for increased activity tolerance), leukocytosis (cont to monitor for signs and symptoms of infection, further workup if indicated), hypokalemia (continue to monitor and replete as necessary) 3. Due to safety, skin/wound care, disease management, pain management and patient education, does the patient require 24 hr/day rehab nursing? Yes 4. Does the patient require coordinated care of a physician, rehab nurse, PT (1-2 hrs/day, 5 days/week), OT (1-2 hrs/day, 5 days/week) and SLP (1-2 hrs/day, 5 days/week) to address physical and functional deficits in the context of the above medical diagnosis(es)? Yes Addressing deficits in the following areas: balance, endurance, locomotion, strength, transferring, bathing, dressing, toileting, speech, swallowing and psychosocial support 5. Can the patient actively participate in an intensive therapy program of at least 3 hrs of therapy per day at least 5 days per week? Potentially 6. The potential for patient to make measurable gains while on inpatient rehab is excellent 7. Anticipated functional outcomes upon discharge from inpatient rehab are min assist and mod assist  with PT, min assist and mod assist with OT, independent and modified independent with SLP. 8. Estimated rehab length of stay to reach the above functional goals is: 16-19 days. 9. Does the patient have adequate social supports and living environment to accommodate these discharge functional goals? Potentially 10. Anticipated D/C setting: Home 11. Anticipated post D/C treatments:  HH therapy and Home excercise program 12. Overall Rehab/Functional Prognosis: fair  RECOMMENDATIONS: This patient's condition is appropriate for continued rehabilitative care in the following setting: Will need to clarify availability of caregivers support at discharge. Patient with significant comorbidities at baseline, requiring assistance with ADLs.  She will unlikely be able to return to an independent level of living after a short IRF. Will also await completion of medical workup.  Patient has agreed to participate in recommended program. Yes Note that insurance prior authorization may be required for  reimbursement for recommended care.  Comment: Rehab Admissions Coordinator to follow up.  Delice Lesch, MD 06/05/2016

## 2016-06-05 NOTE — Progress Notes (Signed)
Echocardiogram 2D Echocardiogram has been performed.  Aggie Cosier 06/05/2016, 4:14 PM

## 2016-06-05 NOTE — Progress Notes (Signed)
Rehab admissions - I am following for potential acute inpatient rehab admission.  Please see rehab consult done by Dr. Posey Pronto today.  I will follow up with patient/family in am.  Call me for questions.  RC:[phone]

## 2016-06-05 NOTE — Progress Notes (Signed)
Orders placed for a home nebulizer machine. CM met with the patient and she states she does not need a nebulizer machine. Per patient she does not have asthma, COPD and doesn't use inhalers at home. CM notified RN that machine not ordered. CM will continue to follow.

## 2016-06-06 LAB — TYPE AND SCREEN
ABO/RH(D): O POS
Antibody Screen: NEGATIVE
UNIT DIVISION: 0
UNIT DIVISION: 0
UNIT DIVISION: 0
Unit division: 0

## 2016-06-06 LAB — BASIC METABOLIC PANEL
ANION GAP: 4 — AB (ref 5–15)
BUN: 6 mg/dL (ref 6–20)
CALCIUM: 8.5 mg/dL — AB (ref 8.9–10.3)
CO2: 23 mmol/L (ref 22–32)
CREATININE: 0.48 mg/dL (ref 0.44–1.00)
Chloride: 103 mmol/L (ref 101–111)
Glucose, Bld: 111 mg/dL — ABNORMAL HIGH (ref 65–99)
Potassium: 4.3 mmol/L (ref 3.5–5.1)
Sodium: 130 mmol/L — ABNORMAL LOW (ref 135–145)

## 2016-06-06 MED ORDER — METOPROLOL TARTRATE 25 MG/10 ML ORAL SUSPENSION
25.0000 mg | Freq: Two times a day (BID) | ORAL | Status: DC
  Administered 2016-06-06 – 2016-06-09 (×7): 25 mg via ORAL
  Filled 2016-06-06 (×9): qty 10

## 2016-06-06 MED ORDER — MAGNESIUM CITRATE PO SOLN
1.0000 | Freq: Once | ORAL | Status: AC
  Administered 2016-06-06: 1 via ORAL
  Filled 2016-06-06: qty 296

## 2016-06-06 NOTE — Progress Notes (Signed)
Rehab admissions - I met with patient's sons.  Patient was in a PT session.  I gave them rehab booklets.  Patient would like to admit to acute inpatient rehab.  I have called and opened the case with [employer] and I have faxed them clinical information.  I will update all once I hear back from insurance carrier.  Call me for questions.  #[phone] 

## 2016-06-06 NOTE — NC FL2 (Signed)
[place] LEVEL OF CARE SCREENING TOOL     IDENTIFICATION  Patient Name: [name] Birthdate: [birth year] Sex: female Admission Date (Current Location): 06/02/2016  [facility] and [place] Number:  Herbalist and Address:  [facility], [place] [address] [place], [place] [address]      Provider Number: [id number]  Attending Physician Name and Address:  Erline Levine, MD  Relative Name and Phone Number:       Current Level of Care: Hospital Recommended Level of Care: [place] Prior Approval Number:    Date Approved/Denied:   PASRR Number:  [id number])  Discharge Plan: SNF    Current Diagnoses: Patient Active Problem List   Diagnosis Date Noted  . Acute blood loss anemia   . Chronic low back pain   . Failed back syndrome   . Leukocytosis   . Polymyositis ([place])   . Rheumatoid arthritis ([place])   . Sjogren's disease ([place])   . Surgery, elective   . Atypical chest pain   . Back pain 06/02/2016  . 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 Dysphagia   . Swelling of left lower extremity 10/15/2013  . Essential hypertension 10/15/2013  . UTI (lower urinary tract infection) 09/08/2013  . Elevated lipase 09/08/2013  . Weakness 09/08/2013  . Dehydration 09/08/2013  . S/P lumbar spinal fusion 09/08/2013  . Fever 09/08/2013  . Nausea 09/08/2013  . Headache 09/08/2013  . Hypokalemia 09/08/2013  . Hyponatremia 09/08/2013  . Anemia 09/08/2013  . Pancreatitis 09/08/2013  . Difficulty swallowing pills 09/08/2013  . Unspecified deficiency anemia 03/14/2013  . Uterine prolapse 04/16/2012  . Cystocele 04/16/2012  . Rectocele 04/16/2012  . Osteopenia   . IRON DEFICIENCY 01/12/2010  . 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 DYSPHAGIA UNSPECIFIED 01/12/2010    Orientation RESPIRATION BLADDER Height & Weight     Self, Time, Situation, Place  Normal Continent Weight: 124 lb 12.5 oz (56.6  kg) Height:  4' 10.5" (148.6 cm)  BEHAVIORAL SYMPTOMS/MOOD NEUROLOGICAL BOWEL NUTRITION STATUS   (NONE )  (NONE ) Continent Diet (DYS 3 )  AMBULATORY STATUS COMMUNICATION OF NEEDS Skin   Extensive Assist Verbally Surgical wounds                       Personal Care Assistance Level of Assistance  Bathing, Feeding, Dressing Bathing Assistance: Maximum assistance Feeding assistance: Independent Dressing Assistance: Maximum assistance     Functional Limitations Info  Sight, Hearing, Speech Sight Info: Adequate Hearing Info: Adequate Speech Info: Adequate    SPECIAL CARE FACTORS FREQUENCY  PT (By licensed PT), OT (By licensed OT)     PT Frequency: 5 OT Frequency: 2            Contractures      Additional Factors Info  Code Status, Allergies Code Status Info: FULL CODE  Allergies Info: Phenylephrine           Current Medications (06/06/2016):  This is the current hospital active medication list Current Facility-Administered Medications  Medication Dose Route Frequency Provider Last Rate Last Dose  . 0.9 %  sodium chloride infusion  250 mL Intravenous Continuous Erline Levine, MD      . 0.9 %  sodium chloride infusion   Intravenous Once Newman Pies, MD      . 0.9 % NaCl with KCl 20 mEq/ L  infusion   Intravenous Continuous Erline Levine, MD 100 mL/hr at 06/04/16 0236    .  acetaminophen (TYLENOL) solution 500 mg  500 mg Oral Q4H PRN Erline Levine, MD   500 mg at 06/04/16 2000  . acetaminophen (TYLENOL) suppository 650 mg  650 mg Rectal Q4H PRN Erline Levine, MD      . alum & mag hydroxide-simeth (MAALOX/MYLANTA) 200-200-20 MG/5ML suspension 30 mL  30 mL Oral Q6H PRN Erline Levine, MD      . bisacodyl (DULCOLAX) suppository 10 mg  10 mg Rectal Daily PRN Erline Levine, MD   10 mg at 06/06/16 0801  . cholecalciferol (VITAMIN D) tablet 5,000 Units  5,000 Units Oral Daily Erline Levine, MD   5,000 Units at 06/05/16 1036  . docusate sodium (COLACE) capsule 100 mg  100 mg Oral  BID Erline Levine, MD   100 mg at 06/05/16 2228  . famotidine (PEPCID) 40 MG/5ML suspension 20 mg  20 mg Oral Daily Erline Levine, MD   20 mg at 06/05/16 1035  . ferrous sulfate tablet 325 mg  325 mg Oral Q breakfast Newman Pies, MD   325 mg at 06/05/16 1036  . HYDROcodone-acetaminophen (HYCET) 7.5-325 mg/15 ml solution 15 mL  15 mL Oral Q4H PRN Newman Pies, MD   15 mL at 06/03/16 0940  . HYDROmorphone (DILAUDID) injection 0.5-1 mg  0.5-1 mg Intravenous Q2H PRN Erline Levine, MD   1 mg at 06/02/16 2333  . hydroxychloroquine (PLAQUENIL) tablet 200 mg  200 mg Oral Daily Erline Levine, MD   200 mg at 06/02/16 1957  . magnesium hydroxide (MILK OF MAGNESIA) suspension 15 mL  15 mL Oral Daily PRN Erline Levine, MD      . menthol-cetylpyridinium (CEPACOL) lozenge 3 mg  1 lozenge Oral PRN Erline Levine, MD       Or  . phenol (CHLORASEPTIC) mouth spray 1 spray  1 spray Mouth/Throat PRN Erline Levine, MD   1 spray at 06/04/16 1317  . methocarbamol (ROBAXIN) tablet 500 mg  500 mg Oral Q6H PRN Erline Levine, MD       Or  . methocarbamol (ROBAXIN) 500 mg in dextrose 5 % 50 mL IVPB  500 mg Intravenous Q6H PRN Erline Levine, MD   500 mg at 06/04/16 2219  . ondansetron (ZOFRAN) injection 4 mg  4 mg Intravenous Q4H PRN Erline Levine, MD   4 mg at 06/05/16 0405  . oxyCODONE (ROXICODONE) 5 MG/5ML solution 5 mg  5 mg Oral Q4H PRN Erline Levine, MD   5 mg at 06/06/16 [id number]  . oxyCODONE-acetaminophen (PERCOCET/ROXICET) 5-325 MG per tablet 1-2 tablet  1-2 tablet Oral Q4H PRN Erline Levine, MD   2 tablet at 06/05/16 2237  . polyethylene glycol (MIRALAX / GLYCOLAX) packet 17 g  17 g Oral Daily PRN Erline Levine, MD      . predniSONE (DELTASONE) tablet 5 mg  5 mg Oral Q breakfast Erline Levine, MD   5 mg at 06/05/16 1036  . sodium chloride flush (NS) 0.9 % injection 3 mL  3 mL Intravenous Q12H Erline Levine, MD   3 mL at 06/05/16 1000  . sodium chloride flush (NS) 0.9 % injection 3 mL  3 mL Intravenous PRN Erline Levine, MD      .  sodium phosphate (FLEET) 7-19 GM/118ML enema 1 enema  1 enema Rectal Once PRN Erline Levine, MD      . tetrahydrozoline 0.05 % ophthalmic solution 2 drop  2 drop Both Eyes Daily PRN Erline Levine, MD   2 drop at 06/02/16 1956  . timolol (TIMOPTIC) 0.5 % ophthalmic  solution 1 drop  1 drop Both Eyes QHS Erline Levine, MD   1 drop at 06/05/16 2237  . traMADol (ULTRAM) tablet 50 mg  50 mg Oral Q6H PRN Erline Levine, MD      . triamterene-hydrochlorothiazide [facility]) 37.5-25 MG per tablet 1 tablet  1 tablet Oral Daily Erline Levine, MD   Stopped at 06/03/16 1000     Discharge Medications: Please see discharge summary for a list of discharge medications.  Relevant Imaging Results:  Relevant Lab Results:   Additional Information SSN [id number]  Glendon Axe, MSW, LCSWA [phone] 06/06/2016 9:08 AM

## 2016-06-06 NOTE — Progress Notes (Signed)
Patient: [name] / Admit Date: 06/02/2016 / Date of Encounter: 06/06/2016, 11:05 AM   Subjective: C/o constipation. No CP or SOB.   Objective: Telemetry: NSR/borderline sinus tach Physical Exam: Blood pressure 162/89, pulse 83, temperature 98.6 F (37 C), temperature source Oral, resp. rate 20, height 4' 10.5" (1.486 m), weight 124 lb 12.5 oz (56.6 kg), SpO2 92 %. General: Well developed, well nourished WF, in no acute distress. Head: Normocephalic, atraumatic, sclera non-icteric, no xanthomas, nares are without discharge. Neck: Negative for carotid bruits. JVP not elevated. Lungs: Clear bilaterally to auscultation without wheezes, rales, or rhonchi. Breathing is unlabored. Heart: RRR S1 S2 without murmurs, rubs, or gallops.  Abdomen: Soft, non-tender, non-distended with normoactive bowel sounds. No rebound/guarding. Extremities: No clubbing or cyanosis. No edema. Distal pedal pulses are 2+ and equal bilaterally. Neuro: Alert and oriented X 3. Moves all extremities spontaneously. Psych:  Responds to questions appropriately with a normal affect.  No intake or output data in the 24 hours ending 06/06/16 1105  Inpatient Medications:  . sodium chloride   Intravenous Once  . cholecalciferol  5,000 Units Oral Daily  . docusate sodium  100 mg Oral BID  . famotidine  20 mg Oral Daily  . ferrous sulfate  325 mg Oral Q breakfast  . hydroxychloroquine  200 mg Oral Daily  . predniSONE  5 mg Oral Q breakfast  . sodium chloride flush  3 mL Intravenous Q12H  . timolol  1 drop Both Eyes QHS  . triamterene-hydrochlorothiazide  1 tablet Oral Daily   Infusions:  . sodium chloride    . 0.9 % NaCl with KCl 20 mEq / L 100 mL/hr at 06/04/16 0236    Labs: No results for input(s): NA, K, CL, CO2, GLUCOSE, BUN, CREATININE, CALCIUM, MG, PHOS in the last 72 hours. No results for input(s): AST, ALT, ALKPHOS, BILITOT, PROT, ALBUMIN in the last 72 hours.  Recent Labs  06/04/16 0552  06/05/16 0230  WBC 8.2 11.1*  HGB 7.4* 10.1*  HCT 22.6* 29.9*  MCV 81.0 81.5  PLT 148* 158    Recent Labs  06/04/16 1521 06/04/16 2100 06/05/16 0230 06/05/16 0818  TROPONINI 0.90* 0.95* 0.81* 0.53*   Invalid input(s): POCBNP No results for input(s): HGBA1C in the last 72 hours.   Radiology/Studies:  Dg Lumbar Spine Complete  06/02/2016  CLINICAL DATA:  Lumbar spine surgery, L1-L2 XL IF, L5-S1 ALI EXAM: DG C-ARM 61-120 MIN; LUMBAR SPINE - COMPLETE 4+ VIEW COMPARISON:  Intraoperative images compared to preoperative MR 02/17/2016 FLUOROSCOPY TIME:  3 minutes 21 seconds Images obtained: 4 FINDINGS: Prior MR labeled with 5 lumbar vertebra. Prior posterior fusion of L2-L5 with BILATERAL pedicle screws and posterior bars. Disc prostheses present at T12-L1 and L1-L2. Interval anterior fusion of L5-S1. Bones significantly demineralized. Vertebral body heights maintained. IMPRESSION: New disc prostheses at T12-L1 and L1-L2 with additional anterior hardware at L5-S1. Old posterior fusion L2-L5. Electronically Signed   By: Lavonia Dana M.D.   On: 06/02/2016 17:22   Dg C-arm 61-120 Min  06/02/2016  CLINICAL DATA:  Lumbar spine surgery, L1-L2 XL IF, L5-S1 ALI EXAM: DG C-ARM 61-120 MIN; LUMBAR SPINE - COMPLETE 4+ VIEW COMPARISON:  Intraoperative images compared to preoperative MR 02/17/2016 FLUOROSCOPY TIME:  3 minutes 21 seconds Images obtained: 4 FINDINGS: Prior MR labeled with 5 lumbar vertebra. Prior posterior fusion of L2-L5 with BILATERAL pedicle screws and posterior bars. Disc prostheses present at T12-L1 and L1-L2. Interval anterior fusion of L5-S1. Bones significantly demineralized. Vertebral  body heights maintained. IMPRESSION: New disc prostheses at T12-L1 and L1-L2 with additional anterior hardware at L5-S1. Old posterior fusion L2-L5. Electronically Signed   By: Lavonia Dana M.D.   On: 06/02/2016 17:22   Dg Ct Oarm Limited Study  06/02/2016  CLINICAL DATA:  Intra-op CT was utilized by  requesting physician. No radiographic interpretation.   Dg Or Local Abdomen  06/02/2016  CLINICAL DATA:  Status post ALIF, no instrument count EXAM: OR LOCAL ABDOMEN COMPARISON:  02/17/2016 FINDINGS: Postsurgical changes are again seen from L2 to L5 and stable. New anterior fixation is noted at L5-S1. No retained foreign body is noted. IMPRESSION: Postoperative changes are noted. No radiopaque foreign body is noted. These results were called by telephone at the time of interpretation on 06/02/2016 at 10:22 am to [facility] in [facility] OR 4, who verbally acknowledged these results. Electronically Signed   By: Inez Catalina M.D.   On: 06/02/2016 10:22     Assessment and Plan  71F with h/o multiple autoimmune diseases (including polymyositis and Sjogren'son chronic steroids), hypertension, chronic-appearing anemia, chronic appearing hyponatremia, and thoracolumbar scoliosis s/p extensive lumbar fusion on 06/02/16. Cardiology consulted for 2-3 minutes of right-sided chest pressure in the setting of hypotension and pain medication being held. Troponins rose to 0.95. 2D Echo 06/05/16: EF 65-70%, grade 1 DD, PASP 32, RV normal.  1. Chest pain/elevated troponin - ? due to hypotension. Will review echo with Dr. Stanford Breed. LV function normal. Recommend to add aspirin 81mg  daily when OK with neuro surgery. Check lipids in AM along with baseline LFTs for risk stratification but would not add statin at this time given history of polymyositis and elevated CK.  2. Electrolyte abnormalities including chronic hyponatremia, new hypokalemia on 6/17 - do not see where this was repleted or followed. Recheck BMET and supplement K if needed. Further management of lytes per primary team.  3. HTN - patient states she is no longer on Maxzide due to hyponatremia; will d/c. Will discuss antihypertensive with Dr. Stanford Breed - consider addition of BB versus amlodipine. She prefers liquid formulation due to h/o Zenker's diverticulum (i.e. metoprolol  suspension is an option).  Signed, Melina Copa PA-C Pager: [phone]   As above; pt seen and examined; no further CP; echo shows normal LV function; plan outpt nuclear study when she recovers from surgery. Add metoprolol for elevated BP. Kirk Ruths

## 2016-06-06 NOTE — Progress Notes (Signed)
   06/06/16 1105  Clinical Encounter Type  Visited With Patient and family together  Visit Type Follow-up  Spiritual Encounters  Spiritual Needs Emotional;Grief support  Stress Factors  Patient Stress Factors Health changes  Family Stress Factors Not reviewed  Chaplain made follow up visit with patient during morning rounds.  Two of patient's sons were present.  Patient expressed disappointment in insurance company regarding rehabilitation approval.  Chaplain and patient discussed her prayer requests. Chaplain will visit this afternoon.

## 2016-06-06 NOTE — Clinical Social Work Note (Signed)
Clinical Social Work Assessment  Patient Details  Name: DRAKE LANDING MRN: [id number] Date of Birth: 11/21/1946  Date of referral:                  Reason for consult:  Discharge Planning                Permission sought to share information with:  Family Supports, Case Manager Permission granted to share information::  Yes, Verbal Permission Granted  Name::      Legrand Como and Air traffic controller )  Agency::   (N/A )  Relationship::   (Sons)  Contact Information:   [phone])  Housing/Transportation Living arrangements for the past 2 months:  Single Family Home Source of Information:  Patient, Adult Children Patient Interpreter Needed:  None Criminal Activity/Legal Involvement Pertinent to Current Situation/Hospitalization:  No - Comment as needed Significant Relationships:  Adult Children Lives with:  Self Do you feel safe going back to the place where you live?  Yes Need for family participation in patient care:  Yes (Comment)  Care giving concerns:  PT currently recommending IP REHAB. CSW to assess caregiver support at home and present alternative d/c plan.    Social Worker assessment / plan:  Holiday representative met with patient and sons, Legrand Como and Scotty at bedside in reference to post-acute placement for SNF. CSW introduced CSW role and SNF process. CSW also explained differences between IP REHAB vs SNF. Patient and family reported that they are not interested and SNF placement and in the event patient cannot be admitted in IP REHAB adult children plan to take patient home with home health and care for her 24/7. Patient does have caregiver support once d/c'ed from CIR.   During assessment CSW also discussed insurance coverage/pending authorization. [place]-2 was completed however NOT faxed via [place].   Disposition: CIR vs Home Health. CSW to notify RNCM.   Clinical Social Worker will sign off for now as social work intervention is no longer needed. Please consult [place] again if new need  arises.  Employment status:  Retired Nurse, adult PT Recommendations:  Inpatient [place] / Referral to community resources:   (None )  Patient/Family's Response to care:  Patient sitting up in bed a/o x4. Patient and family has declined SNF placement and prefer to take patient home with home health care if patient cannot be admitted into IP REHAB. Patient with strong family support and appreciated social work intervention.   Patient/Family's Understanding of and Emotional Response to Diagnosis, Current Treatment, and Prognosis:  Pt and family knowledgeable of medical work up and continuity of care.   Emotional Assessment Appearance:  Appears stated age Attitude/Demeanor/Rapport:   (Pleasant ) Affect (typically observed):  Pleasant, Appropriate Orientation:  Oriented to Situation, Oriented to  Time, Oriented to Place, Oriented to Self Alcohol / Substance use:  Not Applicable Psych involvement (Current and /or in the community):  No (Comment)  Discharge Needs  Concerns to be addressed:  Denies Needs/Concerns at this time Readmission within the last 30 days:  No Current discharge risk:  Dependent with Mobility Barriers to Discharge:  No Barriers Identified   Glendon Axe, MSW, LCSWA [phone] 06/06/2016 11:42 AM

## 2016-06-06 NOTE — Progress Notes (Signed)
Physical Therapy Treatment Patient Details Name: [name] MRN: [id number] DOB: [birth year] Today's Date: 06/06/2016    History of Present Illness Pt adm with scoliosis and spinal stenosis. Underwent L5-S1 anterior fusion, T10 to ileum posterior fusion, T12 - L2 lateral fusion on 6/16. 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 Acute PT to con't to follow pt.  Follow Up Recommendations  CIR     Equipment Recommendations  None recommended by PT    Recommendations for Other Services Rehab consult     Precautions / Restrictions Precautions Precautions: Fall;Back Precaution Booklet Issued: Yes (comment) Precaution Comments: pt re-educated Required Braces or Orthoses: Spinal Brace Spinal Brace: Lumbar corset;Applied in sitting position Restrictions Weight Bearing Restrictions: No    Mobility  Bed Mobility Overal bed mobility: Needs Assistance Bed Mobility: Rolling;Sidelying to Sit Rolling: Min assist Sidelying to sit: Min assist       General bed mobility comments: assist to initiate rolling, assist for trunk elevation due to pain  Transfers Overall transfer level: Needs assistance Equipment used: 2 person hand held assist Transfers: Sit to/from Stand Sit to Stand: +2 physical assistance;Mod assist;From elevated surface         General transfer comment: pt extremely anxious, max verbal cues to relax, pulled up on PT and OT and then used RW  Ambulation/Gait Ambulation/Gait assistance: Min assist;+2 safety/equipment (chair follow) Ambulation Distance (Feet): 75 Feet Assistive device: Rolling walker (2 wheeled) Gait Pattern/deviations: Step-through pattern;Steppage Gait velocity: decreased Gait velocity interpretation: Below normal speed for  age/gender General Gait Details: v/c's to stay focused on ambulation to minimize distractions. pt con't to have decreased step height. 2 standing rest breaks. pt with questions regarding walker management. Pt with decreased bilat step height.   Stairs            Wheelchair Mobility    Modified Rankin (Stroke Patients Only)       Balance Overall balance assessment: Needs assistance Sitting-balance support: Bilateral upper extremity supported Sitting balance-Leahy Scale: Poor Sitting balance - Comments: unable to tolerate sitting without bilat UE support due to onset of pain and weak abdominal muscles   Standing balance support: Bilateral upper extremity supported Standing balance-Leahy Scale: Poor Standing balance comment: dependent on RW                    Cognition Arousal/Alertness: Awake/alert Behavior During Therapy: Anxious Overall Cognitive Status: Within Functional Limits for tasks assessed                      Exercises      General Comments        Pertinent Vitals/Pain Pain Assessment: 0-10 Pain Score: 6  Pain Location: back Pain Descriptors / Indicators: Grimacing Pain Intervention(s): Monitored during session    Home Living                      Prior Function            PT Goals (current goals can now be found in the care plan section) Acute Rehab PT Goals Patient Stated Goal: walker Progress towards PT goals: Progressing toward goals    Frequency  Min 5X/week    PT Plan Current plan remains appropriate    Co-evaluation  End of Session Equipment Utilized During Treatment: Gait belt;Back brace Activity Tolerance: Patient tolerated treatment well Patient left: in bed;with call bell/phone within reach;with nursing/sitter in room     Time: 1155-1231 PT Time Calculation (min) (ACUTE ONLY): 36 min  Charges:  $Gait Training: 23-37 mins                    G Codes:      Kingsley Callander 06/06/2016, 2:56 PM   Kittie Plater, PT, DPT Pager #: [phone] Office #: [phone]

## 2016-06-06 NOTE — Progress Notes (Signed)
Subjective: Patient reports "I'm doing alright I think. I'm passing gas but I haven't had a bowel movement yet"  Objective: Vital signs in last 24 hours: Temp:  [97.5 F (36.4 C)-100.1 F (37.8 C)] 98.6 F (37 C) (06/20 0906) Pulse Rate:  [76-98] 83 (06/20 0906) Resp:  [16-20] 20 (06/20 0906) BP: (130-162)/(66-99) 162/89 mmHg (06/20 0906) SpO2:  [92 %-100 %] 92 % (06/20 0906)  Intake/Output from previous day: 06/19 0701 - 06/20 0700 In: 150  Out: -  Intake/Output this shift:    Alert, conversant. Sons present. Reports only mild lumbar pain with positin changes. No leg pain. No buttock pain. Incisions without erythema, swelling, or drainage beneath honeycomb & Dermabond. Belly slightly distended, nontender. Passing gas often. Good strength BLE; remains weak bilat hip flexors - working with PT.  She mentions an odor to her urine and reports occasional burning with urination, recalling frequent UTI hx.   Lab Results:  Recent Labs  06/04/16 0552 06/05/16 0230  WBC 8.2 11.1*  HGB 7.4* 10.1*  HCT 22.6* 29.9*  PLT 148* 158   BMET  Recent Labs  06/06/16 1305  NA 130*  K 4.3  CL 103  CO2 23  GLUCOSE 111*  BUN 6  CREATININE 0.48  CALCIUM 8.5*    Studies/Results: No results found.  Assessment/Plan: Improving   LOS: 4 days  She will request Fleets enema. Hopeful of insurance approval for CIR. Per DrStern, urine for U/A, C&S. Order entered.    Verdis Prime 06/06/2016, 2:20 PM

## 2016-06-07 ENCOUNTER — Encounter (HOSPITAL_COMMUNITY): Payer: Self-pay | Admitting: Neurosurgery

## 2016-06-07 LAB — LIPID PANEL
CHOL/HDL RATIO: 4.1 ratio
CHOLESTEROL: 130 mg/dL (ref 0–200)
HDL: 32 mg/dL — AB (ref >40)
LDL Cholesterol: 81 mg/dL (ref 0–99)
Triglycerides: 84 mg/dL (ref <150)
VLDL: 17 mg/dL (ref 0–40)

## 2016-06-07 LAB — COMPREHENSIVE METABOLIC PANEL
ALK PHOS: 50 U/L (ref 38–126)
ALT: 10 U/L — AB (ref 14–54)
AST: 22 U/L (ref 15–41)
Albumin: 1.9 g/dL — ABNORMAL LOW (ref 3.5–5.0)
Anion gap: 7 (ref 5–15)
CALCIUM: 8.7 mg/dL — AB (ref 8.9–10.3)
CO2: 25 mmol/L (ref 22–32)
CREATININE: 0.43 mg/dL — AB (ref 0.44–1.00)
Chloride: 100 mmol/L — ABNORMAL LOW (ref 101–111)
GFR calc non Af Amer: >60 mL/min (ref >60)
GLUCOSE: 104 mg/dL — AB (ref 65–99)
Potassium: 3.9 mmol/L (ref 3.5–5.1)
SODIUM: 132 mmol/L — AB (ref 135–145)
Total Bilirubin: 0.7 mg/dL (ref 0.3–1.2)
Total Protein: 4.4 g/dL — ABNORMAL LOW (ref 6.5–8.1)

## 2016-06-07 NOTE — Progress Notes (Signed)
Occupational Therapy Treatment Patient Details Name: [name] MRN: [id number] DOB: [birth year] Today's Date: 06/07/2016    History of present illness Pt adm with scoliosis and spinal stenosis. Underwent L5-S1 anterior fusion, T10 to ileum posterior fusion, T12 - L2 lateral fusion on 6/16. 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 Pt able to perform stand pivot transfer to [facility] with mod assist +1. Pt requires min assist for bed mobility at this time and max assist to don brace and socks. Pt able to verbally recall 3/3 back precautions but requires VCs during activity to maintain. D/c plan remains appropriate. Will continue to follow acutely.   Follow Up Recommendations  CIR;Supervision/Assistance - 24 hour    Equipment Recommendations  Other (comment) (TBD at next venue)    Recommendations for Other Services      Precautions / Restrictions Precautions Precautions: Fall;Back Precaution Booklet Issued: No Precaution Comments: pt able to verbally recall 3/3 precautions. Needs cueing during functional activities. Required Braces or Orthoses: Spinal Brace Spinal Brace: Lumbar corset;Applied in sitting position Restrictions Weight Bearing Restrictions: No       Mobility Bed Mobility Overal bed mobility: Needs Assistance Bed Mobility: Rolling;Sidelying to Sit Rolling: Min assist Sidelying to sit: Min assist       General bed mobility comments: Assist to bring LEs off EOB and elevate trunk to sitting. Assist to scoot hips to EOB.  Transfers Overall transfer level: Needs assistance Equipment used: 1 person hand held assist Transfers: Sit to/from [facility] Sit to Stand: Mod assist;From elevated surface Stand pivot transfers: Mod assist            Balance  Overall balance assessment: Needs assistance Sitting-balance support: Feet supported;Bilateral upper extremity supported Sitting balance-Leahy Scale: Fair     Standing balance support: Bilateral upper extremity supported Standing balance-Leahy Scale: Poor Standing balance comment: Requires bil UE support in standing.                   ADL Overall ADL's : Needs assistance/impaired                 Upper Body Dressing : Maximal assistance;Sitting Upper Body Dressing Details (indicate cue type and reason): to don brace Lower Body Dressing: Maximal assistance Lower Body Dressing Details (indicate cue type and reason): to don socks Toilet Transfer: Moderate assistance;Stand-pivot;BSC           Functional mobility during ADLs: Moderate assistance (hand held assist, stand pivot only) General ADL Comments: Pt requesting to sit on BSC in attempts to have a BM; RN notified pt on BSC. Pt reports she will call for assist when she is ready to get up. Continues to be very anxious; repeats "I just dont want to fall" "Please dont let me fall".      Vision                     Perception     Praxis      Cognition   Behavior During Therapy: Anxious Overall Cognitive Status: Within Functional Limits for tasks assessed                       Extremity/Trunk Assessment               Exercises  Shoulder Instructions       General Comments      Pertinent Vitals/ Pain       Pain Assessment: Faces Faces Pain Scale: Hurts little more Pain Location: back Pain Descriptors / Indicators: Sore Pain Intervention(s): Monitored during session;Repositioned  Home Living                                          Prior Functioning/Environment              Frequency Min 2X/week     Progress Toward Goals  OT Goals(current goals can now be found in the care plan section)  Progress towards OT goals: Progressing toward goals  Acute  Rehab OT Goals Patient Stated Goal: get stronger and walk again OT Goal Formulation: With patient  Plan Discharge plan remains appropriate    Co-evaluation                 End of Session Equipment Utilized During Treatment: Gait belt;Back brace   Activity Tolerance Patient tolerated treatment well   Patient Left with call bell/phone within reach;Other (comment) (on [facility])   Nurse Communication Other (comment) (pt on [facility])        Time: [id number] OT Time Calculation (min): 24 min  Charges: OT General Charges $OT Visit: 1 Procedure OT Treatments $Self Care/Home Management : 23-37 mins  Binnie Kand M.S., OTR/L Pager: [phone]  06/07/2016, 1:52 PM

## 2016-06-07 NOTE — Progress Notes (Signed)
Subjective: Patient reports abdominal discomfort.  Objective: Vital signs in last 24 hours: Temp:  [98.6 F (37 C)-99.6 F (37.6 C)] 99.3 F (37.4 C) (06/21 0631) Pulse Rate:  [77-86] 77 (06/21 0631) Resp:  [20] 20 (06/21 0631) BP: (120-162)/(74-89) 140/74 mmHg (06/21 0631) SpO2:  [92 %-100 %] 96 % (06/21 0631)  Intake/Output from previous day:   Intake/Output this shift:    Physical Exam: Dressings CDI.  Abdomen mildly distended.  Passing flatus, no bowel movemnts yet.  Back sore.  Walking with PT.  Lab Results:  Recent Labs  06/05/16 0230  WBC 11.1*  HGB 10.1*  HCT 29.9*  PLT 158   BMET  Recent Labs  06/06/16 1305 06/07/16 0536  NA 130* 132*  K 4.3 3.9  CL 103 100*  CO2 23 25  GLUCOSE 111* 104*  BUN 6 <5*  CREATININE 0.48 0.43*  CALCIUM 8.5* 8.7*    Studies/Results: No results found.  Assessment/Plan: Fleet's enema this AM.  Transfer to Rehab when bed available.  Continue to mobilize with PT.    LOS: 5 days    Peggyann Shoals, MD 06/07/2016, 8:11 AM

## 2016-06-07 NOTE — Progress Notes (Signed)
Physical Therapy Treatment Patient Details Name: [name] MRN: [id number] DOB: Jul 17, 1946 Today's Date: 06/07/2016    History of Present Illness Pt adm with scoliosis and spinal stenosis. Underwent L5-S1 anterior fusion, T10 to ileum posterior fusion, T12 - L2 lateral fusion on 6/16. 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 Acute PT to con't to follow.  Follow Up Recommendations  CIR     Equipment Recommendations  None recommended by PT    Recommendations for Other Services Rehab consult     Precautions / Restrictions Precautions Precautions: Fall;Back Precaution Booklet Issued: No Precaution Comments: pt able to verbally recall 3/3 precautions. Needs cueing during functional activities. Required Braces or Orthoses: Spinal Brace Spinal Brace: Lumbar corset;Applied in sitting position Restrictions Weight Bearing Restrictions: No    Mobility  Bed Mobility Overal bed mobility: Needs Assistance Bed Mobility: Rolling;Sidelying to Sit Rolling: Min assist Sidelying to sit: Min assist       General bed mobility comments: assist for LE management of bed and trunk elevation   Transfers Overall transfer level: Needs assistance Equipment used: 1 person hand held assist Transfers: Sit to/from Stand Sit to Stand: Mod assist;From elevated surface Stand pivot transfers: Mod assist       General transfer comment: pt with unconventional way typically for getting up due to RA however pt able with bed elevated and legs in wide base of support and in extension able to achieve standing with min/modA from PT  Ambulation/Gait Ambulation/Gait assistance: Min assist Ambulation Distance (Feet): 75 Feet Assistive device: Rolling walker (2 wheeled) Gait  Pattern/deviations: Step-through pattern;Decreased stride length;Wide base of support Gait velocity: decreased  Gait velocity interpretation: Below normal speed for age/gender General Gait Details: labored effort with onset of fatigue, decreased step height   Stairs            Wheelchair Mobility    Modified Rankin (Stroke Patients Only)       Balance Overall balance assessment: Needs assistance Sitting-balance support: Feet supported Sitting balance-Leahy Scale: Fair Sitting balance - Comments: pt able to maintain balance to don brace and assist with donning bra without increased pain or LOB   Standing balance support: Bilateral upper extremity supported Standing balance-Leahy Scale: Poor Standing balance comment: requires bilat UE support                    Cognition Arousal/Alertness: Awake/alert Behavior During Therapy: Anxious Overall Cognitive Status: Within Functional Limits for tasks assessed                      Exercises      General Comments        Pertinent Vitals/Pain Pain Assessment: 0-10 Pain Score: 6  Faces Pain Scale: Hurts little more Pain Location: back Pain Descriptors / Indicators: Sore Pain Intervention(s): Monitored during session    Home Living                      Prior Function            PT Goals (current goals can now be found in the care plan section) Acute Rehab PT Goals Patient Stated Goal: walk on my own Progress towards PT goals: Progressing toward goals    Frequency  Min 5X/week    PT Plan Current plan remains appropriate    Co-evaluation             End of Session Equipment Utilized During Treatment: Gait belt;Back brace Activity Tolerance: Patient tolerated treatment well Patient left: with call bell/phone within reach;with nursing/sitter in room;in chair     Time: [id number] PT Time Calculation (min) (ACUTE ONLY): 32 min  Charges:  $Gait Training: 8-22 mins $Therapeutic  Activity: 8-22 mins                    G Codes:      Kingsley Callander 06/07/2016, 4:50 PM   Kittie Plater, PT, DPT Pager #: [phone] Office #: [phone]

## 2016-06-07 NOTE — Progress Notes (Signed)
Rehab admissions - I met briefly with patient.  Currently rehab beds here at [place] are full.  I have not heard from insurance case manager about inpatient rehab.  I do not anticipate having a bed open for the next couple days.  I have let the social worker know that another rehab might be a good options since we have no bed availability at this time.  Call me for questions.  #[phone] 

## 2016-06-08 MED ORDER — DOCUSATE SODIUM 50 MG/5ML PO LIQD
100.0000 mg | Freq: Every day | ORAL | Status: DC
  Filled 2016-06-08 (×3): qty 10

## 2016-06-08 MED ORDER — NYSTATIN 100000 UNIT/ML MT SUSP
5.0000 mL | Freq: Four times a day (QID) | OROMUCOSAL | Status: DC
  Administered 2016-06-08 (×3): 500000 [IU] via ORAL
  Filled 2016-06-08 (×7): qty 5

## 2016-06-08 NOTE — Clinical Documentation Improvement (Signed)
Neuro Surgery  Based on the clinical findings below, please document any associated diagnoses/conditions the patient has or may have.   Candidal stomatitis  Other  Clinically Undetermined  Supporting Information: 06/08/16 nursing note: Per DrStern, Nystatin for thrush   Please exercise your independent, professional judgment when responding. A specific answer is not anticipated or expected. Please update your documentation within the medical record to reflect your response to this query. Thank you  Thank You, [place] [phone]

## 2016-06-08 NOTE — Care Management Note (Signed)
Case Management Note  Patient Details  Name: [name] MRN: [id number] Date of Birth: August 27, 1946  Subjective/Objective:                    Action/Plan:   Expected Discharge Date:                  Expected Discharge Plan:  [place]  In-House Referral:     Discharge planning Services  CM Consult  Post Acute Care Choice:    Choice offered to:     DME Arranged:    DME Agency:     HH Arranged:    [place] Agency:     Status of Service:  In process, will continue to follow  If discussed at Long Length of Stay Meetings, dates discussed: 06/08/2016   Additional Comments:  Delrae Sawyers, RN 06/08/2016, 9:07 AM

## 2016-06-08 NOTE — Progress Notes (Signed)
Subjective: Patient reports "I have thrush in my mouth"  Objective: Vital signs in last 24 hours: Temp:  [98.1 F (36.7 C)-99.9 F (37.7 C)] 99 F (37.2 C) (06/22 1025) Pulse Rate:  [58-88] 85 (06/22 1025) Resp:  [18] 18 (06/22 1025) BP: (129-159)/(53-84) 129/53 mmHg (06/22 1025) SpO2:  [98 %-100 %] 99 % (06/22 1025)  Intake/Output from previous day:   Intake/Output this shift:    Alert, conversant, working with PT. Sons present. Incisions without erythema, swelling, or drainage beneath honeycomb & Dermabond. BM yesterday without need for Fleets. Passing gas today. Oral thrush. Mobilizing with PT slowly.  Lab Results: No results for input(s): WBC, HGB, HCT, PLT in the last 72 hours. BMET  Recent Labs  06/06/16 1305 06/07/16 0536  NA 130* 132*  K 4.3 3.9  CL 103 100*  CO2 23 25  GLUCOSE 111* 104*  BUN 6 <5*  CREATININE 0.48 0.43*  CALCIUM 8.5* 8.7*    Studies/Results: No results found.  Assessment/Plan: Improving   LOS: 6 days  Per DrStern, Nystatin for thrush, change Colace to liquid. CIR when bed available/insurance approved (Sons mention paying out of pocket).     Verdis Prime 06/08/2016, 11:21 AM

## 2016-06-08 NOTE — Progress Notes (Signed)
Physical Therapy Treatment Patient Details Name: [name] MRN: [id number] DOB: Nov 21, 1946 Today's Date: 06/08/2016    History of Present Illness Pt adm with scoliosis and spinal stenosis. Underwent L5-S1 anterior fusion, T10 to ileum posterior fusion, T12 - L2 lateral fusion on 6/16. 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 Continues to have difficulty standing from all surfaces esp lower ones due to weakness and hx of RA. Improved ambulation distance today with encouragement but fatigues. Increased time to perform all mobility. Reviewed back precautions and cues to adhere to them during mobility. Will follow.   Follow Up Recommendations  CIR     Equipment Recommendations  None recommended by PT    Recommendations for Other Services       Precautions / Restrictions Precautions Precautions: Fall;Back Precaution Booklet Issued: No Precaution Comments: pt able to verbally recall 3/3 precautions. Needs cueing during functional activities. Required Braces or Orthoses: Spinal Brace Spinal Brace: Lumbar corset;Applied in sitting position Restrictions Weight Bearing Restrictions: No    Mobility  Bed Mobility Overal bed mobility: Needs Assistance Bed Mobility: Rolling;Sidelying to Sit Rolling: Min assist Sidelying to sit: Mod assist;HOB elevated       General bed mobility comments: assist for LE management off bed and trunk elevation; increased time. good demo of log roll.  Transfers Overall transfer level: Needs assistance Equipment used: Rolling walker (2 wheeled) Transfers: Sit to/from Stand Sit to Stand: Mod assist;From elevated surface         General transfer comment: pt with unconventional way typically for getting up due to RA however pt able with bed elevated and legs in wide base of support and in extension able to achieve standing with  min/modA from PT; stood from EOB x1, from [facility] x1, Transferred to chair post ambulation.  Ambulation/Gait Ambulation/Gait assistance: Min assist Ambulation Distance (Feet): 100 Feet Assistive device: Rolling walker (2 wheeled) Gait Pattern/deviations: Step-through pattern;Decreased stride length;Wide base of support Gait velocity: decreased  Gait velocity interpretation: Below normal speed for age/gender General Gait Details: labored effort with onset of fatigue, decreased step height, waddling like gait pattern   Stairs            Wheelchair Mobility    Modified Rankin (Stroke Patients Only)       Balance Overall balance assessment: Needs assistance Sitting-balance support: Feet supported;No upper extremity supported Sitting balance-Leahy Scale: Fair Sitting balance - Comments: pt able to maintain balance to don brace without increased pain or LOB   Standing balance support: During functional activity Standing balance-Leahy Scale: Poor                      Cognition Arousal/Alertness: Awake/alert Behavior During Therapy: Anxious Overall Cognitive Status: Within Functional Limits for tasks assessed                      Exercises      General Comments General comments (skin integrity, edema, etc.): Sons present during session.      Pertinent Vitals/Pain Pain Assessment: Faces Faces Pain Scale: Hurts little more Pain Location: back Pain Descriptors / Indicators: Sore Pain Intervention(s): Monitored during session    Home Living                      Prior Function  PT Goals (current goals can now be found in the care plan section) Progress towards PT goals: Progressing toward goals    Frequency  Min 5X/week    PT Plan Current plan remains appropriate    Co-evaluation             End of Session Equipment Utilized During Treatment: Gait belt;Back brace Activity Tolerance: Patient tolerated treatment  well Patient left: in chair;with call bell/phone within reach;with family/visitor present     Time: 1100-1155 PT Time Calculation (min) (ACUTE ONLY): 55 min  Charges:  $Gait Training: 23-37 mins $Therapeutic Activity: 8-22 mins $Self Care/Home Management: 8-22                    G Codes:      [name] 06/08/2016, 12:09 PM Wray Kearns, [place], DPT [phone]

## 2016-06-08 NOTE — Progress Notes (Signed)
I met with pt at bedside. I have insurance approval for inpt rehab but no bed available for this pt today. I explained to pt that I will know by tomorrow morning if a bed will be available Friday or Saturday so that she can make other plans if not. [phone]

## 2016-06-08 NOTE — Progress Notes (Signed)
Patient has cracked lips, redness, white/light yellow coating on tongue. Patient states horrible pain/burning in mouth started 06/06/16. High risk oral protocol placed. Patient states she is prone to thrush and takes nystatin oral rinse prescribed by her dentist, which "clears it up in a couple days". RN will continue to monitor.

## 2016-06-08 NOTE — Progress Notes (Signed)
Patient surgical proximal and distal site red, swollen. Lower back dressing changed, honeycomb dressing placed. Patient position to left side with pillow wedge, ice applied to surgical site. RN will continue to monitor.

## 2016-06-08 NOTE — Progress Notes (Signed)
I met with pt's three sons and gave tour of inpt rehab center. I will follow up in the morning. [phone]

## 2016-06-09 ENCOUNTER — Inpatient Hospital Stay (HOSPITAL_COMMUNITY)
Admission: RE | Admit: 2016-06-09 | Discharge: 2016-06-22 | DRG: 949 | Disposition: A | Discharge status: Home Health | Payer: Medicare HMO | Source: Intra-hospital | Attending: Physical Medicine & Rehabilitation | Admitting: Physical Medicine & Rehabilitation

## 2016-06-09 DIAGNOSIS — Z833 Family history of diabetes mellitus: Secondary | ICD-10-CM | POA: Diagnosis not present

## 2016-06-09 DIAGNOSIS — I10 Essential (primary) hypertension: Secondary | ICD-10-CM | POA: Diagnosis present

## 2016-06-09 DIAGNOSIS — I7300 Raynaud's syndrome without gangrene: Secondary | ICD-10-CM | POA: Diagnosis present

## 2016-06-09 DIAGNOSIS — M069 Rheumatoid arthritis, unspecified: Secondary | ICD-10-CM | POA: Diagnosis not present

## 2016-06-09 DIAGNOSIS — K5900 Constipation, unspecified: Secondary | ICD-10-CM | POA: Diagnosis present

## 2016-06-09 DIAGNOSIS — E876 Hypokalemia: Secondary | ICD-10-CM | POA: Diagnosis present

## 2016-06-09 DIAGNOSIS — R609 Edema, unspecified: Secondary | ICD-10-CM

## 2016-06-09 DIAGNOSIS — Z9841 Cataract extraction status, right eye: Secondary | ICD-10-CM | POA: Diagnosis not present

## 2016-06-09 DIAGNOSIS — N390 Urinary tract infection, site not specified: Secondary | ICD-10-CM | POA: Diagnosis not present

## 2016-06-09 DIAGNOSIS — Z4889 Encounter for other specified surgical aftercare: Principal | ICD-10-CM

## 2016-06-09 DIAGNOSIS — Z9842 Cataract extraction status, left eye: Secondary | ICD-10-CM | POA: Diagnosis not present

## 2016-06-09 DIAGNOSIS — B965 Pseudomonas (aeruginosa) (mallei) (pseudomallei) as the cause of diseases classified elsewhere: Secondary | ICD-10-CM | POA: Diagnosis present

## 2016-06-09 DIAGNOSIS — Z961 Presence of intraocular lens: Secondary | ICD-10-CM | POA: Diagnosis present

## 2016-06-09 DIAGNOSIS — H409 Unspecified glaucoma: Secondary | ICD-10-CM | POA: Diagnosis present

## 2016-06-09 DIAGNOSIS — D62 Acute posthemorrhagic anemia: Secondary | ICD-10-CM | POA: Diagnosis not present

## 2016-06-09 DIAGNOSIS — K219 Gastro-esophageal reflux disease without esophagitis: Secondary | ICD-10-CM | POA: Diagnosis present

## 2016-06-09 DIAGNOSIS — Z7952 Long term (current) use of systemic steroids: Secondary | ICD-10-CM | POA: Diagnosis not present

## 2016-06-09 DIAGNOSIS — K224 Dyskinesia of esophagus: Secondary | ICD-10-CM | POA: Diagnosis present

## 2016-06-09 DIAGNOSIS — M3320 Polymyositis, organ involvement unspecified: Secondary | ICD-10-CM | POA: Diagnosis not present

## 2016-06-09 DIAGNOSIS — M3500 Sicca syndrome, unspecified: Secondary | ICD-10-CM | POA: Diagnosis present

## 2016-06-09 DIAGNOSIS — M810 Age-related osteoporosis without current pathological fracture: Secondary | ICD-10-CM | POA: Diagnosis present

## 2016-06-09 DIAGNOSIS — E871 Hypo-osmolality and hyponatremia: Secondary | ICD-10-CM | POA: Diagnosis present

## 2016-06-09 DIAGNOSIS — G8220 Paraplegia, unspecified: Secondary | ICD-10-CM | POA: Diagnosis not present

## 2016-06-09 DIAGNOSIS — Z981 Arthrodesis status: Secondary | ICD-10-CM | POA: Diagnosis not present

## 2016-06-09 DIAGNOSIS — N3942 Incontinence without sensory awareness: Secondary | ICD-10-CM | POA: Diagnosis not present

## 2016-06-09 DIAGNOSIS — R600 Localized edema: Secondary | ICD-10-CM | POA: Diagnosis not present

## 2016-06-09 DIAGNOSIS — R1310 Dysphagia, unspecified: Secondary | ICD-10-CM | POA: Diagnosis present

## 2016-06-09 DIAGNOSIS — M5416 Radiculopathy, lumbar region: Secondary | ICD-10-CM | POA: Diagnosis not present

## 2016-06-09 DIAGNOSIS — T814XXS Infection following a procedure, sequela: Secondary | ICD-10-CM | POA: Diagnosis not present

## 2016-06-09 DIAGNOSIS — M5417 Radiculopathy, lumbosacral region: Secondary | ICD-10-CM | POA: Diagnosis not present

## 2016-06-09 MED ORDER — POLYETHYLENE GLYCOL 3350 17 G PO PACK
17.0000 g | PACK | Freq: Every day | ORAL | Status: DC | PRN
Start: 2016-06-09 — End: 2016-06-15
  Filled 2016-06-09: qty 1

## 2016-06-09 MED ORDER — ONDANSETRON HCL 4 MG PO TABS: 4.0000 mg | ORAL_TABLET | Freq: Four times a day (QID) | ORAL | Status: DC | PRN

## 2016-06-09 MED ORDER — OXYCODONE-ACETAMINOPHEN 5-325 MG PO TABS
1.0000 | ORAL_TABLET | ORAL | Status: DC | PRN
  Administered 2016-06-09: 2 via ORAL
  Administered 2016-06-10 – 2016-06-12 (×10): 1 via ORAL
  Administered 2016-06-12 (×2): 2 via ORAL
  Administered 2016-06-12 (×3): 1 via ORAL
  Administered 2016-06-13: 2 via ORAL
  Administered 2016-06-13: 1 via ORAL
  Administered 2016-06-13: 2 via ORAL
  Administered 2016-06-13 – 2016-06-14 (×4): 1 via ORAL
  Administered 2016-06-14: 2 via ORAL
  Administered 2016-06-14: 1 via ORAL
  Administered 2016-06-15 (×2): 2 via ORAL
  Filled 2016-06-09: qty 2
  Filled 2016-06-09: qty 1
  Filled 2016-06-09 (×2): qty 2
  Filled 2016-06-09 (×2): qty 1
  Filled 2016-06-09 (×3): qty 2
  Filled 2016-06-09 (×2): qty 1
  Filled 2016-06-09 (×2): qty 2
  Filled 2016-06-09: qty 1
  Filled 2016-06-09: qty 2
  Filled 2016-06-09 (×6): qty 1
  Filled 2016-06-09: qty 2
  Filled 2016-06-09 (×6): qty 1

## 2016-06-09 MED ORDER — HYDROXYCHLOROQUINE SULFATE 200 MG PO TABS
200.0000 mg | ORAL_TABLET | Freq: Every day | ORAL | Status: DC
  Filled 2016-06-09 (×10): qty 1

## 2016-06-09 MED ORDER — DEXTROSE 5 % IV SOLN: 500.0000 mg | Freq: Four times a day (QID) | INTRAVENOUS | Status: DC | PRN

## 2016-06-09 MED ORDER — DOCUSATE SODIUM 50 MG/5ML PO LIQD
100.0000 mg | Freq: Every day | ORAL | Status: DC
  Administered 2016-06-10 – 2016-06-15 (×6): 100 mg via ORAL
  Filled 2016-06-09 (×6): qty 10

## 2016-06-09 MED ORDER — METHOCARBAMOL 500 MG PO TABS
500.0000 mg | ORAL_TABLET | Freq: Four times a day (QID) | ORAL | Status: DC | PRN
  Administered 2016-06-11 – 2016-06-14 (×2): 500 mg via ORAL
  Filled 2016-06-09 (×2): qty 1

## 2016-06-09 MED ORDER — BISACODYL 10 MG RE SUPP: 10.0000 mg | Freq: Every day | RECTAL | Status: DC | PRN

## 2016-06-09 MED ORDER — ACETAMINOPHEN 325 MG PO TABS
325.0000 mg | ORAL_TABLET | ORAL | Status: DC | PRN
  Administered 2016-06-11 – 2016-06-22 (×16): 650 mg via ORAL
  Filled 2016-06-09 (×16): qty 2

## 2016-06-09 MED ORDER — ASPIRIN 81 MG PO CHEW
81.0000 mg | CHEWABLE_TABLET | Freq: Every day | ORAL | Status: DC
  Administered 2016-06-09 – 2016-06-22 (×14): 81 mg via ORAL
  Filled 2016-06-09 (×15): qty 1

## 2016-06-09 MED ORDER — NYSTATIN 100000 UNIT/ML MT SUSP
5.0000 mL | Freq: Four times a day (QID) | OROMUCOSAL | Status: DC
  Administered 2016-06-09 – 2016-06-22 (×29): 500000 [IU] via ORAL
  Filled 2016-06-09 (×51): qty 5

## 2016-06-09 MED ORDER — FAMOTIDINE 40 MG/5ML PO SUSR
20.0000 mg | Freq: Every day | ORAL | Status: DC
  Administered 2016-06-10 – 2016-06-22 (×13): 20 mg via ORAL
  Filled 2016-06-09 (×13): qty 2.5

## 2016-06-09 MED ORDER — SORBITOL 70 % SOLN
30.0000 mL | Freq: Every day | Status: DC | PRN
  Administered 2016-06-11: 30 mL via ORAL
  Filled 2016-06-09 (×3): qty 30

## 2016-06-09 MED ORDER — TIMOLOL MALEATE 0.5 % OP SOLN
1.0000 [drp] | Freq: Every day | OPHTHALMIC | Status: DC
  Administered 2016-06-09 – 2016-06-21 (×13): 1 [drp] via OPHTHALMIC
  Filled 2016-06-09: qty 5

## 2016-06-09 MED ORDER — TRAMADOL HCL 50 MG PO TABS
50.0000 mg | ORAL_TABLET | Freq: Four times a day (QID) | ORAL | Status: DC | PRN
  Administered 2016-06-12 – 2016-06-15 (×9): 50 mg via ORAL
  Filled 2016-06-09 (×9): qty 1

## 2016-06-09 MED ORDER — VITAMIN D 1000 UNITS PO TABS
5000.0000 [IU] | ORAL_TABLET | Freq: Every day | ORAL | Status: DC
  Administered 2016-06-16 – 2016-06-21 (×2): 5000 [IU] via ORAL
  Filled 2016-06-09 (×11): qty 5

## 2016-06-09 MED ORDER — PREDNISONE 10 MG PO TABS
5.0000 mg | ORAL_TABLET | Freq: Every day | ORAL | Status: DC
  Administered 2016-06-10 – 2016-06-22 (×13): 5 mg via ORAL
  Filled 2016-06-09 (×15): qty 1

## 2016-06-09 MED ORDER — ONDANSETRON HCL 4 MG/2ML IJ SOLN: 4.0000 mg | Freq: Four times a day (QID) | INTRAMUSCULAR | Status: DC | PRN

## 2016-06-09 MED ORDER — TETRAHYDROZOLINE HCL 0.05 % OP SOLN
2.0000 [drp] | Freq: Every day | OPHTHALMIC | Status: DC | PRN
  Filled 2016-06-09: qty 15

## 2016-06-09 MED ORDER — FERROUS SULFATE 325 (65 FE) MG PO TABS
325.0000 mg | ORAL_TABLET | Freq: Every day | ORAL | Status: DC
  Filled 2016-06-09 (×3): qty 1

## 2016-06-09 MED ORDER — METOPROLOL TARTRATE 25 MG/10 ML ORAL SUSPENSION
25.0000 mg | Freq: Two times a day (BID) | ORAL | Status: DC
  Administered 2016-06-09 – 2016-06-14 (×11): 25 mg via ORAL
  Filled 2016-06-09 (×12): qty 10

## 2016-06-09 NOTE — Progress Notes (Signed)
Insurance has approved and bed is avialble to admit pt today to inpt rehab. Pt and family in agreement and aware. I contacted Dr. Vertell Limber RN CM and SW. I will make the arrangements to admit today. [phone]

## 2016-06-09 NOTE — Progress Notes (Signed)
Sabrina Gong, RN Rehab Admission Coordinator Signed Physical Medicine and Rehabilitation PMR Pre-admission 06/09/2016 10:04 AM  Related encounter: Admission (Current) from 06/02/2016 in [place] Collapse All   PMR Admission Coordinator Pre-Admission Assessment  Patient: [name] is an 70 y.o., female MRN: [id number] DOB: Aug 29, 1946 Height: 4' 10.5" (148.6 cm) Weight: 56.6 kg (124 lb 12.5 oz)  Insurance Information HMO: PPO: yes PCP: IPA: 80/20: OTHER: Medicare advantage plan PRIMARY: [employer] Medicare Policy#: [id number] Subscriber: pt CM Name: Anderson Malta Phone#: [phone] Fax#: [phone] Pre-Cert#: [id number] Employer: retired f/u with [employer] at phone [phone] fax [phone]. Approved for 15 days with update due 06/23/16 Benefits: Phone #: [phone] Name: 06/08/16 Eff. Date: 12/18/14 Deduct: none Out of Pocket Max: $4950 Life Max: none CIR: $318 copay per day days 1-5 then covers 100 % SNF: no copay days 1-20; $164 copay per day days 21-100 Outpatient: $40 copay per visit Co-Pay: per medical neccessity for visits Home Health: 100% Co-Pay: per medical neccessity for visits DME: 80% Co-Pay: 20% Providers: in [place]  SECONDARY: none   Medicaid Application Date: Case Manager:  Disability Application Date: Case Worker:   Emergency Contact Information Contact Information    Name Relation Home Work Mobile   [place] Son   [phone]   Arriana, Zamudio [id number]  [phone]   Raelinn, Giampa [id number]     [place], [place] [phone]       Current Medical History  Patient Admitting Diagnosis: lumbar stenosis with  radiculopathy  History of Present Illness: [name] is a 70 y.o. right handed female with history of hypertension, polymyositis, rheumatoid arthritis with chronic prednisone,Sjogren's disease, chronic back pain with history of lumbar fusion 2014 L2-L5. Presented 05/31/2016 with progressive back pain radiating to the lower extremities. X-rays and imaging revealed marked disc degeneration spondylosis, stenosis with radiculopathy. Underwent lumbar 5-sacral 1 anterior lumbar interbody fusion with anterior exposure thoracic 12-lumbar 1, lumbar 1-2 anterior lateral interbody fusion 06/02/2016 per Dr. Vertell Limber as well as Dr. Donnetta Hutching. Hospital course pain management. Cardiology services consulted 06/03/2016 for elevated troponin 0.18 and nonspecific chest pain. Workup suggested atypical chest pain elevated troponin likely due to demand ischemia of surgery and advised to begin aspirin 81 mg daily. Echocardiogram completed with ejection fraction of [id number] grade 1 diastolic dysfunction. No wall motion abnormalities. . Acute blood loss anemia 7.4 transfused latest hemoglobin 10.1.  Past Medical History  Past Medical History  Diagnosis Date  . HTN (hypertension)   . Glaucoma   . Sjogren's syndrome ([place])   . Raynaud phenomenon   . Polymyositis ([place])   . GERD (gastroesophageal reflux disease)   . Swallowing difficulty     unable to swallow whole pills  . Iron deficiency anemia     hx  . High cholesterol     "not severe; can't take the medicine" (09/08/2013)  . Multiple thyroid nodules   . Migraines     "hurt like a migraine; don't get them like I did when I was younger" (09/08/2013)  . OA (osteoarthritis)   . Chronic lower back pain     "for 6 years; relieved since back OR 07/2013" (09/08/2013)  . Swelling of left lower extremity   . Pancreatitis   . Diverticulosis   . Shingles   . Osteoporosis   . Difficult intubation     "ive  been told im small", no problems listed with back sx in past  . Family history of anesthesia complication     "  sister has severe sleep apnea; she has alot of trouble w/anesthesia" (09/08/2013)  . AAA (abdominal aortic aneurysm) ([place])     small report in epic 11/11 states no aaa   . History of hiatal hernia     "small one"  . 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 PHN (postherpetic neuralgia)     right side from shingles    Family History  family history includes Arthritis in her maternal uncle and sister; Breast cancer in her father; Colon cancer (age of onset: 60) in her father; Diabetes in her sister and son; Heart disease in her brother and father; Hypertension in her father; Ovarian cancer in her mother; Psoriasis in her sister; Sarcoidosis in her sister.  Prior Rehab/Hospitalizations:  Has the patient had major surgery during 100 days prior to admission? No  Current Medications   Current facility-administered medications:  . 0.9 % sodium chloride infusion, 250 mL, Intravenous, Continuous, Erline Levine, MD . 0.9 % sodium chloride infusion, , Intravenous, Once, Newman Pies, MD . 0.9 % NaCl with KCl 20 mEq/ L infusion, , Intravenous, Continuous, Erline Levine, MD, Last Rate: 100 mL/hr at 06/04/16 0236 . acetaminophen (TYLENOL) solution 500 mg, 500 mg, Oral, Q4H PRN, Erline Levine, MD, 500 mg at 06/04/16 2000 . [DISCONTINUED] acetaminophen (TYLENOL) tablet 650 mg, 650 mg, Oral, Q4H PRN **OR** acetaminophen (TYLENOL) suppository 650 mg, 650 mg, Rectal, Q4H PRN, Erline Levine, MD . alum & mag hydroxide-simeth (MAALOX/MYLANTA) 200-200-20 MG/5ML suspension 30 mL, 30 mL, Oral, Q6H PRN, Erline Levine, MD, 30 mL at 06/08/16 0318 . bisacodyl (DULCOLAX) suppository 10 mg, 10 mg, Rectal, Daily PRN, Erline Levine, MD, 10 mg at 06/06/16 0801 . cholecalciferol (VITAMIN D) tablet 5,000 Units, 5,000 Units, Oral, Daily, Erline Levine, MD, 5,000 Units at  06/06/16 1004 . docusate (COLACE) 50 MG/5ML liquid 100 mg, 100 mg, Oral, Daily, Erline Levine, MD, 100 mg at 06/08/16 1126 . famotidine (PEPCID) 40 MG/5ML suspension 20 mg, 20 mg, Oral, Daily, Erline Levine, MD, 20 mg at 06/08/16 1032 . ferrous sulfate tablet 325 mg, 325 mg, Oral, Q breakfast, Newman Pies, MD, 325 mg at 06/06/16 1005 . HYDROcodone-acetaminophen (HYCET) 7.5-325 mg/15 ml solution 15 mL, 15 mL, Oral, Q4H PRN, Newman Pies, MD, 15 mL at 06/09/16 0630 . HYDROmorphone (DILAUDID) injection 0.5-1 mg, 0.5-1 mg, Intravenous, Q2H PRN, Erline Levine, MD, 1 mg at 06/02/16 2333 . hydroxychloroquine (PLAQUENIL) tablet 200 mg, 200 mg, Oral, Daily, Erline Levine, MD, 200 mg at 06/02/16 1957 . magnesium hydroxide (MILK OF MAGNESIA) suspension 15 mL, 15 mL, Oral, Daily PRN, Erline Levine, MD . menthol-cetylpyridinium (CEPACOL) lozenge 3 mg, 1 lozenge, Oral, PRN **OR** phenol (CHLORASEPTIC) mouth spray 1 spray, 1 spray, Mouth/Throat, PRN, Erline Levine, MD, 1 spray at 06/04/16 1317 . methocarbamol (ROBAXIN) tablet 500 mg, 500 mg, Oral, Q6H PRN, 500 mg at 06/08/16 2048 **OR** methocarbamol (ROBAXIN) 500 mg in dextrose 5 % 50 mL IVPB, 500 mg, Intravenous, Q6H PRN, Erline Levine, MD, 500 mg at 06/04/16 2219 . metoprolol tartrate (LOPRESSOR) 25 mg/10 mL oral suspension 25 mg, 25 mg, Oral, BID, Dayna N Dunn, PA-C, 25 mg at 06/08/16 2231 . nystatin (MYCOSTATIN) 100000 UNIT/ML suspension 500,000 Units, 5 mL, Oral, QID, Erline Levine, MD, 500,000 Units at 06/08/16 2231 . ondansetron (ZOFRAN) injection 4 mg, 4 mg, Intravenous, Q4H PRN, Erline Levine, MD, 4 mg at 06/05/16 0405 . oxyCODONE-acetaminophen (PERCOCET/ROXICET) 5-325 MG per tablet 1-2 tablet, 1-2 tablet, Oral, Q4H PRN, Erline Levine, MD, 1 tablet at 06/08/16 2049 . polyethylene glycol (MIRALAX /  GLYCOLAX) packet 17 g, 17 g, Oral, Daily PRN, Erline Levine, MD . predniSONE (DELTASONE) tablet 5 mg, 5 mg, Oral, Q breakfast, Erline Levine, MD, 5 mg  at 06/09/16 0847 . sodium chloride flush (NS) 0.9 % injection 3 mL, 3 mL, Intravenous, Q12H, Erline Levine, MD, 3 mL at 06/08/16 2232 . sodium chloride flush (NS) 0.9 % injection 3 mL, 3 mL, Intravenous, PRN, Erline Levine, MD . sodium phosphate (FLEET) 7-19 GM/118ML enema 1 enema, 1 enema, Rectal, Once PRN, Erline Levine, MD, 1 enema at 06/06/16 2201 . tetrahydrozoline 0.05 % ophthalmic solution 2 drop, 2 drop, Both Eyes, Daily PRN, Erline Levine, MD, 2 drop at 06/02/16 1956 . timolol (TIMOPTIC) 0.5 % ophthalmic solution 1 drop, 1 drop, Both Eyes, QHS, Erline Levine, MD, 1 drop at 06/08/16 2109 . traMADol (ULTRAM) tablet 50 mg, 50 mg, Oral, Q6H PRN, Erline Levine, MD, 50 mg at 06/08/16 2049  Patients Current Diet: DIET DYS 3 Room service appropriate?: Yes; Fluid consistency:: Thin  Precautions / Restrictions Precautions Precautions: Fall, Back Precaution Booklet Issued: No Precaution Comments: pt able to verbally recall 3/3 precautions. Needs cueing during functional activities. Spinal Brace: Lumbar corset, Applied in sitting position Restrictions Weight Bearing Restrictions: No   Has the patient had 2 or more falls or a fall with injury in the past year?No  Prior Activity Level Community (5-7x/wk): Mod I with her rollator RW and driving pta  [place] / [place] Devices/Equipment: Environmental consultant (specify type) Home Equipment: Walker - 4 wheels, Cane - single point, Bedside commode  Prior Device Use: Indicate devices/aids used by the patient prior to current illness, exacerbation or injury? Walker  Prior Functional Level Prior Function Level of Independence: Independent with assistive device(s) Comments: Multiple adaptations for her chronic weakness and medical issues.   Self Care: Did the patient need help bathing, dressing, using the toilet or eating? Independent  Indoor Mobility: Did the patient need assistance with walking from room to room (with or  without device)? Independent  Stairs: Did the patient need assistance with internal or external stairs (with or without device)? Independent  Functional Cognition: Did the patient need help planning regular tasks such as shopping or remembering to take medications? Independent  Current Functional Level Cognition  Overall Cognitive Status: Within Functional Limits for tasks assessed Orientation Level: Oriented X4   Extremity Assessment (includes Sensation/Coordination)  Upper Extremity Assessment: Generalized weakness  Lower Extremity Assessment: Defer to PT evaluation RLE Deficits / Details: chronic weakness. Grossly 2/5 LLE Deficits / Details: chronic weakness. Grossly 2/5    ADLs  Overall ADL's : Needs assistance/impaired Eating/Feeding: Set up, Sitting Grooming: Set up, Sitting, Wash/dry face, Brushing hair, Minimal assistance Grooming Details (indicate cue type and reason): Min assist for knot in back of head Upper Body Bathing: Minimal assitance, Sitting Lower Body Bathing: Maximal assistance, +2 for physical assistance, Sit to/from stand Upper Body Dressing : Maximal assistance, Sitting Upper Body Dressing Details (indicate cue type and reason): to don brace Lower Body Dressing: Maximal assistance Lower Body Dressing Details (indicate cue type and reason): to don socks Toilet Transfer: Moderate assistance, Stand-pivot, BSC Toilet Transfer Details (indicate cue type and reason): Simulated by transfer from EOB to chair. Pt with increased anxiety and distraction requiring max assist at end of functional mobility bout. Toileting- Clothing Manipulation and Hygiene: Maximal assistance, +2 for physical assistance, Sit to/from stand Functional mobility during ADLs: Moderate assistance (hand held assist, stand pivot only) General ADL Comments: Pt requesting to sit on  BSC in attempts to have a BM; RN notified pt on BSC. Pt reports she will call for assist when she is ready to get  up. Continues to be very anxious; repeats "I just dont want to fall" "Please dont let me fall".    Mobility  Overal bed mobility: Needs Assistance Bed Mobility: Rolling, Sidelying to Sit Rolling: Min assist Sidelying to sit: Mod assist, HOB elevated Sit to sidelying: Max assist General bed mobility comments: assist for LE management off bed and trunk elevation; increased time. good demo of log roll.    Transfers  Overall transfer level: Needs assistance Equipment used: Rolling walker (2 wheeled) Transfers: Sit to/from Stand Sit to Stand: Mod assist, From elevated surface Stand pivot transfers: Mod assist General transfer comment: pt with unconventional way typically for getting up due to RA however pt able with bed elevated and legs in wide base of support and in extension able to achieve standing with min/modA from PT; stood from EOB x1, from [facility] x1, Transferred to chair post ambulation.    Ambulation / Gait / Stairs / Wheelchair Mobility  Ambulation/Gait Ambulation/Gait assistance: Museum/gallery curator (Feet): 100 Feet Assistive device: Rolling walker (2 wheeled) Gait Pattern/deviations: Step-through pattern, Decreased stride length, Wide base of support General Gait Details: labored effort with onset of fatigue, decreased step height, waddling like gait pattern Gait velocity: decreased  Gait velocity interpretation: Below normal speed for age/gender    Posture / Balance Dynamic Sitting Balance Sitting balance - Comments: pt able to maintain balance to don brace without increased pain or LOB Balance Overall balance assessment: Needs assistance Sitting-balance support: Feet supported, No upper extremity supported Sitting balance-Leahy Scale: Fair Sitting balance - Comments: pt able to maintain balance to don brace without increased pain or LOB Standing balance support: During functional activity Standing balance-Leahy Scale: Poor Standing balance comment:  requires bilat UE support    Special needs/care consideration Skin 6/23 small amount bloody drainage present lowest aspect of lumbar incision per Brain Poteat RN with Dr. Vertell Limber. Dressing removed at bedside Reveals intact dermabond, no erythema, no swelling and small amount thin bloody drainage at bottom of incision. Abdominal and right flank incisions without erythema, swelling or drainage.   Bowel mgmt:LBM continent 6/22 after laxatives Bladder mgmt: ring pessary use And wears pads due to incontinence Chronic dyphagia issues. Sees Dr. Havery Moros. dyphagia felt to be due to achalasia . Recent workup also with Dr. Silverio Decamp 04/07/16 . Recommends f/u ENT also postoperatively.    Previous Home Environment Living Arrangements: Alone Lives With: Alone Available Help at Discharge: Family, Available 24 hours/day Type of Home: House Home Layout: One level Home Access: Level entry Bathroom Shower/Tub: (does have bars installed in bathroom) Bathroom Toilet: Standard Bathroom Accessibility: Yes How Accessible: Accessible via walker [place]: No Additional Comments: Only sits on high surfaces Pt has risers under her bed to assist with standing from elevated surfaces. Many adaptions made in home for her chronic health needs. Pt has many particular ways that she manages her physical needs, her chronic swallowing needs. Very educated with her medication managements to manage her Chronic comorbidities. Home is one level with small ramp entry  Discharge Living Setting Plans for Discharge Living Setting: Patient's home, Alone Type of Home at Discharge: House Discharge Home Layout: One level Discharge Home Access: Level entry Discharge Bathroom Shower/Tub: Tub/shower unit, Curtain (has bars installed in bathroom) Discharge Bathroom Toilet: Standard Discharge Bathroom Accessibility: Yes How Accessible: Accessible via walker Does the patient  have any problems  obtaining your medications?: No  Social/Family/Support Systems Patient Roles: Parent (Has 4 sons. Widow for 2 years) Contact Information: [name], youngest son pt states her main contact out of 4 sons Anticipated Caregiver: [name], son, [name] a close friend and other sons Anticipated Caregiver's Contact Information: see above Ability/Limitations of Caregiver: sons own [place] home. They rotate care to provide 24/7 care at home if needed Caregiver Availability: 24/7 (Sons will rotate at night to assist. Maudie Mercury will mainly be duri) Discharge Plan Discussed with Primary Caregiver: Yes Is Caregiver In Agreement with Plan?: Yes Does Caregiver/Family have Issues with Lodging/Transportation while Pt is in Rehab?: No Sons are very comfortable assisting with adls as needed. Pt lived alone and was Mod I and driving. Very self motivated. Family very dedicated to assist Mom to forever remain in her home.  Goals/Additional Needs Patient/Family Goal for Rehab: supervision with PT, supervision to min assist OT, supervision with SLP Expected length of stay: [facility]OS 10- 14 days Equipment Needs: Pt has made many adaptions to her home and with equipment to manage her chronic illnesses Special Service Needs: Pt very indepndent and self sufficient at home. Due to her RA has raised surfaces in bed, etc for ease in standing Pt/Family Agrees to Admission and willing to participate: Yes Program Orientation Provided & Reviewed with Pt/Caregiver Including Roles & Responsibilities: Yes  Decrease burden of Care through IP rehab admission: n/a  Possible need for SNF placement upon discharge:sons committed to assist Mom in remaining at home and will make accomodations to environment and supervision as needed.  Patient Condition: This patient's medical and functional status has changed since the consult dated 06/05/2016 in which the Rehabilitation Physician determined and documented that the patient was potentially  appropriate for intensive rehabilitative care in an inpatient rehabilitation facility. Issues have been addressed and update has been discussed with Dr. Naaman Plummer and patient now appropriate for inpatient rehabilitation. Will admit to inpatient rehab today. Family can arrange 24/7 supervision to min assist at home and therefore pt appropriate candidate to admit to inpt rehabilitation center.  Preadmission Screen Completed By: Cleatrice Burke, 06/09/2016 10:04 AM ______________________________________________________________________  Discussed status with Dr. Naaman Plummer on 06/09/16 at 1248 and received telephone approval for admission today.  Admission Coordinator: Cleatrice Burke, time [id number] Date 06/09/2016          Cosigned by: Meredith Staggers, MD at 06/09/2016 1:45 PM  Revision History     Date/Time User Provider Type Action   06/09/2016 1:45 PM Meredith Staggers, MD Physician Cosign   06/09/2016 12:49 PM Sabrina Gong, RN Rehab Admission Coordinator Sign

## 2016-06-09 NOTE — PMR Pre-admission (Signed)
PMR Admission Coordinator Pre-Admission Assessment  Patient: [name] is an 70 y.o., female MRN: [id number] DOB: [birth year] Height: 4' 10.5" (148.6 cm) Weight: 56.6 kg (124 lb 12.5 oz)              Insurance Information HMO:     PPO: yes     PCP:      IPA:      80/20:      OTHER: Medicare advantage plan PRIMARY: Aetna Medicare      Policy#: [id number]      Subscriber: pt CM Name: Anderson Malta      Phone#: [phone]     Fax#: [phone] Pre-Cert#: [id number]      Employer: retired f/u with [employer] Paul at phone [phone] fax [phone]. Approved for 15 days with update due 06/23/16 Benefits:  Phone #: [phone]     Name: 06/08/16 Eff. Date: 12/18/14     Deduct: none      Out of Pocket Max: $4950      Life Max: none CIR: $318 copay per day days 1-5 then covers 100 %     SNF: no copay days 1-20; $164 copay per day days 21-100 Outpatient: $40 copay per visit     Co-Pay: per medical neccessity for visits Home Health: 100%      Co-Pay: per medical neccessity for visits DME: 80%     Co-Pay: 20% Providers: in [place]  SECONDARY: none        Medicaid Application Date:       Case Manager:  Disability Application Date:       Case Worker:   Emergency Contact Information Contact Information    Name Relation Home Work Mobile   Elcho Son   [phone]   Ryia, Cusic [id number]  [phone]   Dawnisha, Denino [id number]     Andraea, Leggio [phone]       Current Medical History  Patient Admitting Diagnosis: lumbar stenosis with radiculopathy  History of Present Illness: [name] is a 70 y.o. right handed female with history of hypertension, polymyositis, rheumatoid arthritis with chronic prednisone,Sjogren's disease, chronic back pain with history of lumbar fusion 2014 L2-L5.  Presented 05/31/2016 with progressive back pain radiating to the lower extremities. X-rays and imaging revealed marked disc degeneration spondylosis, stenosis with radiculopathy.  Underwent lumbar 5-sacral 1 anterior lumbar interbody fusion with anterior exposure thoracic 12-lumbar 1, lumbar 1-2 anterior lateral interbody fusion 06/02/2016 per Dr. Vertell Limber as well as Dr. Donnetta Hutching. Hospital course pain management. Cardiology services consulted 06/03/2016 for elevated troponin 0.18 and nonspecific chest pain. Workup suggested atypical chest pain elevated troponin likely due to demand ischemia of surgery and advised to begin aspirin 81 mg daily. Echocardiogram completed with ejection fraction of [id number] grade 1 diastolic dysfunction. No wall motion abnormalities. . Acute blood loss anemia 7.4 transfused latest hemoglobin 10.1.  Past Medical History  Past Medical History  Diagnosis Date  . HTN (hypertension)   . Glaucoma   . Sjogren's syndrome ([place])   . Raynaud phenomenon   . Polymyositis ([place])   . GERD (gastroesophageal reflux disease)   . Swallowing difficulty     unable to swallow whole pills  . Iron deficiency anemia     hx  . High cholesterol     "not severe; can't take the medicine" (09/08/2013)  . Multiple thyroid nodules   . Migraines     "hurt like a migraine; don't get them like I did when I was younger" (09/08/2013)  . OA (osteoarthritis)   .  Chronic lower back pain     "for 6 years; relieved since back OR 07/2013" (09/08/2013)  . Swelling of left lower extremity   . Pancreatitis   . Diverticulosis   . Shingles   . Osteoporosis   . Difficult intubation     "ive been told im small", no problems listed with back sx in past  . Family history of anesthesia complication     "sister has severe sleep apnea; she has alot of trouble w/anesthesia" (09/08/2013)  . AAA (abdominal aortic aneurysm) ([place])     small      report in epic 11/11 states no aaa    . History of hiatal hernia     "small one"  . 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 PHN (postherpetic neuralgia)     right side from shingles    Family History  family history includes Arthritis in her maternal uncle and  sister; Breast cancer in her father; Colon cancer (age of onset: 73) in her father; Diabetes in her sister and son; Heart disease in her brother and father; Hypertension in her father; Ovarian cancer in her mother; Psoriasis in her sister; Sarcoidosis in her sister.  Prior Rehab/Hospitalizations:  Has the patient had major surgery during 100 days prior to admission? No  Current Medications   Current facility-administered medications:  .  0.9 %  sodium chloride infusion, 250 mL, Intravenous, Continuous, Erline Levine, MD .  0.9 %  sodium chloride infusion, , Intravenous, Once, Newman Pies, MD .  0.9 % NaCl with KCl 20 mEq/ L  infusion, , Intravenous, Continuous, Erline Levine, MD, Last Rate: 100 mL/hr at 06/04/16 0236 .  acetaminophen (TYLENOL) solution 500 mg, 500 mg, Oral, Q4H PRN, Erline Levine, MD, 500 mg at 06/04/16 2000 .  [DISCONTINUED] acetaminophen (TYLENOL) tablet 650 mg, 650 mg, Oral, Q4H PRN **OR** acetaminophen (TYLENOL) suppository 650 mg, 650 mg, Rectal, Q4H PRN, Erline Levine, MD .  alum & mag hydroxide-simeth (MAALOX/MYLANTA) 200-200-20 MG/5ML suspension 30 mL, 30 mL, Oral, Q6H PRN, Erline Levine, MD, 30 mL at 06/08/16 0318 .  bisacodyl (DULCOLAX) suppository 10 mg, 10 mg, Rectal, Daily PRN, Erline Levine, MD, 10 mg at 06/06/16 0801 .  cholecalciferol (VITAMIN D) tablet 5,000 Units, 5,000 Units, Oral, Daily, Erline Levine, MD, 5,000 Units at 06/06/16 1004 .  docusate (COLACE) 50 MG/5ML liquid 100 mg, 100 mg, Oral, Daily, Erline Levine, MD, 100 mg at 06/08/16 1126 .  famotidine (PEPCID) 40 MG/5ML suspension 20 mg, 20 mg, Oral, Daily, Erline Levine, MD, 20 mg at 06/08/16 1032 .  ferrous sulfate tablet 325 mg, 325 mg, Oral, Q breakfast, Newman Pies, MD, 325 mg at 06/06/16 1005 .  HYDROcodone-acetaminophen (HYCET) 7.5-325 mg/15 ml solution 15 mL, 15 mL, Oral, Q4H PRN, Newman Pies, MD, 15 mL at 06/09/16 0630 .  HYDROmorphone (DILAUDID) injection 0.5-1 mg, 0.5-1 mg, Intravenous, Q2H  PRN, Erline Levine, MD, 1 mg at 06/02/16 2333 .  hydroxychloroquine (PLAQUENIL) tablet 200 mg, 200 mg, Oral, Daily, Erline Levine, MD, 200 mg at 06/02/16 1957 .  magnesium hydroxide (MILK OF MAGNESIA) suspension 15 mL, 15 mL, Oral, Daily PRN, Erline Levine, MD .  menthol-cetylpyridinium (CEPACOL) lozenge 3 mg, 1 lozenge, Oral, PRN **OR** phenol (CHLORASEPTIC) mouth spray 1 spray, 1 spray, Mouth/Throat, PRN, Erline Levine, MD, 1 spray at 06/04/16 1317 .  methocarbamol (ROBAXIN) tablet 500 mg, 500 mg, Oral, Q6H PRN, 500 mg at 06/08/16 2048 **OR** methocarbamol (ROBAXIN) 500 mg in dextrose 5 % 50 mL IVPB, 500 mg,  Intravenous, Q6H PRN, Erline Levine, MD, 500 mg at 06/04/16 2219 .  metoprolol tartrate (LOPRESSOR) 25 mg/10 mL oral suspension 25 mg, 25 mg, Oral, BID, Dayna N Dunn, PA-C, 25 mg at 06/08/16 2231 .  nystatin (MYCOSTATIN) 100000 UNIT/ML suspension 500,000 Units, 5 mL, Oral, QID, Erline Levine, MD, 500,000 Units at 06/08/16 2231 .  ondansetron (ZOFRAN) injection 4 mg, 4 mg, Intravenous, Q4H PRN, Erline Levine, MD, 4 mg at 06/05/16 0405 .  oxyCODONE-acetaminophen (PERCOCET/ROXICET) 5-325 MG per tablet 1-2 tablet, 1-2 tablet, Oral, Q4H PRN, Erline Levine, MD, 1 tablet at 06/08/16 2049 .  polyethylene glycol (MIRALAX / GLYCOLAX) packet 17 g, 17 g, Oral, Daily PRN, Erline Levine, MD .  predniSONE (DELTASONE) tablet 5 mg, 5 mg, Oral, Q breakfast, Erline Levine, MD, 5 mg at 06/09/16 0847 .  sodium chloride flush (NS) 0.9 % injection 3 mL, 3 mL, Intravenous, Q12H, Erline Levine, MD, 3 mL at 06/08/16 2232 .  sodium chloride flush (NS) 0.9 % injection 3 mL, 3 mL, Intravenous, PRN, Erline Levine, MD .  sodium phosphate (FLEET) 7-19 GM/118ML enema 1 enema, 1 enema, Rectal, Once PRN, Erline Levine, MD, 1 enema at 06/06/16 2201 .  tetrahydrozoline 0.05 % ophthalmic solution 2 drop, 2 drop, Both Eyes, Daily PRN, Erline Levine, MD, 2 drop at 06/02/16 1956 .  timolol (TIMOPTIC) 0.5 % ophthalmic solution 1 drop, 1 drop, Both  Eyes, QHS, Erline Levine, MD, 1 drop at 06/08/16 2109 .  traMADol (ULTRAM) tablet 50 mg, 50 mg, Oral, Q6H PRN, Erline Levine, MD, 50 mg at 06/08/16 2049  Patients Current Diet: DIET DYS 3 Room service appropriate?: Yes; Fluid consistency:: Thin  Precautions / Restrictions Precautions Precautions: Fall, Back Precaution Booklet Issued: No Precaution Comments: pt able to verbally recall 3/3 precautions. Needs cueing during functional activities. Spinal Brace: Lumbar corset, Applied in sitting position Restrictions Weight Bearing Restrictions: No   Has the patient had 2 or more falls or a fall with injury in the past year?No  Prior Activity Level Community (5-7x/wk): Mod I with her rollator RW and driving pta  [place] Devices/Equipment: Environmental consultant (specify type) Home Equipment: Walker - 4 wheels, Cane - single point, Bedside commode  Prior Device Use: Indicate devices/aids used by the patient prior to current illness, exacerbation or injury? Walker  Prior Functional Level Prior Function Level of Independence: Independent with assistive device(s) Comments: Multiple adaptations for her chronic weakness and medical issues.   Self Care: Did the patient need help bathing, dressing, using the toilet or eating?  Independent  Indoor Mobility: Did the patient need assistance with walking from room to room (with or without device)? Independent  Stairs: Did the patient need assistance with internal or external stairs (with or without device)? Independent  Functional Cognition: Did the patient need help planning regular tasks such as shopping or remembering to take medications? Independent  Current Functional Level Cognition  Overall Cognitive Status: Within Functional Limits for tasks assessed Orientation Level: Oriented X4    Extremity Assessment (includes Sensation/Coordination)  Upper Extremity Assessment: Generalized weakness  Lower Extremity  Assessment: Defer to PT evaluation RLE Deficits / Details: chronic weakness. Grossly 2/5 LLE Deficits / Details: chronic weakness. Grossly 2/5    ADLs  Overall ADL's : Needs assistance/impaired Eating/Feeding: Set up, Sitting Grooming: Set up, Sitting, Wash/dry face, Brushing hair, Minimal assistance Grooming Details (indicate cue type and reason): Min assist for knot in back of head Upper Body Bathing: Minimal assitance, Sitting Lower Body Bathing:  Maximal assistance, +2 for physical assistance, Sit to/from stand Upper Body Dressing : Maximal assistance, Sitting Upper Body Dressing Details (indicate cue type and reason): to don brace Lower Body Dressing: Maximal assistance Lower Body Dressing Details (indicate cue type and reason): to don socks Toilet Transfer: Moderate assistance, Stand-pivot, BSC Toilet Transfer Details (indicate cue type and reason): Simulated by transfer from EOB to chair. Pt with increased anxiety and distraction requiring max assist at end of functional mobility bout. Toileting- Clothing Manipulation and Hygiene: Maximal assistance, +2 for physical assistance, Sit to/from stand Functional mobility during ADLs: Moderate assistance (hand held assist, stand pivot only) General ADL Comments: Pt requesting to sit on BSC in attempts to have a BM; RN notified pt on BSC. Pt reports she will call for assist when she is ready to get up. Continues to be very anxious; repeats "I just dont want to fall" "Please dont let me fall".    Mobility  Overal bed mobility: Needs Assistance Bed Mobility: Rolling, Sidelying to Sit Rolling: Min assist Sidelying to sit: Mod assist, HOB elevated Sit to sidelying: Max assist General bed mobility comments: assist for LE management off bed and trunk elevation; increased time. good demo of log roll.    Transfers  Overall transfer level: Needs assistance Equipment used: Rolling walker (2 wheeled) Transfers: Sit to/from Stand Sit to Stand: Mod  assist, From elevated surface Stand pivot transfers: Mod assist General transfer comment: pt with unconventional way typically for getting up due to RA however pt able with bed elevated and legs in wide base of support and in extension able to achieve standing with min/modA from PT; stood from EOB x1, from [facility] x1, Transferred to chair post ambulation.    Ambulation / Gait / Stairs / Wheelchair Mobility  Ambulation/Gait Ambulation/Gait assistance: Museum/gallery curator (Feet): 100 Feet Assistive device: Rolling walker (2 wheeled) Gait Pattern/deviations: Step-through pattern, Decreased stride length, Wide base of support General Gait Details: labored effort with onset of fatigue, decreased step height, waddling like gait pattern Gait velocity: decreased  Gait velocity interpretation: Below normal speed for age/gender    Posture / Balance Dynamic Sitting Balance Sitting balance - Comments: pt able to maintain balance to don brace without increased pain or LOB Balance Overall balance assessment: Needs assistance Sitting-balance support: Feet supported, No upper extremity supported Sitting balance-Leahy Scale: Fair Sitting balance - Comments: pt able to maintain balance to don brace without increased pain or LOB Standing balance support: During functional activity Standing balance-Leahy Scale: Poor Standing balance comment: requires bilat UE support    Special needs/care consideration Skin 6/23 small amount bloody drainage present lowest aspect of lumbar incision per Brain Poteat RN with Dr. Vertell Limber. Dressing removed at bedside  Reveals intact dermabond, no erythema, no swelling and small amount thin bloody drainage at bottom of incision. Abdominal and right flank incisions without erythema, swelling or drainage.                           Bowel mgmt:LBM continent 6/22 after laxatives Bladder mgmt: ring pessary use  And wears pads due to incontinence Chronic dyphagia issues. Sees Dr.  Havery Moros. dyphagia felt to be due to achalasia . Recent workup also with Dr. Silverio Decamp 04/07/16 . Recommends f/u ENT also postoperatively.    Previous Home Environment Living Arrangements: Alone  Lives With: Alone Available Help at Discharge: Family, Available 24 hours/day Type of Home: House Home Layout: One level Home Access: Level entry  Bathroom Shower/Tub:  (does have bars installed in bathroom) Bathroom Toilet: Standard Bathroom Accessibility: Yes How Accessible: Accessible via walker [place]: No Additional Comments: Only sits on high surfaces Pt has risers under her bed to assist with standing from elevated surfaces. Many adaptions made in home for her chronic health needs. Pt has many particular ways that she manages her physical needs, her chronic swallowing needs. Very educated with her medication managements to manage her  Chronic comorbidities. Home is one level with small ramp entry  Discharge Living Setting Plans for Discharge Living Setting: Patient's home, Alone Type of Home at Discharge: House Discharge Home Layout: One level Discharge Home Access: Level entry Discharge Bathroom Shower/Tub: Tub/shower unit, Curtain (has bars installed in bathroom) Discharge Bathroom Toilet: Standard Discharge Bathroom Accessibility: Yes How Accessible: Accessible via walker Does the patient have any problems obtaining your medications?: No  Social/Family/Support Systems Patient Roles: Parent (Has 4 sons. Widow for 2 years) Contact Information: [name], youngest son pt states her main contact out of 4 sons Anticipated Caregiver: [name], son, [name] a close friend and other sons Anticipated Caregiver's Contact Information: see above Ability/Limitations of Caregiver: sons own [place] home. They rotate care to provide 24/7 care at home if needed Caregiver Availability: 24/7 (Sons will rotate at night to assist. Maudie Mercury will mainly be duri) Discharge Plan Discussed with  Primary Caregiver: Yes Is Caregiver In Agreement with Plan?: Yes Does Caregiver/Family have Issues with Lodging/Transportation while Pt is in Rehab?: No Sons are very comfortable assisting with adls as needed. Pt lived alone and was Mod I and driving. Very self motivated. Family very dedicated to assist Mom to forever remain in her home.  Goals/Additional Needs Patient/Family Goal for Rehab: supervision with PT, supervision to min assist OT, supervision with SLP Expected length of stay: [facility] 10- 14 days Equipment Needs: Pt has made many adaptions to her home and with equipment to manage her chronic illnesses Special Service Needs: Pt very indepndent and self sufficient at home. Due to her RA has raised surfaces in bed, etc for ease in standing Pt/Family Agrees to Admission and willing to participate: Yes Program Orientation Provided & Reviewed with Pt/Caregiver Including Roles  & Responsibilities: Yes  Decrease burden of Care through IP rehab admission: n/a  Possible need for SNF placement upon discharge:sons committed to assist Mom in remaining at home and will make accomodations to environment and supervision as needed.  Patient Condition: This patient's medical and functional status has changed since the consult dated 06/05/2016 in which the Rehabilitation Physician determined and documented that the patient was potentially appropriate for intensive rehabilitative care in an inpatient rehabilitation facility. Issues have been addressed and update has been discussed with Dr. Naaman Plummer and patient now appropriate for inpatient rehabilitation. Will admit to inpatient rehab today. Family can arrange 24/7 supervision to min assist at home and therefore pt appropriate candidate to admit to inpt rehabilitation center.  Preadmission Screen Completed By:  Cleatrice Burke, 06/09/2016 10:04 AM ______________________________________________________________________   Discussed status with Dr. Naaman Plummer on  06/09/16 at 1248 and received telephone approval for admission today.  Admission Coordinator:  Cleatrice Burke, time [id number] Date 06/09/2016

## 2016-06-09 NOTE — Progress Notes (Signed)
Ankit Lorie Phenix, MD Physician Signed Physical Medicine and Rehabilitation Consult Note 06/05/2016 7:49 AM  Related encounter: Admission (Current) from 06/02/2016 in [facility]        Physical Medicine and Rehabilitation Consult Reason for Consult: Lumbar stenosis with radiculopathy Referring Physician: Dr. Vertell Limber   HPI: [name] is a 70 y.o. right handed female with history of hypertension, polymyositis, rheumatoid arthritis,Sjogren's disease, chronic back pain with history of lumbar fusion 2014 L2-L5. Per chart review patient lives alone independent with assistive device prior to admission. One level home with level entry. She has assistance as needed from family. Presented 05/31/2016 with progressive back pain radiating to the lower extremities. X-rays and imaging revealed marked disc degeneration spondylosis, stenosis with radiculopathy. Underwent lumbar 5-sacral 1 anterior lumbar interbody fusion with anterior exposure thoracic 12-lumbar 1, lumbar 1-2 anterior lateral interbody fusion 06/02/2016 per Dr. Vertell Limber as well as Dr. Donnetta Hutching. Hospital course pain management. Cardiology services consulted 06/03/2016 for elevated troponin 0.18 and nonspecific chest pain. Workup suggested atypical chest pain elevated troponin likely due to demand ischemia of surgery. Echocardiogram is pending. Acute blood loss anemia 7.4 transfused latest hemoglobin 10.1. Physical therapy evaluation completed lumbar corset applied in sitting position. Recommendations for physical medicine rehabilitation consult.   Review of Systems  Constitutional: Negative for fever and chills.  HENT: Negative for hearing loss.  Respiratory: Positive for cough. Negative for shortness of breath.  Cardiovascular: Positive for leg swelling.   Nonspecific chest pain  Gastrointestinal: Positive for constipation. Negative for nausea and vomiting.   GERD    Genitourinary: Positive for urgency. Negative for dysuria and hematuria.  Musculoskeletal: Positive for myalgias and joint pain.  Neurological: Negative for dizziness, seizures and headaches.  All other systems reviewed and are negative.  Past Medical History  Diagnosis Date  . HTN (hypertension)   . Glaucoma   . Sjogren's syndrome ([place])   . Raynaud phenomenon   . Polymyositis ([place])   . GERD (gastroesophageal reflux disease)   . Swallowing difficulty     unable to swallow whole pills  . Iron deficiency anemia     hx  . High cholesterol     "not severe; can't take the medicine" (09/08/2013)  . Multiple thyroid nodules   . Migraines     "hurt like a migraine; don't get them like I did when I was younger" (09/08/2013)  . OA (osteoarthritis)   . Chronic lower back pain     "for 6 years; relieved since back OR 07/2013" (09/08/2013)  . Swelling of left lower extremity   . Pancreatitis   . Diverticulosis   . Shingles   . Osteoporosis   . Difficult intubation     "ive been told im small", no problems listed with back sx in past  . Family history of anesthesia complication     "sister has severe sleep apnea; she has alot of trouble w/anesthesia" (09/08/2013)  . AAA (abdominal aortic aneurysm) ([place])     small report in epic 11/11 states no aaa   . History of hiatal hernia     "small one"  . 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 PHN (postherpetic neuralgia)     right side from shingles   Past Surgical History  Procedure Laterality Date  . Cataract extraction w/ intraocular lens implant, bilateral Bilateral 12/2010-01/2011  . Esophageal dilation      "a few times before 02/2010; not since" (09/08/2013)  .  Repair zenker's diverticula  02/2010    "diverticulectomy w/zenker's pouch correction" (09/08/2013)  . Dental surgery  11/2011-12/2011    one on  each side in my bottom jaw; dental implants" (09/08/2013)  . Lumbar fusion  Aug 14, 2013    Dr. Consuello Masse  . Esophageal manometry N/A 02/25/2016    Procedure: ESOPHAGEAL MANOMETRY (EM); Surgeon: Manus Gunning, MD; Location: [facility]; Service: Gastroenterology; Laterality: N/A;  . Esophagogastroduodenoscopy     Family History  Problem Relation Age of Onset  . Ovarian cancer Mother   . Hypertension Father   . Heart disease Father   . Breast cancer Father     Age 64  . Colon cancer Father 81  . Psoriasis Sister   . Sarcoidosis Sister   . Arthritis Sister     psoriatic arthritis  . Diabetes Sister   . Arthritis Maternal Uncle     rheumatiod arthritis  . Diabetes Son   . Heart disease Brother    Social History:  reports that she has never smoked. She has never used smokeless tobacco. She reports that she does not drink alcohol or use illicit drugs. Allergies:  Allergies  Allergen Reactions  . Phenylephrine     Other reaction(s): Other (See Comments) -- phenylephrine eye drops only  Eye irritation, severe photophobia   Medications Prior to Admission  Medication Sig Dispense Refill  . acetaminophen (TYLENOL) 160 MG/5ML elixir Take 500 mg by mouth every 4 (four) hours as needed for fever. Reported on 05/16/2016    . Calcium-Vitamin D-Vitamin K (VIACTIV PO) Take 2 each by mouth daily. Reported on 05/16/2016    . Cholecalciferol (VITAMIN D3) 5000 UNIT/ML LIQD Take 5,000 Units by mouth daily. Reported on 05/16/2016    . diclofenac (VOLTAREN) 75 MG EC tablet Take 75 mg by mouth 2 (two) times daily.    . diphenhydrAMINE (BENADRYL) 12.5 MG/5ML elixir Take 6.25 mg by mouth 4 (four) times daily as needed for sleep.    Marland Kitchen FIBER SELECT GUMMIES CHEW Chew 2 each by mouth daily.     . hydroxychloroquine (PLAQUENIL) 200 MG tablet Take 200 mg by mouth daily.  Reported on 05/16/2016    . magnesium hydroxide (MILK OF MAGNESIA) 400 MG/5ML suspension Take 15 mLs by mouth daily as needed for mild constipation or heartburn. Reported on 05/16/2016    . omeprazole (PRILOSEC) 20 MG capsule Take 1 capsule (20 mg total) by mouth daily. Take one 30 min prior to breakfast. 30 capsule 3  . predniSONE (DELTASONE) 5 MG tablet Take 5 mg by mouth daily with breakfast.    . Tetrahydrozoline HCl (VISINE OP) Place 1 drop into both eyes daily as needed (dry eyes). Reported on 05/16/2016    . timolol (BETIMOL) 0.5 % ophthalmic solution Place 1 drop into both eyes at bedtime.    . traMADol (ULTRAM) 50 MG tablet Take 50 mg by mouth every morning.     . triamterene-hydrochlorothiazide (MAXZIDE-25) 37.5-25 MG tablet Take 1 tablet by mouth daily. Reported on 05/16/2016  0  . guaiFENesin (MUCINEX) 600 MG 12 hr tablet Take 600 mg by mouth 2 (two) times daily as needed for cough.    Marland Kitchen HYDROcodone-acetaminophen (HYCET) 7.5-325 mg/15 ml solution Take 10 mLs by mouth every 6 (six) hours as needed. pain  0  . methylPREDNISolone sodium succinate (SOLU-MEDROL) 1000 MG injection Inject 1,000 mg into the vein once. Reported on 05/16/2016      Home: Home Living Family/patient expects to be discharged to:: Private residence  Living Arrangements: Alone Available Help at Discharge: Family, Available 24 hours/day Type of Home: House Home Access: Level entry Home Layout: One level Bathroom Shower/Tub: Tub/shower unit Home Equipment: Environmental consultant - 4 wheels, Cane - single point, Bedside commode Additional Comments: Only sits on high surfaces  Functional History: Prior Function Level of Independence: Independent with assistive device(s) Comments: Multiple adaptations for her chronic weakness and medical issues.  Functional Status:  Mobility: Bed Mobility Overal bed mobility: Needs Assistance Bed Mobility: Rolling, Sidelying to Sit, Sit to  Sidelying Rolling: Max assist Sidelying to sit: +2 for physical assistance, Max assist Sit to sidelying: +2 for physical assistance, Mod assist General bed mobility comments: Assist to move legs and to elevate trunk into sitting. Assist to lower trunk and bring legs back up into bed. Transfers Overall transfer level: Needs assistance Equipment used: 4-wheeled walker Transfers: Sit to/from Stand Sit to Stand: +2 physical assistance, Mod assist, From elevated surface General transfer comment: Assist to bring hips up from elevated bed. Due to chonic weakness pt uses wide base and has to lock knees into hyperextension to support weight. Then walks legs back underneath her. Ambulation/Gait Ambulation/Gait assistance: Min assist, +2 safety/equipment Ambulation Distance (Feet): 10 Feet Assistive device: 4-wheeled walker Gait Pattern/deviations: Step-through pattern, Decreased step length - right, Decreased step length - left, Steppage, Shuffle, Wide base of support General Gait Details: Pt with multiple compensatory strageties for gait due to chronic LE weakness. Keeps LE's externally rotated and knees hyperextended in stance. Difficulty bringing LLE through at times. Gait velocity: decr  Gait velocity interpretation: Below normal speed for age/gender    ADL:    Cognition: Cognition Overall Cognitive Status: Within Functional Limits for tasks assessed Orientation Level: Oriented X4 Cognition Arousal/Alertness: Awake/alert Behavior During Therapy: WFL for tasks assessed/performed Overall Cognitive Status: Within Functional Limits for tasks assessed  Blood pressure 144/89, pulse 87, temperature 98.6 F (37 C), temperature source Oral, resp. rate 27, height 4' 10.5" (1.486 m), weight 56.6 kg (124 lb 12.5 oz), SpO2 100 %. Physical Exam  Vitals reviewed. Constitutional: She is oriented to person, place, and time. She appears well-developed and well-nourished.  HENT:  Head: Normocephalic  and atraumatic.  Eyes: Conjunctivae and EOM are normal.  Neck: Normal range of motion. Neck supple. No thyromegaly present.  Cardiovascular: Normal rate and regular rhythm.  Respiratory: Effort normal and breath sounds normal. No respiratory distress.  GI: Soft. Bowel sounds are normal. She exhibits no distension.  Musculoskeletal: She exhibits edema and tenderness.  Arthritic changes in joints  Neurological: She is alert and oriented to person, place, and time.  Sensation intact to light touch Motor: Bilateral upper extremities: 4 -/5 proximal to distal. Left upper extremity and limited due to RA Right lower extremity: Hip flexion 2/5, knee extension 2+/5, ankle dorsi/plantar flexion 3/5 Left lower extremity: Hip flexion 1/5, knee extension 2/5, ankle dorsi/plantar flexion 2+/5  Skin: Skin is warm and dry.  Back incision is dressed appropriately tender  Psychiatric: She has a normal mood and affect. Her behavior is normal.     Lab Results Last 24 Hours    Results for orders placed or performed during the hospital encounter of 06/02/16 (from the past 24 hour(s))  Prepare RBC Status: None   Collection Time: 06/04/16 10:29 AM  Result Value Ref Range   Order Confirmation ORDER PROCESSED BY BLOOD BANK   Troponin I Status: Abnormal   Collection Time: 06/04/16 3:21 PM  Result Value Ref Range   Troponin I 0.90 (HH) <  0.031 ng/mL  Troponin I Status: Abnormal   Collection Time: 06/04/16 9:00 PM  Result Value Ref Range   Troponin I 0.95 (HH) <0.031 ng/mL  Troponin I Status: Abnormal   Collection Time: 06/05/16 2:30 AM  Result Value Ref Range   Troponin I 0.81 (HH) <0.031 ng/mL  CBC Status: Abnormal   Collection Time: 06/05/16 2:30 AM  Result Value Ref Range   WBC 11.1 (H) 4.0 - 10.5 K/uL   RBC 3.67 (L) 3.87 - 5.11 MIL/uL   Hemoglobin 10.1 (L) 12.0 - 15.0 g/dL   HCT 29.9 (L) 36.0 - 46.0 %   MCV  81.5 78.0 - 100.0 fL   MCH 27.5 26.0 - 34.0 pg   MCHC 33.8 30.0 - 36.0 g/dL   RDW 14.7 11.5 - 15.5 %   Platelets 158 150 - 400 K/uL      Imaging Results (Last 48 hours)    No results found.    Assessment/Plan: Diagnosis: Lumbar stenosis with radiculopathy Labs and images independently reviewed. Records reviewed and summated above.  1. Does the need for close, 24 hr/day medical supervision in concert with the patient's rehab needs make it unreasonable for this patient to be served in a less intensive setting? Yes  2. Co-Morbidities requiring supervision/potential complications: HTN (monitor and provide prns in accordance with increased physical exertion and pain), polymyositis (cont meds), rheumatoid arthritis (cont meds), Sjogren's disease (cont meds), chronic back pain with history of lumbar fusion 2014 L2-L5 (Biofeedback training with therapies to help reduce reliance on opiate pain medications, monitor pain control during therapies, and sedation at rest and titrate to maximum efficacy to ensure participation and gains in therapies), Acute blood loss anemia (transfuse if necessary to ensure appropriate perfusion for increased activity tolerance), leukocytosis (cont to monitor for signs and symptoms of infection, further workup if indicated), hypokalemia (continue to monitor and replete as necessary) 3. Due to safety, skin/wound care, disease management, pain management and patient education, does the patient require 24 hr/day rehab nursing? Yes 4. Does the patient require coordinated care of a physician, rehab nurse, PT (1-2 hrs/day, 5 days/week), OT (1-2 hrs/day, 5 days/week) and SLP (1-2 hrs/day, 5 days/week) to address physical and functional deficits in the context of the above medical diagnosis(es)? Yes Addressing deficits in the following areas: balance, endurance, locomotion, strength, transferring, bathing, dressing, toileting, speech, swallowing and psychosocial  support 5. Can the patient actively participate in an intensive therapy program of at least 3 hrs of therapy per day at least 5 days per week? Potentially 6. The potential for patient to make measurable gains while on inpatient rehab is excellent 7. Anticipated functional outcomes upon discharge from inpatient rehab are min assist and mod assist with PT, min assist and mod assist with OT, independent and modified independent with SLP. 8. Estimated rehab length of stay to reach the above functional goals is: 16-19 days. 9. Does the patient have adequate social supports and living environment to accommodate these discharge functional goals? Potentially 10. Anticipated D/C setting: Home 11. Anticipated post D/C treatments: HH therapy and Home excercise program 12. Overall Rehab/Functional Prognosis: fair  RECOMMENDATIONS: This patient's condition is appropriate for continued rehabilitative care in the following setting: Will need to clarify availability of caregivers support at discharge. Patient with significant comorbidities at baseline, requiring assistance with ADLs. She will unlikely be able to return to an independent level of living after a short IRF. Will also await completion of medical workup.  Patient has agreed to participate in  recommended program. Yes Note that insurance prior authorization may be required for reimbursement for recommended care.  Comment: Rehab Admissions Coordinator to follow up.  Delice Lesch, MD 06/05/2016       Revision History     Date/Time User Provider Type Action   06/05/2016 2:26 PM Ankit Lorie Phenix, MD Physician Sign   06/05/2016 8:03 AM Cathlyn Parsons, PA-C Physician Assistant Pend   View Details Report       Routing History     Date/Time From To Method   06/05/2016 2:26 PM Ankit Lorie Phenix, MD Deland Pretty, MD Fax

## 2016-06-09 NOTE — Progress Notes (Addendum)
Report given to [facility] on [facility]. Pt will be transfer to 45[facility]6 once room is available. Pt/family aware. Margaretha Sheffield from 45M will notify RN.   Ave Filter, RN

## 2016-06-09 NOTE — Clinical Social Work Note (Signed)
CSW received referral for SNF.  CSW was informed by CIR that patient will be accepted today.  CSW to sign off please re-consult if social work needs arise.  Jones Broom. [place], MSW, [place]

## 2016-06-09 NOTE — Progress Notes (Signed)
Operative site cleaned with saline and covered with newney comb dressing.  Ave Filter, RN

## 2016-06-09 NOTE — Progress Notes (Signed)
Subjective: Patient reports "It's burning at the bottom of my incision. This on my hip is from where I had shingles"  Objective: Vital signs in last 24 hours: Temp:  [99 F (37.2 C)-99.4 F (37.4 C)] 99.1 F (37.3 C) (06/23 0538) Pulse Rate:  [61-86] 61 (06/23 0538) Resp:  [14-18] 14 (06/23 0538) BP: (129-155)/(53-84) 155/79 mmHg (06/23 0538) SpO2:  [97 %-100 %] 99 % (06/23 0538)  Intake/Output from previous day: 06/22 0701 - 06/23 0700 In: 3 [I.V.:3] Out: -  Intake/Output this shift:    Alert, conversant, smiling. Acknowledges daily improvements. Small amount bloody drainage present from lowest aspect of lumbar incision. Drsg removal reveals intact Dermabond, no erythema, no swelling, and small amount thin bloody drainage at bottom of incision. Abdominal & right flank incisions without erythema, swelling, or drainage.  Belly is soft,nondistended, nontender. Strength is improving BLE: she repositions her legs for logrolling with minimal assistance.   Lab Results: No results for input(s): WBC, HGB, HCT, PLT in the last 72 hours. BMET  Recent Labs  06/06/16 1305 06/07/16 0536  NA 130* 132*  K 4.3 3.9  CL 103 100*  CO2 23 25  GLUCOSE 111* 104*  BUN 6 <5*  CREATININE 0.48 0.43*  CALCIUM 8.5* 8.7*    Studies/Results: No results found.  Assessment/Plan: Improving   LOS: 7 days  Drsgs removed abdomen and right flank. Drsg changed to thoracolumbar incision, leaving upper half open to air. Dermabond remains on all incisions. Ok per DrStern to change drsg as needed, monitoring drainage. Encouraged frequent position changes to minimize pressure on sacrum and distal portion of incision (as well as kyphotic thoracic curve).  Hopeful of CIR bed availability.    Verdis Prime 06/09/2016, 7:28 AM

## 2016-06-09 NOTE — Care Management Important Message (Signed)
Important Message  Patient Details  Name: [name] MRN: [id number] Date of Birth: Jan 06, 1946   Medicare Important Message Given:  Yes    Loann Quill 06/09/2016, 9:51 AM

## 2016-06-09 NOTE — Discharge Summary (Signed)
Physician Discharge Summary  Patient ID: [name] MRN: [id number] DOB/AGE: 1946-07-26 70 y.o.  Admit date: 06/02/2016 Discharge date: 06/09/2016  Admission Diagnoses: Thoracogenic scoliosis of thoracolumbar region; Spinal stenosis of lumbar region; Scoliosis, Idiopathic with lumbago and severe radiculopathy   Discharge Diagnoses: Thoracogenic scoliosis of thoracolumbar region; Spinal stenosis of lumbar region; Scoliosis, Idiopathic with lumbago and severe radiculopathy s/p Lumbar five-sacral one Anterior lumbar interbody fusion with Dr. Donnetta Hutching for approach (N/A) Exploration of Fusion Lumbar two-five, Thoracic ten-ileum posterior fusion, Posterolateral arthrodesis with AIRO (Bilateral) APPLICATION OF INTRAOPERATIVE CT SCAN (N/A) ABDOMINAL EXPOSURE (N/A) Thoracic twelve-Lumbar one, Lumbar one-two anterior Lateral Interbody Fusion   Active Problems:   Back pain   Other secondary scoliosis, thoracolumbar region   Atypical chest pain   Acute blood loss anemia   Chronic low back pain   Failed back syndrome   Leukocytosis   Polymyositis ([facility])   Rheumatoid arthritis ([place])   Sjogren's disease ([place])   Surgery, elective   Discharged Condition: good  Hospital Course: [name] was admitted for surgery with dx scoliosis, stenosis, and radiculopathy. Following uncomplicated thoracolumbar fusion (ALIF, XLIF, and posterior fixation using intra-op CT) she recovered well, initially in Neuro ICU and transferred to [facility] for nursing care and therapies. She has progressed steadily, but slowly. Chest pain prompted cardiology evaluation but required no treatments.   Consults: cardiology  Significant Diagnostic Studies: radiology: CT scan: intra-op  Treatments: surgery: Lumbar five-sacral one Anterior lumbar interbody fusion with Dr. Donnetta Hutching for approach (N/A) Exploration of Fusion Lumbar two-five, Thoracic ten-ileum posterior fusion, Posterolateral arthrodesis with AIRO (Bilateral) APPLICATION  OF INTRAOPERATIVE CT SCAN (N/A) ABDOMINAL EXPOSURE (N/A) Thoracic twelve-Lumbar one, Lumbar one-two anterior Lateral Interbody Fusion   Discharge Exam: Blood pressure 155/79, pulse 61, temperature 99.1 F (37.3 C), temperature source Oral, resp. rate 14, height 4' 10.5" (1.486 m), weight 56.6 kg (124 lb 12.5 oz), SpO2 99 %. Alert, conversant, smiling. Acknowledges daily improvements. Small amount bloody drainage present from lowest aspect of lumbar incision. Drsg removal reveals intact Dermabond, no erythema, no swelling, and small amount thin bloody drainage at bottom of incision. Abdominal & right flank incisions without erythema, swelling, or drainage.  Belly is soft,nondistended, nontender. Strength is improving BLE: she repositions her legs for logrolling with minimal assistance.  Drsgs removed abdomen and right flank. Drsg changed to thoracolumbar incision, leaving upper half open to air. Dermabond remains on all incisions. Ok per DrStern to change drsg as needed, monitoring drainage. Encouraged frequent position changes to minimize pressure on sacrum and distal portion of incision (as well as kyphotic thoracic curve).  Hopeful of CIR bed availability.    Disposition: Discharge to [place].  Office f/u in 3-4 weeks. Ok to shower. Ok to start 81mg  ASA daily. Ok to leave incisions open to air when no longer draining.      Medication List    STOP taking these medications        diclofenac 75 MG EC tablet  Commonly known as:  VOLTAREN      TAKE these medications        acetaminophen 160 MG/5ML elixir  Commonly known as:  TYLENOL  Take 500 mg by mouth every 4 (four) hours as needed for fever. Reported on 05/16/2016     diphenhydrAMINE 12.5 MG/5ML elixir  Commonly known as:  BENADRYL  Take 6.25 mg by mouth 4 (four) times daily as needed for sleep.     FIBER SELECT GUMMIES Chew  Chew 2 each by mouth  daily.     guaiFENesin 600 MG 12 hr tablet  Commonly known as:   MUCINEX  Take 600 mg by mouth 2 (two) times daily as needed for cough.     HYDROcodone-acetaminophen 7.5-325 mg/15 ml solution  Commonly known as:  HYCET  Take 10 mLs by mouth every 6 (six) hours as needed. pain     hydroxychloroquine 200 MG tablet  Commonly known as:  PLAQUENIL  Take 200 mg by mouth daily. Reported on 05/16/2016     magnesium hydroxide 400 MG/5ML suspension  Commonly known as:  MILK OF MAGNESIA  Take 15 mLs by mouth daily as needed for mild constipation or heartburn. Reported on 05/16/2016     methylPREDNISolone sodium succinate 1000 MG injection  Commonly known as:  SOLU-MEDROL  Inject 1,000 mg into the vein once. Reported on 05/16/2016     omeprazole 20 MG capsule  Commonly known as:  PRILOSEC  Take 1 capsule (20 mg total) by mouth daily. Take one 30 min prior to breakfast.     predniSONE 5 MG tablet  Commonly known as:  DELTASONE  Take 5 mg by mouth daily with breakfast.     timolol 0.5 % ophthalmic solution  Commonly known as:  BETIMOL  Place 1 drop into both eyes at bedtime.     traMADol 50 MG tablet  Commonly known as:  ULTRAM  Take 50 mg by mouth every morning.     triamterene-hydrochlorothiazide 37.5-25 MG tablet  Commonly known as:  MAXZIDE-25  Take 1 tablet by mouth daily. Reported on 05/16/2016     VIACTIV PO  Take 2 each by mouth daily. Reported on 05/16/2016     VISINE OP  Place 1 drop into both eyes daily as needed (dry eyes). Reported on 05/16/2016     Vitamin D3 5000 UNIT/ML Liqd  Take 5,000 Units by mouth daily. Reported on 05/16/2016         Signed: Verdis Prime 06/09/2016, 11:39 AM

## 2016-06-10 ENCOUNTER — Inpatient Hospital Stay (HOSPITAL_COMMUNITY): Payer: Medicare HMO | Admitting: Occupational Therapy

## 2016-06-10 ENCOUNTER — Inpatient Hospital Stay (HOSPITAL_COMMUNITY): Payer: Medicare HMO | Admitting: Speech Pathology

## 2016-06-10 ENCOUNTER — Inpatient Hospital Stay (HOSPITAL_COMMUNITY): Payer: Medicare HMO | Admitting: *Deleted

## 2016-06-10 DIAGNOSIS — M5417 Radiculopathy, lumbosacral region: Secondary | ICD-10-CM

## 2016-06-10 MED ORDER — MAGNESIUM HYDROXIDE 400 MG/5ML PO SUSP
30.0000 mL | Freq: Every day | ORAL | Status: DC | PRN
  Administered 2016-06-10: 30 mL via ORAL
  Filled 2016-06-10: qty 30

## 2016-06-10 NOTE — Interval H&P Note (Signed)
[name] was admitted 06/09/16 to Inpatient Rehabilitation with the diagnosis of lumbar stenosis/radiculopathy s/p fusion.  The patient's history has been reviewed, patient examined, and there is no change in status.  Patient continues to be appropriate for intensive inpatient rehabilitation.  I have reviewed the patient's chart and labs.  Questions were answered to the patient's satisfaction. The PAPE has been reviewed and assessment remains appropriate. H&P was performed yesterday and is being placed in the rehab encounter for the purpose of charting.  [name] T 06/10/2016, 8:10 PM

## 2016-06-10 NOTE — Progress Notes (Signed)
Patient arrived on unit via bed at 1930 with family. Patient and family provided with safety information and Rehabilitation packet information. Patient is alert oriented x4.  Needs attended. Safety maintained.

## 2016-06-10 NOTE — H&P (View-Only) (Signed)
Physical Medicine and Rehabilitation Admission H&P    Chief complaint: Back pain  HPI: [name] is a 70 y.o. right handed female with history of hypertension, polymyositis, rheumatoid arthritis with chronic prednisone,Sjogren's disease, chronic back pain with history of lumbar fusion 2014 L2-L5. Per chart review patient lives alone independent with assistive device prior to admission. One level home with level entry. She has assistance as needed from family. Presented 05/31/2016 with progressive back pain radiating to the lower extremities. X-rays and imaging revealed marked disc degeneration spondylosis, stenosis with radiculopathy. Underwent lumbar 5-sacral 1 anterior lumbar interbody fusion with anterior exposure thoracic 12-lumbar 1, lumbar 1-2 anterior lateral interbody fusion 06/02/2016 per Dr. Vertell Limber as well as Dr. Donnetta Hutching. Hospital course pain management. Cardiology services consulted 06/03/2016 for elevated troponin 0.18 and nonspecific chest pain. Workup suggested atypical chest pain elevated troponin likely due to demand ischemia of surgery and advised to begin aspirin 81 mg daily. Echocardiogram completed with ejection fraction of [id number] grade 1 diastolic dysfunction. No wall motion abnormalities. . Acute blood loss anemia 7.4 transfused latest hemoglobin 10.1. Physical and occupational therapy evaluations completed lumbar corset applied in sitting position. Recommendations for physical medicine rehabilitation consult. Patient was admitted for a comprehensive rehabilitation program  ROS Constitutional: Negative for fever and chills.  HENT: Negative for hearing loss.  Respiratory: Positive for cough. Negative for shortness of breath.  Cardiovascular: Positive for leg swelling.   Nonspecific chest pain  Gastrointestinal: Positive for constipation. Negative for nausea and vomiting.   GERD  Genitourinary: Positive for urgency. Negative for dysuria and hematuria.    Musculoskeletal: Positive for myalgias and joint pain.  Neurological: Negative for dizziness, seizures and headaches.   All other systems reviewed and are negative   Past Medical History  Diagnosis Date  . HTN (hypertension)   . Glaucoma   . Sjogren's syndrome ([place])   . Raynaud phenomenon   . Polymyositis ([place])   . GERD (gastroesophageal reflux disease)   . Swallowing difficulty     unable to swallow whole pills  . Iron deficiency anemia     hx  . High cholesterol     "not severe; can't take the medicine" (09/08/2013)  . Multiple thyroid nodules   . Migraines     "hurt like a migraine; don't get them like I did when I was younger" (09/08/2013)  . OA (osteoarthritis)   . Chronic lower back pain     "for 6 years; relieved since back OR 07/2013" (09/08/2013)  . Swelling of left lower extremity   . Pancreatitis   . Diverticulosis   . Shingles   . Osteoporosis   . Difficult intubation     "ive been told im small", no problems listed with back sx in past  . Family history of anesthesia complication     "sister has severe sleep apnea; she has alot of trouble w/anesthesia" (09/08/2013)  . AAA (abdominal aortic aneurysm) ([place])     small      report in epic 11/11 states no aaa    . History of hiatal hernia     "small one"  . 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 PHN (postherpetic neuralgia)     right side from shingles   Past Surgical History  Procedure Laterality Date  . Cataract extraction w/ intraocular lens  implant, bilateral Bilateral 12/2010-01/2011  . Esophageal dilation      "a few times before 02/2010; not since" (09/08/2013)  . Repair zenker's diverticula  02/2010    "  diverticulectomy w/zenker's pouch correction" (09/08/2013)  . Dental surgery  11/2011-12/2011    one on each side in my bottom jaw; dental implants" (09/08/2013)  . Lumbar fusion  Aug 14, 2013    Dr. Consuello Masse  . Esophageal manometry N/A 02/25/2016    Procedure: ESOPHAGEAL MANOMETRY (EM);  Surgeon: Manus Gunning, MD;  Location: [facility];  Service: Gastroenterology;  Laterality: N/A;  . Esophagogastroduodenoscopy    . Anterior lumbar fusion N/A 06/02/2016    Procedure: Lumbar five-sacral one Anterior lumbar interbody fusion with Dr. Donnetta Hutching for approach;  Surgeon: Erline Levine, MD;  Location: [place] [facility];  Service: Neurosurgery;  Laterality: N/A;  . Posterior lumbar fusion 4 level Bilateral 06/02/2016    Procedure: Exploration of Fusion Lumbar two-five, Thoracic ten-ileum posterior fusion, Posterolateral arthrodesis with AIRO;  Surgeon: Erline Levine, MD;  Location: [place] [facility];  Service: Neurosurgery;  Laterality: Bilateral;  . Application of intraoperative ct scan N/A 06/02/2016    Procedure: APPLICATION OF INTRAOPERATIVE CT SCAN;  Surgeon: Erline Levine, MD;  Location: [place] [facility];  Service: Neurosurgery;  Laterality: N/A;  . Anterior lat lumbar fusion  06/02/2016    Procedure: Thoracic twelve-Lumbar one, Lumbar one-two anterior Lateral Interbody Fusion;  Surgeon: Erline Levine, MD;  Location: [place] [facility];  Service: Neurosurgery;;  . Abdominal exposure N/A 06/02/2016    Procedure: ABDOMINAL EXPOSURE;  Surgeon: Rosetta Posner, MD;  Location: [facility];  Service: Vascular;  Laterality: N/A;   Family History  Problem Relation Age of Onset  . Ovarian cancer Mother   . Hypertension Father   . Heart disease Father   . Breast cancer Father     Age 27  . Colon cancer Father 32  . Psoriasis Sister   . Sarcoidosis Sister   . Arthritis Sister     psoriatic arthritis  . Diabetes Sister   . Arthritis Maternal Uncle     rheumatiod arthritis  . Diabetes Son   . Heart disease Brother    Social History:  reports that she has never smoked. She has never used smokeless tobacco. She reports that she does not drink alcohol or use illicit drugs. Allergies:  Allergies  Allergen Reactions  . Phenylephrine     Other reaction(s): Other (See Comments) -- phenylephrine eye drops only  Eye irritation,  severe photophobia   Medications Prior to Admission  Medication Sig Dispense Refill  . acetaminophen (TYLENOL) 160 MG/5ML elixir Take 500 mg by mouth every 4 (four) hours as needed for fever. Reported on 05/16/2016    . Calcium-Vitamin D-Vitamin K (VIACTIV PO) Take 2 each by mouth daily. Reported on 05/16/2016    . Cholecalciferol (VITAMIN D3) 5000 UNIT/ML LIQD Take 5,000 Units by mouth daily. Reported on 05/16/2016    . diclofenac (VOLTAREN) 75 MG EC tablet Take 75 mg by mouth 2 (two) times daily.    . diphenhydrAMINE (BENADRYL) 12.5 MG/5ML elixir Take 6.25 mg by mouth 4 (four) times daily as needed for sleep.    Marland Kitchen FIBER SELECT GUMMIES CHEW Chew 2 each by mouth daily.     . hydroxychloroquine (PLAQUENIL) 200 MG tablet Take 200 mg by mouth daily. Reported on 05/16/2016    . magnesium hydroxide (MILK OF MAGNESIA) 400 MG/5ML suspension Take 15 mLs by mouth daily as needed for mild constipation or heartburn. Reported on 05/16/2016    . omeprazole (PRILOSEC) 20 MG capsule Take 1 capsule (20 mg total) by mouth daily. Take one 30 min prior to breakfast. 30 capsule  3  . predniSONE (DELTASONE) 5 MG tablet Take 5 mg by mouth daily with breakfast.    . Tetrahydrozoline HCl (VISINE OP) Place 1 drop into both eyes daily as needed (dry eyes). Reported on 05/16/2016    . timolol (BETIMOL) 0.5 % ophthalmic solution Place 1 drop into both eyes at bedtime.    . traMADol (ULTRAM) 50 MG tablet Take 50 mg by mouth every morning.     . triamterene-hydrochlorothiazide (MAXZIDE-25) 37.5-25 MG tablet Take 1 tablet by mouth daily. Reported on 05/16/2016  0  . guaiFENesin (MUCINEX) 600 MG 12 hr tablet Take 600 mg by mouth 2 (two) times daily as needed for cough.    Marland Kitchen HYDROcodone-acetaminophen (HYCET) 7.5-325 mg/15 ml solution Take 10 mLs by mouth every 6 (six) hours as needed. pain  0  . methylPREDNISolone sodium succinate (SOLU-MEDROL) 1000 MG injection Inject 1,000 mg into the vein once. Reported on 05/16/2016       Home: Home Living Family/patient expects to be discharged to:: Unsure Living Arrangements: Alone Available Help at Discharge: Family, Available 24 hours/day Type of Home: House Home Access: Level entry Home Layout: One level Bathroom Shower/Tub:  (does have bars installed in bathroom) Bathroom Toilet: Standard Bathroom Accessibility: Yes Home Equipment: Walker - 4 wheels, Cane - single point, Bedside commode Additional Comments: Only sits on high surfaces  Lives With: Alone   Functional History: Prior Function Level of Independence: Independent with assistive device(s) Comments: Multiple adaptations for her chronic weakness and medical issues.   Functional Status:  Mobility: Bed Mobility Overal bed mobility: Needs Assistance Bed Mobility: Rolling, Sidelying to Sit Rolling: Min assist Sidelying to sit: Mod assist, HOB elevated Sit to sidelying: Max assist General bed mobility comments: assist for LE management off bed and trunk elevation; increased time. good demo of log roll. Transfers Overall transfer level: Needs assistance Equipment used: Rolling walker (2 wheeled) Transfers: Sit to/from Stand Sit to Stand: Mod assist, From elevated surface Stand pivot transfers: Mod assist General transfer comment: pt with unconventional way typically for getting up due to RA however pt able with bed elevated and legs in wide base of support and in extension able to achieve standing with min/modA from PT; stood from EOB x1, from [facility] x1, Transferred to chair post ambulation. Ambulation/Gait Ambulation/Gait assistance: Min assist Ambulation Distance (Feet): 100 Feet Assistive device: Rolling walker (2 wheeled) Gait Pattern/deviations: Step-through pattern, Decreased stride length, Wide base of support General Gait Details: labored effort with onset of fatigue, decreased step height, waddling like gait pattern Gait velocity: decreased  Gait velocity interpretation: Below normal speed  for age/gender    ADL: ADL Overall ADL's : Needs assistance/impaired Eating/Feeding: Set up, Sitting Grooming: Set up, Sitting, Wash/dry face, Brushing hair, Minimal assistance Grooming Details (indicate cue type and reason): Min assist for knot in back of head Upper Body Bathing: Minimal assitance, Sitting Lower Body Bathing: Maximal assistance, +2 for physical assistance, Sit to/from stand Upper Body Dressing : Maximal assistance, Sitting Upper Body Dressing Details (indicate cue type and reason): to don brace Lower Body Dressing: Maximal assistance Lower Body Dressing Details (indicate cue type and reason): to don socks Toilet Transfer: Moderate assistance, Stand-pivot, BSC Toilet Transfer Details (indicate cue type and reason): Simulated by transfer from EOB to chair. Pt with increased anxiety and distraction requiring max assist at end of functional mobility bout. Toileting- Clothing Manipulation and Hygiene: Maximal assistance, +2 for physical assistance, Sit to/from stand Functional mobility during ADLs: Moderate assistance (hand held assist, stand  pivot only) General ADL Comments: Pt requesting to sit on BSC in attempts to have a BM; RN notified pt on BSC. Pt reports she will call for assist when she is ready to get up. Continues to be very anxious; repeats "I just dont want to fall" "Please dont let me fall".  Cognition: Cognition Overall Cognitive Status: Within Functional Limits for tasks assessed Orientation Level: Oriented X4 Cognition Arousal/Alertness: Awake/alert Behavior During Therapy: Anxious Overall Cognitive Status: Within Functional Limits for tasks assessed  Physical Exam: Blood pressure 138/80, pulse 75, temperature 98.4 F (36.9 C), temperature source Oral, resp. rate 20, height 4' 10.5" (1.486 m), weight 56.6 kg (124 lb 12.5 oz), SpO2 100 %. Physical Exam Constitutional: She is oriented to person, place, and time. She appears well-developed and  well-nourished.  HENT: oral mucosa pink and moist Head: Normocephalic and atraumatic.  Eyes: Conjunctivae and EOM are normal.  Neck: Normal range of motion. Neck supple. No thyromegaly present.  Cardiovascular: Normal rate and regular rhythm.small SEM Respiratory: Effort normal and breath sounds normal. No respiratory distress.  GI: Soft. Bowel sounds are normal. She exhibits no distension.  Musculoskeletal: She exhibits edema and tenderness.  Arthritic changes in joints noted Neurological: She is alert and oriented to person, place, and time.  Sensation intact to light touch in all 4's. DTR's 1+ Motor: Bilateral upper extremities: 4 -/5 bicep,tricep to 4/5 distally. Left>right shoulders limited due to RA Right lower extremity: Hip flexion 2/5, knee extension 2+/5, ankle dorsi/plantar flexion 3/5 Left lower extremity: Hip flexion 1/5, knee extension 2/5, ankle dorsi/plantar flexion 2+/5  Skin: Skin is warm and dry.  Back incision is dressed with sero-sang drainage distally. Does have a residual rash right flank.   Psychiatric: She has a normal mood and affect. Her behavior is normal   No results found for this or any previous visit (from the past 48 hour(s)). No results found.     Medical Problem List and Plan: 1.  Decreased functional mobility secondary to lumbar stenosis with radiculopathy L5-S1 anterior lumbar interbody fusion with anterior exposure 06/02/2016. Lumbar corset when out of bed 2.  DVT Prophylaxis/Anticoagulation: SCDs. Check vascular studies upon admit. 3. Pain Management: Percocet and Robaxin as needed. Monitor with increased mobility/therapy  -kpad/ice prn  4. Acute blood loss anemia. Follow-up CBC. Continue iron supplement 5. Neuropsych: This patient is capable of making decisions on her own behalf. 6. Skin/Wound Care: Routine skin checks. Dry dressing/honeycomb to surgical site---persistent inferior serosanguinous drainage.  7. Fluids/Electrolytes/Nutrition:  Routine I&O's with follow-up chemistries upon admit. 8. Rheumatoid arthritis/polymyositis. Continue Plaquenil 200 mg daily, prednisone 5 mg daily 9. Hypertension. Lopressor 25 mg twice a day.    Post Admission Physician Evaluation: 1. Functional deficits secondary  to lumbar stenosis and radiculopathy s/p lumbar decompression and fusion. 2. Patient is admitted to receive collaborative, interdisciplinary care between the physiatrist, rehab nursing staff, and therapy team. 3. Patient's level of medical complexity and substantial therapy needs in context of that medical necessity cannot be provided at a lesser intensity of care such as a SNF. 4. Patient has experienced substantial functional loss from his/her baseline which was documented above under the "Functional History" and "Functional Status" headings.  Judging by the patient's diagnosis, physical exam, and functional history, the patient has potential for functional progress which will result in measurable gains while on inpatient rehab.  These gains will be of substantial and practical use upon discharge  in facilitating mobility and self-care at the household level. 5. Physiatrist will  provide 24 hour management of medical needs as well as oversight of the therapy plan/treatment and provide guidance as appropriate regarding the interaction of the two. 6. 24 hour rehab nursing will assist with bladder management, bowel management, safety, skin/wound care, disease management, medication administration, pain management and patient education  and help integrate therapy concepts, techniques,education, etc. 7. PT will assess and treat for/with: Lower extremity strength, range of motion, stamina, balance, functional mobility, safety, adaptive techniques and equipment, NMR, back precautions, pain mgt, activity tolerance.   Goals are: supervision to mod I with basic mobility and self-care. 8. OT will assess and treat for/with: ADL's, functional mobility,  safety, upper extremity strength, adaptive techniques and equipment, NMR, pain mgt, spine precautions, community reintegration.   Goals are: supervision/min asssist to mod I. Therapy may NOT YET proceed with showering this patient. 9. SLP will assess and treat for/with: n/a.  Goals are: n/a. 10. Case Management and Social Worker will assess and treat for psychological issues and discharge planning. 11. Team conference will be held weekly to assess progress toward goals and to determine barriers to discharge. 12. Patient will receive at least 3 hours of therapy per day at least 5 days per week. 13. ELOS: 8-11 days       14. Prognosis:  excellent     Meredith Staggers, MD, [facility] 06/09/2016

## 2016-06-10 NOTE — Evaluation (Signed)
Physical Therapy Assessment and Plan  Patient Details  Name: [name] MRN: [id number] Date of Birth: November 30, 1946  PT Diagnosis: Difficulty walking Rehab Potential: Good ELOS: 12-14 days   Today's Date: 06/10/2016 PT Individual Time: 1250-1415 PT Individual Time Calculation (min): 85 min    Problem List:  Patient Active Problem List   Diagnosis Date Noted  . Radiculopathy 06/09/2016  . Acute blood loss anemia   . Chronic low back pain   . Failed back syndrome   . Leukocytosis   . Polymyositis ([place])   . Rheumatoid arthritis ([place])   . Sjogren's disease ([place])   . Surgery, elective   . Atypical chest pain   . Back pain 06/02/2016  . 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 Dysphagia   . Swelling of left lower extremity 10/15/2013  . Essential hypertension 10/15/2013  . UTI (lower urinary tract infection) 09/08/2013  . Elevated lipase 09/08/2013  . Weakness 09/08/2013  . Dehydration 09/08/2013  . S/P lumbar spinal fusion 09/08/2013  . Fever 09/08/2013  . Nausea 09/08/2013  . Headache 09/08/2013  . Hypokalemia 09/08/2013  . Hyponatremia 09/08/2013  . Anemia 09/08/2013  . Pancreatitis 09/08/2013  . Difficulty swallowing pills 09/08/2013  . Unspecified deficiency anemia 03/14/2013  . Uterine prolapse 04/16/2012  . Cystocele 04/16/2012  . Rectocele 04/16/2012  . Osteopenia   . IRON DEFICIENCY 01/12/2010  . 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 DYSPHAGIA UNSPECIFIED 01/12/2010    Past Medical History:  Past Medical History  Diagnosis Date  . HTN (hypertension)   . Glaucoma   . Sjogren's syndrome ([place])   . Raynaud phenomenon   . Polymyositis ([place])   . GERD (gastroesophageal reflux disease)   . Swallowing difficulty     unable to swallow whole pills  . Iron deficiency anemia     hx  . High cholesterol     "not severe; can't take the medicine" (09/08/2013)  . Multiple thyroid nodules   . Migraines     "hurt like a migraine;  don't get them like I did when I was younger" (09/08/2013)  . OA (osteoarthritis)   . Chronic lower back pain     "for 6 years; relieved since back OR 07/2013" (09/08/2013)  . Swelling of left lower extremity   . Pancreatitis   . Diverticulosis   . Shingles   . Osteoporosis   . Difficult intubation     "ive been told im small", no problems listed with back sx in past  . Family history of anesthesia complication     "sister has severe sleep apnea; she has alot of trouble w/anesthesia" (09/08/2013)  . AAA (abdominal aortic aneurysm) ([place])     small      report in epic 11/11 states no aaa    . History of hiatal hernia     "small one"  . 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 PHN (postherpetic neuralgia)     right side from shingles   Past Surgical History:  Past Surgical History  Procedure Laterality Date  . Cataract extraction w/ intraocular lens  implant, bilateral Bilateral 12/2010-01/2011  . Esophageal dilation      "a few times before 02/2010; not since" (09/08/2013)  . Repair zenker's diverticula  02/2010    "diverticulectomy w/zenker's pouch correction" (09/08/2013)  . Dental surgery  11/2011-12/2011    one on each side in my bottom jaw; dental implants" (09/08/2013)  . Lumbar fusion  Aug 14, 2013    Dr. Consuello Masse  .  Esophageal manometry N/A 02/25/2016    Procedure: ESOPHAGEAL MANOMETRY (EM);  Surgeon: Manus Gunning, MD;  Location: [facility];  Service: Gastroenterology;  Laterality: N/A;  . Esophagogastroduodenoscopy    . Anterior lumbar fusion N/A 06/02/2016    Procedure: Lumbar five-sacral one Anterior lumbar interbody fusion with Dr. Donnetta Hutching for approach;  Surgeon: Erline Levine, MD;  Location: [place] NEURO ORS;  Service: Neurosurgery;  Laterality: N/A;  . Posterior lumbar fusion 4 level Bilateral 06/02/2016    Procedure: Exploration of Fusion Lumbar two-five, Thoracic ten-ileum posterior fusion, Posterolateral arthrodesis with AIRO;  Surgeon: Erline Levine, MD;  Location: [place] NEURO ORS;   Service: Neurosurgery;  Laterality: Bilateral;  . Application of intraoperative ct scan N/A 06/02/2016    Procedure: APPLICATION OF INTRAOPERATIVE CT SCAN;  Surgeon: Erline Levine, MD;  Location: [place] NEURO ORS;  Service: Neurosurgery;  Laterality: N/A;  . Anterior lat lumbar fusion  06/02/2016    Procedure: Thoracic twelve-Lumbar one, Lumbar one-two anterior Lateral Interbody Fusion;  Surgeon: Erline Levine, MD;  Location: [place] NEURO ORS;  Service: Neurosurgery;;  . Abdominal exposure N/A 06/02/2016    Procedure: ABDOMINAL EXPOSURE;  Surgeon: Rosetta Posner, MD;  Location: [facility] NEURO ORS;  Service: Vascular;  Laterality: N/A;    Assessment & Plan Clinical Impression: 70 y.o. right handed female with history of hypertension, polymyositis, rheumatoid arthritis with chronic prednisone,Sjogren's disease, chronic back pain with history of lumbar fusion 2014 L2-L5. Per chart review patient lives alone independent with assistive device prior to admission. One level home with level entry. She has assistance as needed from family. Presented 05/31/2016 with progressive back pain radiating to the lower extremities. X-rays and imaging revealed marked disc degeneration spondylosis, stenosis with radiculopathy. Underwent lumbar 5-sacral 1 anterior lumbar interbody fusion with anterior exposure thoracic 12-lumbar 1, lumbar 1-2 anterior lateral interbody fusion 06/02/2016 per Dr. Vertell Limber as well as Dr. Donnetta Hutching. Hospital course pain management. Cardiology services consulted 06/03/2016 for elevated troponin 0.18 and nonspecific chest pain. Workup suggested atypical chest pain elevated troponin likely due to demand ischemia of surgery and advised to begin aspirin 81 mg daily. Echocardiogram completed with ejection fraction of 48% grade 1 diastolic dysfunction. .  Patient transferred to CIR on 06/09/2016 .   Patient currently requires mod with mobility secondary to muscle weakness.  Prior to hospitalization, patient was modified  independent  with mobility and lived with Alone in a House home.  Home access is  Ramped entrance.  Patient will benefit from skilled PT intervention to maximize safe functional mobility for planned discharge home alone.  Anticipate patient will benefit from follow up [place] at discharge.  PT - End of Session Activity Tolerance: Tolerates 30+ min activity with multiple rests Endurance Deficit: Yes PT Assessment Rehab Potential (ACUTE/IP ONLY): Good Barriers to Discharge: Decreased caregiver support PT Patient demonstrates impairments in the following area(s): Balance;Endurance;Motor;Pain;Safety PT Transfers Functional Problem(s): Bed Mobility;Bed to Chair;Car PT Locomotion Functional Problem(s): Ambulation;Stairs PT Plan PT Intensity: Minimum of 1-2 x/day ,45 to 90 minutes PT Frequency: 5 out of 7 days PT Duration Estimated Length of Stay: 12-14 days PT Treatment/Interventions: Ambulation/gait training;Pain management;Stair training;Therapeutic Exercise;Therapeutic Activities;UE/LE Strength taining/ROM;UE/LE Coordination activities;Functional mobility training PT Transfers Anticipated Outcome(s): Mod I  PT Locomotion Anticipated Outcome(s): Mod I PT Recommendation Recommendations for Other Services: Neuropsych consult Follow Up Recommendations: Home health PT Patient destination: Home Equipment Recommended: To be determined  Skilled Therapeutic Intervention Patient in room, sitting EOB , finishing lunch ,agrees to therapy. Evaluation completed. Patient requires min to mod A  for sit to stand and transfers, refuses use of w/c.  Standing from bed raised very high and manual assistance applied at IT. Standing with min A and poor balance, posterior lean. Patient is very vocal, and set in her ways about performing all mobility. Bed mobility with mod A, difficulty sequencing. Patient able to perform multiple rolling side to side and supine <=>sit  with mod A.  Gait Training with RW 2 x 50 feet , 2 x  100 feet with CGA to min A, poor gait pattern with wide BOS, decreased step length, decreased hip rotation, poor feet clearance, occasional knee buckling. At the end of session patient positioned in bed and left with family. Back precautions reviewed , and patient able to recall, but needed increased cues and explanation about twisting and arching.   PT Evaluation Precautions/Restrictions Precautions Precautions: Fall;Back Required Braces or Orthoses: Spinal Brace Spinal Brace: Lumbar corset;Applied in sitting position General Chart Reviewed: No Family/Caregiver Present: Yes Vital SignsTherapy Vitals Temp: 98.1 F (36.7 C) Temp Source: Oral Pulse Rate: 85 Resp: 20 BP: (!) 143/69 mmHg Patient Position (if appropriate): Sitting Oxygen Therapy SpO2: 96 % O2 Device: Not Delivered Pain Pain Assessment Pain Assessment: 0-10 Pain Score: 5  Pain Type: Surgical pain Pain Location: Hip Pain Orientation: Right;Left Pain Descriptors / Indicators: Burning;Aching Pain Onset: On-going Patients Stated Pain Goal: 0 Pain Intervention(s): Repositioned Multiple Pain Sites: No Home Living/Prior Functioning Home Living Available Help at Discharge: Family Type of Home: House Home Access: Ramped entrance Home Layout: One level Bathroom Shower/Tub: Chiropodist: Standard Bathroom Accessibility: No  Lives With: Alone Prior Function Level of Independence: Independent with basic ADLs;Independent with homemaking with ambulation  Able to Take Stairs?: No Driving: Yes Vocation: Retired Tax adviser Overall Cognitive Status: Within Functional Limits for tasks assessed Arousal/Alertness: Awake/alert Orientation Level: Oriented X4 Selective Attention: Appears intact Memory: Appears intact Awareness: Appears intact Problem Solving: Appears intact Sensation Sensation Light Touch: Appears Intact Stereognosis: Appears Intact Hot/Cold: Appears  Intact Proprioception: Appears Intact Coordination Gross Motor Movements are Fluid and Coordinated: No Motor  Motor Motor: Abnormal postural alignment and control  Mobility Bed Mobility Bed Mobility: Rolling Right;Rolling Left Rolling Right: 4: Min assist Rolling Right Details: Tactile cues for sequencing;Verbal cues for technique;Manual facilitation for placement Rolling Left: 4: Min assist Rolling Left Details: Tactile cues for sequencing;Tactile cues for placement;Manual facilitation for placement;Verbal cues for precautions/safety Transfers Transfers: Yes Sit to Stand: 3: Mod assist Sit to Stand Details: Verbal cues for precautions/safety;Verbal cues for technique;Tactile cues for posture;Tactile cues for sequencing Locomotion  Ambulation Ambulation: Yes Ambulation/Gait Assistance: 4: Min assist Ambulation Distance (Feet): 100 Feet Assistive device: Rolling walker Ambulation/Gait Assistance Details: Verbal cues for technique;Verbal cues for gait pattern;Manual facilitation for placement;Manual facilitation for weight bearing;Manual facilitation for weight shifting;Verbal cues for precautions/safety Gait Gait: No Stairs / Additional Locomotion Stairs: No Wheelchair Mobility Wheelchair Mobility: No  Trunk/Postural Assessment  Thoracic Assessment Thoracic Assessment: Exceptions to [facility] Lumbar Assessment Lumbar Assessment: Exceptions to [facility] Postural Control Postural Control: Deficits on evaluation  Balance Balance Balance Assessed: Yes Static Standing Balance Static Standing - Balance Support: Bilateral upper extremity supported;During functional activity Dynamic Standing Balance Dynamic Standing - Balance Support: Bilateral upper extremity supported;During functional activity Extremity Assessment      RLE Assessment RLE Assessment: Exceptions to [facility] RLE Strength Right Hip Flexion: 3-/5 Right Knee Flexion: 2+/5 Right Knee Extension: 2+/5 Right Ankle Dorsiflexion:  3-/5 Right Ankle Plantar Flexion: 3-/5 LLE Assessment LLE  Assessment: Exceptions to [facility] LLE Strength Left Hip Flexion: 3-/5 Left Knee Flexion: 3-/5 Left Knee Extension: 2/5 Left Ankle Dorsiflexion: 3-/5 Left Ankle Plantar Flexion: 3-/5   See Function Navigator for Current Functional Status.   Refer to Care Plan for Long Term Goals  Recommendations for other services: Neuropsych  Discharge Criteria: Patient will be discharged from PT if patient refuses treatment 3 consecutive times without medical reason, if treatment goals not met, if there is a change in medical status, if patient makes no progress towards goals or if patient is discharged from hospital.  The above assessment, treatment plan, treatment alternatives and goals were discussed and mutually agreed upon: by patient  Guadlupe Spanish 06/10/2016, 3:43 PM

## 2016-06-10 NOTE — Progress Notes (Signed)
70 y.o. right handed female with history of hypertension, polymyositis, rheumatoid arthritis with chronic prednisone,Sjogren's disease, chronic back pain with history of lumbar fusion 2014 L2-L5. Per chart review patient lives alone independent with assistive device prior to admission. One level home with level entry. She has assistance as needed from family. Presented 05/31/2016 with progressive back pain radiating to the lower extremities. X-rays and imaging revealed marked disc degeneration spondylosis, stenosis with radiculopathy. Underwent lumbar 5-sacral 1 anterior lumbar interbody fusion with anterior exposure thoracic 12-lumbar 1, lumbar 1-2 anterior lateral interbody fusion 06/02/2016 per Dr. Vertell Limber as well as Dr. Donnetta Hutching. Hospital course pain management. Cardiology services consulted 06/03/2016 for elevated troponin 0.18 and nonspecific chest pain. Workup suggested atypical chest pain elevated troponin likely due to demand ischemia of surgery and advised to begin aspirin 81 mg daily. Echocardiogram completed with ejection fraction of [id number] grade 1 diastolic dysfunction.  Subjective/Complaints: Long hx of dysphagia, work up included Ba esophagram, MBS and Fees as well as upper EGD.  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 rate 18, height 4\' 10"  (1.473 m), weight 58.741 kg (129 lb 8 oz), SpO2 100 %. No results found. No results found for this or any previous visit (from the past 72 hour(s)).   HEENT: normal Cardio: RRR Resp: CTA B/L GI: BS positive and mild distention and tympany Extremity:  Pulses positive and No Edema Skin:   Intact and Wound abd wound CDI Neuro: Alert/Oriented and Abnormal Motor 4+ in BUE 3- BLE Musc/Skel:  Other limited lumbar an thoracic ROM Gen NAD   Assessment/Plan: 1. Functional deficits secondary to Lumbar radiculopathy  s/p extension of Lumbar fusion to include L5-S1 as well as T12-L2 ant and post approach which require 3+ hours per day of interdisciplinary therapy in a comprehensive inpatient rehab setting. Physiatrist is providing close team supervision and 24 hour management of active medical problems listed below. Physiatrist and rehab team continue to assess barriers to discharge/monitor patient progress toward functional and medical goals. FIM:                   Function - Comprehension Comprehension: Auditory Comprehension assist level: Follows basic conversation/direction with no assist  Function - Expression Expression: Verbal Expression assist level: Expresses basic needs/ideas: With no assist  Function - Social Interaction Social Interaction assist level: Interacts appropriately with others with medication or extra time (anti-anxiety, antidepressant).  Function - Problem Solving Problem solving assist level: Solves basic problems with no assist  Function - Memory Memory assist level: Recognizes or recalls 90% of the time/requires cueing < 10% of the time Patient normally able to recall (first 3 days only): Current season, Staff names and faces, That he or she is in a hospital   Medical Problem List and Plan: 1.  Decreased functional mobility secondary to lumbar stenosis with radiculopathy. Lumbar corset when out of bed, CIR PT, OT  2.  DVT Prophylaxis/Anticoagulation: SCDs. Check vascular studies upon admit. 3. Pain Management: Percocet and Robaxin as needed. Monitor with increased mobility/therapy             -kpad/ice prn   4. Acute blood loss anemia. Follow-up CBC. Continue iron supplement 5. Neuropsych: This patient is capable of making decisions on her own behalf. 6. Skin/Wound Care: Routine skin checks. Dry dressing/honeycomb to surgical site---persistent inferior serosanguinous drainage.   7. Fluids/Electrolytes/Nutrition: Routine I&O's with follow-up chemistries upon  admit. 8. Rheumatoid arthritis/polymyositis. Continue Plaquenil 200 mg daily, prednisone 5 mg daily 9. Hypertension. Lopressor 25 mg twice a day. 10.  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

## 2016-06-10 NOTE — Evaluation (Signed)
Occupational Therapy Assessment and Plan  Patient Details  Name: [name] MRN: [id number] Date of Birth: 1946-03-25  OT Diagnosis: abnormal posture, muscle weakness (generalized) and pain in thoracic spine Rehab Potential: Rehab Potential (ACUTE ONLY): Good ELOS:  14 days  Today's Date: 06/10/2016 OT Individual Time: 1100-1230 OT Individual Time Calculation (min): 90 min     Problem List:  Patient Active Problem List   Diagnosis Date Noted  . Radiculopathy 06/09/2016  . Acute blood loss anemia   . Chronic low back pain   . Failed back syndrome   . Leukocytosis   . Polymyositis ([place])   . Rheumatoid arthritis ([place])   . Sjogren's disease ([place])   . Surgery, elective   . Atypical chest pain   . Back pain 06/02/2016  . 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 Dysphagia   . Swelling of left lower extremity 10/15/2013  . Essential hypertension 10/15/2013  . UTI (lower urinary tract infection) 09/08/2013  . Elevated lipase 09/08/2013  . Weakness 09/08/2013  . Dehydration 09/08/2013  . S/P lumbar spinal fusion 09/08/2013  . Fever 09/08/2013  . Nausea 09/08/2013  . Headache 09/08/2013  . Hypokalemia 09/08/2013  . Hyponatremia 09/08/2013  . Anemia 09/08/2013  . Pancreatitis 09/08/2013  . Difficulty swallowing pills 09/08/2013  . Unspecified deficiency anemia 03/14/2013  . Uterine prolapse 04/16/2012  . Cystocele 04/16/2012  . Rectocele 04/16/2012  . Osteopenia   . IRON DEFICIENCY 01/12/2010  . 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 DYSPHAGIA UNSPECIFIED 01/12/2010    Past Medical History:  Past Medical History  Diagnosis Date  . HTN (hypertension)   . Glaucoma   . Sjogren's syndrome ([place])   . Raynaud phenomenon   . Polymyositis ([place])   . GERD (gastroesophageal reflux disease)   . Swallowing difficulty     unable to swallow whole pills  . Iron deficiency anemia     hx  . High cholesterol     "not severe; can't take the medicine"  (09/08/2013)  . Multiple thyroid nodules   . Migraines     "hurt like a migraine; don't get them like I did when I was younger" (09/08/2013)  . OA (osteoarthritis)   . Chronic lower back pain     "for 6 years; relieved since back OR 07/2013" (09/08/2013)  . Swelling of left lower extremity   . Pancreatitis   . Diverticulosis   . Shingles   . Osteoporosis   . Difficult intubation     "ive been told im small", no problems listed with back sx in past  . Family history of anesthesia complication     "sister has severe sleep apnea; she has alot of trouble w/anesthesia" (09/08/2013)  . AAA (abdominal aortic aneurysm) ([place])     small      report in epic 11/11 states no aaa    . History of hiatal hernia     "small one"  . 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 PHN (postherpetic neuralgia)     right side from shingles   Past Surgical History:  Past Surgical History  Procedure Laterality Date  . Cataract extraction w/ intraocular lens  implant, bilateral Bilateral 12/2010-01/2011  . Esophageal dilation      "a few times before 02/2010; not since" (09/08/2013)  . Repair zenker's diverticula  02/2010    "diverticulectomy w/zenker's pouch correction" (09/08/2013)  . Dental surgery  11/2011-12/2011    one on each side in my bottom jaw; dental implants" (09/08/2013)  .  Lumbar fusion  Aug 14, 2013    Dr. Consuello Masse  . Esophageal manometry N/A 02/25/2016    Procedure: ESOPHAGEAL MANOMETRY (EM);  Surgeon: Manus Gunning, MD;  [facility]: [facility];  Service: Gastroenterology;  Laterality: N/A;  . Esophagogastroduodenoscopy    . Anterior lumbar fusion N/A 06/02/2016    Procedure: Lumbar five-sacral one Anterior lumbar interbody fusion with Dr. Donnetta Hutching for approach;  Surgeon: Erline Levine, MD;  Location: [place] NEURO ORS;  Service: Neurosurgery;  Laterality: N/A;  . Posterior lumbar fusion 4 level Bilateral 06/02/2016    Procedure: Exploration of Fusion Lumbar two-five, Thoracic ten-ileum posterior fusion,  Posterolateral arthrodesis with AIRO;  Surgeon: Erline Levine, MD;  Location: [place] NEURO ORS;  Service: Neurosurgery;  Laterality: Bilateral;  . Application of intraoperative ct scan N/A 06/02/2016    Procedure: APPLICATION OF INTRAOPERATIVE CT SCAN;  Surgeon: Erline Levine, MD;  Location: [place] NEURO ORS;  Service: Neurosurgery;  Laterality: N/A;  . Anterior lat lumbar fusion  06/02/2016    Procedure: Thoracic twelve-Lumbar one, Lumbar one-two anterior Lateral Interbody Fusion;  Surgeon: Erline Levine, MD;  Location: [place] NEURO ORS;  Service: Neurosurgery;;  . Abdominal exposure N/A 06/02/2016    Procedure: ABDOMINAL EXPOSURE;  Surgeon: Rosetta Posner, MD;  Location: [facility];  Service: Vascular;  Laterality: N/A;    Assessment & Plan Clinical Impression: : [name] is a 70 y.o. right handed female with history of hypertension, polymyositis, rheumatoid arthritis with chronic prednisone,Sjogren's disease, chronic back pain with history of lumbar fusion 2014 L2-L5. Per chart review patient lives alone independent with assistive device prior to admission. One level home with level entry. She has assistance as needed from family. Presented 05/31/2016 with progressive back pain radiating to the lower extremities. X-rays and imaging revealed marked disc degeneration spondylosis, stenosis with radiculopathy. Underwent lumbar 5-sacral 1 anterior lumbar interbody fusion with anterior exposure thoracic 12-lumbar 1, lumbar 1-2 anterior lateral interbody fusion 06/02/2016 per Dr. Vertell Limber as well as Dr. Donnetta Hutching. Hospital course pain management. Cardiology services consulted 06/03/2016 for elevated troponin 0.18 and nonspecific chest pain. Workup suggested atypical chest pain elevated troponin likely due to demand ischemia of surgery and advised to begin aspirin 81 mg daily. Echocardiogram completed with ejection fraction of 85% grade 1 diastolic dysfunction. No wall motion abnormalities. . Acute blood loss anemia 7.4  transfused latest hemoglobin 10.1. Physical and occupational therapy  Patient transferred to CIR on 06/09/2016 .    Patient currently requires max with basic self-care skills secondary to muscle weakness and muscle joint tightness.  Prior to hospitalization, patient could complete BADl with max.  Patient will benefit from skilled intervention to decrease level of assist with basic self-care skills prior to discharge home independently.  Anticipate patient will require intermittent supervision and follow up home health.  OT - End of Session Activity Tolerance: Tolerates 30+ min activity with multiple rests Endurance Deficit: Yes OT Assessment Rehab Potential (ACUTE ONLY): Good Barriers to Discharge: Decreased caregiver support OT Plan OT Intensity: Minimum of 1-2 x/day, 45 to 90 minutes OT Frequency: 5 out of 7 days OT Duration/Estimated Length of Stay: 12-14 days OT Treatment/Interventions: Balance/vestibular training;Community reintegration;Disease mangement/prevention;Functional mobility training OT Recommendation Patient destination: Home Follow Up Recommendations: Home health OT Equipment Recommended: Tub/shower bench   Skilled Therapeutic Intervention Pt engaged in bed mobility,transfers, sitting and standing balance.  Pt very talkative.  Transferred to [facility] after donning TLSO with SBA.  Pt needed bed raised to 32 inches before able to go from sit  to stand and pivot to bed.  No shower ordered yet.  Pt lives alone but says can have assistance from family if needed.  Explained POC, and eLOS.  Left EOB with lunch and all needs in reach.    OT Evaluation Precautions/Restrictions  Precautions Precautions: Fall;Back Precaution Booklet Issued: No Precaution Comments: pt able to verbally recall 3/3 precautions. Needs cueing during functional activities. Required Braces or Orthoses: Spinal Brace Spinal Brace: Lumbar corset;Applied in sitting position Restrictions Weight Bearing  Restrictions: No    Vital Signs Therapy Vitals Temp: 98.1 F (36.7 C) Temp Source: Oral Pulse Rate: 85 Resp: 20 BP: (!) 143/69 mmHg Patient Position (if appropriate): Sitting Oxygen Therapy SpO2: 96 % O2 Device: Not Delivered Pain Pain Assessment Pain Assessment: 0-10 Pain Score: 5  Pain Type: Surgical pain Pain Location: Hip Pain Orientation: Right;Left Pain Descriptors / Indicators: Burning;Aching Pain Onset: On-going Patients Stated Pain Goal: 0 Pain Intervention(s): Repositioned Multiple Pain Sites: No Home Living/Prior Functioning Home Living Family/patient expects to be discharged to:: Unsure Living Arrangements: Alone Available Help at Discharge: Family, Available 24 hours/day Type of Home: House Home Access: Level entry Home Layout: One level Bathroom Shower/Tub: Tub/shower unit, Curtain ( grab bars x2; 3 insside;steps over tub;) Bathroom Toilet: Handicapped height (has 3n1 over toilet;; has uses arms and not legs as much.  ) Bathroom Accessibility: Yes Additional Comments: Only sits on high surfaces  Lives With: Alone IADL History Homemaking Responsibilities: Yes Meal Prep Responsibility: Primary Laundry Responsibility: Primary Cleaning Responsibility: Primary Bill Paying/Finance Responsibility: Primary Shopping Responsibility: Primary Child Care Responsibility: No Current License: Yes Mode of Transportation: Car Occupation: Retired Leisure and Hobbies: pianist for church; cooks for family 1x wk Prior Function Level of Independence: Independent with basic ADLs, Independent with homemaking with ambulation  Able to Take Stairs?: No Driving: Yes Vocation: Retired ADL   Vision/Perception  Vision- History Baseline Vision/History: Wears glasses Wears Glasses: At all times Patient Visual Report: No change from baseline Vision- Assessment Vision Assessment?: No apparent visual deficits Perception Perception: Within Functional Limits Praxis Praxis:  Intact  Cognition Overall Cognitive Status: Within Functional Limits for tasks assessed Arousal/Alertness: Awake/alert Orientation Level: Person;Place;Situation Year: 2017 Month: June Day of Week: Correct Memory: Appears intact Immediate Memory Recall: Sock;Blue;Bed Memory Recall: Sock;Blue;Bed Memory Recall Sock: Without Cue Memory Recall Blue: Without Cue Memory Recall Bed: Without Cue Attention: Selective Selective Attention: Appears intact Awareness: Appears intact Problem Solving: Appears intact Safety/Judgment: Appears intact Sensation Sensation Light Touch: Appears Intact Stereognosis: Appears Intact Hot/Cold: Appears Intact Proprioception: Appears Intact Coordination Gross Motor Movements are Fluid and Coordinated: No Fine Motor Movements are Fluid and Coordinated: No Motor  Motor Motor: Abnormal postural alignment and control Mobility  Bed Mobility Bed Mobility: Rolling Right;Rolling Left Rolling Right: 4: Min assist Rolling Right Details: Tactile cues for posture;Tactile cues for placement Rolling Left: 4: Min assist Rolling Left Details: Tactile cues for placement;Tactile cues for posture Transfers Sit to Stand: 2: Max assist Sit to Stand Details: Tactile cues for posture Stand to Sit: 3: Mod assist Stand to Sit Details (indicate cue type and reason): Manual facilitation for weight shifting  Trunk/Postural Assessment  Cervical Assessment Cervical Assessment: Within Functional Limits Thoracic Assessment Thoracic Assessment: Within Functional Limits Lumbar Assessment Lumbar Assessment: Exceptions to [facility] Lumbar Strength Overall Lumbar Strength: Deficits Postural Control Postural Control: Deficits on evaluation Righting Reactions: posterior bias Protective Responses: slow and poor Postural Limitations:  (decreased leg strength)  Balance Balance Balance Assessed: Yes Dynamic Sitting Balance Sitting balance - Comments:  maintains sitting balance  EOB Static Standing Balance Static Standing - Balance Support: Bilateral upper extremity supported;During functional activity Dynamic Standing Balance Dynamic Standing - Balance Support: Bilateral upper extremity supported;During functional activity Extremity/Trunk Assessment RUE Assessment RUE Assessment: Within Functional Limits LUE Assessment LUE Assessment: Within Functional Limits   See Function Navigator for Current Functional Status.   Refer to Care Plan for Long Term Goals  Recommendations for other services: None  Discharge Criteria: Patient will be discharged from OT if patient refuses treatment 3 consecutive times without medical reason, if treatment goals not met, if there is a change in medical status, if patient makes no progress towards goals or if patient is discharged from hospital.  The above assessment, treatment plan, treatment alternatives and goals were discussed and mutually agreed upon: by patient  Lisa Roca 06/10/2016, 4:30 PM

## 2016-06-10 NOTE — Evaluation (Signed)
Speech Language Pathology Assessment and Plan  Patient Details  Name: [name] MRN: [id number] Date of Birth: [birth year]  SLP Diagnosis: Dysphagia  Rehab Potential: Fair ELOS: 7 days of speech tx- likely CIR stay longer for PT/OT needs    Today's Date: 06/10/2016 SLP Individual Time: 0930-1030 SLP Individual Time Calculation (min): 60 min   Problem List:  Patient Active Problem List   Diagnosis Date Noted  . Radiculopathy 06/09/2016  . Acute blood loss anemia   . Chronic low back pain   . Failed back syndrome   . Leukocytosis   . Polymyositis ([place])   . Rheumatoid arthritis ([place])   . Sjogren's disease ([place])   . Surgery, elective   . Atypical chest pain   . Back pain 06/02/2016  . 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 Dysphagia   . Swelling of left lower extremity 10/15/2013  . Essential hypertension 10/15/2013  . UTI (lower urinary tract infection) 09/08/2013  . Elevated lipase 09/08/2013  . Weakness 09/08/2013  . Dehydration 09/08/2013  . S/P lumbar spinal fusion 09/08/2013  . Fever 09/08/2013  . Nausea 09/08/2013  . Headache 09/08/2013  . Hypokalemia 09/08/2013  . Hyponatremia 09/08/2013  . Anemia 09/08/2013  . Pancreatitis 09/08/2013  . Difficulty swallowing pills 09/08/2013  . Unspecified deficiency anemia 03/14/2013  . Uterine prolapse 04/16/2012  . Cystocele 04/16/2012  . Rectocele 04/16/2012  . Osteopenia   . IRON DEFICIENCY 01/12/2010  . 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 DYSPHAGIA UNSPECIFIED 01/12/2010   Past Medical History:  Past Medical History  Diagnosis Date  . HTN (hypertension)   . Glaucoma   . Sjogren's syndrome ([place])   . Raynaud phenomenon   . Polymyositis ([place])   . GERD (gastroesophageal reflux disease)   . Swallowing difficulty     unable to swallow whole pills  . Iron deficiency anemia     hx  . High cholesterol     "not severe; can't take the medicine" (09/08/2013)  . Multiple thyroid  nodules   . Migraines     "hurt like a migraine; don't get them like I did when I was younger" (09/08/2013)  . OA (osteoarthritis)   . Chronic lower back pain     "for 6 years; relieved since back OR 07/2013" (09/08/2013)  . Swelling of left lower extremity   . Pancreatitis   . Diverticulosis   . Shingles   . Osteoporosis   . Difficult intubation     "ive been told im small", no problems listed with back sx in past  . Family history of anesthesia complication     "sister has severe sleep apnea; she has alot of trouble w/anesthesia" (09/08/2013)  . AAA (abdominal aortic aneurysm) ([place])     small      report in epic 11/11 states no aaa    . History of hiatal hernia     "small one"  . 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 PHN (postherpetic neuralgia)     right side from shingles   Past Surgical History:  Past Surgical History  Procedure Laterality Date  . Cataract extraction w/ intraocular lens  implant, bilateral Bilateral 12/2010-01/2011  . Esophageal dilation      "a few times before 02/2010; not since" (09/08/2013)  . Repair zenker's diverticula  02/2010    "diverticulectomy w/zenker's pouch correction" (09/08/2013)  . Dental surgery  11/2011-12/2011    one on each side in my bottom jaw; dental implants" (09/08/2013)  . Lumbar  fusion  Aug 14, 2013    Dr. Consuello Masse  . Esophageal manometry N/A 02/25/2016    Procedure: ESOPHAGEAL MANOMETRY (EM);  Surgeon: Manus Gunning, MD;  [facility];  Service: Gastroenterology;  Laterality: N/A;  . Esophagogastroduodenoscopy    . Anterior lumbar fusion N/A 06/02/2016    Procedure: Lumbar five-sacral one Anterior lumbar interbody fusion with Dr. Donnetta Hutching for approach;  Surgeon: Erline Levine, MD;  Location: [place] NEURO ORS;  Service: Neurosurgery;  Laterality: N/A;  . Posterior lumbar fusion 4 level Bilateral 06/02/2016    Procedure: Exploration of Fusion Lumbar two-five, Thoracic ten-ileum posterior fusion, Posterolateral arthrodesis with AIRO;   Surgeon: Erline Levine, MD;  Location: [place] NEURO ORS;  Service: Neurosurgery;  Laterality: Bilateral;  . Application of intraoperative ct scan N/A 06/02/2016    Procedure: APPLICATION OF INTRAOPERATIVE CT SCAN;  Surgeon: Erline Levine, MD;  Location: [place] NEURO ORS;  Service: Neurosurgery;  Laterality: N/A;  . Anterior lat lumbar fusion  06/02/2016    Procedure: Thoracic twelve-Lumbar one, Lumbar one-two anterior Lateral Interbody Fusion;  Surgeon: Erline Levine, MD;  Location: [place] NEURO ORS;  Service: Neurosurgery;;  . Abdominal exposure N/A 06/02/2016    Procedure: ABDOMINAL EXPOSURE;  Surgeon: Rosetta Posner, MD;  Location: [facility] NEUR[facility]S;  Service: Vascular;  Laterality: N/A;    Assessment / Plan / Recommendation Clinical Impression  SLP services ordered per pt as she was to receive SLP services as an outpatient, but had to delay secondary to surgery. Pt has a significant history of chronic dysphagia complicated by Sjogren's syndrome and multiple esophageal issues (see above). Pt received a MBSS on 02/17/16 which was remarkable for pharyngeal and esophageal dysphagia characterized by significant pharyngeal residue requiring multiple swallows and liquid washes to clear. At that time pt was recommended to followup with SLP services for pharyngeal exercises targeting the elevation and excursion of the hyolaryngeal complex. Pt is limited however in the exercises she can complete secondary to recently completed surgery. SLP services will be rendered to establish a home exercise program for the patient and establish increased safety with PO following recent surgery. Recommend: regular diet with extra moisture so pt will have the most liberalized food options and can avoid problematic foods with ordering.  Skilled Therapeutic Interventions          Educated pt re: [place] and the [place]. Pt able to complete the [place]- recommended goal of 10 x an hour during waking hours. Specifically discussed that exercises should not  be completed with PO in the oral cavity. Pt unable to complete the [place], but will proceed with ongoing instruction and practice.   SLP Assessment  Patient will need skilled Speech Lanaguage Pathology Services during CIR admission    Recommendations  Recommended Consults:  (seeing GI outpatient) SLP Diet Recommendations: Age appropriate regular solids;Thin (pt to avoid foods she knows are problematic) Liquid Administration via: Cup;Straw Medication Administration: Crushed with puree (or liquid) Supervision: Patient able to self feed;Intermittent supervision to cue for compensatory strategies Compensations: Slow rate;Small sips/bites;Clear throat intermittently;Multiple dry swallows after each bite/sip Postural Changes and/or Swallow Maneuvers: Seated upright 90 degrees;Upright 30-60 min after meal Oral Care Recommendations: Oral care BID Recommendations for Other Services: Neuropsych consult Patient destination: Home Follow up Recommendations: None Equipment Recommended: None recommended by SLP    SLP Frequency 1 to 3 out of 7 days   SLP Duration  SLP Intensity  SLP Treatment/Interventions 7 days of speech tx- likely CIR stay longer for PT/OT needs  Minumum of  1-2 x/day, 30 to 90 minutes  Functional tasks;Dysphagia/aspiration precaution training;Patient/family education;Therapeutic Activities    Pain Pain Assessment Pain Assessment: No/denies pain   Prior Functioning Type of Home: House  Lives With: Alone Available Help at Discharge: Family;Available 24 hours/day Vocation: Retired  Function:  Eating Eating   Modified Consistency Diet: Yes Eating Assist Level: Swallowing techniques: self managed;Set up assist for           Cognition Comprehension Comprehension assist level: Understands complex 90% of the time/cues 10% of the time  Expression   Expression assist level: Expresses complex ideas: With no assist  Social Interaction Social Interaction assist  level: Interacts appropriately with others with medication or extra time (anti-anxiety, antidepressant).  Problem Solving Problem solving assist level: Solves basic problems with no assist  Memory Memory assist level: Recognizes or recalls 90% of the time/requires cueing < 10% of the time   Short Term Goals: Week 1: SLP Short Term Goal 1 (Week 1): LTG=STG  Refer to Care Plan for Long Term Goals  Recommendations for other services: Neuropsych  Discharge Criteria: Patient will be discharged from SLP if patient refuses treatment 3 consecutive times without medical reason, if treatment goals not met, if there is a change in medical status, if patient makes no progress towards goals or if patient is discharged from hospital.  The above assessment, treatment plan, treatment alternatives and goals were discussed and mutually agreed upon: by patient  Vinetta Bergamo MA, CCC-SLP 06/10/2016, 4:40 PM

## 2016-06-11 ENCOUNTER — Inpatient Hospital Stay (HOSPITAL_COMMUNITY): Payer: Medicare HMO

## 2016-06-11 ENCOUNTER — Inpatient Hospital Stay (HOSPITAL_COMMUNITY): Payer: Medicare HMO | Admitting: Occupational Therapy

## 2016-06-11 DIAGNOSIS — R609 Edema, unspecified: Secondary | ICD-10-CM

## 2016-06-11 DIAGNOSIS — N3942 Incontinence without sensory awareness: Secondary | ICD-10-CM

## 2016-06-11 MED ORDER — SENNA 8.6 MG PO TABS
1.0000 | ORAL_TABLET | Freq: Every day | ORAL | Status: DC
  Administered 2016-06-11 – 2016-06-12 (×2): 8.6 mg via ORAL
  Filled 2016-06-11 (×2): qty 1

## 2016-06-11 NOTE — Progress Notes (Signed)
Unable to collect urine sample. Patient had frequent bowel movements today and all voids compromised. Will relate to oncoming RN.

## 2016-06-11 NOTE — Progress Notes (Signed)
70 y.o. right handed female with history of hypertension, polymyositis, rheumatoid arthritis with chronic prednisone,Sjogren's disease, chronic back pain with history of lumbar fusion 2014 L2-L5. Per chart review patient lives alone independent with assistive device prior to admission. One level home with level entry. She has assistance as needed from family. Presented 05/31/2016 with progressive back pain radiating to the lower extremities. X-rays and imaging revealed marked disc degeneration spondylosis, stenosis with radiculopathy. Underwent lumbar 5-sacral 1 anterior lumbar interbody fusion with anterior exposure thoracic 12-lumbar 1, lumbar 1-2 anterior lateral interbody fusion 06/02/2016 per Dr. Vertell Limber as well as Dr. Donnetta Hutching. Hospital course pain management. Cardiology services consulted 06/03/2016 for elevated troponin 0.18 and nonspecific chest pain. Workup suggested atypical chest pain elevated troponin likely due to demand ischemia of surgery and advised to begin aspirin 81 mg daily. Echocardiogram completed with ejection fraction of [id number] grade 1 diastolic dysfunction.  Subjective/Complaints: No issues overnite except incont, some burning yest , no fever Some constipation Objective: Vital Signs: Blood pressure 145/84, pulse 56, temperature 98 F (36.7 C), temperature source Oral, resp. rate 18, height 4\' 10"  (1.473 m), weight 58.741 kg (129 lb 8 oz), SpO2 100 %. No results found. No results found for this or any previous visit (from the past 72 hour(s)).   HEENT: normal Cardio: RRR Resp: CTA B/L GI: BS positive and mild distention and tympany Extremity:  Pulses positive and No Edema Skin:   Intact and Wound abd wound CDI, no drainage, some dried blood on honeycomb dressing Neuro: Alert/Oriented and Abnormal Motor 4+ in BUE 3- BLE Musc/Skel:  Other limited lumbar an thoracic ROM Gen NAD   Assessment/Plan: 1. Functional deficits secondary to Lumbar radiculopathy s/p extension of Lumbar  fusion to include L5-S1 as well as T12-L2 ant and post approach which require 3+ hours per day of interdisciplinary therapy in a comprehensive inpatient rehab setting. Physiatrist is providing close team supervision and 24 hour management of active medical problems listed below. Physiatrist and rehab team continue to assess barriers to discharge/monitor patient progress toward functional and medical goals. FIM: Function - Bathing Position: Sitting EOB Body parts bathed by patient: Right arm, Left arm, Chest, Abdomen, Right upper leg, Left upper leg Body parts bathed by helper: Right lower leg, Left lower leg, Back, Buttocks, Front perineal area Assist Level: Touching or steadying assistance(Pt > 75%)  Function- Upper Body Dressing/Undressing What is the patient wearing?: Pull over shirt/dress Pull over shirt/dress - Perfomed by patient: Thread/unthread right sleeve, Thread/unthread left sleeve Pull over shirt/dress - Perfomed by helper: Put head through opening, Pull shirt over trunk Assist Level: Touching or steadying assistance(Pt > 75%) Function - Lower Body Dressing/Undressing What is the patient wearing?: Pants, Socks, Shoes, Underwear Position: Sitting EOB Underwear - Performed by helper: Thread/unthread right underwear leg, Thread/unthread left underwear leg, Pull underwear up/down Pants- Performed by helper: Thread/unthread right pants leg, Thread/unthread left pants leg, Pull pants up/down Socks - Performed by helper: Don/doff right sock, Don/doff left sock Assist for footwear: Dependant Assist for lower body dressing: Touching or steadying assistance (Pt > 75%)  Function - Toileting Toileting steps completed by helper: Adjust clothing prior to toileting, Performs perineal hygiene, Adjust clothing after toileting Toileting Assistive Devices: Grab bar or rail Assist level: Two helpers  Function - Air cabin crew transfer assistive device: Bedside commode Assist level to  toilet: Maximal assist (Pt 25 - 49%/lift and lower) Assist level from toilet: 2 helpers Assist level to bedside commode (at bedside): Maximal assist (Pt 25 -  49%/lift and lower) Assist level from bedside commode (at bedside): Maximal assist (Pt 25 - 49%/lift and lower)  Function - Chair/bed transfer Chair/bed transfer method: Stand pivot Chair/bed transfer assist level: Moderate assist (Pt 50 - 74%/lift or lower) Chair/bed transfer assistive device: Walker Chair/bed transfer details: Tactile cues for weight shifting, Manual facilitation for weight shifting, Verbal cues for technique  Function - Locomotion: Wheelchair Will patient use wheelchair at discharge?: No Function - Locomotion: Ambulation Assistive device: Walker-rolling Max distance: 100 Assist level: Touching or steadying assistance (Pt > 75%) Assist level: Touching or steadying assistance (Pt > 75%) Assist level: Touching or steadying assistance (Pt > 75%) Walk 150 feet activity did not occur: Safety/medical concerns Walk 10 feet on uneven surfaces activity did not occur: Safety/medical concerns  Function - Comprehension Comprehension: Auditory Comprehension assist level: Understands complex 90% of the time/cues 10% of the time  Function - Expression Expression: Verbal Expression assist level: Expresses complex ideas: With no assist  Function - Social Interaction Social Interaction assist level: Interacts appropriately with others with medication or extra time (anti-anxiety, antidepressant).  Function - Problem Solving Problem solving assist level: Solves basic problems with no assist  Function - Memory Memory assist level: Recognizes or recalls 90% of the time/requires cueing < 10% of the time Patient normally able to recall (first 3 days only): Current season, Location of own room, Staff names and faces, That he or she is in a hospital   Medical Problem List and Plan: 1.  Decreased functional mobility secondary  to lumbar stenosis with radiculopathy. Lumbar corset when out of bed, CIR PT, OT  2.  DVT Prophylaxis/Anticoagulation: SCDs. Check vascular studies upon admit. 3. Pain Management: Percocet and Robaxin as needed. Monitor with increased mobility/therapy             -kpad/ice prn   4. Acute blood loss anemia. Follow-up CBC. Continue iron supplement 5. Neuropsych: This patient is capable of making decisions on her own behalf. 6. Skin/Wound Care: Routine skin checks. Dry dressing/honeycomb to surgical site---persistent inferior serosanguinous drainage.   7. Fluids/Electrolytes/Nutrition: Routine I&O's with follow-up chemistries upon admit. 8. Rheumatoid arthritis/polymyositis. Continue Plaquenil 200 mg daily, prednisone 5 mg daily 9. Hypertension. Lopressor 25 mg twice a day. 10.  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  Urinary inc check UA 12.  Constipation add senna LOS (Days) 2 A FACE TO FACE EVALUATION WAS PERFORMED  Brynnley Dayrit E 06/11/2016, 7:46 AM

## 2016-06-11 NOTE — Progress Notes (Signed)
Occupational Therapy Session Note  Patient Details  Name: [name] MRN: [id number] Date of Birth: 1946/09/21  Today's Date: 06/11/2016 OT Individual Time: 0900-1002 OT Individual Time Calculation (min): 62 min    Short Term Goals: Week 1:  OT Short Term Goal 1 (Week 1): Pt will be SBA with grooming OT Short Term Goal 2 (Week 1): Pt will be bathing using DME as needed with min assist OT Short Term Goal 3 (Week 1): Pt will be dressing with min assist OT Short Term Goal 4 (Week 1): Pt will transfer to 3n1 with min assist OT Short Term Goal 5 (Week 1): Pt will go from sit to stand with mod assist with high seated toilet  Skilled Therapeutic Interventions/Progress Updates:    Pt needed min assist and min instructional cueing for transition from supine to sitting EOB.  Initially, pt not bringing LEs off of bed before attempting to bring trunk up to sitting.  She was able to sit statically EOB with supervision and then complete UB bathing with supervision.  She donned pullover dressing, lumbar corset, and sweater all with supervision.  Did not attempt LB bathing or dressing as she stated nursing helped her change brief and clean peri area earlier and that her legs were not that dirty.  Completed practice with toilet transfers stand pivot to the 3:1 using rollator type walker she has from home.  Pt very fixed in her ways of using her current walker and techniques she has been using at home prior to surgery.  Bed elevated for pt to complete sit to stand with min assist.  Once standing she can ambulate to the [facility] with min assist demonstrating greater lean left and right to assist with clearing LEs during mobility.  Mod assist for sit to stand from the [facility], with min assist to ambulate over to the sink and then back to the EOB.  Pt left sitting EOB secondary to request and not wanting to sit in the wheelchair because it's too hard to get out of and pt stating "I never sit in a wheelchair".  Discussed  with nursing the need for bedside recliner with built up seat and arm rests that she can attempt to use.  Pt's son in room at end of session as well.  Call button and phone in reach.    Therapy Documentation Precautions:  Precautions Precautions: Fall, Back Precaution Booklet Issued: No Precaution Comments: pt able to verbally recall 3/3 precautions. Needs cueing during functional activities. Required Braces or Orthoses: Spinal Brace Spinal Brace: Lumbar corset, Applied in sitting position Restrictions Weight Bearing Restrictions: No  Pain: Pain Assessment Pain Assessment: 0-10 Pain Score: 8  Pain Type: Acute pain;Surgical pain Pain Location: Back Pain Intervention(s): Emotional support;Repositioned ADL: See Function Navigator for Current Functional Status.   Therapy/Group: Individual Therapy  Nyeema Want OTR/L 06/11/2016, 12:34 PM

## 2016-06-11 NOTE — Progress Notes (Signed)
VASCULAR LAB PRELIMINARY  PRELIMINARY  PRELIMINARY  PRELIMINARY  Bilateral lower extremity venous duplex has been completed.    Bilateral:  No evidence of DVT, superficial thrombosis, or Baker's Cyst.  Gave results to patient's nurses.  Sye Schroepfer, RVT, RDMS 06/11/2016, 11:03 AM

## 2016-06-12 ENCOUNTER — Inpatient Hospital Stay (HOSPITAL_COMMUNITY): Payer: Medicare HMO | Admitting: Speech Pathology

## 2016-06-12 ENCOUNTER — Inpatient Hospital Stay (HOSPITAL_COMMUNITY): Payer: Medicare HMO

## 2016-06-12 ENCOUNTER — Inpatient Hospital Stay (HOSPITAL_COMMUNITY): Payer: Medicare HMO | Admitting: Occupational Therapy

## 2016-06-12 DIAGNOSIS — G8220 Paraplegia, unspecified: Secondary | ICD-10-CM

## 2016-06-12 LAB — CBC WITH DIFFERENTIAL/PLATELET
BASOS ABS: 0 10*3/uL (ref 0.0–0.1)
Basophils Relative: 0 %
EOS ABS: 0.2 10*3/uL (ref 0.0–0.7)
EOS PCT: 4 %
HCT: 28.1 % — ABNORMAL LOW (ref 36.0–46.0)
Hemoglobin: 9.2 g/dL — ABNORMAL LOW (ref 12.0–15.0)
LYMPHS PCT: 39 %
Lymphs Abs: 2.2 10*3/uL (ref 0.7–4.0)
MCH: 28.3 pg (ref 26.0–34.0)
MCHC: 32.7 g/dL (ref 30.0–36.0)
MCV: 86.5 fL (ref 78.0–100.0)
Monocytes Absolute: 0.8 10*3/uL (ref 0.1–1.0)
Monocytes Relative: 14 %
Neutro Abs: 2.4 10*3/uL (ref 1.7–7.7)
Neutrophils Relative %: 43 %
PLATELETS: 375 10*3/uL (ref 150–400)
RBC: 3.25 MIL/uL — AB (ref 3.87–5.11)
RDW: 15.5 % (ref 11.5–15.5)
WBC: 5.6 10*3/uL (ref 4.0–10.5)

## 2016-06-12 LAB — URINE MICROSCOPIC-ADD ON: RBC / HPF: NONE SEEN RBC/hpf (ref 0–5)

## 2016-06-12 LAB — COMPREHENSIVE METABOLIC PANEL
ALBUMIN: 2.2 g/dL — AB (ref 3.5–5.0)
ALT: 14 U/L (ref 14–54)
AST: 23 U/L (ref 15–41)
Alkaline Phosphatase: 79 U/L (ref 38–126)
Anion gap: 7 (ref 5–15)
BUN: 6 mg/dL (ref 6–20)
CHLORIDE: 98 mmol/L — AB (ref 101–111)
CO2: 29 mmol/L (ref 22–32)
CREATININE: 0.49 mg/dL (ref 0.44–1.00)
Calcium: 8.7 mg/dL — ABNORMAL LOW (ref 8.9–10.3)
GFR calc Af Amer: >60 mL/min (ref >60)
GFR calc non Af Amer: >60 mL/min (ref >60)
GLUCOSE: 80 mg/dL (ref 65–99)
Potassium: 3.3 mmol/L — ABNORMAL LOW (ref 3.5–5.1)
SODIUM: 134 mmol/L — AB (ref 135–145)
Total Bilirubin: 0.4 mg/dL (ref 0.3–1.2)
Total Protein: 5 g/dL — ABNORMAL LOW (ref 6.5–8.1)

## 2016-06-12 LAB — URINALYSIS, ROUTINE W REFLEX MICROSCOPIC
Bilirubin Urine: NEGATIVE
Glucose, UA: NEGATIVE mg/dL
HGB URINE DIPSTICK: NEGATIVE
Ketones, ur: NEGATIVE mg/dL
Nitrite: NEGATIVE
PROTEIN: NEGATIVE mg/dL
Specific Gravity, Urine: 1.018 (ref 1.005–1.030)
pH: 7 (ref 5.0–8.0)

## 2016-06-12 MED ORDER — POTASSIUM CHLORIDE CRYS ER 20 MEQ PO TBCR
20.0000 meq | EXTENDED_RELEASE_TABLET | Freq: Two times a day (BID) | ORAL | Status: DC
  Administered 2016-06-12 – 2016-06-13 (×3): 20 meq via ORAL
  Filled 2016-06-12 (×3): qty 1

## 2016-06-12 NOTE — Progress Notes (Signed)
Subjective/Complaints: Some muscle pain yesterday , up sitting most of the day Some constipation Objective: Vital Signs: Blood pressure 142/61, pulse 62, temperature 97.8 F (36.6 C), temperature source Oral, resp. rate 17, height 4\' 10"  (1.473 m), weight 58.741 kg (129 lb 8 oz), SpO2 100 %. No results found. Results for orders placed or performed during the hospital encounter of 06/09/16 (from the past 72 hour(s))  Urinalysis, Routine w reflex microscopic (not at [facility])     Status: Abnormal   Collection Time: 06/11/16 11:36 PM  Result Value Ref Range   Color, Urine YELLOW YELLOW   APPearance CLOUDY (A) CLEAR   Specific Gravity, Urine 1.018 1.005 - 1.030   pH 7.0 5.0 - 8.0   Glucose, UA NEGATIVE NEGATIVE mg/dL   Hgb urine dipstick NEGATIVE NEGATIVE   Bilirubin Urine NEGATIVE NEGATIVE   Ketones, ur NEGATIVE NEGATIVE mg/dL   Protein, ur NEGATIVE NEGATIVE mg/dL   Nitrite NEGATIVE NEGATIVE   Leukocytes, UA SMALL (A) NEGATIVE  Urine microscopic-add on     Status: Abnormal   Collection Time: 06/11/16 11:36 PM  Result Value Ref Range   Squamous Epithelial / LPF 0-5 (A) NONE SEEN   WBC, UA 6-30 0 - 5 WBC/hpf   RBC / HPF NONE SEEN 0 - 5 RBC/hpf   Bacteria, UA RARE (A) NONE SEEN   Casts HYALINE CASTS (A) NEGATIVE  CBC WITH DIFFERENTIAL     Status: Abnormal   Collection Time: 06/12/16  6:12 AM  Result Value Ref Range   WBC 5.6 4.0 - 10.5 K/uL   RBC 3.25 (L) 3.87 - 5.11 MIL/uL   Hemoglobin 9.2 (L) 12.0 - 15.0 g/dL   HCT 28.1 (L) 36.0 - 46.0 %   MCV 86.5 78.0 - 100.0 fL   MCH 28.3 26.0 - 34.0 pg   MCHC 32.7 30.0 - 36.0 g/dL   RDW 15.5 11.5 - 15.5 %   Platelets 375 150 - 400 K/uL   Neutrophils Relative % 43 %   Neutro Abs 2.4 1.7 - 7.7 K/uL   Lymphocytes Relative 39 %   Lymphs Abs 2.2 0.7 - 4.0 K/uL   Monocytes Relative 14 %   Monocytes Absolute 0.8 0.1 - 1.0 K/uL   Eosinophils Relative 4 %   Eosinophils Absolute 0.2 0.0 - 0.7 K/uL   Basophils Relative 0 %   Basophils  Absolute 0.0 0.0 - 0.1 K/uL     HEENT: normal Cardio: RRR Resp: CTA B/L GI: BS positive and mild distention and tympany Extremity:  Pulses positive and No Edema Skin:   Intact and Wound abd wound CDI, no drainage, some dried blood on honeycomb dressing Neuro: Alert/Oriented and Abnormal Motor 4+ in BUE 3- BLE Musc/Skel:  Other limited lumbar an thoracic ROM Gen NAD   Assessment/Plan: 1. Functional deficits secondary to Lumbar radiculopathy s/p extension of Lumbar fusion to include L5-S1 as well as T12-L2 ant and post approach which require 3+ hours per day of interdisciplinary therapy in a comprehensive inpatient rehab setting. Physiatrist is providing close team supervision and 24 hour management of active medical problems listed below. Physiatrist and rehab team continue to assess barriers to discharge/monitor patient progress toward functional and medical goals. FIM: Function - Bathing Position: Sitting EOB Body parts bathed by patient: Right arm, Left arm, Chest, Abdomen Body parts bathed by helper: Back Bathing not applicable: Front perineal area, Buttocks, Right upper leg, Left upper leg, Right lower leg, Left lower leg (Did not attempt secondary to pt felt she  didn't need to wash them today.) Assist Level: Supervision or verbal cues  Function- Upper Body Dressing/Undressing What is the patient wearing?: Pull over shirt/dress, Bra Bra - Perfomed by patient: Thread/unthread right bra strap, Thread/unthread left bra strap, Hook/unhook bra (pull down sports bra) Pull over shirt/dress - Perfomed by patient: Thread/unthread right sleeve, Thread/unthread left sleeve, Put head through opening, Pull shirt over trunk Pull over shirt/dress - Perfomed by helper: Put head through opening, Pull shirt over trunk Assist Level: Supervision or verbal cues Function - Lower Body Dressing/Undressing What is the patient wearing?: Non-skid slipper socks Position: Sitting EOB Underwear - Performed  by helper: Thread/unthread right underwear leg, Thread/unthread left underwear leg, Pull underwear up/down Pants- Performed by helper: Thread/unthread right pants leg, Thread/unthread left pants leg, Pull pants up/down Non-skid slipper socks- Performed by helper: Don/doff right sock, Don/doff left sock Socks - Performed by helper: Don/doff right sock, Don/doff left sock Assist for footwear: Dependant Assist for lower body dressing: Touching or steadying assistance (Pt > 75%)  Function - Toileting Toileting steps completed by helper: Adjust clothing prior to toileting, Performs perineal hygiene, Adjust clothing after toileting Toileting Assistive Devices: Grab bar or rail Assist level: Two helpers  Function - Air cabin crew transfer assistive device: Bedside commode Assist level to toilet: Moderate assist (Pt 50 - 74%/lift or lower) Assist level from toilet: Moderate assist (Pt 50 - 74%/lift or lower) Assist level to bedside commode (at bedside): Maximal assist (Pt 25 - 49%/lift and lower) Assist level from bedside commode (at bedside): Maximal assist (Pt 25 - 49%/lift and lower)  Function - Chair/bed transfer Chair/bed transfer method: Stand pivot Chair/bed transfer assist level: Moderate assist (Pt 50 - 74%/lift or lower) Chair/bed transfer assistive device: Walker Chair/bed transfer details: Tactile cues for weight shifting, Manual facilitation for weight shifting, Verbal cues for technique  Function - Locomotion: Wheelchair Will patient use wheelchair at discharge?: No Function - Locomotion: Ambulation Assistive device: Walker-rolling Max distance: 100 Assist level: Touching or steadying assistance (Pt > 75%) Assist level: Touching or steadying assistance (Pt > 75%) Assist level: Touching or steadying assistance (Pt > 75%) Walk 150 feet activity did not occur: Safety/medical concerns Walk 10 feet on uneven surfaces activity did not occur: Safety/medical  concerns  Function - Comprehension Comprehension: Auditory Comprehension assist level: Understands complex 90% of the time/cues 10% of the time  Function - Expression Expression: Verbal Expression assist level: Expresses complex ideas: With no assist  Function - Social Interaction Social Interaction assist level: Interacts appropriately with others with medication or extra time (anti-anxiety, antidepressant).  Function - Problem Solving Problem solving assist level: Solves basic problems with no assist  Function - Memory Memory assist level: Recognizes or recalls 90% of the time/requires cueing < 10% of the time Patient normally able to recall (first 3 days only): Current season, Location of own room, Staff names and faces, That he or she is in a hospital   Medical Problem List and Plan: 1.  Decreased functional mobility secondary to lumbar stenosis with radiculopathy. Lumbar corset when out of bed, CIR PT, OT  2.  DVT Prophylaxis/Anticoagulation: SCDs. Check vascular studies upon admit.Doppler negative 3. Pain Management: Percocet and Robaxin as needed. Monitor with increased mobility/therapy             -kpad/ice prn   4. Acute blood loss anemia. Follow-up CBC. Continue iron supplement, Hgb 9.2, down from 10.1 a week ago, will monitor, pt reluctant to take Fe due to constipation will encourage 5.  Neuropsych: This patient is capable of making decisions on her own behalf. 6. Skin/Wound Care: Routine skin checks. Dry dressing/honeycomb to surgical site---persistent inferior serosanguinous drainage.   7. Fluids/Electrolytes/Nutrition: Routine I&O's with follow-up chemistries upon admit.BMET normal 8. Rheumatoid arthritis/polymyositis. Continue Plaquenil 200 mg daily, prednisone 5 mg daily 9. Hypertension. Lopressor 25 mg twice a day. 10.  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  Urinary inc  UA negative 12.  Constipation add  senna LOS (Days) 3 A FACE TO FACE EVALUATION WAS PERFORMED  Hydie Langan E 06/12/2016, 7:15 AM

## 2016-06-12 NOTE — Progress Notes (Signed)
Occupational Therapy Session Note  Patient Details  Name: [name] MRN: [id number] Date of Birth: [birth year]  Today's Date: 06/12/2016 OT Individual Time: 0805-0920 OT Individual Time Calculation (min): 75 min   Short Term Goals: Week 1:  OT Short Term Goal 1 (Week 1): Pt will be SBA with grooming OT Short Term Goal 2 (Week 1): Pt will be bathing using DME as needed with min assist OT Short Term Goal 3 (Week 1): Pt will be dressing with min assist OT Short Term Goal 4 (Week 1): Pt will transfer to 3n1 with min assist OT Short Term Goal 5 (Week 1): Pt will go from sit to stand with mod assist with high seated toilet  Skilled Therapeutic Interventions/Progress Updates:  Patient found seated EOB eating breakfast with family present. Pt with no direct complaints of pain during session. RN present for medication management and pt particular about how medications are given, assisted pt with writing down what medications she wants to take and how she wants to take them to give them to her nurse for future reference. Therapist talked with patient about her goals and OT, pt with poor insight into why OT is important during this rehab stay. Therapist took opportunity to educate pt on OT and importance working on BADLs to increase independence and safety so she can go home alone. Family left once ADL session started. Pt worked on Air traffic controller. Pt took increased time and needed to complete tasks her own speed and own way, therapist educated pt on importance of adhering to back precautions during all tasks, including ADL. Pt completed LB bathing (peri area) & dressing, refusing UB bathing & dressing. At end of session, left pt seated EOB per her preference with all needs within reach. .   Therapy Documentation Precautions:  Precautions Precautions: Fall, Back Precaution Booklet Issued: No Precaution Comments: pt able to verbally recall 3/3 precautions. Needs cueing during functional  activities. Required Braces or Orthoses: Spinal Brace Spinal Brace: Lumbar corset, Applied in sitting position Restrictions Weight Bearing Restrictions: No  Vital Signs: Therapy Vitals Temp: 97.8 F (36.6 C) Temp Source: Oral Pulse Rate: 62 Resp: 17 BP: (!) 142/61 mmHg Patient Position (if appropriate): Lying Oxygen Therapy SpO2: 100 % O2 Device: Not Delivered  See Function Navigator for Current Functional Status.  Therapy/Group: Individual Therapy  Chrys Racer , MS, OTR/L, CLT  06/12/2016, 9:48 AM

## 2016-06-12 NOTE — IPOC Note (Signed)
Overall Plan of Care [facility]) Patient Details Name: [name] MRN: [id number] DOB: Feb 15, 1946  Admitting Diagnosis: Lumbar Fusion  Hospital Problems: Active Problems:   Radiculopathy     Functional Problem List: Nursing Bladder  PT Balance, Endurance, Motor, Pain, Safety  OT Balance, Endurance  SLP Nutrition  TR         Basic ADL's: OT Grooming, Bathing, Dressing, Toileting     Advanced  ADL's: OT Simple Meal Preparation     Transfers: PT Bed Mobility, Bed to Chair, Car  OT Toilet, Tub/Shower     Locomotion: PT Ambulation, Stairs     Additional Impairments: OT    SLP Swallowing      TR      Anticipated Outcomes Item Anticipated Outcome  Self Feeding    Swallowing  mod I   Basic self-care  mod I  Toileting  mod I   Bathroom Transfers mod for toileting  Bowel/Bladder  continent of bowel and bladder. last BM 6/22 per rn report. patient also reports having difficulty with bowel movent past couple of days.  Transfers  Mod I   Locomotion  Mod I  Communication     Cognition     Pain  pain less than or equal to 2  Safety/Judgment  supervision for transfer   Therapy Plan: PT Intensity: Minimum of 1-2 x/day ,45 to 90 minutes PT Frequency: 5 out of 7 days PT Duration Estimated Length of Stay: 12-14 days OT Intensity: Minimum of 1-2 x/day, 45 to 90 minutes OT Frequency: 5 out of 7 days OT Duration/Estimated Length of Stay: 12-14 days SLP Intensity: Minumum of 1-2 x/day, 30 to 90 minutes SLP Frequency: 1 to 3 out of 7 days SLP Duration/Estimated Length of Stay: 7 days of speech tx- likely CIR stay longer for PT/OT needs       Team Interventions: Nursing Interventions Bladder Management  PT interventions Ambulation/gait training, Pain management, Stair training, Therapeutic Exercise, Therapeutic Activities, UE/LE Strength taining/ROM, UE/LE Coordination activities, Functional mobility training  OT Interventions Balance/vestibular training,  Community reintegration, Disease mangement/prevention, Functional mobility training  SLP Interventions Functional tasks, Dysphagia/aspiration precaution training, Patient/family education, Therapeutic Activities  TR Interventions    SW/CM Interventions Discharge Planning, Psychosocial Support, Patient/Family Education    Team Discharge Planning: Destination: PT-Home ,OT- Home , SLP-Home Projected Follow-up: PT-Home health PT, OT-  Home health OT, SLP-None Projected Equipment Needs: PT-To be determined, OT- Tub/shower bench, SLP-None recommended by SLP Equipment Details: PT- , OT-  Patient/family involved in discharge planning: PT- Patient, Family member/caregiver,  OT-Patient, SLP-Patient  MD ELOS: 8-11d Medical Rehab Prognosis:  Good Assessment: 70 y.o. right handed female with history of hypertension, polymyositis, rheumatoid arthritis with chronic prednisone,Sjogren's disease, chronic back pain with history of lumbar fusion 2014 L2-L5. Per chart review patient lives alone independent with assistive device prior to admission. One level home with level entry. She has assistance as needed from family. Presented 05/31/2016 with progressive back pain radiating to the lower extremities. X-rays and imaging revealed marked disc degeneration spondylosis, stenosis with radiculopathy. Underwent lumbar 5-sacral 1 anterior lumbar interbody fusion with anterior exposure thoracic 12-lumbar 1, lumbar 1-2 anterior lateral interbody fusion 06/02/2016 per Dr. Vertell Limber as well as Dr. Donnetta Hutching. Hospital course pain management. Cardiology services consulted 06/03/2016 for elevated troponin 0.18 and nonspecific chest pain. Workup suggested atypical chest pain elevated troponin likely due to demand ischemia of surgery and advised to begin aspirin 81 mg daily. Echocardiogram completed with ejection fraction of [id number] grade 1 diastolic  dysfunction. No wall motion abnormalities. . Acute blood loss anemia 7.4 transfused latest  hemoglobin 10.1   Now requiring 24/7 Rehab RN,MD, as well as CIR level PT, OT, SLP Treatment team will focus on ADLs and mobility, Will also work on food textures and swallowing techniques with goals set at Modified independent See Team Conference Notes for weekly updates to the plan of care

## 2016-06-12 NOTE — Progress Notes (Signed)
Pt refuses to take some of her scheduled meds--vitamin D, iron, and plaquenil-- please advise and discontinue from profile if pt will not take.

## 2016-06-12 NOTE — Progress Notes (Signed)
Physical Therapy Session Note  Patient Details  Name: [name] MRN: [id number] Date of Birth: March 27, 1946  Today's Date: 06/12/2016 PT Individual Time: 1400-1500 PT Individual Time Calculation (min): 60 min   Short Term Goals: Week 1:  PT Short Term Goal 1 (Week 1): Patient will be able to perform sit to stand from regular height bed with CGA.  PT Short Term Goal 2 (Week 1): Patient will increase B Quad strength to 3+/5 in order to improve transfers PT Short Term Goal 3 (Week 1): Patient will be able to ambulate with RW on a distance of 200 feet with CGA and improved step length and velocity  Skilled Therapeutic Interventions/Progress Updates:    Session focused on functional sit <> stands, gait training, trial with RW instead of her personal rollator, and education with pt and son in regards to home set-up and management. Pt requires significant amount of extra time for all tasks and requires education on why things may need to be approached a different way after surgery. Pt required min to mod assist for transfers - requesting to have bed height adjusted accordingly and educated on needing to find a height for her bed at home that will work for both sit<> stand. Pt in agreement. Trial with RW for more stability during gait due to heavy lateral sway with use of rollator. Pt overall min assist for balance with abnormal gait pattern noted with stiff legs, decreased weightbearing to RLE, and minimal hip and knee flexion. PT educated and redonned LSO for improved fit. End of session left seated EOB with son present in room.   Therapy Documentation Precautions:  Precautions Precautions: Fall, Back Precaution Booklet Issued: No Precaution Comments: pt able to verbally recall 3/3 precautions. Needs cueing during functional activities. Required Braces or Orthoses: Spinal Brace Spinal Brace: Lumbar corset, Applied in sitting position Restrictions Weight Bearing Restrictions:  No  Pain: Premedicated - reports R hip/neuralgia pain.       See Function Navigator for Current Functional Status.   Therapy/Group: Individual Therapy  Canary Brim Ivory Broad, PT, DPT  06/12/2016, 3:37 PM

## 2016-06-12 NOTE — Progress Notes (Signed)
Speech Language Pathology Daily Session Note  Patient Details  Name: [name] MRN: [id number] Date of Birth: [birth year]  Today's Date: 06/12/2016 SLP Individual Time: 1300-1400 SLP Individual Time Calculation (min): 60 min  Short Term Goals: Week 1: SLP Short Term Goal 1 (Week 1): LTG=STG  Skilled Therapeutic Interventions: Skilled treatment session focused on dysphagia therapy and establishment of home problem for management of esophageal deficits. Pt able to return demonstration of knowledge regarding current swallow ability and esophageal function. Picture diagram of esophagus reviewed and labeled with pt. Compensatory strategies reviewed such as liquid wash, small frequent meals, soft consistencies, and remaining upright after meals. Pt able to describe home routine including occasionally crushing medicine. Pt required Mod A verbal and tactile cues for Masako exercises and Max A verbal and tactile cues for Mendelson. Pt able to demonstrate Masako but unable to perform Mendelson. Will review at next available therapy session. Son present during session and verbalized understanding of information. Pt left sitting upright at edge of bed, son present and all needs within reach. Continue current plan of care.   Function:  Eating Eating   Modified Consistency Diet: No Eating Assist Level: Swallowing techniques: self managed           Cognition Comprehension Comprehension assist level: Understands complex 90% of the time/cues 10% of the time  Expression   Expression assist level: Expresses complex ideas: With no assist  Social Interaction Social Interaction assist level: Interacts appropriately with others with medication or extra time (anti-anxiety, antidepressant).  Problem Solving Problem solving assist level: Solves basic problems with no assist  Memory Memory assist level: Recognizes or recalls 90% of the time/requires cueing < 10% of the time    Pain Pain Assessment Pain  Assessment: 0-10 Pain Score: 3  Pain Type: Acute pain;Chronic pain Pain Location: Back Pain Descriptors / Indicators: Aching Pain Frequency: Constant Pain Onset: On-going Patients Stated Pain Goal: 3 Pain Intervention(s): Medication (See eMAR)  Therapy/Group: Individual Therapy  Ben Habermann 06/12/2016, 2:19 PM

## 2016-06-13 ENCOUNTER — Inpatient Hospital Stay (HOSPITAL_COMMUNITY): Payer: Medicare HMO

## 2016-06-13 ENCOUNTER — Inpatient Hospital Stay (HOSPITAL_COMMUNITY): Payer: Medicare HMO | Admitting: Speech Pathology

## 2016-06-13 ENCOUNTER — Inpatient Hospital Stay (HOSPITAL_COMMUNITY): Payer: Medicare HMO | Admitting: Occupational Therapy

## 2016-06-13 DIAGNOSIS — E876 Hypokalemia: Secondary | ICD-10-CM

## 2016-06-13 LAB — URINE CULTURE

## 2016-06-13 MED ORDER — POLYSACCHARIDE IRON COMPLEX 150 MG PO CAPS
150.0000 mg | ORAL_CAPSULE | Freq: Every day | ORAL | Status: DC
  Administered 2016-06-13 – 2016-06-21 (×9): 150 mg via ORAL
  Filled 2016-06-13 (×11): qty 1

## 2016-06-13 MED ORDER — SENNA 8.6 MG PO TABS
2.0000 | ORAL_TABLET | Freq: Every day | ORAL | Status: DC
  Administered 2016-06-13 – 2016-06-15 (×3): 17.2 mg via ORAL
  Filled 2016-06-13 (×3): qty 2

## 2016-06-13 MED ORDER — POTASSIUM CHLORIDE 20 MEQ/15ML (10%) PO SOLN
20.0000 meq | Freq: Once | ORAL | Status: AC
  Administered 2016-06-13: 20 meq via ORAL
  Filled 2016-06-13: qty 15

## 2016-06-13 NOTE — Progress Notes (Signed)
Subjective/Complaints: Yellowish drainage from incision.  No increased pain, discussed bowels and LE swelling Some constipation Objective: Vital Signs: Blood pressure 133/56, pulse 54, temperature 97.9 F (36.6 C), temperature source Oral, resp. rate 16, height '4\' 10"'  (1.473 m), weight 58.741 kg (129 lb 8 oz), SpO2 99 %. No results found. Results for orders placed or performed during the hospital encounter of 06/09/16 (from the past 72 hour(s))  Urinalysis, Routine w reflex microscopic (not at [facility])     Status: Abnormal   Collection Time: 06/11/16 11:36 PM  Result Value Ref Range   Color, Urine YELLOW YELLOW   APPearance CLOUDY (A) CLEAR   Specific Gravity, Urine 1.018 1.005 - 1.030   pH 7.0 5.0 - 8.0   Glucose, UA NEGATIVE NEGATIVE mg/dL   Hgb urine dipstick NEGATIVE NEGATIVE   Bilirubin Urine NEGATIVE NEGATIVE   Ketones, ur NEGATIVE NEGATIVE mg/dL   Protein, ur NEGATIVE NEGATIVE mg/dL   Nitrite NEGATIVE NEGATIVE   Leukocytes, UA SMALL (A) NEGATIVE  Urine culture     Status: Abnormal   Collection Time: 06/11/16 11:36 PM  Result Value Ref Range   Specimen Description URINE, CLEAN CATCH    Special Requests NONE    Culture MULTIPLE SPECIES PRESENT, SUGGEST RECOLLECTION (A)    Report Status 06/13/2016 FINAL   Urine microscopic-add on     Status: Abnormal   Collection Time: 06/11/16 11:36 PM  Result Value Ref Range   Squamous Epithelial / LPF 0-5 (A) NONE SEEN   WBC, UA 6-30 0 - 5 WBC/hpf   RBC / HPF NONE SEEN 0 - 5 RBC/hpf   Bacteria, UA RARE (A) NONE SEEN   Casts HYALINE CASTS (A) NEGATIVE  CBC WITH DIFFERENTIAL     Status: Abnormal   Collection Time: 06/12/16  6:12 AM  Result Value Ref Range   WBC 5.6 4.0 - 10.5 K/uL   RBC 3.25 (L) 3.87 - 5.11 MIL/uL   Hemoglobin 9.2 (L) 12.0 - 15.0 g/dL   HCT 28.1 (L) 36.0 - 46.0 %   MCV 86.5 78.0 - 100.0 fL   MCH 28.3 26.0 - 34.0 pg   MCHC 32.7 30.0 - 36.0 g/dL   RDW 15.5 11.5 - 15.5 %   Platelets 375 150 - 400 K/uL   Neutrophils Relative % 43 %   Neutro Abs 2.4 1.7 - 7.7 K/uL   Lymphocytes Relative 39 %   Lymphs Abs 2.2 0.7 - 4.0 K/uL   Monocytes Relative 14 %   Monocytes Absolute 0.8 0.1 - 1.0 K/uL   Eosinophils Relative 4 %   Eosinophils Absolute 0.2 0.0 - 0.7 K/uL   Basophils Relative 0 %   Basophils Absolute 0.0 0.0 - 0.1 K/uL  Comprehensive metabolic panel     Status: Abnormal   Collection Time: 06/12/16  6:12 AM  Result Value Ref Range   Sodium 134 (L) 135 - 145 mmol/L   Potassium 3.3 (L) 3.5 - 5.1 mmol/L   Chloride 98 (L) 101 - 111 mmol/L   CO2 29 22 - 32 mmol/L   Glucose, Bld 80 65 - 99 mg/dL   BUN 6 6 - 20 mg/dL   Creatinine, Ser 0.49 0.44 - 1.00 mg/dL   Calcium 8.7 (L) 8.9 - 10.3 mg/dL   Total Protein 5.0 (L) 6.5 - 8.1 g/dL   Albumin 2.2 (L) 3.5 - 5.0 g/dL   AST 23 15 - 41 U/L   ALT 14 14 - 54 U/L   Alkaline Phosphatase 79 38 -  126 U/L   Total Bilirubin 0.4 0.3 - 1.2 mg/dL   GFR calc non Af Amer >60 >60 mL/min   GFR calc Af Amer >60 >60 mL/min    Comment: (NOTE) The eGFR has been calculated using the CKD EPI equation. This calculation has not been validated in all clinical situations. eGFR's persistently <60 mL/min signify possible Chronic Kidney Disease.    Anion gap 7 5 - 15     HEENT: normal Cardio: RRR Resp: CTA B/L GI: BS positive and mild distention and tympany Extremity:  Pulses positive and No Edema Skin:   Intact and Wound abd wound CDI, no drainage, some dried blood on honeycomb dressing Neuro: Alert/Oriented and Abnormal Motor 4+ in BUE 3- BLE Musc/Skel:  Other limited lumbar an thoracic ROM Gen NAD   Assessment/Plan: 1. Functional deficits secondary to Lumbar radiculopathy s/p extension of Lumbar fusion to include L5-S1 as well as T12-L2 ant and post approach which require 3+ hours per day of interdisciplinary therapy in a comprehensive inpatient rehab setting. Physiatrist is providing close team supervision and 24 hour management of active medical problems  listed below. Physiatrist and rehab team continue to assess barriers to discharge/monitor patient progress toward functional and medical goals. FIM: Function - Bathing Position: Sitting EOB Body parts bathed by patient: Right arm, Left arm, Chest, Abdomen Body parts bathed by helper: Back Bathing not applicable: Front perineal area, Buttocks, Right upper leg, Left upper leg, Right lower leg, Left lower leg (Did not attempt secondary to pt felt she didn't need to wash them today.) Assist Level: Supervision or verbal cues  Function- Upper Body Dressing/Undressing What is the patient wearing?: Pull over shirt/dress, Bra Bra - Perfomed by patient: Thread/unthread right bra strap, Thread/unthread left bra strap, Hook/unhook bra (pull down sports bra) Pull over shirt/dress - Perfomed by patient: Thread/unthread right sleeve, Thread/unthread left sleeve, Put head through opening, Pull shirt over trunk Pull over shirt/dress - Perfomed by helper: Put head through opening, Pull shirt over trunk Assist Level: Supervision or verbal cues Function - Lower Body Dressing/Undressing What is the patient wearing?: Non-skid slipper socks Position: Sitting EOB Underwear - Performed by helper: Thread/unthread right underwear leg, Thread/unthread left underwear leg, Pull underwear up/down Pants- Performed by helper: Thread/unthread right pants leg, Thread/unthread left pants leg, Pull pants up/down Non-skid slipper socks- Performed by helper: Don/doff right sock, Don/doff left sock Socks - Performed by helper: Don/doff right sock, Don/doff left sock Assist for footwear: Dependant Assist for lower body dressing:  (helper completed  all, total assist)  Function - Toileting Toileting steps completed by patient: Adjust clothing prior to toileting Toileting steps completed by helper: Adjust clothing prior to toileting, Performs perineal hygiene, Adjust clothing after toileting Toileting Assistive Devices: Grab bar  or rail Assist level: Two helpers  Function - Air cabin crew transfer assistive device: Bedside commode Assist level to toilet: Moderate assist (Pt 50 - 74%/lift or lower) Assist level from toilet: Moderate assist (Pt 50 - 74%/lift or lower) Assist level to bedside commode (at bedside): Maximal assist (Pt 25 - 49%/lift and lower) Assist level from bedside commode (at bedside): Maximal assist (Pt 25 - 49%/lift and lower)  Function - Chair/bed transfer Chair/bed transfer method: Ambulatory Chair/bed transfer assist level: Moderate assist (Pt 50 - 74%/lift or lower) Chair/bed transfer assistive device: Walker Chair/bed transfer details: Tactile cues for weight shifting, Manual facilitation for weight shifting, Verbal cues for technique  Function - Locomotion: Wheelchair Will patient use wheelchair at discharge?: No Function -  Locomotion: Ambulation Assistive device: Walker-rolling, Other (comment) (rollator) Max distance: 140' Assist level: Touching or steadying assistance (Pt > 75%) Assist level: Touching or steadying assistance (Pt > 75%) Assist level: Touching or steadying assistance (Pt > 75%) Walk 150 feet activity did not occur: Safety/medical concerns Walk 10 feet on uneven surfaces activity did not occur: Safety/medical concerns  Function - Comprehension Comprehension: Auditory Comprehension assist level: Understands complex 90% of the time/cues 10% of the time  Function - Expression Expression: Verbal Expression assist level: Expresses complex ideas: With no assist  Function - Social Interaction Social Interaction assist level: Interacts appropriately with others with medication or extra time (anti-anxiety, antidepressant).  Function - Problem Solving Problem solving assist level: Solves basic problems with no assist  Function - Memory Memory assist level: Recognizes or recalls 90% of the time/requires cueing < 10% of the time Patient normally able to recall  (first 3 days only): Current season, Location of own room, Staff names and faces, That he or she is in a hospital   Medical Problem List and Plan: 1.  Decreased functional mobility secondary to lumbar stenosis with radiculopathy. Lumbar corset when out of bed, CIR PT, OT  2.  DVT Prophylaxis/Anticoagulation: SCDs. Check vascular studies upon admit.Doppler negative, LE swelling multifactorial venous insuff, paraparesis, add THight hi TEDs 3. Pain Management: Percocet and Robaxin as needed. Monitor with increased mobility/therapy             -kpad/ice prn   4. Acute blood loss anemia. Follow-up CBC. Continue iron supplement, Hgb 9.2, down from 10.1 a week ago, will monitor, pt reluctant to take Fe , wants Nu iron 5. Neuropsych: This patient is capable of making decisions on her own behalf. 6. Skin/Wound Care: Routine skin checks. Dry dressing/honeycomb to surgical site---persistent inferior serosanguinous drainage.   7. Fluids/Electrolytes/Nutrition: Routine I&O's with follow-up chemistries upon admit.BMET normal 8. Rheumatoid arthritis/polymyositis. Continue Plaquenil 200 mg daily, prednisone 5 mg daily 9. Hypertension. Lopressor 25 mg twice a day. 10.  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  Urinary inc  UA negative 12.  Constipation add senna 13.  HypoK on supplement LOS (Days) 4 A FACE TO FACE EVALUATION WAS PERFORMED  KIRSTEINS,ANDREW E 06/13/2016, 7:50 AM

## 2016-06-13 NOTE — Progress Notes (Signed)
Occupational Therapy Session Note  Patient Details  Name: [name] MRN: [id number] Date of Birth: June 13, 1946  Today's Date: 06/13/2016 OT Individual Time: 1030-1135 OT Individual Time Calculation (min): 65 min    Short Term Goals: Week 1:  OT Short Term Goal 1 (Week 1): Pt will be SBA with grooming OT Short Term Goal 2 (Week 1): Pt will be bathing using DME as needed with min assist OT Short Term Goal 3 (Week 1): Pt will be dressing with min assist OT Short Term Goal 4 (Week 1): Pt will transfer to 3n1 with min assist OT Short Term Goal 5 (Week 1): Pt will go from sit to stand with mod assist with high seated toilet  Skilled Therapeutic Interventions/Progress Updates:    Pt seen for OT session focusing on functional transfers and caregiver training. Pt sitting EOB upon arrival, agreeable to tx session. Pt desiring to have son, [name], checked off to assist her to [facility]. Completed stand pivot with use of RW to [facility] x 2 trials with therapist assisting and then son assisting. Pt able to stand from highly elevated EOB with close supervision, however, required min-mod A to stand from [facility] with extensive education provided regarding proper technique for sit <> stand at [place]. Pt appearing more receptive to education this session and trying new ways for transferring techniques.  She ambulated to ADL apartment with steadying assist. Education and demonstration provided regarding use of tub transfer bench for shower transfer. She completed simulated shower transfer with mod A, requiring A for management of B LEs into tub.  She ambulated back to room in same manner as described above. She voiced need for toileting task and completed stand pivot to [facility] with RW. With steadying assist, she was able to manage clothing and sat to complete pericare/ hygiene. She required increased assist- max A-  for standing from [facility] due to fatigue. Pt left seated EOB at end of session, all needs in reach. Made aware of need  to call for assist if she needs to get up as son no longer present.    Therapy Documentation Precautions:  Precautions Precautions: Fall, Back Precaution Booklet Issued: No Precaution Comments: pt able to verbally recall 3/3 precautions. Needs cueing during functional activities. Required Braces or Orthoses: Spinal Brace Spinal Brace: Lumbar corset, Applied in sitting position Restrictions Weight Bearing Restrictions: No Pain: Pain Assessment Pain Score: No/ denies pain  See Function Navigator for Current Functional Status.   Therapy/Group: Individual Therapy  Lewis, Artice Holohan C 06/13/2016, 6:51 AM

## 2016-06-13 NOTE — Progress Notes (Signed)
Physical Therapy Session Note  Patient Details  Name: [name] MRN: [id number] Date of Birth: 04-May-1946  Today's Date: 06/13/2016 PT Individual Time: 0800-0900 PT Individual Time Calculation (min): 60 min   Short Term Goals: Week 1:  PT Short Term Goal 1 (Week 1): Patient will be able to perform sit to stand from regular height bed with CGA.  PT Short Term Goal 2 (Week 1): Patient will increase B Quad strength to 3+/5 in order to improve transfers PT Short Term Goal 3 (Week 1): Patient will be able to ambulate with RW on a distance of 200 feet with CGA and improved step length and velocity  Skilled Therapeutic Interventions/Progress Updates:   Session focused on functional transfers, gait training with RW, and problem solving and practicing simulated car transfer. Pt requires extra time and education on new techniques but agreeable to try them. Transfers from elevated surface with min assist and from lower surface with mod assist with cues for technique and hand placement. Close supervision to steadying assist for gait with RW with cues for for posture, step length, increasing WB to RLE, and normalizing gait pattern. Problem solved technique with car transfer with recommendation by PT to trial real car transfer at some point (son made aware of this too). Pt performed simulated car with mod assist and assist by PT to manage BLE in/out of car.   Therapy Documentation Precautions:  Precautions Precautions: Fall, Back Precaution Booklet Issued: No Precaution Comments: pt able to verbally recall 3/3 precautions. Needs cueing during functional activities. Required Braces or Orthoses: Spinal Brace Spinal Brace: Lumbar corset, Applied in sitting position Restrictions Weight Bearing Restrictions: No Pain:  Premedicated for back pain. Not rated.  See Function Navigator for Current Functional Status.   Therapy/Group: Individual Therapy  Canary Brim Ivory Broad, PT,  DPT   06/13/2016, 10:01 AM

## 2016-06-13 NOTE — Progress Notes (Signed)
Subjective: Patient reports "I'm doing better. My incision drained a little last night"  Objective: Vital signs in last 24 hours: Temp:  [97.8 F (36.6 C)-97.9 F (36.6 C)] 97.9 F (36.6 C) (06/27 0532) Pulse Rate:  [54-81] 54 (06/27 0532) Resp:  [16] 16 (06/27 0532) BP: (129-133)/(56-70) 133/56 mmHg (06/27 0532) SpO2:  [99 %-100 %] 99 % (06/27 0532)  Intake/Output from previous day: 06/26 0701 - 06/27 0700 In: 240 [P.O.:240] Out: -  Intake/Output this shift:    Alert sitting on edge of bed, eating breakfast. Son present. Pt pleased with her increased mobility, walking with PT and increased hip flexor strength bilaterally. Incision with DSD, recently changed by Dr. Letta Pate, reporting serous drainage from distal tip, without erythema or swelling. Pt reports no pain at present.   Lab Results:  Recent Labs  06/12/16 0612  WBC 5.6  HGB 9.2*  HCT 28.1*  PLT 375   BMET  Recent Labs  06/12/16 0612  NA 134*  K 3.3*  CL 98*  CO2 29  GLUCOSE 80  BUN 6  CREATININE 0.49  CALCIUM 8.7*    Studies/Results: No results found.  Assessment/Plan: Improvin   LOS: 4 days  Continuing therapies, drsg changes & wound monitoring as needed.   Verdis Prime 06/13/2016, 8:06 AM

## 2016-06-13 NOTE — Progress Notes (Signed)
Speech Language Pathology Discharge Summary  Patient Details  Name: [name] MRN: [id number] Date of Birth: 07/11/1946  Today's Date: 06/13/2016 SLP Individual Time: 1300-1345 SLP Individual Time Calculation (min): 45 min   Skilled Therapeutic Interventions:  Skilled treatment session focused on dysphagia goals. Pt able to independently describe her chronic esophageal deficits, function and current modifications that she is making to decrease discomfort when eating. Education completed with pt and 2 sons. Pt tolerating current diet without overt s/s of aspiration. Precautions reviewed and pt able to verbal understanding. Pt left in care of her son and all needs within reach. Pt is managing her chronic esophageal issues without the intervention of ST.     Patient has met 1 of 1 long term goals.  Patient to discharge at overall Independent level.  Reasons goals not met: N/A  Clinical Impression/Discharge Summary: Pt with chronic dysphagia related to esophageal deficits but is able to tolerate current diet without overt s/s of aspiration. Pt able to verbalize understanding of deficits and diet modifications that may need to be made as a result. Pt currently making self-initiated modifications to her meals/food and has developed her own "personal regiment" of how to consume food, liquids and medicines to improve esophageal function.  Pt able to return demonstrate Masako exercises in effor to improve pharyngeal/esophegeal function but understands that it may not improve overall function. All questions were answered to pt and sons' satisfaction and all are agreeable to discharging from skilled ST.     Recommendation:  None      Equipment: N/A   Reasons for discharge: Treatment goals met   Patient/Family Agrees with Progress Made and Goals Achieved: Yes   Function:  Eating Eating   Modified Consistency Diet: No Eating Assist Level: Swallowing techniques: self managed            Cognition Comprehension Comprehension assist level: Follows complex conversation/direction with extra time/assistive device  Expression   Expression assist level: Expresses complex ideas: With extra time/assistive device  Social Interaction Social Interaction assist level: Interacts appropriately with others with medication or extra time (anti-anxiety, antidepressant).  Problem Solving Problem solving assist level: Solves basic problems with no assist  Memory Memory assist level: Recognizes or recalls 90% of the time/requires cueing < 10% of the time   Kesler Wickham 06/13/2016, 2:03 PM

## 2016-06-13 NOTE — Progress Notes (Signed)
Physical Therapy Session Note  Patient Details  Name: [name] MRN: [id number] Date of Birth: 1946-08-01  Today's Date: 06/13/2016 PT Individual Time: 1345-1415 PT Individual Time Calculation (min): 30 min   Short Term Goals: Week 1:  PT Short Term Goal 1 (Week 1): Patient will be able to perform sit to stand from regular height bed with CGA.  PT Short Term Goal 2 (Week 1): Patient will increase B Quad strength to 3+/5 in order to improve transfers PT Short Term Goal 3 (Week 1): Patient will be able to ambulate with RW on a distance of 200 feet with CGA and improved step length and velocity  Skilled Therapeutic Interventions/Progress Updates:    Family education with pt's son, [name], to check off on bed mobility and transfers with RW to [facility]. Instructed in sit <> stands from 24" bed height with focus on technique to include anterior weighshift and hand placement x 2 reps with min to mod assist with PT. Left EOB with all needs in reach and son present.   Therapy Documentation Precautions:  Precautions Precautions: Fall, Back Precaution Booklet Issued: No Precaution Comments: pt able to verbally recall 3/3 precautions. Needs cueing during functional activities. Required Braces or Orthoses: Spinal Brace Spinal Brace: Lumbar corset, Applied in sitting position Restrictions Weight Bearing Restrictions: No   See Function Navigator for Current Functional Status.   Therapy/Group: Individual Therapy  Canary Brim Ivory Broad, PT, DPT  06/13/2016, 2:31 PM

## 2016-06-14 ENCOUNTER — Inpatient Hospital Stay (HOSPITAL_COMMUNITY): Payer: Medicare HMO | Admitting: Occupational Therapy

## 2016-06-14 ENCOUNTER — Inpatient Hospital Stay (HOSPITAL_COMMUNITY): Payer: Medicare HMO

## 2016-06-14 DIAGNOSIS — D62 Acute posthemorrhagic anemia: Secondary | ICD-10-CM

## 2016-06-14 MED ORDER — CEPHALEXIN 250 MG/5ML PO SUSR
500.0000 mg | Freq: Three times a day (TID) | ORAL | Status: DC
  Administered 2016-06-14 – 2016-06-19 (×16): 500 mg via ORAL
  Filled 2016-06-14 (×18): qty 10

## 2016-06-14 NOTE — Progress Notes (Signed)
Subjective/Complaints: Up walking with OT in the hall. Pain under reasonable control. Missed a dose yesterday and got a little behind. Feels that she should be "doing better" than she is. Some wound drainage  ROS: Pt denies fever, rash/itching, headache, blurred or double vision, nausea, vomiting, abdominal pain, diarrhea, chest pain, shortness of breath, palpitations, dysuria, dizziness, , bleeding, anxiety, or depression    Objective: Vital Signs: Blood pressure 166/71, pulse 56, temperature 98.1 F (36.7 C), temperature source Oral, resp. rate 16, height _0  (1.473 m), weight 55.43 kg (122 lb 3.2 oz), SpO2 97 %. No results found. Results for orders placed or performed during the hospital encounter of 06/09/16 (from the past 72 hour(s))  Urinalysis, Routine w reflex microscopic (not at [facility])     Status: Abnormal   Collection Time: 06/11/16 11:36 PM  Result Value Ref Range   Color, Urine YELLOW YELLOW   APPearance CLOUDY (A) CLEAR   Specific Gravity, Urine 1.018 1.005 - 1.030   pH 7.0 5.0 - 8.0   Glucose, UA NEGATIVE NEGATIVE mg/dL   Hgb urine dipstick NEGATIVE NEGATIVE   Bilirubin Urine NEGATIVE NEGATIVE   Ketones, ur NEGATIVE NEGATIVE mg/dL   Protein, ur NEGATIVE NEGATIVE mg/dL   Nitrite NEGATIVE NEGATIVE   Leukocytes, UA SMALL (A) NEGATIVE  Urine culture     Status: Abnormal   Collection Time: 06/11/16 11:36 PM  Result Value Ref Range   Specimen Description URINE, CLEAN CATCH    Special Requests NONE    Culture MULTIPLE SPECIES PRESENT, SUGGEST RECOLLECTION (A)    Report Status 06/13/2016 FINAL   Urine microscopic-add on     Status: Abnormal   Collection Time: 06/11/16 11:36 PM  Result Value Ref Range   Squamous Epithelial / LPF 0-5 (A) NONE SEEN   WBC, UA 6-30 0 - 5 WBC/hpf   RBC / HPF NONE SEEN 0 - 5 RBC/hpf   Bacteria, UA RARE (A) NONE SEEN   Casts HYALINE CASTS (A) NEGATIVE  CBC WITH DIFFERENTIAL     Status: Abnormal   Collection Time: 06/12/16  6:12 AM   Result Value Ref Range   WBC 5.6 4.0 - 10.5 K/uL   RBC 3.25 (L) 3.87 - 5.11 MIL/uL   Hemoglobin 9.2 (L) 12.0 - 15.0 g/dL   HCT 28.1 (L) 36.0 - 46.0 %   MCV 86.5 78.0 - 100.0 fL   MCH 28.3 26.0 - 34.0 pg   MCHC 32.7 30.0 - 36.0 g/dL   RDW 15.5 11.5 - 15.5 %   Platelets 375 150 - 400 K/uL   Neutrophils Relative % 43 %   Neutro Abs 2.4 1.7 - 7.7 K/uL   Lymphocytes Relative 39 %   Lymphs Abs 2.2 0.7 - 4.0 K/uL   Monocytes Relative 14 %   Monocytes Absolute 0.8 0.1 - 1.0 K/uL   Eosinophils Relative 4 %   Eosinophils Absolute 0.2 0.0 - 0.7 K/uL   Basophils Relative 0 %   Basophils Absolute 0.0 0.0 - 0.1 K/uL  Comprehensive metabolic panel     Status: Abnormal   Collection Time: 06/12/16  6:12 AM  Result Value Ref Range   Sodium 134 (L) 135 - 145 mmol/L   Potassium 3.3 (L) 3.5 - 5.1 mmol/L   Chloride 98 (L) 101 - 111 mmol/L   CO2 29 22 - 32 mmol/L   Glucose, Bld 80 65 - 99 mg/dL   BUN 6 6 - 20 mg/dL   Creatinine, Ser 0.49 0.44 - 1.00 mg/dL  Calcium 8.7 (L) 8.9 - 10.3 mg/dL   Total Protein 5.0 (L) 6.5 - 8.1 g/dL   Albumin 2.2 (L) 3.5 - 5.0 g/dL   AST 23 15 - 41 U/L   ALT 14 14 - 54 U/L   Alkaline Phosphatase 79 38 - 126 U/L   Total Bilirubin 0.4 0.3 - 1.2 mg/dL   GFR calc non Af Amer >60 >60 mL/min   GFR calc Af Amer >60 >60 mL/min    Comment: (NOTE) The eGFR has been calculated using the CKD EPI equation. This calculation has not been validated in all clinical situations. eGFR's persistently <60 mL/min signify possible Chronic Kidney Disease.    Anion gap 7 5 - 15     HEENT: normal Cardio: RRR Resp: CTA B/L GI: BS positive and mild distention and tympany Extremity:  Pulses positive and No Edema Skin:   Wound dressed. Minimal drainage Neuro: Alert/Oriented and Abnormal Motor 4+ in BUE 3- BLE Musc/Skel:  Other limited lumbar an thoracic ROM Gen NAD   Assessment/Plan: 1. Functional deficits secondary to Lumbar radiculopathy s/p extension of Lumbar fusion to  include L5-S1 as well as T12-L2 ant and post approach which require 3+ hours per day of interdisciplinary therapy in a comprehensive inpatient rehab setting. Physiatrist is providing close team supervision and 24 hour management of active medical problems listed below. Physiatrist and rehab team continue to assess barriers to discharge/monitor patient progress toward functional and medical goals. FIM: Function - Bathing Position: Sitting EOB Body parts bathed by patient: Right arm, Left arm, Chest, Abdomen Body parts bathed by helper: Back Bathing not applicable: Front perineal area, Buttocks, Right upper leg, Left upper leg, Right lower leg, Left lower leg (Did not attempt secondary to pt felt she didn't need to wash them today.) Assist Level: Supervision or verbal cues  Function- Upper Body Dressing/Undressing What is the patient wearing?: Pull over shirt/dress, Bra Bra - Perfomed by patient: Thread/unthread right bra strap, Thread/unthread left bra strap, Hook/unhook bra (pull down sports bra) Pull over shirt/dress - Perfomed by patient: Thread/unthread right sleeve, Thread/unthread left sleeve, Put head through opening, Pull shirt over trunk Pull over shirt/dress - Perfomed by helper: Put head through opening, Pull shirt over trunk Assist Level: Supervision or verbal cues Function - Lower Body Dressing/Undressing What is the patient wearing?: Non-skid slipper socks Position: Sitting EOB Underwear - Performed by helper: Thread/unthread right underwear leg, Thread/unthread left underwear leg, Pull underwear up/down Pants- Performed by helper: Thread/unthread right pants leg, Thread/unthread left pants leg, Pull pants up/down Non-skid slipper socks- Performed by helper: Don/doff right sock, Don/doff left sock Socks - Performed by helper: Don/doff right sock, Don/doff left sock Assist for footwear: Dependant Assist for lower body dressing:  (helper completed  all, total assist)  Function -  Toileting Toileting steps completed by patient: Adjust clothing prior to toileting, Performs perineal hygiene, Adjust clothing after toileting Toileting steps completed by helper: Adjust clothing prior to toileting, Performs perineal hygiene, Adjust clothing after toileting Toileting Assistive Devices: Grab bar or rail Assist level: Touching or steadying assistance (Pt.75%)  Function - Air cabin crew transfer assistive device: Bedside commode, Walker Assist level to toilet: Moderate assist (Pt 50 - 74%/lift or lower) Assist level from toilet: Moderate assist (Pt 50 - 74%/lift or lower) Assist level to bedside commode (at bedside): Moderate assist (Pt 50 - 74%/lift or lower) Assist level from bedside commode (at bedside): Moderate assist (Pt 50 - 74%/lift or lower)  Function - Chair/bed transfer Chair/bed transfer  method: Ambulatory Chair/bed transfer assist level: Touching or steadying assistance (Pt > 75%) Chair/bed transfer assistive device: Walker, Armrests, Orthosis Chair/bed transfer details: Tactile cues for weight shifting, Manual facilitation for weight shifting, Verbal cues for technique  Function - Locomotion: Wheelchair Will patient use wheelchair at discharge?: No Function - Locomotion: Ambulation Assistive device: Walker-rolling, Orthosis Max distance: 150' Assist level: Touching or steadying assistance (Pt > 75%) Assist level: Touching or steadying assistance (Pt > 75%) Assist level: Touching or steadying assistance (Pt > 75%) Walk 150 feet activity did not occur: Safety/medical concerns Assist level: Touching or steadying assistance (Pt > 75%) Walk 10 feet on uneven surfaces activity did not occur: Safety/medical concerns  Function - Comprehension Comprehension: Auditory Comprehension assist level: Follows complex conversation/direction with extra time/assistive device  Function - Expression Expression: Verbal Expression assist level: Expresses complex  ideas: With extra time/assistive device  Function - Social Interaction Social Interaction assist level: Interacts appropriately with others with medication or extra time (anti-anxiety, antidepressant).  Function - Problem Solving Problem solving assist level: Solves basic problems with no assist  Function - Memory Memory assist level: Recognizes or recalls 90% of the time/requires cueing < 10% of the time Patient normally able to recall (first 3 days only): Current season, Location of own room, Staff names and faces, That he or she is in a hospital   Medical Problem List and Plan: 1.  Decreased functional mobility secondary to lumbar stenosis with radiculopathy. Lumbar corset when out of bed, CIR PT, OT continue 2.  DVT Prophylaxis/Anticoagulation: SCDs. Check vascular studies upon admit.Doppler negative, LE swelling multifactorial venous insuff, paraparesis, add THigh hi TEDs 3. Pain Management: Percocet and Robaxin as needed. Monitor with increased mobility/therapy             -kpad/ice prn  -pt prefers to use meds on a PRN schedule for now (working with RN)   4. Acute blood loss anemia. Follow-up CBC. Continue iron supplement, Hgb 9.2, down from 10.1 a week ago, will monitor, pt reluctant to take Fe--placed on niferex 5. Neuropsych: This patient is capable of making decisions on her own behalf. 6. Skin/Wound Care: Routine skin checks. Dry dressing/honeycomb to surgical site---persistent inferior serosanguinous drainage.    -keflex 7. Fluids/Electrolytes/Nutrition: Routine I&O's with follow-up chemistries upon admit.BMET normal 8. Rheumatoid arthritis/polymyositis. Continue Plaquenil 200 mg daily, prednisone 5 mg daily 9. Hypertension. Lopressor 25 mg twice a day. 10.  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  Urinary inc  UA negative 12.  Constipation add senna 13.  HypoK on supplement for 3.3   LOS (Days) 5 A FACE TO FACE  EVALUATION WAS PERFORMED  Freedom Lopezperez T 06/14/2016, 8:42 AM

## 2016-06-14 NOTE — Progress Notes (Signed)
Physical Therapy Session Note  Patient Details  Name: [name] MRN: [id number] Date of Birth: [birth year]  Today's Date: 06/14/2016 PT Individual Time: 0800-0930 PT Individual Time Calculation (min): 90 min   Short Term Goals: Week 1:  PT Short Term Goal 1 (Week 1): Patient will be able to perform sit to stand from regular height bed with CGA.  PT Short Term Goal 2 (Week 1): Patient will increase B Quad strength to 3+/5 in order to improve transfers PT Short Term Goal 3 (Week 1): Patient will be able to ambulate with RW on a distance of 200 feet with CGA and improved step length and velocity  Skilled Therapeutic Interventions/Progress Updates:    Session focused on functional transfers, gait training with RW, postural exercises, sit <> stands, donning/doffing and positioning of LSO and seated LE therex to address functional strengthening. Pt requires supervision to mod assist for sit <> stands from surfaces of 23" (supervision), 22" (min), and 21.5" (mod) with cues for technique and hand placement. Pt with multiple orthopedic chronic postural abnormalities limiting mobility and compensatory strategies engrained in patient. Pt able to gait with RW with supervision to steadying assist with focus on normalized gait pattern, increasing weightbearing to RLE, and posture. Discussed possible trial with heel lift when able to wear shoes. Trial with recliner in room for edema control in BLE with cushion placed in chair to raise seat up and positioned with pillows for comfort.   Therapy Documentation Precautions:  Precautions Precautions: Fall, Back Precaution Booklet Issued: No Precaution Comments: pt able to verbally recall 3/3 precautions. Needs cueing during functional activities. Required Braces or Orthoses: Spinal Brace Spinal Brace: Lumbar corset, Applied in sitting position Restrictions Weight Bearing Restrictions: No   Pain: Premedicated for back pain and muscular pain.   See  Function Navigator for Current Functional Status.   Therapy/Group: Individual Therapy  Canary Brim Ivory Broad, PT, DPT  06/14/2016, 9:36 AM

## 2016-06-14 NOTE — Care Management Note (Signed)
Inpatient [facility] Individual Statement of Services  Patient Name:  [name]  Date:  06/12/2016  Welcome to the [facility].  Our goal is to provide you with an individualized program based on your diagnosis and situation, designed to meet your specific needs.  With this comprehensive rehabilitation program, you will be expected to participate in at least 3 hours of rehabilitation therapies Monday-Friday, with modified therapy programming on the weekends.  Your rehabilitation program will include the following services:  Physical Therapy (PT), Occupational Therapy (OT), Speech Therapy (ST), 24 hour per day rehabilitation nursing, Therapeutic Recreaction (TR), Case Management (Social Worker), Rehabilitation Medicine, Nutrition Services and Pharmacy Services  Weekly team conferences will be held on Tuesdays to discuss your progress.  Your Social Worker will talk with you frequently to get your input and to update you on team discussions.  Team conferences with you and your family in attendance may also be held.  Expected length of stay: 12-14 days  Overall anticipated outcome: modified independent  Depending on your progress and recovery, your program may change. Your Social Worker will coordinate services and will keep you informed of any changes. Your Social Worker's name and contact numbers are listed  below.  The following services may also be recommended but are not provided by the [facility] will be made to provide these services after discharge if needed.  Arrangements include referral to agencies that provide these services.  Your insurance has been verified to be:  [employer] Your primary doctor is:  Dr. Shelia Media  Pertinent information will be shared with your doctor and your insurance company.  Social  Worker:  [employer], [place] or ([phone]   Information discussed with and copy given to patient by: Lennart Pall, 06/12/2016, 5:05 PM

## 2016-06-14 NOTE — Patient Care Conference (Signed)
Inpatient RehabilitationTeam Conference and Plan of Care Update Date: 06/13/2016   Time: 11:40 AM    Patient Name: [name]      Medical Record Number: [id number]  Date of Birth: 28-Apr-1946 Sex: Female         Room/Bed: 4M06C/4M06C-01 Payor Info: Payor: [employer] MEDICARE / Plan: [place] MEDICARE HMO/PPO / Product Type: *No Product type* /    Admitting Diagnosis: Lumbar Fusion  Admit Date/Time:  06/09/2016  7:47 PM Admission Comments: No comment available   Primary Diagnosis:  Lumbar radiculopathy Principal Problem: Lumbar radiculopathy  Patient Active Problem List   Diagnosis Date Noted  . Lumbar radiculopathy 06/09/2016  . Acute blood loss anemia   . Chronic low back pain   . Failed back syndrome   . Leukocytosis   . Polymyositis ([place])   . Rheumatoid arthritis ([place])   . Sjogren's disease ([place])   . Surgery, elective   . Atypical chest pain   . Back pain 06/02/2016  . 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 Dysphagia   . Swelling of left lower extremity 10/15/2013  . Essential hypertension 10/15/2013  . UTI (lower urinary tract infection) 09/08/2013  . Elevated lipase 09/08/2013  . Weakness 09/08/2013  . Dehydration 09/08/2013  . S/P lumbar spinal fusion 09/08/2013  . Fever 09/08/2013  . Nausea 09/08/2013  . Headache 09/08/2013  . Hypokalemia 09/08/2013  . Hyponatremia 09/08/2013  . Anemia 09/08/2013  . Pancreatitis 09/08/2013  . Difficulty swallowing pills 09/08/2013  . Unspecified deficiency anemia 03/14/2013  . Uterine prolapse 04/16/2012  . Cystocele 04/16/2012  . Rectocele 04/16/2012  . Osteopenia   . IRON DEFICIENCY 01/12/2010  . 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 DYSPHAGIA UNSPECIFIED 01/12/2010    Expected Discharge Date: Expected Discharge Date: 06/22/16  Team Members Present: Physician leading conference: Dr. Alger Simons Nurse Present: Dorthula Nettles, RN PT Present: Canary Brim, PT OT Present: Napoleon Form,  OT     Current Status/Progress Goal Weekly Team Focus  Medical   ongoing pain issues. having problems with a myofasical trigger point/spasm, wound slowly healing  improve activity tolerance  see above   Bowel/Bladder   Contient of bowel/bladder. LBM 7/1  Patient to remain contient of bowel/bladder  Monitor bowel need q shift and as needed   Swallow/Nutrition/ Hydration   mod I- at baseline, no further SLP         ADL's   Supervision toileting; min-mod A functional transfers from standard height surface. Supervision from elevated surface. Min-mod LB dressing; Supervision bathing  Goals modified to supervision- min A overall, pt's sons available 24/7 to provided physical assist  Functional transfers; ADL re-training; family training and d/c planning   Mobility   supervision to mod assist for transfers (supervision if elevated); supervision gait  mod I for transfers if elevated; min assist bed mobility; mod I househol gait; min assist car  transfers, strengthening, education, endurance, balance, fam ed, d/c planning   Communication             Safety/Cognition/ Behavioral Observations            Pain   Constatly complaining of back pain. Schedule Oxy IR and Tramadol with Flexiril PRN  <3  Monitor for effectiveness of pain med q shift and as needed   Skin   Skin tear to R chin with tegaderm to area. Lumber incision with Dry dressing  Assess skin q shift and as needed       Rehab Goals Patient on  target to meet rehab goals: Yes *See Care Plan and progress notes for long and short-term goals.  Barriers to Discharge: pain, edema    Possible Resolutions to Barriers:  pain mgt,med adjustment of meds/ use of other modalities, pt ed    Discharge Planning/Teaching Needs:  Plan to d/c home with family able to provide 24/7 assist   Education completed   Team Discussion:  Pt currently min/mod assist with tfs but with mod independent goals.  Long standing esophageal issues - ST following  3x/wk.  Pt has very set ways of moving and much discussion with therapies when introducing new techniques.  Revisions to Treatment Plan:  None   Continued Need for Acute Rehabilitation Level of Care: The patient requires daily medical management by a physician with specialized training in physical medicine and rehabilitation for the following conditions: Daily direction of a multidisciplinary physical rehabilitation program to ensure safe treatment while eliciting the highest outcome that is of practical value to the patient.: Yes Daily medical management of patient stability for increased activity during participation in an intensive rehabilitation regime.: Yes Daily analysis of laboratory values and/or radiology reports with any subsequent need for medication adjustment of medical intervention for : Post surgical problems;Cardiac problems;Wound care problems  Antoni Stefan 06/21/2016, 2:58 PM

## 2016-06-14 NOTE — Progress Notes (Signed)
Social Work  Social Work Assessment and Plan  Patient Details  Name: [name] MRN: [id number] Date of Birth: 1946/05/06  Today's Date: 06/12/2016  Problem List:  Patient Active Problem List   Diagnosis Date Noted  . Radiculopathy 06/09/2016  . Acute blood loss anemia   . Chronic low back pain   . Failed back syndrome   . Leukocytosis   . Polymyositis ([place])   . Rheumatoid arthritis ([place])   . Sjogren's disease ([place])   . Surgery, elective   . Atypical chest pain   . Back pain 06/02/2016  . 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 Dysphagia   . Swelling of left lower extremity 10/15/2013  . Essential hypertension 10/15/2013  . UTI (lower urinary tract infection) 09/08/2013  . Elevated lipase 09/08/2013  . Weakness 09/08/2013  . Dehydration 09/08/2013  . S/P lumbar spinal fusion 09/08/2013  . Fever 09/08/2013  . Nausea 09/08/2013  . Headache 09/08/2013  . Hypokalemia 09/08/2013  . Hyponatremia 09/08/2013  . Anemia 09/08/2013  . Pancreatitis 09/08/2013  . Difficulty swallowing pills 09/08/2013  . Unspecified deficiency anemia 03/14/2013  . Uterine prolapse 04/16/2012  . Cystocele 04/16/2012  . Rectocele 04/16/2012  . Osteopenia   . IRON DEFICIENCY 01/12/2010  . 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 DYSPHAGIA UNSPECIFIED 01/12/2010   Past Medical History:  Past Medical History  Diagnosis Date  . HTN (hypertension)   . Glaucoma   . Sjogren's syndrome ([place])   . Raynaud phenomenon   . Polymyositis ([place])   . GERD (gastroesophageal reflux disease)   . Swallowing difficulty     unable to swallow whole pills  . Iron deficiency anemia     hx  . High cholesterol     "not severe; can't take the medicine" (09/08/2013)  . Multiple thyroid nodules   . Migraines     "hurt like a migraine; don't get them like I did when I was younger" (09/08/2013)  . OA (osteoarthritis)   . Chronic lower back pain     "for 6 years; relieved since back OR  07/2013" (09/08/2013)  . Swelling of left lower extremity   . Pancreatitis   . Diverticulosis   . Shingles   . Osteoporosis   . Difficult intubation     "ive been told im small", no problems listed with back sx in past  . Family history of anesthesia complication     "sister has severe sleep apnea; she has alot of trouble w/anesthesia" (09/08/2013)  . AAA (abdominal aortic aneurysm) ([place])     small      report in epic 11/11 states no aaa    . History of hiatal hernia     "small one"  . 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 PHN (postherpetic neuralgia)     right side from shingles   Past Surgical History:  Past Surgical History  Procedure Laterality Date  . Cataract extraction w/ intraocular lens  implant, bilateral Bilateral 12/2010-01/2011  . Esophageal dilation      "a few times before 02/2010; not since" (09/08/2013)  . Repair zenker's diverticula  02/2010    "diverticulectomy w/zenker's pouch correction" (09/08/2013)  . Dental surgery  11/2011-12/2011    one on each side in my bottom jaw; dental implants" (09/08/2013)  . Lumbar fusion  Aug 14, 2013    Dr. Consuello Masse  . Esophageal manometry N/A 02/25/2016    Procedure: ESOPHAGEAL MANOMETRY (EM);  Surgeon: Manus Gunning, MD;  Location: [facility];  Service: Gastroenterology;  Laterality: N/A;  . Esophagogastroduodenoscopy    . Anterior lumbar fusion N/A 06/02/2016    Procedure: Lumbar five-sacral one Anterior lumbar interbody fusion with Dr. Donnetta Hutching for approach;  Surgeon: Erline Levine, MD;  Location: [place] [facility];  Service: Neurosurgery;  Laterality: N/A;  . Posterior lumbar fusion 4 level Bilateral 06/02/2016    Procedure: Exploration of Fusion Lumbar two-five, Thoracic ten-ileum posterior fusion, Posterolateral arthrodesis with AIRO;  Surgeon: Erline Levine, MD;  Location: [place] [facility] OR[facility];  Service: Neurosurgery;  Laterality: Bilateral;  . Application of intraoperative ct scan N/A 06/02/2016    Procedure: APPLICATION OF INTRAOPERATIVE CT  SCAN;  Surgeon: Erline Levine, MD;  Location: [place] [facility];  Service: Neurosurgery;  Laterality: N/A;  . Anterior lat lumbar fusion  06/02/2016    Procedure: Thoracic twelve-Lumbar one, Lumbar one-two anterior Lateral Interbody Fusion;  Surgeon: Erline Levine, MD;  Location: [place] [facility]S;  Service: Neurosurgery;;  . Abdominal exposure N/A 06/02/2016    Procedure: ABDOMINAL EXPOSURE;  Surgeon: Rosetta Posner, MD;  Location: [facility];  Service: Vascular;  Laterality: N/A;   Social History:  reports that she has never smoked. She has never used smokeless tobacco. She reports that she does not drink alcohol or use illicit drugs.  Family / Support Systems Marital Status: Widow/Widower How Long?: 1+ yr Patient Roles: Parent, Other (Comment) (grandparent) Children: sons:  [name] @ (C) [phone];  Merry Proud @ (C) [phone]; Legrand Como and Scotty Anticipated Caregiver: [name], son, [name] a close friend and other sons Ability/Limitations of Caregiver: sons own [place] home. They rotate care to provide 24/7 care at home if needed Caregiver Availability: 24/7 Family Dynamics: All sons very involved and supportive.  Someone staying with pt almost all the time.  They note that their business is ~ 5 mins from pt's home.  Social History Preferred language: English Religion: Christian Cultural Background: NA Education: HS Read: Yes Write: Yes Employment Status: Retired Freight forwarder Issues: None Guardian/Conservator: None - per MD, pt is capable of making decisions on her own behalf.   Abuse/Neglect Physical Abuse: Denies Verbal Abuse: Denies Sexual Abuse: Denies Exploitation of patient/patient's resources: Denies Self-Neglect: Denies  Emotional Status Pt's affect, behavior adn adjustment status: Pt very talkative and requires some redirection to actually be able to gather needed assessment information.  Very pleasant and humorous.  She does become tearful when she talks about her  husband's death along with other stressors that have happended within the past couple of years.  She denies any significant emotional distress and no s/s of depression.  Will monitor. Recent Psychosocial Issues: As notes, husband's death as well as other deaths in the family. Pyschiatric History: None Substance Abuse History: None  Patient / Family Perceptions, Expectations & Goals Pt/Family understanding of illness & functional limitations: Pt and sons with very good understanding of her surgery and current functional limitations/ need for CIR. Premorbid pt/family roles/activities: PT was independent overall and using rollator for mobility. Anticipated changes in roles/activities/participation: little change anticipated as pt has mod independent goals and family has been checking on her daily even PTA Pt/family expectations/goals: "I just hope to not be a burden."  [employer]: None Premorbid Home Care/DME Agencies: Other (Comment) ([facility])) Transportation available at discharge: yes  Discharge Planning Living Arrangements: Alone Support Systems: Children, Other relatives, Friends/neighbors, Social worker community Type of Residence: Private residence Insurance Resources: [employer] (*Government social research officer) Museum/gallery curator Resources: Radio broadcast assistant Screen Referred: No Living Expenses: Own  Money Management: Patient Does the patient have any problems obtaining your medications?: No Home Management: pt and family  Patient/Family Preliminary Plans: Pt to return to her own home with family providing any needed assistance. Social Work Anticipated Follow Up Needs: HH/OP Expected length of stay: 12-14 days  Clinical Impression Very pleasant, talkative woman here following back surgery.  Family at bedside (has 4 adult sons) and extremely supportive and confirming they will provide any assistance necessary.  Pt denies any significant emotional distress.   Will follow for d/c and support needs.  Joanny Dupree 06/12/2016, 5:03 PM

## 2016-06-14 NOTE — Progress Notes (Signed)
Occupational Therapy Session Note  Patient Details  Name: [name] MRN: [id number] Date of Birth: 21-Feb-1946  Today's Date: 06/14/2016 OT Individual Time: 1400-1500 OT Individual Time Calculation (min): 60 min    Short Term Goals: Week 1:  OT Short Term Goal 1 (Week 1): Pt will be SBA with grooming OT Short Term Goal 2 (Week 1): Pt will be bathing using DME as needed with min assist OT Short Term Goal 3 (Week 1): Pt will be dressing with min assist OT Short Term Goal 4 (Week 1): Pt will transfer to 3n1 with min assist OT Short Term Goal 5 (Week 1): Pt will go from sit to stand with mod assist with high seated toilet  Skilled Therapeutic Interventions/Progress Updates:    Pt seen for skilled OT session focusing on functional mobility. Pt sitting EOB with son present and agreeable to tx upon arrival. Son assisted with [facility] transfer and was checked off to assist pt. Pt completed toileting and toilet hygiene requiring Mod A to stand from [facility] when finished. Pt ambulated throughout session with rw and steady A. Once in gym pt practiced sit to stand from mat at various heights. OT educated on proper techniques completing sit to stand using walker, but pt was unable to come to full upright stand. Therefore, pt showed OT self taught techniques that seem to work which includes pushing up from the mat with both hands, leaning far forward and wide BOS which requires pt to readjust feet when standing upright. Currently these techniques work for pt, but pt is at increased risk for falls due to the amount she has to lean forward. Educated pt on new skills to strengthen muscles and continuum of care. Pt ambulated to room and left sitting EOB with son present and needs met. Throughout session it is apparent that pt has compensated by using heights to help her sit to stand. If surface height is higher pt only requires steady A, but the lower the surface the more A she needs.   Therapy  Documentation Precautions:  Precautions Precautions: Fall, Back Precaution Booklet Issued: No Precaution Comments: pt able to verbally recall 3/3 precautions. Needs cueing during functional activities. Required Braces or Orthoses: Spinal Brace Spinal Brace: Lumbar corset, Applied in sitting position Restrictions Weight Bearing Restrictions: No  See Function Navigator for Current Functional Status.   Therapy/Group: Individual Therapy  Matilde Bash 06/14/2016, 3:08 PM

## 2016-06-14 NOTE — Progress Notes (Signed)
Subjective: Patient reports "I feel pretty good. I didnt drain through my dressing overnight"  Objective: Vital signs in last 24 hours: Temp:  [98.1 F (36.7 C)-98.4 F (36.9 C)] 98.1 F (36.7 C) (06/28 0529) Pulse Rate:  [56-63] 56 (06/28 0529) Resp:  [16-17] 16 (06/28 0529) BP: (133-166)/(71-74) 166/71 mmHg (06/28 0529) SpO2:  [97 %-99 %] 97 % (06/28 0529) Weight:  [55.43 kg (122 lb 3.2 oz)-55.656 kg (122 lb 11.2 oz)] 55.43 kg (122 lb 3.2 oz) (06/28 0529)  Intake/Output from previous day: 06/27 0701 - 06/28 0700 In: 720 [P.O.:720] Out: -  Intake/Output this shift:    Alert, conversant, sitting on egde of bed eating breakfast. Increased BLE edema, now with TED hose. Working with PT/OT and pleased with progress. Distal aspect of lumbar incision with 1cm area of dehiscence, small area of yellow tissue. No significant drainage on ABD's covering discolored honeycomb.   Lab Results:  Recent Labs  06/12/16 0612  WBC 5.6  HGB 9.2*  HCT 28.1*  PLT 375   BMET  Recent Labs  06/12/16 0612  NA 134*  K 3.3*  CL 98*  CO2 29  GLUCOSE 80  BUN 6  CREATININE 0.49  CALCIUM 8.7*    Studies/Results: No results found.  Assessment/Plan: Improving   LOS: 5 days  Ok per DrStern for any staff to change lumbar dressing PRN with DSD after cleansing with saline wipe or after washing with soapy water in shower. Continue to work with Rehab.    Verdis Prime 06/14/2016, 8:10 AM

## 2016-06-14 NOTE — Progress Notes (Signed)
Occupational Therapy Session Note  Patient Details  Name: [name] MRN: [id number] Date of Birth: 11/22/1946  Today's Date: 06/14/2016 OT Individual Time: 1030-1200 OT Individual Time Calculation (min): 90 min    Short Term Goals: Week 1:  OT Short Term Goal 1 (Week 1): Pt will be SBA with grooming OT Short Term Goal 2 (Week 1): Pt will be bathing using DME as needed with min assist OT Short Term Goal 3 (Week 1): Pt will be dressing with min assist OT Short Term Goal 4 (Week 1): Pt will transfer to 3n1 with min assist OT Short Term Goal 5 (Week 1): Pt will go from sit to stand with mod assist with high seated toilet  Skilled Therapeutic Interventions/Progress Updates:    Pt seen for skilled OT session focusing on self care and functional mobility. Upon arrival pt sitting in recliner with son present and aggreeable to tx session (son left prior to shower). Pt required mod A to stand from recliner and ambulated throughout session with CGA using RW. Pt ambulated to bathroom requesting to use BSC set up in bathroom instead of toilet. Pt able to complete peri hygiene, but required mod A to stand from toilet and pull up pants. Pt ambulated to rehab bathroom to complete showering using tub transfer bench (permission granted by MD and RN for shower). Pt required VC to sit on bench and required mod A to walk legs over the edge of the tub. Pt completed lateral leans on tub transfer bench to doff pants/underwear and was able to doff back brace and shirt with min A. Pt required total A to doff TEDs and shoes. Pt completed shower with supervision and VC for technique on how to wash buttocks. OT washed pt hair to prevent pt from getting incision wet. OT helped pt donn clothes from tub transfer bench due to time and had pt complete sit to stand using RW while OT pulled up pants. OT educated on how to use a reacher and have pt return demonstration, but will continue to work on this in next session.  Pt  ambulated back to room and left sitting EOB with all needs met and son present  Therapy Documentation Precautions:  Precautions Precautions: Fall, Back Precaution Booklet Issued: No Precaution Comments: pt able to verbally recall 3/3 precautions. Needs cueing during functional activities. Required Braces or Orthoses: Spinal Brace Spinal Brace: Lumbar corset, Applied in sitting position Restrictions Weight Bearing Restrictions: No  See Function Navigator for Current Functional Status.   Therapy/Group: Individual Therapy  Hannah Helms 06/14/2016, 12:10 PM  

## 2016-06-15 ENCOUNTER — Inpatient Hospital Stay (HOSPITAL_COMMUNITY): Payer: Medicare HMO | Admitting: Physical Therapy

## 2016-06-15 ENCOUNTER — Inpatient Hospital Stay (HOSPITAL_COMMUNITY): Payer: Medicare HMO | Admitting: Occupational Therapy

## 2016-06-15 ENCOUNTER — Inpatient Hospital Stay (HOSPITAL_COMMUNITY): Payer: Medicare HMO

## 2016-06-15 DIAGNOSIS — R600 Localized edema: Secondary | ICD-10-CM

## 2016-06-15 MED ORDER — CYCLOBENZAPRINE HCL 5 MG PO TABS
5.0000 mg | ORAL_TABLET | Freq: Three times a day (TID) | ORAL | Status: DC | PRN
  Administered 2016-06-15 (×2): 5 mg via ORAL
  Filled 2016-06-15 (×2): qty 1

## 2016-06-15 MED ORDER — OXYCODONE HCL 5 MG PO TABS
5.0000 mg | ORAL_TABLET | Freq: Four times a day (QID) | ORAL | Status: DC
  Administered 2016-06-15 – 2016-06-22 (×28): 5 mg via ORAL
  Filled 2016-06-15 (×28): qty 1

## 2016-06-15 MED ORDER — OXYCODONE HCL 5 MG PO TABS
10.0000 mg | ORAL_TABLET | Freq: Every day | ORAL | Status: DC
  Administered 2016-06-15 – 2016-06-21 (×7): 10 mg via ORAL
  Filled 2016-06-15 (×7): qty 2

## 2016-06-15 MED ORDER — FLEET ENEMA 7-19 GM/118ML RE ENEM: 1.0000 | ENEMA | Freq: Every day | RECTAL | Status: DC | PRN

## 2016-06-15 MED ORDER — TRIAMTERENE-HCTZ 37.5-25 MG PO TABS
1.0000 | ORAL_TABLET | Freq: Every day | ORAL | Status: DC
  Administered 2016-06-15 – 2016-06-18 (×4): 1 via ORAL
  Filled 2016-06-15 (×5): qty 1

## 2016-06-15 MED ORDER — SENNOSIDES-DOCUSATE SODIUM 8.6-50 MG PO TABS
2.0000 | ORAL_TABLET | Freq: Every day | ORAL | Status: DC
  Administered 2016-06-16 – 2016-06-17 (×2): 2 via ORAL
  Filled 2016-06-15 (×2): qty 2

## 2016-06-15 MED ORDER — TRAMADOL HCL 50 MG PO TABS
50.0000 mg | ORAL_TABLET | Freq: Three times a day (TID) | ORAL | Status: DC
  Administered 2016-06-15 – 2016-06-22 (×26): 50 mg via ORAL
  Filled 2016-06-15 (×27): qty 1

## 2016-06-15 MED ORDER — MAGNESIUM HYDROXIDE 400 MG/5ML PO SUSP
30.0000 mL | Freq: Every day | ORAL | Status: DC | PRN
  Administered 2016-06-15 – 2016-06-20 (×3): 30 mL via ORAL
  Filled 2016-06-15 (×3): qty 30

## 2016-06-15 NOTE — Plan of Care (Signed)
Problem: RH PAIN MANAGEMENT Goal: RH STG PAIN MANAGED AT OR BELOW PT'S PAIN GOAL Pain less than 4  Outcome: Not Progressing Reports pain as 6

## 2016-06-15 NOTE — Progress Notes (Signed)
Physical Therapy Session Note  Patient Details  Name: [name] MRN: [id number] Date of Birth: [birth year]  Today's Date: 06/15/2016 PT Individual Time: 1525-1620 PT Individual Time Calculation (min): 55 min   Short Term Goals: Week 1:  PT Short Term Goal 1 (Week 1): Patient will be able to perform sit to stand from regular height bed with CGA.  PT Short Term Goal 2 (Week 1): Patient will increase B Quad strength to 3+/5 in order to improve transfers PT Short Term Goal 3 (Week 1): Patient will be able to ambulate with RW on a distance of 200 feet with CGA and improved step length and velocity  Skilled Therapeutic Interventions/Progress Updates:   Patient sitting edge of bed with LSO donned and son Legrand Como present for session. Patient required encouragement to utilize wheelchair for transportation outdoors for energy conservation and due to time constraints as she stated she did not want to be in wheelchair and would be unable to stand from it due to slightly lower height but was eventually agreeable. Session focused on sit <> stand from EOB with min A and from wheelchair with mod A using RW, stand pivot transfers using RW with steady assist, functional ambulation in controlled, community, and outdoor environments including throughout hospital, on/off elevators, over thresholds, on uneven brick and concrete surfaces, and slight inclines/declines using RW for approx 1000 ft with close supervision and slow gait speed. Patient left sitting edge of bed with all needs in reach, son in room.    Therapy Documentation Precautions:  Precautions Precautions: Fall, Back Precaution Booklet Issued: No Precaution Comments: pt able to verbally recall 3/3 precautions. Needs cueing during functional activities. Required Braces or Orthoses: Spinal Brace Spinal Brace: Lumbar corset, Applied in sitting position Restrictions Weight Bearing Restrictions: No Pain: Unrated muscle pain, requested muscle  rub   See Function Navigator for Current Functional Status.   Therapy/Group: Individual Therapy  Laretta Alstrom 06/15/2016, 4:55 PM

## 2016-06-15 NOTE — Accreditation Note (Addendum)
Dressing changed per order. Surgical incision noted to have dermabond peeling along the proximal and mid portion of the incision. Moderate amount of serous drainage, from the distal end of the incision, visible on the gauze. Distal incision line also noted to have visible yellow slough

## 2016-06-15 NOTE — Progress Notes (Signed)
Subjective/Complaints: Would like pain medication scheduled now (discussed with her yesterday). Complains of leg swelling.  ROS: Pt denies fever, rash/itching, headache, blurred or double vision, nausea, vomiting, abdominal pain, diarrhea, chest pain, shortness of breath, palpitations, dysuria, dizziness, , bleeding, anxiety, or depression    Objective: Vital Signs: Blood pressure 151/95, pulse 73, temperature 98.4 F (36.9 C), temperature source Oral, resp. rate 17, height 4\' 10"  (1.473 m), weight 55.43 kg (122 lb 3.2 oz), SpO2 100 %. No results found. No results found for this or any previous visit (from the past 72 hour(s)).   HEENT: normal Cardio: RRR Resp: CTA B/L GI: BS positive and mild distention and tympany Extremity:  Pulses positive and 1+ Edema Skin:   Wound dressed. Minimal drainage distally.  Neuro: Alert/Oriented and Abnormal Motor 4+ in BUE 3- BLE Musc/Skel:  Other limited lumbar an thoracic ROM Gen NAD   Assessment/Plan: 1. Functional deficits secondary to Lumbar radiculopathy s/p extension of Lumbar fusion to include L5-S1 as well as T12-L2 ant and post approach which require 3+ hours per day of interdisciplinary therapy in a comprehensive inpatient rehab setting. Physiatrist is providing close team supervision and 24 hour management of active medical problems listed below. Physiatrist and rehab team continue to assess barriers to discharge/monitor patient progress toward functional and medical goals. FIM: Function - Bathing Bathing activity did not occur: N/A Position: Shower Body parts bathed by patient: Right arm, Left arm, Chest, Abdomen, Front perineal area, Buttocks, Right upper leg, Left upper leg, Right lower leg, Left lower leg, Back Body parts bathed by helper: Back Bathing not applicable: Front perineal area, Buttocks, Right upper leg, Left upper leg, Right lower leg, Left lower leg (Did not attempt secondary to pt felt she didn't need to wash them  today.) Assist Level: Supervision or verbal cues  Function- Upper Body Dressing/Undressing Upper body dressing/undressing activity did not occur: N/A What is the patient wearing?: Pull over shirt/dress Bra - Perfomed by patient: Thread/unthread right bra strap, Thread/unthread left bra strap, Hook/unhook bra (pull down sports bra) Pull over shirt/dress - Perfomed by patient: Thread/unthread right sleeve, Thread/unthread left sleeve, Put head through opening, Pull shirt over trunk Pull over shirt/dress - Perfomed by helper: Put head through opening, Pull shirt over trunk Assist Level: Set up Function - Lower Body Dressing/Undressing Lower body dressing/undressing activity did not occur: N/A What is the patient wearing?: Pants, Ted Hose, Underwear, Non-skid slipper socks Position: Other (comment) (Tub Producer, television/film/video) Underwear - Performed by patient: Thread/unthread right underwear leg, Thread/unthread left underwear leg Underwear - Performed by helper: Pull underwear up/down Pants- Performed by patient: Thread/unthread right pants leg, Thread/unthread left pants leg Pants- Performed by helper: Pull pants up/down Non-skid slipper socks- Performed by helper: Don/doff right sock, Don/doff left sock Socks - Performed by helper: Don/doff right sock, Don/doff left sock TED Hose - Performed by helper: Don/doff right TED hose, Don/doff left TED hose Assist for footwear: Dependant Assist for lower body dressing: Touching or steadying assistance (Pt > 75%)  Function - Toileting Toileting steps completed by patient: Adjust clothing prior to toileting, Performs perineal hygiene, Adjust clothing after toileting Toileting steps completed by helper: Adjust clothing prior to toileting, Performs perineal hygiene, Adjust clothing after toileting Toileting Assistive Devices: Grab bar or rail Assist level: Touching or steadying assistance (Pt.75%)  Function - Air cabin crew transfer assistive  device: Bedside commode, Walker Assist level to toilet: Moderate assist (Pt 50 - 74%/lift or lower) Assist level from toilet: Moderate assist (Pt  50 - 74%/lift or lower) Assist level to bedside commode (at bedside): Touching or steadying assistance (Pt > 75%) Assist level from bedside commode (at bedside): Moderate assist (Pt 50 - 74%/lift or lower)  Function - Chair/bed transfer Chair/bed transfer method: Ambulatory Chair/bed transfer assist level: Touching or steadying assistance (Pt > 75%) Chair/bed transfer assistive device: Walker, Armrests, Orthosis Chair/bed transfer details: Tactile cues for weight shifting, Manual facilitation for weight shifting, Verbal cues for technique  Function - Locomotion: Wheelchair Will patient use wheelchair at discharge?: No Function - Locomotion: Ambulation Assistive device: Walker-rolling, Orthosis Max distance: 150' Assist level: Touching or steadying assistance (Pt > 75%) Assist level: Supervision or verbal cues Assist level: Touching or steadying assistance (Pt > 75%) Walk 150 feet activity did not occur: Safety/medical concerns Assist level: Touching or steadying assistance (Pt > 75%) Walk 10 feet on uneven surfaces activity did not occur: Safety/medical concerns  Function - Comprehension Comprehension: Auditory Comprehension assist level: Follows complex conversation/direction with extra time/assistive device  Function - Expression Expression: Verbal Expression assist level: Expresses complex ideas: With extra time/assistive device  Function - Social Interaction Social Interaction assist level: Interacts appropriately with others with medication or extra time (anti-anxiety, antidepressant).  Function - Problem Solving Problem solving assist level: Solves basic problems with no assist  Function - Memory Memory assist level: Recognizes or recalls 90% of the time/requires cueing < 10% of the time Patient normally able to recall (first 3  days only): Current season, Location of own room, Staff names and faces, That he or she is in a hospital   Medical Problem List and Plan: 1.  Decreased functional mobility secondary to lumbar stenosis with radiculopathy. Lumbar corset when out of bed, CIR PT, OT continue 2.  DVT Prophylaxis/Anticoagulation: SCDs. Check vascular studies upon admit.Doppler negative, LE swelling multifactorial venous insuff, paraparesis, add THigh hi TEDs 3. Pain Management: will schedule oxycodone and ultram---             -kpad/ice prn  -change robaxin to flexeril   4. Acute blood loss anemia. Follow-up CBC. Continue iron supplement, Hgb 9.2, down from 10.1 a week ago, will monitor, pt reluctant to take Fe--placed on niferex 5. Neuropsych: This patient is capable of making decisions on her own behalf. 6. Skin/Wound Care: Routine skin checks. Drainage decreasing.    -keflex 7. Fluids/Electrolytes/Nutrition: Routine I&O's with follow-up chemistries upon admit   -re-check bmet tomorrow 8. Rheumatoid arthritis/polymyositis. Continue Plaquenil 200 mg daily, prednisone 5 mg daily 9. Hypertension. Lopressor 25 mg twice a day---stop and resume maxzide. 10.  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  Urinary inc  UA negative 12.  Constipation add senna 13.  HypoK on supplement for 3.3  -recheck labs in morning with initiation of maxzide  14. Edema/HTN:  -resume maxzide   LOS (Days) 6 A FACE TO FACE EVALUATION WAS PERFORMED  Dimitri Dsouza T 06/15/2016, 9:08 AM

## 2016-06-15 NOTE — Progress Notes (Signed)
Pt have not had a bowel movement since 06/12/16. States that her bowel pattern is every 4 days. Refusing sorbitol, miralex, and enema at this time. States that she would like to have Milk of Magnesia around bedtime, as this seems to work for her. Per pt if this does not work, she will prefer the enema.  The use of Milk of Magnesia was an recommendation from her Gastroenterologist, and have proved successful in the past. Pt also refusing Hydroxychloroquine Sulfate for her arthritis. Stated that she would like to follow up with her Rheumatologist before taking any additional doses.

## 2016-06-15 NOTE — Plan of Care (Signed)
Problem: SCI BOWEL ELIMINATION Goal: RH STG MANAGE BOWEL WITH ASSISTANCE STG Manage Bowel with Assistance. Mod I  Outcome: Not Progressing LBM 06-12-16 Goal: RH STG SCI MANAGE BOWEL WITH MEDICATION WITH ASSISTANCE STG SCI Manage bowel with medication with mod assistance.  Outcome: Not Progressing LBM 06-12-16

## 2016-06-15 NOTE — Progress Notes (Signed)
Occupational Therapy Session Note  Patient Details  Name: [name] MRN: [id number] Date of Birth: 11/05/1946  Today's Date: 06/15/2016 OT Individual Time: 0930-1100 OT Individual Time Calculation (min): 90 min    Short Term Goals: Week 1:  OT Short Term Goal 1 (Week 1): Pt will be SBA with grooming OT Short Term Goal 2 (Week 1): Pt will be bathing using DME as needed with min assist OT Short Term Goal 3 (Week 1): Pt will be dressing with min assist OT Short Term Goal 4 (Week 1): Pt will transfer to 3n1 with min assist OT Short Term Goal 5 (Week 1): Pt will go from sit to stand with mod assist with high seated toilet  Skilled Therapeutic Interventions/Progress Updates:    Pt seen for OT session focusing on ADL re-training. Pt sitting EOB upon arrival with son present and agreeable to tx session. She voiced need for toileting task. Pt required encouragement to stand from slightly lowered bed, able to achieve with mod A. She ambulated throughout room with RW with close supervision. Min A required for controlled descent onto [facility] and mod- max A to stand from [facility]. She ambulated to therapy gym with close supervision.   While sitting EOM, worked on problem solving varying techniques to don shoes. Demonstrated and educated regarding modified ways including crossing ankle over knee or turning to rest LE on mat/ bed. VCs required throughout to maintain spinal precautions during mobility. She voiced increased stretch in hip flexors during positioning which she stated "is exactly what I needed to be stretched" . Pt able to tolerate crossing ankle over knee and supporting L LE on mat, however, was unable to obtain positions with R LE due to pain and twisting/ arching when trying to obtain position. Pt provided with elastic shoe laces to ease dressing task.  Pt required multiple trials and max cuing to stand from EOM. Attempting to have pt bring feet underneath her when completing sit <> stand as she  prefers to have LEs extended. Appears to be more of a "mental road block" to have pt stand with feet supported underneath her. With mat elevated pt stood with +2 assist from mat. She ambulated back to room at end of session, left with all needs in reach and son present.   Session Two: Pt seen for OT session focusing on ADL re-training and functional standing balance/ endurance. Pt sitting EOB upon arrival with son present and agreeable to tx session. Worked on sit <> stand from lowered EOB, despite multiple trials pt unable to come into upright stand from lowered surface. +2 mod A required to stand, with L knee blocked to facilitate extension to come into upright stance. She ambulated throughout room with close supervision- CGA. She completed toileting task with supervision, requiring mod A to stand from [facility].  She completed grooming tasks standing at sink with set-up and close supervision, VCs required for RW management. She tolerated ~20 minutes of standing, demonstrating improved functional activity tolerance. She returned to EOB at end of session, left with all needs in reach and son present. Son [name], checked off to assist pt with ambulating to bathroom. Educated regarding not walking if pt overly fatigued or when experiencing adverse side effects of medication (dizziness, lightheaded, etc.)  Therapy Documentation Precautions:  Precautions Precautions: Fall, Back Precaution Booklet Issued: No Precaution Comments: pt able to verbally recall 3/3 precautions. Needs cueing during functional activities. Required Braces or Orthoses: Spinal Brace Spinal Brace: Lumbar corset, Applied in sitting  position Restrictions Weight Bearing Restrictions: No Pain: Pain Assessment Pain Score: 6 , RN aware and medication administered  See Function Navigator for Current Functional Status.   Therapy/Group: Individual Therapy  Lewis, Shantella Blubaugh C 06/15/2016, 10:33 AM

## 2016-06-15 NOTE — Progress Notes (Signed)
Subjective: Patient reports "I think I'm doing better. I have a spasm every once in a while, but I'm not hurting now"  Objective: Vital signs in last 24 hours: Temp:  [97.9 F (36.6 C)-98.4 F (36.9 C)] 98 F (36.7 C) (06/29 1353) Pulse Rate:  [56-76] 56 (06/29 1353) Resp:  [16-18] 18 (06/29 1353) BP: (127-151)/(65-95) 137/68 mmHg (06/29 1353) SpO2:  [100 %] 100 % (06/29 1353)  Intake/Output from previous day: 06/28 0701 - 06/29 0700 In: 720 [P.O.:720] Out: -  Intake/Output this shift: Total I/O In: 240 [P.O.:240] Out: -   Alert sitting on edge of bed, eating lunch. BLE edema improving. Drsg with moderate amount greenish drainage. Pt states last drsg change 2 days ago (?). Incision without erythema. Dehisced area remains ~1cm with ~1cm depth. No drainage expressed with saline wipe cleanse.  Keflex continues.  Lab Results: No results for input(s): WBC, HGB, HCT, PLT in the last 72 hours. BMET No results for input(s): NA, K, CL, CO2, GLUCOSE, BUN, CREATININE, CALCIUM in the last 72 hours.  Studies/Results: No results found.  Assessment/Plan: Improving   LOS: 6 days  Continue Rehab. Drsg changes BID and prn per DrStern.    Verdis Prime 06/15/2016, 2:16 PM

## 2016-06-15 NOTE — Progress Notes (Signed)
Social Work Patient ID: [name], female   DOB: 03/11/1946, 69 y.o.   MRN: [id number]   Met with pt and son yesterday to review team conference.  Both aware and agreeable with targeted d/c date of 7/7 with mod independent goals.  Both pleased with progress she is making.  Will continue to follow.  HOYLE, LUCY, LCSW  

## 2016-06-15 NOTE — Progress Notes (Signed)
Physical Therapy Session Note  Patient Details  Name: [name] MRN: [id number] Date of Birth: Jul 12, 1946  Today's Date: 06/15/2016 PT Individual Time: 0800-0900 PT Individual Time Calculation (min): 60 min   Short Term Goals: Week 1:  PT Short Term Goal 1 (Week 1): Patient will be able to perform sit to stand from regular height bed with CGA.  PT Short Term Goal 2 (Week 1): Patient will increase B Quad strength to 3+/5 in order to improve transfers PT Short Term Goal 3 (Week 1): Patient will be able to ambulate with RW on a distance of 200 feet with CGA and improved step length and velocity  Skilled Therapeutic Interventions/Progress Updates:    Session focused on functional transfers with RW with cues for technique and hand placement including toileting (up to mod assist required; supervision from 22" mat), supervision to standing balance for hygiene with toileting and at the sink, gait training with RW with focus on normalizing gait pattern and posture with close supervision during turns, and step-up onto 2" step to simulate the step she uses at home x 4 reps with RW for support. Discussed planning with son for real car transfer next week as well as amount of available assist at home upon d/c.    Therapy Documentation Precautions:  Precautions Precautions: Fall, Back Precaution Booklet Issued: No Precaution Comments: pt able to verbally recall 3/3 precautions. Needs cueing during functional activities. Required Braces or Orthoses: Spinal Brace Spinal Brace: Lumbar corset, Applied in sitting position Restrictions Weight Bearing Restrictions: No  Pain: Reports muscle spasms last night but premedicated.  See Function Navigator for Current Functional Status.   Therapy/Group: Individual Therapy  Canary Brim [facility] 06/15/2016, 9:14 AM

## 2016-06-16 ENCOUNTER — Inpatient Hospital Stay (HOSPITAL_COMMUNITY): Payer: Medicare HMO | Admitting: Occupational Therapy

## 2016-06-16 ENCOUNTER — Inpatient Hospital Stay (HOSPITAL_COMMUNITY): Payer: Medicare HMO

## 2016-06-16 DIAGNOSIS — T814XXS Infection following a procedure, sequela: Secondary | ICD-10-CM

## 2016-06-16 DIAGNOSIS — E871 Hypo-osmolality and hyponatremia: Secondary | ICD-10-CM

## 2016-06-16 LAB — BASIC METABOLIC PANEL
ANION GAP: 7 (ref 5–15)
BUN: 7 mg/dL (ref 6–20)
CHLORIDE: 94 mmol/L — AB (ref 101–111)
CO2: 29 mmol/L (ref 22–32)
CREATININE: 0.62 mg/dL (ref 0.44–1.00)
Calcium: 9 mg/dL (ref 8.9–10.3)
GFR calc non Af Amer: >60 mL/min (ref >60)
Glucose, Bld: 93 mg/dL (ref 65–99)
Potassium: 3.4 mmol/L — ABNORMAL LOW (ref 3.5–5.1)
SODIUM: 130 mmol/L — AB (ref 135–145)

## 2016-06-16 LAB — CBC
HEMATOCRIT: 29.9 % — AB (ref 36.0–46.0)
HEMOGLOBIN: 9.5 g/dL — AB (ref 12.0–15.0)
MCH: 27.9 pg (ref 26.0–34.0)
MCHC: 31.8 g/dL (ref 30.0–36.0)
MCV: 87.9 fL (ref 78.0–100.0)
Platelets: 439 10*3/uL — ABNORMAL HIGH (ref 150–400)
RBC: 3.4 MIL/uL — AB (ref 3.87–5.11)
RDW: 15.5 % (ref 11.5–15.5)
WBC: 5.5 10*3/uL (ref 4.0–10.5)

## 2016-06-16 MED ORDER — CYCLOBENZAPRINE HCL 5 MG PO TABS
5.0000 mg | ORAL_TABLET | Freq: Every evening | ORAL | Status: DC | PRN
  Administered 2016-06-16 – 2016-06-19 (×4): 5 mg via ORAL
  Filled 2016-06-16 (×4): qty 1

## 2016-06-16 NOTE — Progress Notes (Signed)
Physical Therapy Weekly Progress Note  Patient Details  Name: [name] MRN: [id number] Date of Birth: [birth year]  Beginning of progress report period: June 10, 2016 End of progress report period: June 16, 2016  Today's Date: 06/16/2016 PT Individual Time: 0800-0930 PT Individual Time Calculation (min): 90 min  PT donned Tedhose and shoes EOB to prepare for session. Session focused on functional transfers, gait training with RW with focus on normalizing gait pattern, and [place] [facility] for BLE strengthening and balance. Pt performed 10 reps sets for BLE including LAQ with 5 second hold, standing hip abduction, standing hamstring curls, mini squats, seated toe raises and standing heel raises. Cues given for correct technique. Also instructed in serial sit <> stands x 5 reps with focus on technique.     Patient has met 1 of 3 short term goals.  Pt is making good progress though continues to be limited with sit <> stands for transfers on low surfaces due to weakness and long standing methods of which pt has difficulty adapting to new techniques. Pt also with chronic postural abnormalities and compensatory strategies. Ongoing family education with pt's sons who will be providing assistance upon discharge. Planning for patient to have 24/7 supervision assist.   Patient continues to demonstrate the following deficits: impaired balance, decreased strength, abnormal posture, decreased functional mobility, pain, and therefore will continue to benefit from skilled PT intervention to enhance overall performance with activity tolerance, balance, ability to compensate for deficits, functional use of  right lower extremity and left lower extremity, coordination and knowledge of precautions.  Patient progressing toward long term goals..  Continue plan of care.  PT Short Term Goals Week 1:  PT Short Term Goal 1 (Week 1): Patient will be able to perform sit to stand from regular height bed with CGA.  PT Short  Term Goal 1 - Progress (Week 1): Not met (requires up to mod assist) PT Short Term Goal 2 (Week 1): Patient will increase B Quad strength to 3+/5 in order to improve transfers PT Short Term Goal 2 - Progress (Week 1): Not met (3-/5 grossly; (more ROM on L than R)) PT Short Term Goal 3 (Week 1): Patient will be able to ambulate with RW on a distance of 200 feet with CGA and improved step length and velocity PT Short Term Goal 3 - Progress (Week 1): Met Week 2:  PT Short Term Goal 1 (Week 2): = LTGs  Skilled Therapeutic Interventions/Progress Updates:  Ambulation/gait training;Pain management;Stair training;Therapeutic Exercise;Therapeutic Activities;UE/LE Strength taining/ROM;UE/LE Coordination activities;Functional mobility training;Balance/vestibular training;Community reintegration;Discharge planning;Disease management/prevention;Patient/family education;Neuromuscular re-education;Splinting/orthotics;Skin care/wound management;Psychosocial support;DME/adaptive equipment instruction   Therapy Documentation Precautions:  Precautions Precautions: Fall, Back Precaution Booklet Issued: No Precaution Comments: pt able to verbally recall 3/3 precautions. Needs cueing during functional activities. Required Braces or Orthoses: Spinal Brace Spinal Brace: Lumbar corset, Applied in sitting position Restrictions Weight Bearing Restrictions: No   Pain: Premedicated.   See Function Navigator for Current Functional Status.  Therapy/Group: Individual Therapy  Canary Brim Ivory Broad, PT, DPT  06/16/2016, 9:33 AM

## 2016-06-16 NOTE — Plan of Care (Signed)
Problem: SCI BOWEL ELIMINATION Goal: RH STG SCI MANAGE BOWEL WITH MEDICATION WITH ASSISTANCE STG SCI Manage bowel with medication with mod assistance.  Outcome: Not Progressing No bm x 4d, prn laxative being given

## 2016-06-16 NOTE — Progress Notes (Signed)
Subjective/Complaints: Would like pain medication scheduled now (discussed with her yesterday). Complains of leg swelling.  ROS: Pt denies fever, rash/itching, headache, blurred or double vision, nausea, vomiting, abdominal pain, diarrhea, chest pain, shortness of breath, palpitations, dysuria, dizziness, , bleeding, anxiety, or depression    Objective: Vital Signs: Blood pressure 112/69, pulse 58, temperature 98.4 F (36.9 C), temperature source Oral, resp. rate 18, height '4\' 10"'  (1.473 m), weight 55.43 kg (122 lb 3.2 oz), SpO2 99 %. No results found. Results for orders placed or performed during the hospital encounter of 06/09/16 (from the past 72 hour(s))  CBC     Status: Abnormal   Collection Time: 06/16/16  5:33 AM  Result Value Ref Range   WBC 5.5 4.0 - 10.5 K/uL   RBC 3.40 (L) 3.87 - 5.11 MIL/uL   Hemoglobin 9.5 (L) 12.0 - 15.0 g/dL   HCT 29.9 (L) 36.0 - 46.0 %   MCV 87.9 78.0 - 100.0 fL   MCH 27.9 26.0 - 34.0 pg   MCHC 31.8 30.0 - 36.0 g/dL   RDW 15.5 11.5 - 15.5 %   Platelets 439 (H) 150 - 400 K/uL  Basic metabolic panel     Status: Abnormal   Collection Time: 06/16/16  5:33 AM  Result Value Ref Range   Sodium 130 (L) 135 - 145 mmol/L   Potassium 3.4 (L) 3.5 - 5.1 mmol/L   Chloride 94 (L) 101 - 111 mmol/L   CO2 29 22 - 32 mmol/L   Glucose, Bld 93 65 - 99 mg/dL   BUN 7 6 - 20 mg/dL   Creatinine, Ser 0.62 0.44 - 1.00 mg/dL   Calcium 9.0 8.9 - 10.3 mg/dL   GFR calc non Af Amer >60 >60 mL/min   GFR calc Af Amer >60 >60 mL/min    Comment: (NOTE) The eGFR has been calculated using the CKD EPI equation. This calculation has not been validated in all clinical situations. eGFR's persistently <60 mL/min signify possible Chronic Kidney Disease.    Anion gap 7 5 - 15     HEENT: normal Cardio: RRR Resp: CTA B/L GI: BS positive and mild distention and tympany Extremity:  Pulses positive and 1+ to 2+ pedal Edema Skin:   Wound dressed. Minimal drainage distally.   Neuro: Alert/Oriented and Abnormal Motor 4+ in BUE 3- BLE Musc/Skel:  Other limited lumbar an thoracic ROM Gen NAD   Assessment/Plan: 1. Functional deficits secondary to Lumbar radiculopathy s/p extension of Lumbar fusion to include L5-S1 as well as T12-L2 ant and post approach which require 3+ hours per day of interdisciplinary therapy in a comprehensive inpatient rehab setting. Physiatrist is providing close team supervision and 24 hour management of active medical problems listed below. Physiatrist and rehab team continue to assess barriers to discharge/monitor patient progress toward functional and medical goals. FIM: Function - Bathing Bathing activity did not occur: N/A Position: Shower Body parts bathed by patient: Right arm, Left arm, Chest, Abdomen, Front perineal area, Buttocks, Right upper leg, Left upper leg, Right lower leg, Left lower leg, Back Body parts bathed by helper: Back Bathing not applicable: Front perineal area, Buttocks, Right upper leg, Left upper leg, Right lower leg, Left lower leg (Did not attempt secondary to pt felt she didn't need to wash them today.) Assist Level: Supervision or verbal cues  Function- Upper Body Dressing/Undressing Upper body dressing/undressing activity did not occur: N/A What is the patient wearing?: Pull over shirt/dress Bra - Perfomed by patient: Thread/unthread right bra  strap, Thread/unthread left bra strap, Hook/unhook bra (pull down sports bra) Pull over shirt/dress - Perfomed by patient: Thread/unthread right sleeve, Thread/unthread left sleeve, Put head through opening, Pull shirt over trunk Pull over shirt/dress - Perfomed by helper: Put head through opening, Pull shirt over trunk Assist Level: Set up Function - Lower Body Dressing/Undressing Lower body dressing/undressing activity did not occur: N/A What is the patient wearing?: Pants, Ted Hose, Underwear, Non-skid slipper socks Position: Other (comment) (Tub Social worker) Underwear - Performed by patient: Thread/unthread right underwear leg, Thread/unthread left underwear leg Underwear - Performed by helper: Pull underwear up/down Pants- Performed by patient: Thread/unthread right pants leg, Thread/unthread left pants leg Pants- Performed by helper: Pull pants up/down Non-skid slipper socks- Performed by helper: Don/doff right sock, Don/doff left sock Socks - Performed by helper: Don/doff right sock, Don/doff left sock TED Hose - Performed by helper: Don/doff right TED hose, Don/doff left TED hose Assist for footwear: Dependant Assist for lower body dressing: Touching or steadying assistance (Pt > 75%)  Function - Toileting Toileting steps completed by patient: Adjust clothing prior to toileting, Performs perineal hygiene, Adjust clothing after toileting Toileting steps completed by helper: Adjust clothing prior to toileting, Performs perineal hygiene, Adjust clothing after toileting Toileting Assistive Devices: Other (comment) (RW) Assist level: Touching or steadying assistance (Pt.75%)  Function - Toilet Transfers Toilet transfer assistive device: Bedside commode, Walker Assist level to toilet: Moderate assist (Pt 50 - 74%/lift or lower) Assist level from toilet: Moderate assist (Pt 50 - 74%/lift or lower) Assist level to bedside commode (at bedside): Touching or steadying assistance (Pt > 75%) Assist level from bedside commode (at bedside): Moderate assist (Pt 50 - 74%/lift or lower)  Function - Chair/bed transfer Chair/bed transfer method: Ambulatory Chair/bed transfer assist level: Touching or steadying assistance (Pt > 75%) Chair/bed transfer assistive device: Walker, Armrests, Orthosis Chair/bed transfer details: Tactile cues for weight shifting, Manual facilitation for weight shifting, Verbal cues for technique  Function - Locomotion: Wheelchair Will patient use wheelchair at discharge?: No Function - Locomotion: Ambulation Assistive  device: Walker-rolling, Orthosis Max distance: 1000 Assist level: Supervision or verbal cues Assist level: Supervision or verbal cues Assist level: Supervision or verbal cues Walk 150 feet activity did not occur: Safety/medical concerns Assist level: Supervision or verbal cues Walk 10 feet on uneven surfaces activity did not occur: Safety/medical concerns Assist level: Supervision or verbal cues  Function - Comprehension Comprehension: Auditory Comprehension assist level: Follows complex conversation/direction with extra time/assistive device  Function - Expression Expression: Verbal Expression assist level: Expresses complex ideas: With extra time/assistive device  Function - Social Interaction Social Interaction assist level: Interacts appropriately with others - No medications needed.  Function - Problem Solving Problem solving assist level: Solves basic problems with no assist  Function - Memory Memory assist level: Recognizes or recalls 90% of the time/requires cueing < 10% of the time Patient normally able to recall (first 3 days only): Current season, Location of own room, Staff names and faces, That he or she is in a hospital   Medical Problem List and Plan: 1.  Decreased functional mobility secondary to lumbar stenosis with radiculopathy. Lumbar corset when out of bed, CIR PT, OT continue 2.  DVT Prophylaxis/Anticoagulation: SCDs. Check vascular studies upon admit.Doppler negative, LE swelling multifactorial venous insuff, paraparesis, add THigh hi TEDs 3. Pain Management: scheduled oxycodone and ultram---             -kpad   -robaxin effective last night but made her  too sleepy during day (with oxy)---use at HS prn only 4. Acute blood loss anemia. Follow-up CBC. Continue iron supplement, Hgb 9.2, down from 10.1 a week ago, will monitor, pt reluctant to take Fe--placed on niferex 5. Neuropsych: This patient is capable of making decisions on her own behalf. 6. Skin/Wound  Care: Routine skin checks. Drainage decreasing.    -keflex 7. Fluids/Electrolytes/Nutrition: sodium down to 130 again today (from 134)--  -check serially (tomorrow)  -I personally reviewed the patient's labs today.  8. Rheumatoid arthritis/polymyositis. Continue Plaquenil 200 mg daily, prednisone 5 mg daily 9. Hypertension. Lopressor 25 mg twice a day---stopped  -resumed maxzide.---continue electrolyte permitting 10.  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  Urinary inc  UA negative 12.  Constipation add senna 13.  HypoK on supplement for 3.4 today  -continue to monitor  14. Edema/HTN:  -resumed maxzide   LOS (Days) 7 A FACE TO FACE EVALUATION WAS PERFORMED  Christpher Stogsdill T 06/16/2016, 8:56 AM

## 2016-06-16 NOTE — Progress Notes (Signed)
Occupational Therapy Note  Patient Details  Name: [name] MRN: [id number] Date of Birth: [birth year]  Today's Date: 06/16/2016 OT Individual Time: 1000-1030 OT Individual Time Calculation (min): 30 min   Pt denied pain Individual therapy  Pt engaged in sit<>stand and functional amb with RW in room for simple home mgmt tasks.  Pt performed sit<>stand from elevated surface with close supervision. Focus on increase mobility to increase independence with BADLs.  Pt's son [name] present.    Leotis Shames [facility] 06/16/2016, 10:51 AM

## 2016-06-16 NOTE — Plan of Care (Signed)
Problem: RH Dressing Goal: LTG Patient will perform lower body dressing w/assist (OT) LTG: Patient will perform lower body dressing with assist, with/without cues in positioning using equipment (OT)  Goal downgraded due to pt progress. Pt's family able to provide needed assist. -AL  Problem: RH Simple Meal Prep Goal: LTG Patient will perform simple meal prep w/assist (OT) LTG: Patient will perform simple meal prep with assistance, with/without cues (OT).  Outcome: Not Applicable Date Met:  20/60/15 Goal d/c as pt will have 24 hour care at d/c and goal not a priority at this time. - Kelce Bouton Lewis, OTR/L  Problem: RH Laundry Goal: LTG Patient will perform laundry w/assist, cues (OT) LTG: Patient will perform laundry with assistance, with/without cues (OT).  Outcome: Not Applicable Date Met:  61/53/79 Goal d/c as pt will have 24 hour care at d/c and goal not a priority at this time. - Krystina Strieter Lewis, OTR/L  Problem: RH Tub/Shower Transfers Goal: LTG Patient will perform tub/shower transfers w/assist (OT) LTG: Patient will perform tub/shower transfers with assist, with/without cues using equipment (OT)  Goal downgraded due to pt progress. Pt's family able to provide needed assist. -AL

## 2016-06-16 NOTE — Plan of Care (Signed)
Problem: RH Car Transfers Goal: LTG Patient will perform car transfers with assist (PT) LTG: Patient will perform car transfers with assistance (PT).  Downgraded due to using a step to get into car. ABG

## 2016-06-16 NOTE — Progress Notes (Signed)
Occupational Therapy Weekly Progress Note  Patient Details  Name: [name] MRN: [id number] Date of Birth: 24-Feb-1946  Beginning of progress report period: June 09, 2016 End of progress report period: June 16, 2016  Today's Date: 06/16/2016 OT Individual Time: 1100-1200 and 1300-1400 OT Individual Time Calculation (min): 60 min and 60 min   Patient has met 5 of 5 short term goals.  Pt making good progress towards OT goals. She cont to require increased assist with sit <> stands from various heights, able to complete with supervision for higher sitting surface, however, requires mod-max for some sit <> stands.   Patient continues to demonstrate the following deficits: abnormal posture, muscle weakness (generalized) and pain in thoracic spine and therefore will continue to benefit from skilled OT intervention to enhance overall performance with BADL and Reduce care partner burden.  Patient progressing toward long term goals..  Plan of care revisions: Have discharged meal prep and laundry goal. Pt now plans to have 24 hr care at d/c who will assist with IADLs. .  OT Short Term Goals Week 1:  OT Short Term Goal 1 (Week 1): Pt will be SBA with grooming OT Short Term Goal 1 - Progress (Week 1): Met OT Short Term Goal 2 (Week 1): Pt will be bathing using DME as needed with min assist OT Short Term Goal 2 - Progress (Week 1): Met OT Short Term Goal 3 (Week 1): Pt will be dressing with min assist OT Short Term Goal 3 - Progress (Week 1): Met OT Short Term Goal 4 (Week 1): Pt will transfer to 3n1 with min assist OT Short Term Goal 4 - Progress (Week 1): Met OT Short Term Goal 5 (Week 1): Pt will go from sit to stand with mod assist with high seated toilet OT Short Term Goal 5 - Progress (Week 1): Met Week 2:  OT Short Term Goal 1 (Week 2): STG=LTG due to LOS  Skilled Therapeutic Interventions/Progress Updates:    Session One: Pt seen for OT session focusing on sit <> stand transfers from  various heights. Pt sitting EOB upon arrival, awaiting medication from RN. Voiced pain 4/10 in back, requires increased time for taking medication due to being very particular about way medication is administered.  She ambulated throughout session with RW and close supervision. In therapy gym completed sit <> stands from therapy mat of various heights. See below for details. Attempted use of push up blocks to assist with standing from mat for increased leverage, however, pt did not feel this was helpful. Originally attempted transfers with light weight tennis shoes on, however, pt wanted to change into "yoga socks". With yoga socks she demonstrated ability to stand with increased success. Pt returned to room at end of session, required increased assist to stand from [name] from 21" BSC. Discussed with pt's son to have home BSC height measured in order to ensure she can stand from [facility] prior to d/c.  Pt left seated EOB at end of session, set-up with lunch tray and all needs in reach with son present.   22 inches: Supervision (RW with increased time and VCs) 21.5 inches: Supervision (RW with increased time and VCs) 21.5 inches: Min A (RW with increased time and VCs) BSC (21 inches): Mod A at [name]  Session Two: Pt seen for OT session focusing on functional ambulation and activity tolerance. Pt sitting EOB upon arrival, finishing lunch and agreeable to tx session. She ambulated throughout unit and off unit outside with  RW. W/c brought along, however, pt declined opportunities for rest breaks, tolerating ~45 minutes of standing/ walking throughout session. Navigated through elevators and over uneven sidewalks/ brickwalk ways outside with SBA- CGA for uneven surfaces. Educated extensively throughout session regarding energy conservation, importance of rest breaks, fall risk, community mobility, spinal precautions, IADLs, and d/c planning.  Pt returned to room at end of session, left sitting on EOB with son present  and all needs in reach.    Therapy Documentation Precautions:  Precautions Precautions: Fall, Back Precaution Booklet Issued: No Precaution Comments: pt able to verbally recall 3/3 precautions. Needs cueing during functional activities. Required Braces or Orthoses: Spinal Brace Spinal Brace: Lumbar corset, Applied in sitting position Restrictions Weight Bearing Restrictions: No See Function Navigator for Current Functional Status.   Therapy/Group: Individual Therapy  Lewis, [name] 06/16/2016, 12:05 PM

## 2016-06-16 NOTE — Plan of Care (Signed)
Problem: RH Bed Mobility Goal: LTG Patient will perform bed mobility with assist (PT) LTG: Patient will perform bed mobility with assistance, with/without cues (PT).  Downgraded ABG  Problem: RH Bed to Chair Transfers Goal: LTG Patient will perform bed/chair transfers w/assist (PT) LTG: Patient will perform bed/chair transfers with assistance, with/without cues (PT).  Modified to elevated surfaces only mod I due to pt's home set-up.

## 2016-06-16 NOTE — Progress Notes (Signed)
Subjective: Patient reports "My target date to go home is next Friday. I'm doing better each day I think"  Objective: Vital signs in last 24 hours: Temp:  [98 F (36.7 C)-98.4 F (36.9 C)] 98.4 F (36.9 C) (06/30 0530) Pulse Rate:  [56-58] 58 (06/30 0530) Resp:  [18] 18 (06/30 0530) BP: (112-137)/(68-69) 112/69 mmHg (06/30 0530) SpO2:  [99 %-100 %] 99 % (06/30 0530)  Intake/Output from previous day: 06/29 0701 - 06/30 0700 In: 600 [P.O.:600] Out: -  Intake/Output this shift:    Alert, conversant, sitting on edge of bed eating cereal. Son present. BLE edema much improved today. Lumbar drsg with small amount serous drainage. No erythema. 1cm dehiscence with yellow slough unchanged, but decreasing drainage. Remains afebrile, wbc 5.5.  Working with therapies.  Lab Results:  Recent Labs  06/16/16 0533  WBC 5.5  HGB 9.5*  HCT 29.9*  PLT 439*   BMET  Recent Labs  06/16/16 0533  NA 130*  K 3.4*  CL 94*  CO2 29  GLUCOSE 93  BUN 7  CREATININE 0.62  CALCIUM 9.0    Studies/Results: No results found.  Assessment/Plan: Improving   LOS: 7 days  Continue Rehab, continuing drsg changes and Keflex.   [name] 06/16/2016, 10:08 AM

## 2016-06-17 ENCOUNTER — Inpatient Hospital Stay (HOSPITAL_COMMUNITY): Payer: Medicare HMO | Admitting: Occupational Therapy

## 2016-06-17 LAB — BASIC METABOLIC PANEL
ANION GAP: 7 (ref 5–15)
BUN: 8 mg/dL (ref 6–20)
CALCIUM: 9.2 mg/dL (ref 8.9–10.3)
CHLORIDE: 92 mmol/L — AB (ref 101–111)
CO2: 31 mmol/L (ref 22–32)
CREATININE: 0.53 mg/dL (ref 0.44–1.00)
GFR calc non Af Amer: >60 mL/min (ref >60)
Glucose, Bld: 86 mg/dL (ref 65–99)
Potassium: 4.1 mmol/L (ref 3.5–5.1)
SODIUM: 130 mmol/L — AB (ref 135–145)

## 2016-06-17 LAB — URINALYSIS, ROUTINE W REFLEX MICROSCOPIC
BILIRUBIN URINE: NEGATIVE
Glucose, UA: NEGATIVE mg/dL
Hgb urine dipstick: NEGATIVE
KETONES UR: NEGATIVE mg/dL
NITRITE: NEGATIVE
Protein, ur: NEGATIVE mg/dL
Specific Gravity, Urine: 1.022 (ref 1.005–1.030)
pH: 8 (ref 5.0–8.0)

## 2016-06-17 LAB — URINE MICROSCOPIC-ADD ON

## 2016-06-17 MED ORDER — SORBITOL 70 % SOLN
60.0000 mL | Status: AC
Start: 2016-06-17 — End: 2016-06-17
  Administered 2016-06-17: 60 mL via ORAL
  Filled 2016-06-17: qty 60

## 2016-06-17 NOTE — Progress Notes (Signed)
Occupational Therapy Session Note  Patient Details  Name: [name] MRN: [id number] Date of Birth: Oct 31, 1946  Today's Date: 06/17/2016 OT Individual Time: [phone] OT Individual Time Calculation (min): 60 min   Skilled Therapeutic Interventions/Progress Updates:upon approach for therapy patient sitting EOB with TLSO donned and complaining of pain and that her pain meds and not working and "I have a grabbing knot on my back on the left side."   Further she stated, "I cannot do anything with all this pain."  Dtr in law stated she has tried to 'press out the knot' and 'it seems to have helpe her be a little less painful.'     Patient stated she needed more meds for the pain.   She asked the nurse for Tylenol and later in the session patient stated she was experiencing some pain relief (10/10 down to 6/10 on pain scale)  Focus during most of session:  - clinician assisting with stretching rhomboids and trapezius to help address the tight muscles laterally left to patient's back surgical site  - Patient completed bed to 3:1 in bathroom toilet transfer via walker with Min A (patient with difficulty going sit to stand from 3:1 after toileting  - She was able to complete toileting adhering BAT back precautions  - [name] was able to demonstrate safe assist from 3:1 to stand at walker after toileting  Patient left sitting EOB with her son and dtr-in-law and granddtr at end of session with call bell in place     Therapy Documentation Precautions:  Precautions Precautions: Fall, Back Precaution Booklet Issued: No Precaution Comments: pt able to verbally recall 3/3 precautions. Needs cueing during functional activities. Required Braces or Orthoses: Spinal Brace Spinal Brace: Lumbar corset, Applied in sitting position Restrictions Weight Bearing Restrictions: No  Pain:10/10 in her left mid back just lateral left to surgical site "in that knot" and reduced to 6/10 1 hour 15 minutes after meds  and after this clinician applied manual muscle stretching to the area.         See Function Navigator for Current Functional Status.   Therapy/Group: Individual Therapy  [name] 06/17/2016, 4:54 PM

## 2016-06-17 NOTE — Progress Notes (Signed)
Subjective/Complaints: Had a good day with therapy. Walked farther. Low grade temp this morning. Denies cough.dysuria   ROS: Pt denies fever, rash/itching, headache, blurred or double vision, nausea, vomiting, abdominal pain, diarrhea, chest pain, shortness of breath, palpitations, dysuria, dizziness, , bleeding, anxiety, or depression    Objective: Vital Signs: Blood pressure 138/88, pulse 55, temperature 98.1 F (36.7 C), temperature source Oral, resp. rate 17, height '4\' 10"'  (1.473 m), weight 55.43 kg (122 lb 3.2 oz), SpO2 95 %. No results found. Results for orders placed or performed during the hospital encounter of 06/09/16 (from the past 72 hour(s))  CBC     Status: Abnormal   Collection Time: 06/16/16  5:33 AM  Result Value Ref Range   WBC 5.5 4.0 - 10.5 K/uL   RBC 3.40 (L) 3.87 - 5.11 MIL/uL   Hemoglobin 9.5 (L) 12.0 - 15.0 g/dL   HCT 29.9 (L) 36.0 - 46.0 %   MCV 87.9 78.0 - 100.0 fL   MCH 27.9 26.0 - 34.0 pg   MCHC 31.8 30.0 - 36.0 g/dL   RDW 15.5 11.5 - 15.5 %   Platelets 439 (H) 150 - 400 K/uL  Basic metabolic panel     Status: Abnormal   Collection Time: 06/16/16  5:33 AM  Result Value Ref Range   Sodium 130 (L) 135 - 145 mmol/L   Potassium 3.4 (L) 3.5 - 5.1 mmol/L   Chloride 94 (L) 101 - 111 mmol/L   CO2 29 22 - 32 mmol/L   Glucose, Bld 93 65 - 99 mg/dL   BUN 7 6 - 20 mg/dL   Creatinine, Ser 0.62 0.44 - 1.00 mg/dL   Calcium 9.0 8.9 - 10.3 mg/dL   GFR calc non Af Amer >60 >60 mL/min   GFR calc Af Amer >60 >60 mL/min    Comment: (NOTE) The eGFR has been calculated using the CKD EPI equation. This calculation has not been validated in all clinical situations. eGFR's persistently <60 mL/min signify possible Chronic Kidney Disease.    Anion gap 7 5 - 15  Basic metabolic panel     Status: Abnormal   Collection Time: 06/17/16  4:16 AM  Result Value Ref Range   Sodium 130 (L) 135 - 145 mmol/L   Potassium 4.1 3.5 - 5.1 mmol/L    Comment: DELTA CHECK NOTED   Chloride 92 (L) 101 - 111 mmol/L   CO2 31 22 - 32 mmol/L   Glucose, Bld 86 65 - 99 mg/dL   BUN 8 6 - 20 mg/dL   Creatinine, Ser 0.53 0.44 - 1.00 mg/dL   Calcium 9.2 8.9 - 10.3 mg/dL   GFR calc non Af Amer >60 >60 mL/min   GFR calc Af Amer >60 >60 mL/min    Comment: (NOTE) The eGFR has been calculated using the CKD EPI equation. This calculation has not been validated in all clinical situations. eGFR's persistently <60 mL/min signify possible Chronic Kidney Disease.    Anion gap 7 5 - 15     HEENT: normal Cardio: RRR Resp: CTA B/L. No rales or wheezes GI: BS positive and mild distention and tympany Extremity:  Pulses positive and 1+  pedal Edema Skin:   Wound dressed. Mild serous drainage at inferior aspect of wound. Wound with stable area of dehiscence . No odor or discoloration Neuro: Alert/Oriented and Abnormal Motor 4+ in BUE 3- BLE Musc/Skel:  Other limited lumbar an thoracic ROM Gen NAD   Assessment/Plan: 1. Functional deficits secondary to Lumbar  radiculopathy s/p extension of Lumbar fusion to include L5-S1 as well as T12-L2 ant and post approach which require 3+ hours per day of interdisciplinary therapy in a comprehensive inpatient rehab setting. Physiatrist is providing close team supervision and 24 hour management of active medical problems listed below. Physiatrist and rehab team continue to assess barriers to discharge/monitor patient progress toward functional and medical goals. FIM: Function - Bathing Bathing activity did not occur: N/A Position: Shower Body parts bathed by patient: Right arm, Left arm, Chest, Abdomen, Front perineal area, Buttocks, Right upper leg, Left upper leg, Right lower leg, Left lower leg, Back Body parts bathed by helper: Back Bathing not applicable: Front perineal area, Buttocks, Right upper leg, Left upper leg, Right lower leg, Left lower leg (Did not attempt secondary to pt felt she didn't need to wash them today.) Assist Level:  Supervision or verbal cues  Function- Upper Body Dressing/Undressing Upper body dressing/undressing activity did not occur: N/A What is the patient wearing?: Pull over shirt/dress Bra - Perfomed by patient: Thread/unthread right bra strap, Thread/unthread left bra strap, Hook/unhook bra (pull down sports bra) Pull over shirt/dress - Perfomed by patient: Thread/unthread right sleeve, Thread/unthread left sleeve, Put head through opening, Pull shirt over trunk Pull over shirt/dress - Perfomed by helper: Put head through opening, Pull shirt over trunk Assist Level: Set up Function - Lower Body Dressing/Undressing Lower body dressing/undressing activity did not occur: N/A What is the patient wearing?: Pants, Ted Hose, Underwear, Non-skid slipper socks Position: Other (comment) (Tub Producer, television/film/video) Underwear - Performed by patient: Thread/unthread right underwear leg, Thread/unthread left underwear leg Underwear - Performed by helper: Pull underwear up/down Pants- Performed by patient: Thread/unthread right pants leg, Thread/unthread left pants leg Pants- Performed by helper: Pull pants up/down Non-skid slipper socks- Performed by helper: Don/doff right sock, Don/doff left sock Socks - Performed by helper: Don/doff right sock, Don/doff left sock TED Hose - Performed by helper: Don/doff right TED hose, Don/doff left TED hose Assist for footwear: Dependant Assist for lower body dressing: Touching or steadying assistance (Pt > 75%)  Function - Toileting Toileting steps completed by patient: Adjust clothing prior to toileting, Performs perineal hygiene, Adjust clothing after toileting Toileting steps completed by helper: Adjust clothing prior to toileting, Performs perineal hygiene, Adjust clothing after toileting Toileting Assistive Devices: Grab bar or rail Assist level: Supervision or verbal cues  Function Midwife transfer assistive device: Bedside commode, Walker Assist  level to toilet: Moderate assist (Pt 50 - 74%/lift or lower) Assist level from toilet: Moderate assist (Pt 50 - 74%/lift or lower) Assist level to bedside commode (at bedside): Touching or steadying assistance (Pt > 75%) Assist level from bedside commode (at bedside): Moderate assist (Pt 50 - 74%/lift or lower)  Function - Chair/bed transfer Chair/bed transfer method: Ambulatory Chair/bed transfer assist level: Touching or steadying assistance (Pt > 75%) Chair/bed transfer assistive device: Walker, Armrests, Orthosis Chair/bed transfer details: Tactile cues for weight shifting, Manual facilitation for weight shifting, Verbal cues for technique  Function - Locomotion: Wheelchair Will patient use wheelchair at discharge?: No Function - Locomotion: Ambulation Assistive device: Walker-rolling, Orthosis Max distance: 175' Assist level: Supervision or verbal cues Assist level: Supervision or verbal cues Assist level: Supervision or verbal cues Walk 150 feet activity did not occur: Safety/medical concerns Assist level: Supervision or verbal cues Walk 10 feet on uneven surfaces activity did not occur: Safety/medical concerns Assist level: Supervision or verbal cues  Function - Comprehension Comprehension: Auditory Comprehension assist level: Follows  complex conversation/direction with extra time/assistive device  Function - Expression Expression: Verbal Expression assist level: Expresses complex ideas: With extra time/assistive device  Function - Social Interaction Social Interaction assist level: Interacts appropriately with others - No medications needed.  Function - Problem Solving Problem solving assist level: Solves basic problems with no assist  Function - Memory Memory assist level: Recognizes or recalls 90% of the time/requires cueing < 10% of the time Patient normally able to recall (first 3 days only): Current season   Medical Problem List and Plan: 1.  Decreased  functional mobility secondary to lumbar stenosis with radiculopathy. Lumbar corset when out of bed, CIR PT, OT continue. Pt making gains 2.  DVT Prophylaxis/Anticoagulation: SCDs. Check vascular studies upon admit.Doppler negative, LE swelling multifactorial venous insuff, paraparesis, add THigh hi TEDs 3. Pain Management: scheduled oxycodone and ultram---             -kpad needs to be used  -flexeril QHS prn 4. Acute blood loss anemia. Follow-up CBC. Continue iron supplement, Hgb 9.2. Fe++ supp 5. Neuropsych: This patient is capable of making decisions on her own behalf. 6. Skin/Wound Care: Routine skin checks. Drainage decreasing.    -keflex 7. Fluids/Electrolytes/Nutrition: sodium down to 130 again today-  -follow up Monday  -I personally reviewed the patient's labs today.  8. Rheumatoid arthritis/polymyositis. Continue Plaquenil 200 mg daily, prednisone 5 mg daily 9. Hypertension. Lopressor 25 mg twice a day---stopped  -resumed maxzide.---continue electrolyte permitting (sodium/potassium stable today) 10.  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  Urinary inc previous UA negative 12.  Constipation add senna 13.  HypoK on supplement for 4.1 today  -continue to monitor/supplement 14. Edema/HTN:  -resumed maxzide 15. Low grade temp: continue keflex for wound  -IS  -UA with Cx   LOS (Days) 8 A FACE TO FACE EVALUATION WAS PERFORMED  SWARTZ,ZACHARY T 06/17/2016, 9:30 AM

## 2016-06-18 ENCOUNTER — Inpatient Hospital Stay (HOSPITAL_COMMUNITY): Payer: Medicare HMO | Admitting: Occupational Therapy

## 2016-06-18 MED ORDER — SENNOSIDES-DOCUSATE SODIUM 8.6-50 MG PO TABS
2.0000 | ORAL_TABLET | Freq: Two times a day (BID) | ORAL | Status: DC
  Administered 2016-06-18 – 2016-06-19 (×2): 2 via ORAL
  Filled 2016-06-18 (×2): qty 2

## 2016-06-18 MED ORDER — MUSCLE RUB 10-15 % EX CREA
TOPICAL_CREAM | CUTANEOUS | Status: DC | PRN
  Administered 2016-06-18: 15:00:00 via TOPICAL
  Filled 2016-06-18 (×2): qty 85

## 2016-06-18 NOTE — Progress Notes (Signed)
Occupational Therapy Session Note  Patient Details  Name: [name] MRN: [id number] Date of Birth: 04-16-1946  Today's Date: 06/18/2016 OT Individual Time: 0700-0800 OT Individual Time Calculation (min): 60 min    Short Term Goals: Week 2:  OT Short Term Goal 1 (Week 2): STG=LTG due to LOS  Skilled Therapeutic Interventions/Progress Updates:    Pt seen for OT session focusing on LB dressing and AE training. Pt sitting EOB upon arrival with son present and agreeable to tx session.  Educated and demonstrated on use of reacher for threading LEs into underwear/ pants. She completed x3 trials of threading LEs into clothing, demonstrating more difficulty with pants vs. Underwear. She had difficulty manipulating reacher due to RA, however, with increased time and modified techniques pt able to thread B LEs into underwear. Educated regarding other positions including bringing knee onto EOB and/ or crossing ankle over knee, however, pt unable to lift LE onto bed or over ankle. VCs required throughout to maintain spinal pre-cautions during functional task.   She then ambulated throughout unit to nurses station for functional activity tolerance and balance. She required min-mod A to stand from EOB and Ambulated with close supervision- CGA. She returned to room and left sitting EOB set-up with meal tray, all needs in reach, and son present. Educated throughout session regarding OT goals, LE edema management including ankle pumps and donning lotion distal to proximal, spinal pre-cautions, and d/c planning.   Therapy Documentation Precautions:  Precautions Precautions: Fall, Back Precaution Booklet Issued: No Precaution Comments: pt able to verbally recall 3/3 precautions. Needs cueing during functional activities. Required Braces or Orthoses: Spinal Brace Spinal Brace: Lumbar corset, Applied in sitting position Restrictions Weight Bearing Restrictions: No Pain:  No/ denies pain  See Function  Navigator for Current Functional Status.   Therapy/Group: Individual Therapy  Lewis, Xzavian Semmel C 06/18/2016, 6:45 AM

## 2016-06-18 NOTE — Progress Notes (Signed)
Subjective/Complaints: Had a good day with therapy. Walked farther. Low grade temp this morning. Denies cough.dysuria   ROS: Pt denies fever, rash/itching, headache, blurred or double vision, nausea, vomiting, abdominal pain, diarrhea, chest pain, shortness of breath, palpitations, dysuria, dizziness, , bleeding, anxiety, or depression    Objective: Vital Signs: Blood pressure 123/65, pulse 66, temperature 97.9 F (36.6 C), temperature source Oral, resp. rate 18, height '4\' 10"'  (1.473 m), weight 55.43 kg (122 lb 3.2 oz), SpO2 100 %. No results found. Results for orders placed or performed during the hospital encounter of 06/09/16 (from the past 72 hour(s))  CBC     Status: Abnormal   Collection Time: 06/16/16  5:33 AM  Result Value Ref Range   WBC 5.5 4.0 - 10.5 K/uL   RBC 3.40 (L) 3.87 - 5.11 MIL/uL   Hemoglobin 9.5 (L) 12.0 - 15.0 g/dL   HCT 29.9 (L) 36.0 - 46.0 %   MCV 87.9 78.0 - 100.0 fL   MCH 27.9 26.0 - 34.0 pg   MCHC 31.8 30.0 - 36.0 g/dL   RDW 15.5 11.5 - 15.5 %   Platelets 439 (H) 150 - 400 K/uL  Basic metabolic panel     Status: Abnormal   Collection Time: 06/16/16  5:33 AM  Result Value Ref Range   Sodium 130 (L) 135 - 145 mmol/L   Potassium 3.4 (L) 3.5 - 5.1 mmol/L   Chloride 94 (L) 101 - 111 mmol/L   CO2 29 22 - 32 mmol/L   Glucose, Bld 93 65 - 99 mg/dL   BUN 7 6 - 20 mg/dL   Creatinine, Ser 0.62 0.44 - 1.00 mg/dL   Calcium 9.0 8.9 - 10.3 mg/dL   GFR calc non Af Amer >60 >60 mL/min   GFR calc Af Amer >60 >60 mL/min    Comment: (NOTE) The eGFR has been calculated using the CKD EPI equation. This calculation has not been validated in all clinical situations. eGFR's persistently <60 mL/min signify possible Chronic Kidney Disease.    Anion gap 7 5 - 15  Basic metabolic panel     Status: Abnormal   Collection Time: 06/17/16  4:16 AM  Result Value Ref Range   Sodium 130 (L) 135 - 145 mmol/L   Potassium 4.1 3.5 - 5.1 mmol/L    Comment: DELTA CHECK NOTED    Chloride 92 (L) 101 - 111 mmol/L   CO2 31 22 - 32 mmol/L   Glucose, Bld 86 65 - 99 mg/dL   BUN 8 6 - 20 mg/dL   Creatinine, Ser 0.53 0.44 - 1.00 mg/dL   Calcium 9.2 8.9 - 10.3 mg/dL   GFR calc non Af Amer >60 >60 mL/min   GFR calc Af Amer >60 >60 mL/min    Comment: (NOTE) The eGFR has been calculated using the CKD EPI equation. This calculation has not been validated in all clinical situations. eGFR's persistently <60 mL/min signify possible Chronic Kidney Disease.    Anion gap 7 5 - 15  Urinalysis, Routine w reflex microscopic (not at [facility])     Status: Abnormal   Collection Time: 06/17/16 11:11 AM  Result Value Ref Range   Color, Urine YELLOW YELLOW   APPearance CLOUDY (A) CLEAR   Specific Gravity, Urine 1.022 1.005 - 1.030   pH 8.0 5.0 - 8.0   Glucose, UA NEGATIVE NEGATIVE mg/dL   Hgb urine dipstick NEGATIVE NEGATIVE   Bilirubin Urine NEGATIVE NEGATIVE   Ketones, ur NEGATIVE NEGATIVE mg/dL  Protein, ur NEGATIVE NEGATIVE mg/dL   Nitrite NEGATIVE NEGATIVE   Leukocytes, UA LARGE (A) NEGATIVE  Urine microscopic-add on     Status: Abnormal   Collection Time: 06/17/16 11:11 AM  Result Value Ref Range   Squamous Epithelial / LPF 6-30 (A) NONE SEEN   WBC, UA TOO NUMEROUS TO COUNT 0 - 5 WBC/hpf   RBC / HPF 0-5 0 - 5 RBC/hpf   Bacteria, UA MANY (A) NONE SEEN     HEENT: normal Cardio: RRR Resp: CTA B/L. No rales or wheezes GI: BS positive and mild distention and tympany Extremity:  Pulses positive and 1+  pedal Edema Skin:   Wound dressed. Mild serous drainage at inferior aspect of wound. Wound with stable area of dehiscence . No odor or discoloration Neuro: Alert/Oriented and Abnormal Motor 4+ in BUE 3- BLE Musc/Skel:  Other limited lumbar an thoracic ROM Gen NAD   Assessment/Plan: 1. Functional deficits secondary to Lumbar radiculopathy s/p extension of Lumbar fusion to include L5-S1 as well as T12-L2 ant and post approach which require 3+ hours per day of  interdisciplinary therapy in a comprehensive inpatient rehab setting. Physiatrist is providing close team supervision and 24 hour management of active medical problems listed below. Physiatrist and rehab team continue to assess barriers to discharge/monitor patient progress toward functional and medical goals. FIM: Function - Bathing Bathing activity did not occur: N/A Position: Shower Body parts bathed by patient: Right arm, Left arm, Chest, Abdomen, Front perineal area, Buttocks, Right upper leg, Left upper leg, Right lower leg, Left lower leg, Back Body parts bathed by helper: Back Bathing not applicable: Front perineal area, Buttocks, Right upper leg, Left upper leg, Right lower leg, Left lower leg (Did not attempt secondary to pt felt she didn't need to wash them today.) Assist Level: Supervision or verbal cues  Function- Upper Body Dressing/Undressing Upper body dressing/undressing activity did not occur: N/A What is the patient wearing?: Pull over shirt/dress Bra - Perfomed by patient: Thread/unthread right bra strap, Thread/unthread left bra strap, Hook/unhook bra (pull down sports bra) Pull over shirt/dress - Perfomed by patient: Thread/unthread right sleeve, Thread/unthread left sleeve, Put head through opening, Pull shirt over trunk Pull over shirt/dress - Perfomed by helper: Put head through opening, Pull shirt over trunk Assist Level: Set up Function - Lower Body Dressing/Undressing Lower body dressing/undressing activity did not occur: N/A What is the patient wearing?: [employer], Socks, Underwear Position: Sitting EOB Underwear - Performed by patient: Thread/unthread right underwear leg, Thread/unthread left underwear leg (Simulated, not pulled up; completed with supervision) Underwear - Performed by helper: Pull underwear up/down Pants- Performed by patient: Thread/unthread right pants leg, Thread/unthread left pants leg Pants- Performed by helper: Pull pants up/down Non-skid  slipper socks- Performed by helper: Don/doff right sock, Don/doff left sock Socks - Performed by helper: Don/doff right sock, Don/doff left sock TED Hose - Performed by helper: Don/doff right TED hose, Don/doff left TED hose Assist for footwear: Maximal assist Assist for lower body dressing: Supervision or verbal cues  Function - Toileting Toileting steps completed by patient: Adjust clothing prior to toileting Toileting steps completed by helper: Adjust clothing prior to toileting, Performs perineal hygiene, Adjust clothing after toileting Toileting Assistive Devices: Grab bar or rail Assist level: Touching or steadying assistance (Pt.75%)  Function - Air cabin crew transfer assistive device: Bedside commode, Walker Assist level to toilet: Moderate assist (Pt 50 - 74%/lift or lower) Assist level from toilet: Moderate assist (Pt 50 - 74%/lift or lower)  Assist level to bedside commode (at bedside): Touching or steadying assistance (Pt > 75%) Assist level from bedside commode (at bedside): Moderate assist (Pt 50 - 74%/lift or lower)  Function - Chair/bed transfer Chair/bed transfer method: Ambulatory Chair/bed transfer assist level: Touching or steadying assistance (Pt > 75%) Chair/bed transfer assistive device: Walker, Armrests, Orthosis Chair/bed transfer details: Tactile cues for weight shifting, Manual facilitation for weight shifting, Verbal cues for technique  Function - Locomotion: Wheelchair Will patient use wheelchair at discharge?: No Function - Locomotion: Ambulation Assistive device: Walker-rolling, Orthosis Max distance: 175' Assist level: Supervision or verbal cues Assist level: Supervision or verbal cues Assist level: Supervision or verbal cues Walk 150 feet activity did not occur: Safety/medical concerns Assist level: Supervision or verbal cues Walk 10 feet on uneven surfaces activity did not occur: Safety/medical concerns Assist level: Supervision or verbal  cues  Function - Comprehension Comprehension: Auditory Comprehension assist level: Follows complex conversation/direction with no assist  Function - Expression Expression: Verbal Expression assist level: Expresses complex ideas: With no assist  Function - Social Interaction Social Interaction assist level: Interacts appropriately with others - No medications needed.  Function - Problem Solving Problem solving assist level: Solves complex problems: Recognizes & self-corrects  Function - Memory Memory assist level: Complete Independence: No helper Patient normally able to recall (first 3 days only): Current season   Medical Problem List and Plan: 1.  Decreased functional mobility secondary to lumbar stenosis with radiculopathy. Lumbar corset when out of bed, CIR PT, OT continue.   2.  DVT Prophylaxis/Anticoagulation: SCDs. Check vascular studies upon admit.Doppler negative, LE swelling multifactorial venous insuff, paraparesis, add THigh hi TEDs 3. Pain Management: scheduled oxycodone and ultram---             -kpad effective  -added muscle rub  -flexeril QHS prn 4. Acute blood loss anemia. Follow-up CBC. Continue iron supplement, Hgb 9.2. Fe++ supp 5. Neuropsych: This patient is capable of making decisions on her own behalf. 6. Skin/Wound Care: Routine skin checks. Drainage decreasing.    -keflex 7. Fluids/Electrolytes/Nutrition: sodium holding at 30  -follow up labs Monday  -I personally reviewed the patient's labs today.  8. Rheumatoid arthritis/polymyositis. Continue Plaquenil 200 mg daily, prednisone 5 mg daily 9. Hypertension. Lopressor 25 mg twice a day---stopped  -resumed maxzide.---continue electrolyte permitting (sodium/potassium stable today) 10.  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  Urinary inc---new UA +. cx pending 12.  Constipation--increase senna-s to two tabs bid 13.  HypoK on supplement for 4.1  today  -continue to monitor/supplement 14. Edema/HTN:  -resumed maxzide 15. Low grade temp: continue keflex for wound  -IS  -UA +, await cx before starting abx for urine   LOS (Days) 9 A FACE TO FACE EVALUATION WAS PERFORMED  Lynard Postlewait T 06/18/2016, 8:44 AM

## 2016-06-19 ENCOUNTER — Inpatient Hospital Stay (HOSPITAL_COMMUNITY): Payer: Medicare HMO | Admitting: Occupational Therapy

## 2016-06-19 ENCOUNTER — Inpatient Hospital Stay (HOSPITAL_COMMUNITY): Payer: Medicare HMO | Admitting: Physical Therapy

## 2016-06-19 LAB — URINE CULTURE: Culture: >=100000 — AB

## 2016-06-19 LAB — BASIC METABOLIC PANEL
Anion gap: 7 (ref 5–15)
BUN: 7 mg/dL (ref 6–20)
CHLORIDE: 93 mmol/L — AB (ref 101–111)
CO2: 26 mmol/L (ref 22–32)
CREATININE: 0.63 mg/dL (ref 0.44–1.00)
Calcium: 9 mg/dL (ref 8.9–10.3)
GFR calc Af Amer: >60 mL/min (ref >60)
GLUCOSE: 88 mg/dL (ref 65–99)
POTASSIUM: 3.7 mmol/L (ref 3.5–5.1)
SODIUM: 126 mmol/L — AB (ref 135–145)

## 2016-06-19 MED ORDER — DOCUSATE SODIUM 50 MG/5ML PO LIQD: 100.0000 mg | Freq: Two times a day (BID) | ORAL | Status: DC

## 2016-06-19 MED ORDER — CIPROFLOXACIN 500 MG/5ML (10%) PO SUSR
250.0000 mg | Freq: Two times a day (BID) | ORAL | Status: DC
  Administered 2016-06-19 – 2016-06-22 (×6): 250 mg via ORAL
  Filled 2016-06-19 (×6): qty 2.5

## 2016-06-19 MED ORDER — DOCUSATE SODIUM 50 MG/5ML PO LIQD
100.0000 mg | Freq: Two times a day (BID) | ORAL | Status: DC
  Administered 2016-06-19 – 2016-06-20 (×2): 100 mg via ORAL
  Filled 2016-06-19 (×5): qty 10

## 2016-06-19 MED ORDER — SENNOSIDES 8.8 MG/5ML PO SYRP
10.0000 mL | ORAL_SOLUTION | Freq: Two times a day (BID) | ORAL | Status: DC
  Administered 2016-06-19 – 2016-06-20 (×2): 10 mL via ORAL
  Filled 2016-06-19 (×6): qty 10

## 2016-06-19 MED ORDER — CIPROFLOXACIN HCL 500 MG PO TABS
250.0000 mg | ORAL_TABLET | Freq: Two times a day (BID) | ORAL | Status: DC
  Administered 2016-06-19: 250 mg via ORAL
  Filled 2016-06-19: qty 1

## 2016-06-19 MED ORDER — SENNOSIDES 8.8 MG/5ML PO SYRP
10.0000 mL | ORAL_SOLUTION | Freq: Two times a day (BID) | ORAL | Status: DC
  Filled 2016-06-19: qty 10

## 2016-06-19 NOTE — Progress Notes (Addendum)
Subjective/Complaints: Knot better in left back. Feels that she's making progress   ROS: Pt denies fever, rash/itching, headache, blurred or double vision, nausea, vomiting, abdominal pain, diarrhea, chest pain, shortness of breath, palpitations, dysuria, dizziness, , bleeding, anxiety, or depression    Objective: Vital Signs: Blood pressure 123/55, pulse 63, temperature 97.9 F (36.6 C), temperature source Oral, resp. rate 17, height '4\' 10"'  (1.473 m), weight 55.43 kg (122 lb 3.2 oz), SpO2 100 %. No results found. Results for orders placed or performed during the hospital encounter of 06/09/16 (from the past 72 hour(s))  Basic metabolic panel     Status: Abnormal   Collection Time: 06/17/16  4:16 AM  Result Value Ref Range   Sodium 130 (L) 135 - 145 mmol/L   Potassium 4.1 3.5 - 5.1 mmol/L    Comment: DELTA CHECK NOTED   Chloride 92 (L) 101 - 111 mmol/L   CO2 31 22 - 32 mmol/L   Glucose, Bld 86 65 - 99 mg/dL   BUN 8 6 - 20 mg/dL   Creatinine, Ser 0.53 0.44 - 1.00 mg/dL   Calcium 9.2 8.9 - 10.3 mg/dL   GFR calc non Af Amer >60 >60 mL/min   GFR calc Af Amer >60 >60 mL/min    Comment: (NOTE) The eGFR has been calculated using the CKD EPI equation. This calculation has not been validated in all clinical situations. eGFR's persistently <60 mL/min signify possible Chronic Kidney Disease.    Anion gap 7 5 - 15  Urinalysis, Routine w reflex microscopic (not at [facility])     Status: Abnormal   Collection Time: 06/17/16 11:11 AM  Result Value Ref Range   Color, Urine YELLOW YELLOW   APPearance CLOUDY (A) CLEAR   Specific Gravity, Urine 1.022 1.005 - 1.030   pH 8.0 5.0 - 8.0   Glucose, UA NEGATIVE NEGATIVE mg/dL   Hgb urine dipstick NEGATIVE NEGATIVE   Bilirubin Urine NEGATIVE NEGATIVE   Ketones, ur NEGATIVE NEGATIVE mg/dL   Protein, ur NEGATIVE NEGATIVE mg/dL   Nitrite NEGATIVE NEGATIVE   Leukocytes, UA LARGE (A) NEGATIVE  Urine microscopic-add on     Status: Abnormal   Collection Time: 06/17/16 11:11 AM  Result Value Ref Range   Squamous Epithelial / LPF 6-30 (A) NONE SEEN   WBC, UA TOO NUMEROUS TO COUNT 0 - 5 WBC/hpf   RBC / HPF 0-5 0 - 5 RBC/hpf   Bacteria, UA MANY (A) NONE SEEN  Urine culture     Status: Abnormal   Collection Time: 06/17/16 11:12 AM  Result Value Ref Range   Specimen Description URINE, RANDOM    Special Requests NONE    Culture >=100,000 COLONIES/mL PSEUDOMONAS AERUGINOSA (A)    Report Status 06/19/2016 FINAL    Organism ID, Bacteria PSEUDOMONAS AERUGINOSA (A)       Susceptibility   Pseudomonas aeruginosa - MIC*    CEFTAZIDIME 4 SENSITIVE Sensitive     CIPROFLOXACIN <=0.25 SENSITIVE Sensitive     GENTAMICIN <=1 SENSITIVE Sensitive     IMIPENEM 2 SENSITIVE Sensitive     PIP/TAZO <=4 SENSITIVE Sensitive     CEFEPIME 2 SENSITIVE Sensitive     * >=100,000 COLONIES/mL PSEUDOMONAS AERUGINOSA  Basic metabolic panel     Status: Abnormal   Collection Time: 06/19/16  5:38 AM  Result Value Ref Range   Sodium 126 (L) 135 - 145 mmol/L   Potassium 3.7 3.5 - 5.1 mmol/L   Chloride 93 (L) 101 - 111 mmol/L  CO2 26 22 - 32 mmol/L   Glucose, Bld 88 65 - 99 mg/dL   BUN 7 6 - 20 mg/dL   Creatinine, Ser 0.63 0.44 - 1.00 mg/dL   Calcium 9.0 8.9 - 10.3 mg/dL   GFR calc non Af Amer >60 >60 mL/min   GFR calc Af Amer >60 >60 mL/min    Comment: (NOTE) The eGFR has been calculated using the CKD EPI equation. This calculation has not been validated in all clinical situations. eGFR's persistently <60 mL/min signify possible Chronic Kidney Disease.    Anion gap 7 5 - 15     HEENT: normal Cardio: RRR Resp: CTA B/L. No rales or wheezes GI: BS positive and mild distention and tympany Extremity:  Pulses positive and 1+  pedal Edema Skin:   Wound dressed. Mild serous drainage at inferior aspect of wound. Wound filling in. Dry.  No odor or discoloration Neuro: Alert/Oriented and Abnormal Motor 4+ in BUE 3- BLE Musc/Skel:  Small TP in left thoracic  paraspinal below scapula Gen NAD   Assessment/Plan: 1. Functional deficits secondary to Lumbar radiculopathy s/p extension of Lumbar fusion to include L5-S1 as well as T12-L2 ant and post approach which require 3+ hours per day of interdisciplinary therapy in a comprehensive inpatient rehab setting. Physiatrist is providing close team supervision and 24 hour management of active medical problems listed below. Physiatrist and rehab team continue to assess barriers to discharge/monitor patient progress toward functional and medical goals. FIM: Function - Bathing Bathing activity did not occur: N/A Position: Shower Body parts bathed by patient: Right arm, Left arm, Chest, Abdomen, Front perineal area, Buttocks, Right upper leg, Left upper leg, Right lower leg, Left lower leg, Back Body parts bathed by helper: Back Bathing not applicable: Front perineal area, Buttocks, Right upper leg, Left upper leg, Right lower leg, Left lower leg (Did not attempt secondary to pt felt she didn't need to wash them today.) Assist Level: Supervision or verbal cues  Function- Upper Body Dressing/Undressing Upper body dressing/undressing activity did not occur: N/A What is the patient wearing?: Pull over shirt/dress Bra - Perfomed by patient: Thread/unthread right bra strap, Thread/unthread left bra strap, Hook/unhook bra (pull down sports bra) Pull over shirt/dress - Perfomed by patient: Thread/unthread right sleeve, Thread/unthread left sleeve, Put head through opening, Pull shirt over trunk Pull over shirt/dress - Perfomed by helper: Put head through opening, Pull shirt over trunk Assist Level: Set up Function - Lower Body Dressing/Undressing Lower body dressing/undressing activity did not occur: N/A What is the patient wearing?: [employer], Socks, Underwear Position: Sitting EOB Underwear - Performed by patient: Thread/unthread right underwear leg, Thread/unthread left underwear leg (Simulated, not pulled up;  completed with supervision) Underwear - Performed by helper: Pull underwear up/down Pants- Performed by patient: Thread/unthread right pants leg, Thread/unthread left pants leg Pants- Performed by helper: Pull pants up/down Non-skid slipper socks- Performed by helper: Don/doff right sock, Don/doff left sock Socks - Performed by helper: Don/doff right sock, Don/doff left sock TED Hose - Performed by helper: Don/doff right TED hose, Don/doff left TED hose Assist for footwear: Maximal assist Assist for lower body dressing: Supervision or verbal cues  Function - Toileting Toileting steps completed by patient: Adjust clothing prior to toileting, Adjust clothing after toileting, Performs perineal hygiene Toileting steps completed by helper: Adjust clothing after toileting Toileting Assistive Devices: Grab bar or rail Assist level: Touching or steadying assistance (Pt.75%)  Function - Air cabin crew transfer assistive device: Elevated toilet seat/BSC over toilet  Assist level to toilet: Touching or steadying assistance (Pt > 75%) Assist level from toilet: Touching or steadying assistance (Pt > 75%) Assist level to bedside commode (at bedside): Touching or steadying assistance (Pt > 75%) Assist level from bedside commode (at bedside): Moderate assist (Pt 50 - 74%/lift or lower)  Function - Chair/bed transfer Chair/bed transfer method: Ambulatory Chair/bed transfer assist level: Touching or steadying assistance (Pt > 75%) Chair/bed transfer assistive device: Walker, Armrests, Orthosis Chair/bed transfer details: Tactile cues for weight shifting, Manual facilitation for weight shifting, Verbal cues for technique  Function - Locomotion: Wheelchair Will patient use wheelchair at discharge?: No Function - Locomotion: Ambulation Assistive device: Walker-rolling, Orthosis Max distance: 175' Assist level: Supervision or verbal cues Assist level: Supervision or verbal cues Assist level:  Supervision or verbal cues Walk 150 feet activity did not occur: Safety/medical concerns Assist level: Supervision or verbal cues Walk 10 feet on uneven surfaces activity did not occur: Safety/medical concerns Assist level: Supervision or verbal cues  Function - Comprehension Comprehension: Auditory Comprehension assist level: Follows complex conversation/direction with no assist  Function - Expression Expression: Verbal Expression assist level: Expresses complex ideas: With no assist  Function - Social Interaction Social Interaction assist level: Interacts appropriately with others - No medications needed.  Function - Problem Solving Problem solving assist level: Solves complex problems: Recognizes & self-corrects  Function - Memory Memory assist level: Complete Independence: No helper Patient normally able to recall (first 3 days only): Current season   Medical Problem List and Plan: 1.  Decreased functional mobility secondary to lumbar stenosis with radiculopathy. Lumbar corset when out of bed, CIR PT, OT continue.   2.  DVT Prophylaxis/Anticoagulation: SCDs. Check vascular studies upon admit.Doppler negative, LE swelling multifactorial venous insuff, paraparesis, add THigh hi TEDs 3. Pain Management: scheduled oxycodone and ultram---             -kpad effective  -added muscle rub  -flexeril QHS prn 4. Acute blood loss anemia. Follow-up CBC. Continue iron supplement, Hgb 9.2. Fe++ supp 5. Neuropsych: This patient is capable of making decisions on her own behalf. 6. Skin/Wound Care: Routine skin checks. Drainage decreasing.    -keflex 7. Fluids/Electrolytes/Nutrition: sodium down to 126  -dc maxzide. Follow labs serially  -I personally reviewed the patient's labs today.  8. Rheumatoid arthritis/polymyositis. Continue Plaquenil 200 mg daily, prednisone 5 mg daily 9. Hypertension. Lopressor 25 mg twice a day---stopped  -resumed maxzide.---continue electrolyte permitting  (sodium/potassium stable today) 10.  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  Urinary inc---rx UTI 12.  Constipation--increase senna-s to two tabs bid 13.  HypoK on supplement for 4.1 today  -continue to monitor/supplement 14. Edema/HTN:  -stopped maxzide. Elevate legs/ TEDS 15. Low grade temp: UCX positive for pseudomonas  -change keflex to cipro for 7 day course   LOS (Days) 10 A FACE TO FACE EVALUATION WAS PERFORMED  SWARTZ,ZACHARY T 06/19/2016, 8:34 AM

## 2016-06-19 NOTE — Progress Notes (Signed)
Physical Therapy Session Note  Patient Details  Name: [name] MRN: [id number] Date of Birth: Apr 18, 1946  Today's Date: 06/19/2016 PT Individual Time: 6010-9323 PT Individual Time Calculation (min): 55 min   Short Term Goals: Week 2:  PT Short Term Goal 1 (Week 2): = LTGs  Skilled Therapeutic Interventions/Progress Updates:    Pt received in bathroom, awaiting therapist, agreeable to therapy session.  Session focus on assessment of ambulation with rollator, transfers from a variety of surfaces, and strengthening.  Pt transfers sit<>stand with mod assist from elevated toilet seat, amb to sink and performs hand hygiene with supervision, and amb to bed to sit EOB.    PT assessed rollator and provided pt education on challenges associated with rollator as well as locking brakes to make it more steady.  Pt transitioned sit<>stand from edge of bed, using backs of legs against bed to power up, with supervision.  Amb >500' with rollator and supervision overall.  Pt amb with brakes locked and unlocked, ultimately reports she feels more steady with the regular RW.  Discussed initially using RW at home and practicing with rollator with family or HHPT to gain confidence.  Pt verbalized understanding.   PT instructed pt in 15 reps minisquats with increased time and tactile cues for proper form, and 10 reps of glute squeezes in sitting.  Pt demos poor isolation of glutes with glute squeeze and would benefit from further practice.    Sit<>stand from edge of therapy mat with mod assist to rise.  Pt amb back to room with supervision and rollator and positioned sitting EOB with family in room, call bell in reach and needs met.   Therapy Documentation Precautions:  Precautions Precautions: Fall, Back Precaution Booklet Issued: No Precaution Comments: pt able to verbally recall 3/3 precautions. Needs cueing during functional activities. Required Braces or Orthoses: Spinal Brace Spinal Brace: Lumbar  corset, Applied in sitting position Restrictions Weight Bearing Restrictions: No   See Function Navigator for Current Functional Status.   Therapy/Group: Individual Therapy  Rashad Auld E Penven-Crew 06/19/2016, 5:09 PM

## 2016-06-19 NOTE — Progress Notes (Signed)
Physical Therapy Session Note  Patient Details  Name: [name] MRN: [id number] Date of Birth: 02-07-1946  Today's Date: 06/19/2016 PT Individual Time: 1500-1535 PT Individual Time Calculation (min): 35 min   Short Term Goals: Week 2:  PT Short Term Goal 1 (Week 2): = LTGs  Skilled Therapeutic Interventions/Progress Updates:    Pt received seated on EOB with pain as described below and agreeable to treatment. Sit <>stand from elevated bed with bedrails and RW with S. Gait 2x175' with RW and S to gym. Alternating step-ups to 3" step with BUE support and min guard; demonstrates straight leg step up d/t report of buckling "if my knee bends even a little bit", however does demonstrate decent eccentric control during step down. Discussed with pt importance of goal setting, trying more difficult tasks that she thinks she can't do when she is working with trained professionals who can appropriately progress activity and guard her to prevent falls. Pt expressed interest in working out in a gym when she is doing better from this surgery; recommended pt discuss with her OP therapist as she prepares to d/c from [place]. Returned to room as above; remained seated on [facility] over toilet with instruction to use call bell if necessary before next therapist arrive; pt agreeable and next therapist informed of pt position.   Therapy Documentation Precautions:  Precautions Precautions: Fall, Back Precaution Booklet Issued: No Precaution Comments: pt able to verbally recall 3/3 precautions. Needs cueing during functional activities. Required Braces or Orthoses: Spinal Brace Spinal Brace: Lumbar corset, Applied in sitting position Restrictions Weight Bearing Restrictions: No Pain: Pain Assessment Pain Assessment: 0-10 Pain Score: 3  Pain Type: Acute pain Pain Location: Back Pain Orientation: Lower Pain Descriptors / Indicators: Aching Pain Frequency: Constant Pain Onset: On-going Patients Stated Pain  Goal: 3 Pain Intervention(s): Medication (See eMAR)   See Function Navigator for Current Functional Status.   Therapy/Group: Individual Therapy  [name] 06/19/2016, 3:40 PM

## 2016-06-19 NOTE — Progress Notes (Signed)
Subjective: Patient reports "I'm feeling good...my swelling is back some"  Objective: Vital signs in last 24 hours: Temp:  [97.7 F (36.5 C)-97.9 F (36.6 C)] 97.9 F (36.6 C) (07/03 0551) Pulse Rate:  [63-79] 63 (07/03 0551) Resp:  [17-20] 17 (07/03 0551) BP: (123-145)/(55-59) 123/55 mmHg (07/03 0551) SpO2:  [100 %] 100 % (07/03 0551)  Intake/Output from previous day: 07/02 0701 - 07/03 0700 In: 720 [P.O.:720] Out: -  Intake/Output this shift: Total I/O In: 240 [P.O.:240] Out: -   Alert, eating lunch. Incision without erythema. Some thin drainage persists. Drsg changed this morning now with dime size area of greenish drainage.   Lab Results: No results for input(s): WBC, HGB, HCT, PLT in the last 72 hours. BMET  Recent Labs  06/17/16 0416 06/19/16 0538  NA 130* 126*  K 4.1 3.7  CL 92* 93*  CO2 31 26  GLUCOSE 86 88  BUN 8 7  CREATININE 0.53 0.63  CALCIUM 9.2 9.0    Studies/Results: No results found.  Assessment/Plan: Improving   LOS: 10 days  Continue Rehab.    Verdis Prime 06/19/2016, 12:26 PM

## 2016-06-19 NOTE — Progress Notes (Signed)
Occupational Therapy Session Note  Patient Details  Name: [name] MRN: [id number] Date of Birth: October 28, 1946  Today's Date: 06/19/2016 OT Individual Time: 0900-1030 and 1300-1400 OT Individual Time Calculation (min): 90 min and 60 min   Short Term Goals: Week 2:  OT Short Term Goal 1 (Week 2): STG=LTG due to LOS  Skilled Therapeutic Interventions/Progress Updates:    Session One: Pt seen for OT ADL bathing/dressing session. Pt sitting EOB upon arrival with son present and agreeable to tx session.  During session discussed with pt and son possibility of moving up pt's d/c date. Currently, pt requires min-mod A for sit <> stand from standard/ low seating surface, however, is supervision- mod I from elevated surfaces. All seating surfaces in her home are elevated and feel that with 24 hr assist from her sons (who are already planning to be with pt at d/c) she will be able to function at supervision- mod I level at home and pt's sons able to assist PRN. Pt and son voiced understanding and agreement, will follow up with rest of tx team.  Throughout session ambulated throughout unit with supervision. She required min A to stand from [facility], an improvement from previous sessions.  She completed showering task in ADL apartment utilizing tub transfer bench, requiring assist to bring B LEs over tub wall, pt with increased edema in B LEs impacting her ability to lift legs over tub wall. She bathed with supervision, completing lateral leans to complete buttock hygiene.  She dressed seated on tub bench utilizing reacher to thread underwear on.  She sat in standard firm chair in ADL apartment, able to stand from standard chair with min A utilizing chair arms, pt very excited about this progress. She returned to room and completed x5 sit <> stands from EOB set at various heights, pt inconsistent in performance, requiring supervision- mod A. Pt left sitting EOB at end of session, all needs in reach and son  present.   Session Two: Pt seen for OT session focusing on kitchen mobility and simple meal prep.  She ambulated throughout unit and in rehab kitchen with supervision using RW. She required VCs throughout session for RW management during functional tasks. She made bowl of grits, utilizing stove. Education provided throughout session regarding kitchen layout including placing commonly used items on cabinet level, and problem solving  transporting items while managing RW. Practiced obtaining items from refrigerator, overhead and low cabinets with VCs for safety awareness and problem solving.  Pt stating she plans to use personal rollator type RW at d/c. Educated extensively regarding recommendation for 2 wheel RW she has been using while on rehab and safety implications of using other ADs. Pt voiced understanding, however, will cont to benefit from cont education and encouragement for RW use and safety at d/c.  Pt returned to room at end of session, left sitting EOB, all needs in reach.    Therapy Documentation Precautions:  Precautions Precautions: Fall, Back Precaution Booklet Issued: No Precaution Comments: pt able to verbally recall 3/3 precautions. Needs cueing during functional activities. Required Braces or Orthoses: Spinal Brace Spinal Brace: Lumbar corset, Applied in sitting position Restrictions Weight Bearing Restrictions: No Pain: Pain Assessment Pain Assessment: 0-10 Pain Score: 5  Pain Type: Acute pain Pain Location: Back Pain Orientation: Lower Pain Descriptors / Indicators: Aching Pain Frequency: Constant Pain Onset: On-going Patients Stated Pain Goal: 3 Pain Intervention(s): Medication (See eMAR) (scheduled)  See Function Navigator for Current Functional Status.   Therapy/Group: Individual Therapy  Lewis, Trasean Delima C 06/19/2016, 12:08 PM

## 2016-06-19 NOTE — Progress Notes (Signed)
Patient ID: [name], female   DOB: Feb 14, 1946, 70 y.o.   MRN: [id number] Pt out of room / not in gym.  Will revisit later.   Verdis Prime RN BSN

## 2016-06-20 ENCOUNTER — Inpatient Hospital Stay (HOSPITAL_COMMUNITY): Payer: Medicare HMO

## 2016-06-20 ENCOUNTER — Inpatient Hospital Stay (HOSPITAL_COMMUNITY): Payer: Medicare HMO | Admitting: Occupational Therapy

## 2016-06-20 LAB — BASIC METABOLIC PANEL
ANION GAP: 7 (ref 5–15)
BUN: 7 mg/dL (ref 6–20)
CHLORIDE: 96 mmol/L — AB (ref 101–111)
CO2: 28 mmol/L (ref 22–32)
Calcium: 9.1 mg/dL (ref 8.9–10.3)
Creatinine, Ser: 0.62 mg/dL (ref 0.44–1.00)
GFR calc Af Amer: >60 mL/min (ref >60)
Glucose, Bld: 84 mg/dL (ref 65–99)
POTASSIUM: 3.8 mmol/L (ref 3.5–5.1)
SODIUM: 131 mmol/L — AB (ref 135–145)

## 2016-06-20 MED ORDER — SORBITOL 70 % SOLN: 60.0000 mL | Freq: Every day | Status: DC | PRN

## 2016-06-20 MED ORDER — CYCLOBENZAPRINE HCL 5 MG PO TABS
5.0000 mg | ORAL_TABLET | Freq: Three times a day (TID) | ORAL | Status: DC | PRN
  Administered 2016-06-20 – 2016-06-22 (×5): 5 mg via ORAL
  Filled 2016-06-20 (×5): qty 1

## 2016-06-20 NOTE — Progress Notes (Signed)
Occupational Therapy Session Note  Patient Details  Name: [name] MRN: [id number] Date of Birth: [birth year]  Today's Date: 06/20/2016 OT Individual Time: 0900-1000 and 1030-1100 OT Individual Time Calculation (min): 60 min and 30 min    Short Term Goals: Week 2:  OT Short Term Goal 1 (Week 2): STG=LTG due to LOS  Skilled Therapeutic Interventions/Progress Updates:    Pt seen for OT session focusing on LE strengthening and endurance. Pt sitting EOB upon arrival with son present. She voiced need for scheduled medication, RN aware and administered medication.   During sit <> stand, pt will adjust height of hospital for sitting and then adjust again to stand. Stressed to pt the importance of establishing one height and completing all tasks from that surface height as pt unable to adjust height of bed at home. Pt's sons have not returned home measurement sheet for height of home bed. Established pt able to comfortable sit <> stand from 24.25 inches, bed set at this height and sign posted above bed for staff not to adjust bed height. She ambulated to therapy gym with supervision using RW. She completed 13 minutes on NuStep focusing on LE strengthening. Work level set at Level 1, and pt unable to extend L LE from flexed position without using UE to initiate movement to push machine into extension. R LE with inward rotation during movements as compensatory movement, therapist facilitated with maintaining R LE in neutral position while she was able to complete full ROM using only LE strength. She ambulated back to room at end of session, left with all needs in reach.   Session Two: Pt seen for OT session focusing on family education and functional transfers. Pt sitting EOB upon arrival with son present. Voiced being pre-medicated prior to tx session, feeling a little "loopy" from medications, but ready for tx session. She ambulated to ADL apartment and completed simulated tub/shower transfer  utilizing tub bench. Educated son regarding proper set-up and assembly of tub bench as well as AE and DME for bathroom/ showering needs. She then ambulated to therapy gym and focused on sit <> stand from highly elevated mat without use of UE for quad and LE strengthening. Stressed importance of only doing this for therapeutic reasons and not to attempt at home or with family. Completed x2 with min-mod A and VCs for technique. Pt left sitting EOM in gym in prep for hand off to next therapist.   Therapy Documentation Precautions:  Precautions Precautions: Fall, Back Precaution Booklet Issued: No Precaution Comments: pt able to verbally recall 3/3 precautions. Needs cueing during functional activities. Required Braces or Orthoses: Spinal Brace Spinal Brace: Lumbar corset, Applied in sitting position Restrictions Weight Bearing Restrictions: No Pain: Pain Assessment Pain Assessment: 0-10 Pain Score: 2  Pain Type: Acute pain Pain Location: Back Pain Orientation: Lower Pain Descriptors / Indicators: Aching Pain Onset: On-going Patients Stated Pain Goal: 2 Pain Intervention(s): Pre-medicated  See Function Navigator for Current Functional Status.   Therapy/Group: Individual Therapy  Lewis, Faith Patricelli C 06/20/2016, 6:43 AM

## 2016-06-20 NOTE — Plan of Care (Signed)
Problem: SCI BOWEL ELIMINATION Goal: RH STG MANAGE BOWEL WITH ASSISTANCE STG Manage Bowel with Assistance. Mod I  Outcome: Not Progressing MOM given today to promote BM- will try sorbitol if no results

## 2016-06-20 NOTE — Progress Notes (Signed)
Physical Therapy Discharge Summary  Patient Details  Name: [name] MRN: [id number] Date of Birth: [birth year]  Patient has met 6 of 6 long term goals due to improved activity tolerance, improved balance, increased strength, decreased pain, ability to compensate for deficits and functional use of  right lower extremity and left lower extremity.  Patient to discharge at an ambulatory level supervision to modified independent with RW and/or rollator..   Patient's family is independent to provide the necessary physical assistance at discharge. Pt is modified independent with transfers from elevated surfaces but requires up to mod assist when coming from lower surface. Pt already set-up at home with elevated surfaces and family able to provide assist when needed from lower surfaces. Family education successfully completed.   Reasons goals not met: n/a  Recommendation:  Patient will benefit from ongoing skilled PT services in home health setting to continue to advance safe functional mobility, address ongoing impairments in gait, balance, strength, endurance, pain, and minimize fall risk.  Equipment: No equipment provided. Pt already owns several rollators and a RW.  Reasons for discharge: treatment goals met and discharge from hospital  Patient/family agrees with progress made and goals achieved: Yes  PT Discharge Precautions/Restrictions- fall/spinal      Cognition- a and O x 4   Sensation Sensation Light Touch: Appears Intact Stereognosis: Appears Intact Hot/Cold: Appears Intact Proprioception: Appears Intact Coordination Gross Motor Movements are Fluid and Coordinated: Yes Fine Motor Movements are Fluid and Coordinated: Yes Heel Shin Test: NT Motor  Motor Motor: Abnormal postural alignment and control  Mobility Bed Mobility Bed Mobility: Supine to Sit;Sit to Supine Supine to Sit: 6: Modified independent (Device/Increase time) Sit to Supine: 4: Min assist Sit to Supine  - Details: Manual facilitation for placement Sit to Supine - Details (indicate cue type and reason): assistance for LLE onto bed/mat Transfers Transfers: Yes Sit to Stand: 6: Modified independent (Device/Increase time) Stand to Sit: 6: Modified independent (Device/Increase time) Stand Pivot Transfers: 6: Modified independent (Device/Increase time) Locomotion  Ambulation Ambulation: Yes Ambulation/Gait Assistance: 6: Modified independent (Device/Increase time) Ambulation Distance (Feet): 170 Feet Assistive device: Rolling walker Gait Gait: Yes Gait Pattern: Impaired Gait Pattern: Trunk flexed;Step-through pattern;Decreased step length - left;Decreased step length - right;Decreased dorsiflexion - right;Decreased dorsiflexion - left;Decreased trunk rotation Gait velocity: 0.86'/sec Stairs / Additional Locomotion Stairs: Yes Stairs Assistance: 5: Supervision Stair Management Technique: Two rails Number of Stairs: 8 Height of Stairs: 3 Curb: 3: Mod assist;4: Min Administrator Mobility: No  Trunk/Postural Assessment  Cervical Assessment Cervical Assessment: Within Functional Limits Thoracic Assessment Thoracic Assessment: Exceptions to [facility] (Spinal precautions) Lumbar Assessment Lumbar Assessment: Exceptions to [facility] (Spinal precautions) Lumbar Strength Overall Lumbar Strength: Deficits Postural Control Postural Control: Deficits on evaluation Protective Responses: delayed and insufficient Postural Limitations: Impaired spinal alignment  Balance Balance Balance Assessed: Yes Static Sitting Balance Static Sitting - Balance Support: Feet supported Static Sitting - Level of Assistance: 6: Modified independent (Device/Increase time) Dynamic Sitting Balance Dynamic Sitting - Balance Support: During functional activity;Feet supported Dynamic Sitting - Level of Assistance: 6: Modified independent (Device/Increase time) Static Standing Balance Static Standing  - Balance Support: During functional activity Static Standing - Level of Assistance: 6: Modified independent (Device/Increase time) Dynamic Standing Balance Dynamic Standing - Balance Support: During functional activity Dynamic Standing - Level of Assistance: 6: Modified independent (Device/Increase time) Extremity Assessment      RLE Assessment RLE Assessment: Exceptions to [facility] (mild edema) RLE Strength Right Hip Flexion: 3-/5 Right  Knee Flexion: 4/5 Right Knee Extension: 3-/5 Right Ankle Dorsiflexion: 4-/5 Right Ankle Plantar Flexion: 4-/5 LLE Assessment LLE Assessment: Exceptions to WFL (mild edema; MMT tested in sitting) LLE Strength Left Hip Flexion: 4-/5 (able to accept resistance, but lacks full range) Left Knee Flexion: 4-/5 Left Knee Extension: 3-/5 Left Ankle Dorsiflexion: 3-/5 Left Ankle Plantar Flexion: 4-/5   See Function Navigator for Current Functional Status.  Warnell Bureau, PT, DPT  06/21/2016, 5:20 PM

## 2016-06-20 NOTE — Progress Notes (Addendum)
Physical Therapy Session Note  Patient Details  Name: [name] MRN: [id number] Date of Birth: 23-Mar-1946  Today's Date: 06/20/2016 PT Individual Time: 1300-1400 PT Individual Time Calculation (min): 60 min   Short Term Goals: Week 2:  PT Short Term Goal 1 (Week 2): = LTGs  Skilled Therapeutic Interventions/Progress Updates:    Session focused on family education for real car transfer with pt and pt's sons [name] and [name]). PT performed first repetition and assisted with problem solving safe technique and family return demonstrated at min assist level. Pt using her personal 3" step to get into the car and RW. Gait training back to unit at overall supervision level on uneven surfaces and supervision to modified independent on level surfaces > 500'. Reviewed home set-up and transfer techniques and pt and family deny any concerns in regards to d/c on Thurs. Pt also instructed in curb step negotiation outdoors with min/mod assist and cues for encouragement and technique. Son return demonstrated and assisted.   Therapy Documentation Precautions:  Precautions Precautions: Fall, Back Precaution Booklet Issued: No Precaution Comments: pt able to verbally recall 3/3 precautions. Needs cueing during functional activities. Required Braces or Orthoses: Spinal Brace Spinal Brace: Lumbar corset, Applied in sitting position Restrictions Weight Bearing Restrictions: No  Pain: C/o stomach pains - assisted pt onto toilet at end of session to attempt BM.       See Function Navigator for Current Functional Status.   Therapy/Group: Individual Therapy  Canary Brim Ivory Broad, PT, DPT  06/20/2016, 3:04 PM

## 2016-06-20 NOTE — Progress Notes (Signed)
Occupational Therapy Session Note  Patient Details  Name: [name] MRN: [id number] Date of Birth: 1946-04-23  Today's Date: 06/20/2016 OT Individual Time: 1405-1500 OT Individual Time Calculation (min): 55 min   Short Term Goals: Week 1:  OT Short Term Goal 1 (Week 1): Pt will be SBA with grooming OT Short Term Goal 1 - Progress (Week 1): Met OT Short Term Goal 2 (Week 1): Pt will be bathing using DME as needed with min assist OT Short Term Goal 2 - Progress (Week 1): Met OT Short Term Goal 3 (Week 1): Pt will be dressing with min assist OT Short Term Goal 3 - Progress (Week 1): Met OT Short Term Goal 4 (Week 1): Pt will transfer to 3n1 with min assist OT Short Term Goal 4 - Progress (Week 1): Met OT Short Term Goal 5 (Week 1): Pt will go from sit to stand with mod assist with high seated toilet OT Short Term Goal 5 - Progress (Week 1): Met   Week 2:  OT Short Term Goal 1 (Week 2): STG=LTG due to LOS  Skilled Therapeutic Interventions/Progress Updates:  Pt found in BR with two sons present. Son assisted pt to/from BR independently. Talked with family and family states that someone will be with her all the time, she will not be left alone. Son is independent with donning/doffing of brace. Pt ambulated and sat EOB for a few minutes to rest. Pt then ambulated with rollator from room to ADL apartment. In ADL apartment, pt prepared a boiled egg at mod I rollator level. No cues or physical assistance needed for this task. Discussed furniture transfer safety and lift chair transfers with patient. Pt ambulated back to room and at end of session, left pt seated EOB with sons present and all needs within reach.   Therapy Documentation Precautions:  Precautions Precautions: Fall, Back Precaution Booklet Issued: No Precaution Comments: pt able to verbally recall 3/3 precautions. Needs cueing during functional activities. Required Braces or Orthoses: Spinal Brace Spinal Brace: Lumbar  corset, Applied in sitting position Restrictions Weight Bearing Restrictions: No  See Function Navigator for Current Functional Status.  Therapy/Group: Individual Therapy  Chrys Racer , MS, OTR/L, CLT  06/20/2016, 3:07 PM

## 2016-06-20 NOTE — Progress Notes (Signed)
Subjective/Complaints: Would like to try something else orally for spasms. Willing to take flexeril more often. Therapy progressing   ROS: Pt denies fever, rash/itching, headache, blurred or double vision, nausea, vomiting, abdominal pain, diarrhea, chest pain, shortness of breath, palpitations, dysuria, dizziness, , bleeding, anxiety, or depression    Objective: Vital Signs: Blood pressure 138/67, pulse 65, temperature 98.5 F (36.9 C), temperature source Oral, resp. rate 16, height '4\' 10"'  (1.473 m), weight 55.43 kg (122 lb 3.2 oz), SpO2 97 %. No results found. Results for orders placed or performed during the hospital encounter of 06/09/16 (from the past 72 hour(s))  Urinalysis, Routine w reflex microscopic (not at [facility])     Status: Abnormal   Collection Time: 06/17/16 11:11 AM  Result Value Ref Range   Color, Urine YELLOW YELLOW   APPearance CLOUDY (A) CLEAR   Specific Gravity, Urine 1.022 1.005 - 1.030   pH 8.0 5.0 - 8.0   Glucose, UA NEGATIVE NEGATIVE mg/dL   Hgb urine dipstick NEGATIVE NEGATIVE   Bilirubin Urine NEGATIVE NEGATIVE   Ketones, ur NEGATIVE NEGATIVE mg/dL   Protein, ur NEGATIVE NEGATIVE mg/dL   Nitrite NEGATIVE NEGATIVE   Leukocytes, UA LARGE (A) NEGATIVE  Urine microscopic-add on     Status: Abnormal   Collection Time: 06/17/16 11:11 AM  Result Value Ref Range   Squamous Epithelial / LPF 6-30 (A) NONE SEEN   WBC, UA TOO NUMEROUS TO COUNT 0 - 5 WBC/hpf   RBC / HPF 0-5 0 - 5 RBC/hpf   Bacteria, UA MANY (A) NONE SEEN  Urine culture     Status: Abnormal   Collection Time: 06/17/16 11:12 AM  Result Value Ref Range   Specimen Description URINE, RANDOM    Special Requests NONE    Culture >=100,000 COLONIES/mL PSEUDOMONAS AERUGINOSA (A)    Report Status 06/19/2016 FINAL    Organism ID, Bacteria PSEUDOMONAS AERUGINOSA (A)       Susceptibility   Pseudomonas aeruginosa - MIC*    CEFTAZIDIME 4 SENSITIVE Sensitive     CIPROFLOXACIN <=0.25 SENSITIVE Sensitive     GENTAMICIN <=1 SENSITIVE Sensitive     IMIPENEM 2 SENSITIVE Sensitive     PIP/TAZO <=4 SENSITIVE Sensitive     CEFEPIME 2 SENSITIVE Sensitive     * >=100,000 COLONIES/mL PSEUDOMONAS AERUGINOSA  Basic metabolic panel     Status: Abnormal   Collection Time: 06/19/16  5:38 AM  Result Value Ref Range   Sodium 126 (L) 135 - 145 mmol/L   Potassium 3.7 3.5 - 5.1 mmol/L   Chloride 93 (L) 101 - 111 mmol/L   CO2 26 22 - 32 mmol/L   Glucose, Bld 88 65 - 99 mg/dL   BUN 7 6 - 20 mg/dL   Creatinine, Ser 0.63 0.44 - 1.00 mg/dL   Calcium 9.0 8.9 - 10.3 mg/dL   GFR calc non Af Amer >60 >60 mL/min   GFR calc Af Amer >60 >60 mL/min    Comment: (NOTE) The eGFR has been calculated using the CKD EPI equation. This calculation has not been validated in all clinical situations. eGFR's persistently <60 mL/min signify possible Chronic Kidney Disease.    Anion gap 7 5 - 15  Basic metabolic panel     Status: Abnormal   Collection Time: 06/20/16  5:12 AM  Result Value Ref Range   Sodium 131 (L) 135 - 145 mmol/L   Potassium 3.8 3.5 - 5.1 mmol/L   Chloride 96 (L) 101 - 111 mmol/L  CO2 28 22 - 32 mmol/L   Glucose, Bld 84 65 - 99 mg/dL   BUN 7 6 - 20 mg/dL   Creatinine, Ser 0.62 0.44 - 1.00 mg/dL   Calcium 9.1 8.9 - 10.3 mg/dL   GFR calc non Af Amer >60 >60 mL/min   GFR calc Af Amer >60 >60 mL/min    Comment: (NOTE) The eGFR has been calculated using the CKD EPI equation. This calculation has not been validated in all clinical situations. eGFR's persistently <60 mL/min signify possible Chronic Kidney Disease.    Anion gap 7 5 - 15     HEENT: normal Cardio: RRR Resp: CTA B/L. No rales or wheezes GI: BS positive and mild distention and tympany Extremity:  Pulses positive and 1+  pedal Edema Skin:   Wound dressed. Mild serous drainage at inferior aspect of wound. Wound filling in. Dry.  No odor or discoloration Neuro: Alert/Oriented and Abnormal Motor 4+ in BUE 3- BLE Musc/Skel:  Small TP in left  thoracic paraspinal below scapula Gen NAD   Assessment/Plan: 1. Functional deficits secondary to Lumbar radiculopathy s/p extension of Lumbar fusion to include L5-S1 as well as T12-L2 ant and post approach which require 3+ hours per day of interdisciplinary therapy in a comprehensive inpatient rehab setting. Physiatrist is providing close team supervision and 24 hour management of active medical problems listed below. Physiatrist and rehab team continue to assess barriers to discharge/monitor patient progress toward functional and medical goals. FIM: Function - Bathing Bathing activity did not occur: N/A Position: Shower Body parts bathed by patient: Right arm, Left arm, Chest, Abdomen, Front perineal area, Buttocks, Right upper leg, Left upper leg, Right lower leg, Left lower leg, Back Body parts bathed by helper: Back Bathing not applicable: Front perineal area, Buttocks, Right upper leg, Left upper leg, Right lower leg, Left lower leg (Did not attempt secondary to pt felt she didn't need to wash them today.) Assist Level: Supervision or verbal cues  Function- Upper Body Dressing/Undressing Upper body dressing/undressing activity did not occur: N/A What is the patient wearing?: Pull over shirt/dress, Orthosis Bra - Perfomed by patient: Thread/unthread right bra strap, Thread/unthread left bra strap, Hook/unhook bra (pull down sports bra) Pull over shirt/dress - Perfomed by patient: Thread/unthread right sleeve, Thread/unthread left sleeve, Put head through opening, Pull shirt over trunk Pull over shirt/dress - Perfomed by helper: Put head through opening, Pull shirt over trunk Assist Level: Set up Set up : To obtain clothing/put away, To apply TLSO, cervical collar Function - Lower Body Dressing/Undressing Lower body dressing/undressing activity did not occur: N/A What is the patient wearing?: [employer], Socks, Underwear Position: Other (comment) (Edge of tub bench) Underwear -  Performed by patient: Thread/unthread right underwear leg, Thread/unthread left underwear leg Underwear - Performed by helper: Pull underwear up/down Pants- Performed by patient: Thread/unthread right pants leg, Thread/unthread left pants leg Pants- Performed by helper: Pull pants up/down Non-skid slipper socks- Performed by helper: Don/doff right sock, Don/doff left sock Socks - Performed by helper: Don/doff right sock, Don/doff left sock TED Hose - Performed by helper: Don/doff right TED hose, Don/doff left TED hose Assist for footwear: Maximal assist Assist for lower body dressing: Supervision or verbal cues  Function - Toileting Toileting steps completed by patient: Adjust clothing prior to toileting, Adjust clothing after toileting Toileting steps completed by helper: Adjust clothing after toileting, Performs perineal hygiene Toileting Assistive Devices: Grab bar or rail Assist level: Touching or steadying assistance (Pt.75%)  Function - Toilet  Transfers Toilet transfer assistive device: Grab bar Assist level to toilet: Touching or steadying assistance (Pt > 75%) Assist level from toilet: Touching or steadying assistance (Pt > 75%) Assist level to bedside commode (at bedside): Supervision or verbal cues Assist level from bedside commode (at bedside): Moderate assist (Pt 50 - 74%/lift or lower)  Function - Chair/bed transfer Chair/bed transfer method: Ambulatory Chair/bed transfer assist level: Touching or steadying assistance (Pt > 75%) Chair/bed transfer assistive device: Walker, Armrests, Orthosis Chair/bed transfer details: Tactile cues for weight shifting, Manual facilitation for weight shifting, Verbal cues for technique  Function - Locomotion: Wheelchair Will patient use wheelchair at discharge?: No Function - Locomotion: Ambulation Assistive device: Walker-rolling, Orthosis Max distance: >500 Assist level: Supervision or verbal cues Assist level: Supervision or verbal  cues Assist level: Supervision or verbal cues Walk 150 feet activity did not occur: Safety/medical concerns Assist level: Supervision or verbal cues Walk 10 feet on uneven surfaces activity did not occur: Safety/medical concerns Assist level: Supervision or verbal cues  Function - Comprehension Comprehension: Auditory Comprehension assist level: Follows complex conversation/direction with no assist  Function - Expression Expression: Verbal Expression assist level: Expresses complex ideas: With no assist  Function - Social Interaction Social Interaction assist level: Interacts appropriately with others - No medications needed.  Function - Problem Solving Problem solving assist level: Solves complex problems: Recognizes & self-corrects  Function - Memory Memory assist level: Complete Independence: No helper Patient normally able to recall (first 3 days only): Current season   Medical Problem List and Plan: 1.  Decreased functional mobility secondary to lumbar stenosis with radiculopathy. Lumbar corset when out of bed, CIR PT, OT continue.   2.  DVT Prophylaxis/Anticoagulation: SCDs. Check vascular studies upon admit.Doppler negative, LE swelling multifactorial venous insuff, paraparesis, add THigh hi TEDs 3. Pain Management: scheduled oxycodone and ultram---             -kpad effective  -added muscle rub  -flexeril---will change back to q8prn as she feels that she's tolerating better 4. Acute blood loss anemia. Follow-up CBC. Continue iron supplement, Hgb 9.2. Fe++ supp 5. Neuropsych: This patient is capable of making decisions on her own behalf. 6. Skin/Wound Care: Routine skin checks. Drainage decreasing.    -changed cipro to cover urine 7. Fluids/Electrolytes/Nutrition: sodium down to 126  -off maxzide. Sodium back up to 131  -I personally reviewed the patient's labs today.  8. Rheumatoid arthritis/polymyositis. Continue Plaqopped  -off maxzide.---continue eleuenil 200 mg  daily, prednisone 5 mg daily 9. Hypertension. Lopressor 25 mg twice a day---stctrolyte permitting (sodium/potassium stable today) 10.  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  Urinary inc---rx UTI 12.  Constipation--increase senna-s to two tabs bid 13.  HypoK on supplement for 4.1 today  -continue to monitor/supplement 14. Edema/HTN:  -stopped maxzide. Elevate legs/ TEDS 15. Low grade temp: UCX positive for pseudomonas  -change keflex to cipro for 7 day course   LOS (Days) 11 A FACE TO FACE EVALUATION WAS PERFORMED  Nakima Fluegge T 06/20/2016, 8:48 AM

## 2016-06-20 NOTE — Progress Notes (Signed)
Occupational Therapy Session Note  Patient Details  Name: [name] MRN: [id number] Date of Birth: 27-Nov-1946  Today's Date: 06/20/2016 OT Individual Time: 1100-1130 OT Individual Time Calculation (min): 30 min    Short Term Goals: Week 1:  OT Short Term Goal 1 (Week 1): Pt will be SBA with grooming OT Short Term Goal 1 - Progress (Week 1): Met OT Short Term Goal 2 (Week 1): Pt will be bathing using DME as needed with min assist OT Short Term Goal 2 - Progress (Week 1): Met OT Short Term Goal 3 (Week 1): Pt will be dressing with min assist OT Short Term Goal 3 - Progress (Week 1): Met OT Short Term Goal 4 (Week 1): Pt will transfer to 3n1 with min assist OT Short Term Goal 4 - Progress (Week 1): Met OT Short Term Goal 5 (Week 1): Pt will go from sit to stand with mod assist with high seated toilet OT Short Term Goal 5 - Progress (Week 1): Met  Skilled Therapeutic Interventions/Progress Updates:    Therapeutic activity with focus on functional mobility.  At pt's request to practice endurance and overall strengthening and confidence- pt climbed stairs on short side with close supervision with more than reasonable amt of time with both rails. Also education and practice performed on stair climbing and descending with only one rail (sideways).  Also performed bed mobility from mat at 24 1/4 inches high without bed rail with extra time mod I with a plan if needing assistance. Son present for session and escorted pt back to room at end of session.  Therapy Documentation Precautions:  Precautions Precautions: Fall, Back Precaution Booklet Issued: No Precaution Comments: pt able to verbally recall 3/3 precautions. Needs cueing during functional activities. Required Braces or Orthoses: Spinal Brace Spinal Brace: Lumbar corset, Applied in sitting position Restrictions Weight Bearing Restrictions: No General:   Vital Signs: Therapy Vitals Temp: 98.7 F (37.1 C) Temp Source:  Oral Pulse Rate: 80 Resp: 17 BP: (!) 144/80 mmHg Patient Position (if appropriate): Sitting Oxygen Therapy SpO2: 99 % O2 Device: Not Delivered Pain: No c/o during session except for soreness with bed mobility See Function Navigator for Current Functional Status.   Therapy/Group: Individual Therapy  Willeen Cass [facility] 06/20/2016, 4:02 PM

## 2016-06-21 ENCOUNTER — Inpatient Hospital Stay (HOSPITAL_COMMUNITY): Payer: Medicare HMO

## 2016-06-21 ENCOUNTER — Inpatient Hospital Stay (HOSPITAL_COMMUNITY): Payer: Medicare HMO | Admitting: Occupational Therapy

## 2016-06-21 ENCOUNTER — Inpatient Hospital Stay (HOSPITAL_COMMUNITY): Payer: Medicare HMO | Admitting: Physical Therapy

## 2016-06-21 DIAGNOSIS — M791 Myalgia: Secondary | ICD-10-CM

## 2016-06-21 LAB — BASIC METABOLIC PANEL
ANION GAP: 6 (ref 5–15)
BUN: 9 mg/dL (ref 6–20)
CALCIUM: 8.7 mg/dL — AB (ref 8.9–10.3)
CO2: 29 mmol/L (ref 22–32)
Chloride: 96 mmol/L — ABNORMAL LOW (ref 101–111)
Creatinine, Ser: 0.55 mg/dL (ref 0.44–1.00)
GLUCOSE: 84 mg/dL (ref 65–99)
Potassium: 3.4 mmol/L — ABNORMAL LOW (ref 3.5–5.1)
Sodium: 131 mmol/L — ABNORMAL LOW (ref 135–145)

## 2016-06-21 NOTE — Progress Notes (Signed)
Social Work Patient ID: Lockie Pares, female   DOB: 04-Mar-1946, 70 y.o.   MRN: [id number]   Have reviewed team conf with pt and son yesterday.  Both aware and agreeable with change in d/c date to 7/6 and supervision goals overall.  Discussed f/u services.  No further concerns.   Dillin Lofgren, LCSW

## 2016-06-21 NOTE — Progress Notes (Signed)
Subjective/Complaints: Would like to try something else orally for spasms. Willing to take flexeril more often. Therapy progressing   ROS: Pt denies fever, rash/itching, headache, blurred or double vision, nausea, vomiting, abdominal pain, diarrhea, chest pain, shortness of breath, palpitations, dysuria, dizziness, , bleeding, anxiety, or depression    Objective: Vital Signs: Blood pressure 127/71, pulse 78, temperature 98.9 F (37.2 C), temperature source Oral, resp. rate 16, height _0  (1.473 m), weight 55.43 kg (122 lb 3.2 oz), SpO2 99 %. No results found. Results for orders placed or performed during the hospital encounter of 06/09/16 (from the past 72 hour(s))  Basic metabolic panel     Status: Abnormal   Collection Time: 06/19/16  5:38 AM  Result Value Ref Range   Sodium 126 (L) 135 - 145 mmol/L   Potassium 3.7 3.5 - 5.1 mmol/L   Chloride 93 (L) 101 - 111 mmol/L   CO2 26 22 - 32 mmol/L   Glucose, Bld 88 65 - 99 mg/dL   BUN 7 6 - 20 mg/dL   Creatinine, Ser 0.63 0.44 - 1.00 mg/dL   Calcium 9.0 8.9 - 10.3 mg/dL   GFR calc non Af Amer >60 >60 mL/min   GFR calc Af Amer >60 >60 mL/min    Comment: (NOTE) The eGFR has been calculated using the CKD EPI equation. This calculation has not been validated in all clinical situations. eGFR's persistently <60 mL/min signify possible Chronic Kidney Disease.    Anion gap 7 5 - 15  Basic metabolic panel     Status: Abnormal   Collection Time: 06/20/16  5:12 AM  Result Value Ref Range   Sodium 131 (L) 135 - 145 mmol/L   Potassium 3.8 3.5 - 5.1 mmol/L   Chloride 96 (L) 101 - 111 mmol/L   CO2 28 22 - 32 mmol/L   Glucose, Bld 84 65 - 99 mg/dL   BUN 7 6 - 20 mg/dL   Creatinine, Ser 0.62 0.44 - 1.00 mg/dL   Calcium 9.1 8.9 - 10.3 mg/dL   GFR calc non Af Amer >60 >60 mL/min   GFR calc Af Amer >60 >60 mL/min    Comment: (NOTE) The eGFR has been calculated using the CKD EPI equation. This calculation has not been validated in all  clinical situations. eGFR's persistently <60 mL/min signify possible Chronic Kidney Disease.    Anion gap 7 5 - 15  Basic metabolic panel     Status: Abnormal   Collection Time: 06/21/16  5:53 AM  Result Value Ref Range   Sodium 131 (L) 135 - 145 mmol/L   Potassium 3.4 (L) 3.5 - 5.1 mmol/L   Chloride 96 (L) 101 - 111 mmol/L   CO2 29 22 - 32 mmol/L   Glucose, Bld 84 65 - 99 mg/dL   BUN 9 6 - 20 mg/dL   Creatinine, Ser 0.55 0.44 - 1.00 mg/dL   Calcium 8.7 (L) 8.9 - 10.3 mg/dL   GFR calc non Af Amer >60 >60 mL/min   GFR calc Af Amer >60 >60 mL/min    Comment: (NOTE) The eGFR has been calculated using the CKD EPI equation. This calculation has not been validated in all clinical situations. eGFR's persistently <60 mL/min signify possible Chronic Kidney Disease.    Anion gap 6 5 - 15     HEENT: normal Cardio: RRR Resp: CTA B/L. No rales or wheezes GI: BS positive and mild distention and tympany Extremity:  Pulses positive and 1+  pedal Edema  Skin:   Wound dressed. Dime sized area of serous drainage at inferior aspect of wound. Wound filling in. Dry otherwise.  No odor or discoloration Neuro: Alert/Oriented and Abnormal Motor 4+ in BUE 3- BLE Musc/Skel:  Small TP in left thoracic paraspinal below scapula. Fair posture Gen NAD   Assessment/Plan: 1. Functional deficits secondary to Lumbar radiculopathy s/p extension of Lumbar fusion to include L5-S1 as well as T12-L2 ant and post approach which require 3+ hours per day of interdisciplinary therapy in a comprehensive inpatient rehab setting. Physiatrist is providing close team supervision and 24 hour management of active medical problems listed below. Physiatrist and rehab team continue to assess barriers to discharge/monitor patient progress toward functional and medical goals. FIM: Function - Bathing Bathing activity did not occur: N/A Position: Shower Body parts bathed by patient: Right arm, Left arm, Chest, Abdomen, Front  perineal area, Buttocks, Right upper leg, Left upper leg, Right lower leg, Left lower leg, Back Body parts bathed by helper: Back Bathing not applicable: Front perineal area, Buttocks, Right upper leg, Left upper leg, Right lower leg, Left lower leg (Did not attempt secondary to pt felt she didn't need to wash them today.) Assist Level: Supervision or verbal cues  Function- Upper Body Dressing/Undressing Upper body dressing/undressing activity did not occur: N/A What is the patient wearing?: Pull over shirt/dress, Orthosis Bra - Perfomed by patient: Thread/unthread right bra strap, Thread/unthread left bra strap, Hook/unhook bra (pull down sports bra) Pull over shirt/dress - Perfomed by patient: Thread/unthread right sleeve, Thread/unthread left sleeve, Put head through opening, Pull shirt over trunk Pull over shirt/dress - Perfomed by helper: Put head through opening, Pull shirt over trunk Assist Level: Set up Set up : To obtain clothing/put away, To apply TLSO, cervical collar Function - Lower Body Dressing/Undressing Lower body dressing/undressing activity did not occur: N/A What is the patient wearing?: [employer], Socks, Underwear Position: Other (comment) (Edge of tub bench) Underwear - Performed by patient: Thread/unthread right underwear leg, Thread/unthread left underwear leg Underwear - Performed by helper: Pull underwear up/down Pants- Performed by patient: Thread/unthread right pants leg, Thread/unthread left pants leg Pants- Performed by helper: Pull pants up/down Non-skid slipper socks- Performed by helper: Don/doff right sock, Don/doff left sock Socks - Performed by helper: Don/doff right sock, Don/doff left sock TED Hose - Performed by helper: Don/doff right TED hose, Don/doff left TED hose Assist for footwear: Maximal assist Assist for lower body dressing: Supervision or verbal cues  Function - Toileting Toileting steps completed by patient: Adjust clothing prior to  toileting, Adjust clothing after toileting Toileting steps completed by helper: Performs perineal hygiene Toileting Assistive Devices: Grab bar or rail Assist level: Supervision or verbal cues  Function Midwife transfer assistive device: Grab bar Assist level to toilet: Touching or steadying assistance (Pt > 75%) Assist level from toilet: Touching or steadying assistance (Pt > 75%) Assist level to bedside commode (at bedside): Supervision or verbal cues Assist level from bedside commode (at bedside): Moderate assist (Pt 50 - 74%/lift or lower)  Function - Chair/bed transfer Chair/bed transfer method: Ambulatory Chair/bed transfer assist level: Supervision or verbal cues (elevated bed) Chair/bed transfer assistive device: Walker, Armrests, Orthosis Chair/bed transfer details: Tactile cues for weight shifting, Manual facilitation for weight shifting, Verbal cues for technique  Function - Locomotion: Wheelchair Will patient use wheelchair at discharge?: No Function - Locomotion: Ambulation Assistive device: Walker-rolling, Orthosis Max distance: >500 Assist level: Supervision or verbal cues Assist level: No help, No cues,  assistive device, takes more than a reasonable amount of time Assist level: No help, No cues, assistive device, takes more than a reasonable amount of time Walk 150 feet activity did not occur: Safety/medical concerns Assist level: Supervision or verbal cues Walk 10 feet on uneven surfaces activity did not occur: Safety/medical concerns Assist level: Supervision or verbal cues  Function - Comprehension Comprehension: Auditory Comprehension assist level: Follows complex conversation/direction with no assist  Function - Expression Expression: Verbal Expression assist level: Expresses complex ideas: With no assist  Function - Social Interaction Social Interaction assist level: Interacts appropriately with others - No medications  needed.  Function - Problem Solving Problem solving assist level: Solves complex problems: Recognizes & self-corrects  Function - Memory Memory assist level: Complete Independence: No helper Patient normally able to recall (first 3 days only): Current season   Medical Problem List and Plan: 1.  Decreased functional mobility secondary to lumbar stenosis with radiculopathy. Lumbar corset when out of bed, CIR PT, OT continue.   2.  DVT Prophylaxis/Anticoagulation: SCDs. Check vascular studies upon admit.Doppler negative, LE swelling multifactorial venous insuff, paraparesis, add THigh hi TEDs 3. Pain Management: scheduled oxycodone and ultram---             -kpad effective  -added muscle rub  -flexeril---q 8 prn. Makes her feel a little drowsy during day. Not overly effective  -will try TPI in the AM prior to dc 4. Acute blood loss anemia. Follow-up CBC. Continue iron supplement, Hgb 9.2. Fe++ supp 5. Neuropsych: This patient is capable of making decisions on her own behalf. 6. Skin/Wound Care: Routine skin checks. Drainage decreasing. Sl serous disharge still.  -changed cipro to cover urine too 7. Fluids/Electrolytes/Nutrition: sodium down to 126  -off maxzide. Sodium remains 131. She'll need to discuss with her PCP after discharge  -I personally reviewed the patient's labs today.  8. Rheumatoid arthritis/polymyositis. Continue Plaqopped  -off maxzide.---continue eleuenil 200 mg daily, prednisone 5 mg daily 9. Hypertension. Lopressor 25 mg twice a day---stctrolyte permitting (sodium/potassium stable today) 10.  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  Urinary inc---rx UTI 12.  Constipation--increase senna-s to two tabs bid 13.  HypoK on supplement for 4.1 today  -continue to monitor/supplement 14. Edema/HTN:  -stopped maxzide. Elevate legs/ TEDS continue 15. Low grade temp: UCX positive for pseudomonas  -changed keflex to cipro  for 7 day course   LOS (Days) 12 A FACE TO FACE EVALUATION WAS PERFORMED  [name] T 06/21/2016, 8:52 AM

## 2016-06-21 NOTE — Discharge Summary (Signed)
NAMEJODEAN, [name]               ACCOUNT NO.:  [id number]  MEDICAL RECORD NO.:  [id number]  LOCATION:                                 FACILITY:  PHYSICIAN:  Meredith Staggers, M.D.DATE OF BIRTH:  [birth year]  DATE OF ADMISSION:  06/05/2016 DATE OF DISCHARGE:  06/22/2016                              DISCHARGE SUMMARY   DISCHARGE DIAGNOSES: 1. Lumbar stenosis with radiculopathy. 2. SCDs for DVT prophylaxis. 3. Pain management. 4. Acute blood loss anemia. 5. Rheumatoid arthritis. 6. Hypertension. 7. 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 8. Constipation. 9. Decreased nutritional storage.  HISTORY OF PRESENT ILLNESS:  This is a 70 year old right-handed female with history of hypertension, polymyositis, rheumatoid arthritis, chronic back pain, with history of lumbar fusion in 2014.  Per chart review, she lives alone independent with assistive device prior to admission.  She has assistance as needed from her family.  Presented on May 31, 2016, with progressive back pain radiating to the lower extremities.  X-rays and imaging revealed marked disk degeneration, spondylosis, stenosis with radiculopathy.  Underwent lumbar L5-S1 anterior lumbar interbody fusion with anterior exposure on June 02, 2016, per Dr. Vertell Limber in the assistance of Dr. Donnetta Hutching.  Hospital course, pain management.  Cardiology Services consulted June 03, 2016, for elevated troponin 0.198.  Nonspecific chest pain.  Workup suggested atypical chest pain.  Elevated troponin likely due to demand ischemia from surgery.  Advised to begin aspirin therapy.  Echocardiogram with ejection fraction of [id number] grade 1 diastolic dysfunction.  No wall motion abnormalities.  Acute blood loss anemia 7.4, transfused.  Physical and occupational therapy ongoing.  The patient was admitted for comprehensive rehab program.  PAST MEDICAL HISTORY:  See discharge diagnoses.  SOCIAL HISTORY:  Lives alone, independent with assistive device  prior to admission, she has family in the area.  Functional status upon admission to rehab services was min assist for ambulate 100 feet, rolling walker, moderate assist for stand pivot transfers, min to mod assist for activities of daily living.  PHYSICAL EXAMINATION:  VITAL SIGNS:  Blood pressure 138/80, pulse 75, temperature 98, and respirations 20. GENERAL:  This was an alert female, oriented x3. LUNGS:  Clear to auscultation without wheeze. CARDIAC:  Regular rate and rhythm.  No murmur. ABDOMEN:  Soft, nontender.  Good bowel sounds. BACK:  Incision with some scant serosanguineous drainage.  No odor.  REHABILITATION HOSPITAL COURSE:  The patient was admitted to inpatient rehab services with therapies initiated on a 3-hour daily basis consisting of physical therapy, occupational therapy, and rehabilitation nursing.  The following issues were addressed during the patient's rehabilitation stay.  Pertaining to [name], lumbar stenosis with radiculopathy, she had undergone surgical intervention per Dr. Erline Levine, back corset when out of bed, surgical site healing nicely.  SCDs for DVT prophylaxis.  Venous Doppler studies negative.  Hospital course, nonspecific chest pain.  Workup per Cardiology Services.  Advised aspirin therapy.  History of rheumatoid arthritis, chronic prednisone therapy.  She was refusing her Plaquenil.  Blood pressure is well controlled.  No orthostatic changes.  She did have a history of esophageal dysmotility.  She was tolerating a regular diet.  Bouts of constipation resolved with laxative  assistance.  Pseudomonas urinary tract infection, placed on Cipro.  She remained afebrile.  Acute blood loss anemia, stable, hemoglobin 9.2, on iron supplement.  Pain management with the use of a heating pad, muscle rub, oxycodone as directed as well as tramadol.  The patient received weekly collaborative interdisciplinary team conferences to discuss estimated length of  stay, family teaching, any barriers to discharge.  The patient performed repetition and assisted with any problem solving techniques to maintain her back precautions.  Ambulated supervision level greater than 500 feet, modified independent.  Received home set up and transfer techniques, the patient was able to do quite nicely.  Son and family went through extensive teaching.  She could gather her belongings for activities of daily living and homemaking.  Family would provide the necessary assistance.  She is wearing a back corset when out of bed. The patient was discharged to home.  DISCHARGE MEDICATIONS:  Included; 1. Aspirin 81 mg p.o. daily. 2. Vitamin D 5000 units p.o. daily. 3. Cipro 250 mg p.o. b.i.d. x10 days total. 4. Flexeril 5 mg p.o. t.i.d. as needed. 5. Colace 100 mg p.o. b.i.d. 6. Pepcid 20 mg p.o. daily. 7. Niferex 150 mg p.o. daily. 8. Oxycodone immediate release 10 mg as needed 9. Prednisone 5 mg p.o. daily. 10.Ophthalmic eyedrops as prior to admission at bedtime and daily as     needed. 11.Ultram 50 mg p.o. t.i.d. 12. Flexeril 5 mg 3 times a day as needed   DIET:  The patient's diet is regular.  Special instructions. Home health nurse to check BMET 06/29/2016 results to [facility] [phone]  FOLLOWUP:  Followup with Dr. Alger Simons at the Outpatient Rehab Service office as directed; Dr. Erline Levine, call for appointment; Dr. Delmar Landau, medical management.  SPECIAL INSTRUCTIONS:  Back corset when out of bed.     [name], P.A.   ______________________________ Meredith Staggers, M.D.    DA/MEDQ  D:  06/21/2016  T:  06/21/2016  Job:  [id number]  cc:   Meredith Staggers, M.D. Marchia Meiers. Vertell Limber, M.D. Dr. Delmar Landau

## 2016-06-21 NOTE — Discharge Summary (Signed)
Discharge summary job [id number]

## 2016-06-21 NOTE — Progress Notes (Signed)
Physical Therapy Session Note  Patient Details  Name: [name] MRN: [id number] Date of Birth: March 11, 1946  Today's Date: 06/21/2016 PT Individual Time: [id number] PT Individual Time Calculation (min): 42 min   Short Term Goals: Week 2:  PT Short Term Goal 1 (Week 2): = LTGs  Skilled Therapeutic Interventions/Progress Updates:    Pt received sitting on EOB & agreeable to PT, noting 6/10 back pain & overall stiffness but premedicated. PT provided assistance for lumbar corset adjustment (lower on back). Gait training x 160 ft with RW & supervision; pt with decreased gait speed & decreased step length BLE. Stair training completed x 8 steps (6") with B rails & close supervision. Pt instructed on compensatory strategy, ascend leading with LLE, descend leading with RLE & pt able to return demonstrate. Pt voiced fear of knee buckling when ascending stairs. At end of session pt left sitting on EOB with all needs within reach.   Therapy Documentation Precautions:  Precautions Precautions: Fall, Back Precaution Booklet Issued: No Precaution Comments: pt able to verbally recall 3/3 precautions. Needs cueing during functional activities. Required Braces or Orthoses: Spinal Brace Spinal Brace: Lumbar corset, Applied in sitting position Restrictions Weight Bearing Restrictions: No  Pain: Pain Assessment Pain Assessment: 0-10 Pain Score: 6  Pain Type: Surgical pain Pain Location: Back Pain Descriptors / Indicators:  (stiffness) Pain Intervention(s): Ambulation/increased activity (premedicated)   See Function Navigator for Current Functional Status.   Therapy/Group: Individual Therapy  Waunita Schooner 06/21/2016, 7:54 AM

## 2016-06-21 NOTE — Progress Notes (Signed)
Subjective: Patient reports some continued incisional drainage and left leg weakness.  Objective: Vital signs in last 24 hours: Temp:  [98.7 F (37.1 C)-98.9 F (37.2 C)] 98.9 F (37.2 C) (07/05 0521) Pulse Rate:  [78-80] 78 (07/05 0521) Resp:  [16-17] 16 (07/05 0521) BP: (127-144)/(71-80) 127/71 mmHg (07/05 0521) SpO2:  [99 %] 99 % (07/05 0521)  Intake/Output from previous day: 07/04 0701 - 07/05 0700 In: 360 [P.O.:360] Out: -  Intake/Output this shift:    Physical Exam: Mild quadriceps weakness on left.  2 cm area of dehiscence at lower aspect of wound without erythema.  Lab Results: No results for input(s): WBC, HGB, HCT, PLT in the last 72 hours. BMET  Recent Labs  06/20/16 0512 06/21/16 0553  NA 131* 131*  K 3.8 3.4*  CL 96* 96*  CO2 28 29  GLUCOSE 84 84  BUN 7 9  CREATININE 0.62 0.55  CALCIUM 9.1 8.7*    Studies/Results: No results found.  Assessment/Plan: Continue dressing changes, cipro,  and Rehab.  F/U in office after D/C within 2 weeks.    LOS: 12 days    Peggyann Shoals, MD 06/21/2016, 8:29 AM

## 2016-06-21 NOTE — Progress Notes (Signed)
Occupational Therapy Session Note  Patient Details  Name: [name] MRN: [id number] Date of Birth: 16-Jan-1946  Today's Date: 06/21/2016 OT Individual Time: [id number]      Short Term Goals: Week 2:  OT Short Term Goal 1 (Week 2): STG=LTG due to LOS  Skilled Therapeutic Interventions/Progress Updates:    Pt seen for skilled OT session focusing on self care. Pt sitting EOB and agreeable to tx session upon arrival. Pt complained of pain in left lower back and RN administered medication during session. Pt ambulated throughout session with RW and supervision. Pt ambulated to rehab shower and transferred to TTB recurring VC for safety. Pt doffed clothes sitting on TTB and needed total A to doff TEDs and shoes, but mod I for brace and dress. Once undressed pt initiated bringing legs over the side of the tub, but required OT's help for BLE. Pt completed bathing with supervision and used long handled sponge to wash feet. Pt required assist to bring legs out of tub and donned clothes sitting on TTB requiring total A for TEDs and shoes and Mod I for dress and back brace. Due to fatigue pt required mod A sit to stand. Pt ambulated to room to use 3 and 1 commode over toilet needing min A to stand from [facility]. Pt then ambulated approximately 100 ft in the hallway to build endurance. Pt left sitting EOB with all needs met. Throughout session OT gave VC for energy conservation and walker safety.   Therapy Documentation Precautions:  Precautions Precautions: Fall, Back Precaution Booklet Issued: No Precaution Comments: pt able to verbally recall 3/3 precautions. Needs cueing during functional activities. Required Braces or Orthoses: Spinal Brace Spinal Brace: Lumbar corset, Applied in sitting position Restrictions Weight Bearing Restrictions: No Pain: Pain Assessment Pain Assessment: 0-10 Pain Score: 6  Pain Type: Surgical pain Pain Location: Back Pain Descriptors / Indicators:  (stiffness) Pain  Intervention(s): Ambulation/increased activity (premedicated)  See Function Navigator for Current Functional Status.   Therapy/Group: Individual Therapy  Matilde Bash 06/21/2016, 12:09 PM

## 2016-06-21 NOTE — Progress Notes (Signed)
Occupational Therapy Discharge Summary  Patient Details  Name: [name] MRN: [id number] Date of Birth: September 04, 1946   Patient has met 8 of 8 long term goals due to improved activity tolerance, improved balance, postural control and improved coordination.  Patient to discharge at [facility] Assist level.  Patient's care partner is independent to provide the necessary physical assistance at discharge.    Pt varies with needed assist from mod I level to occasional mod A. She is mod I when completing sit <> stands from elevated surfaces, however, requires up to mod A to stand from standard height surfaces due to LE weakness. Pt's sons have been present for all therapy sessions and will be providing 24 hour assist at d/c and are capable and willing to provide all needed assist.   Recommendation:  Patient will benefit from ongoing skilled OT services in home health setting to continue to advance functional skills in the area of BADL, iADL and Reduce care partner burden.  Equipment: Tub transfer bench  Reasons for discharge: treatment goals met and discharge from hospital  Patient/family agrees with progress made and goals achieved: Yes  OT Discharge Precautions/Restrictions  Precautions Precautions: Fall;Back Required Braces or Orthoses: Spinal Brace Spinal Brace: Lumbar corset;Applied in sitting position Restrictions Weight Bearing Restrictions: No Vision/Perception  Vision- History Baseline Vision/History: Wears glasses Wears Glasses: At all times Patient Visual Report: No change from baseline Vision- Assessment Vision Assessment?: No apparent visual deficits  Cognition Overall Cognitive Status: Within Functional Limits for tasks assessed Arousal/Alertness: Awake/alert Orientation Level: Oriented X4 Selective Attention: Appears intact Memory: Appears intact Awareness: Appears intact Problem Solving: Appears intact Safety/Judgment: Appears intact Comments: Min cuing for  adhering to back precautions during functional tasks Sensation Sensation Light Touch: Appears Intact Stereognosis: Appears Intact Hot/Cold: Appears Intact Proprioception: Appears Intact Coordination Gross Motor Movements are Fluid and Coordinated: No Fine Motor Movements are Fluid and Coordinated: No Motor  Motor Motor: Abnormal postural alignment and control Trunk/Postural Assessment  Cervical Assessment Cervical Assessment: Within Functional Limits Thoracic Assessment Thoracic Assessment: Exceptions to [facility] (Spinal precautions) Lumbar Assessment Lumbar Assessment: Exceptions to [facility] (Spinal precautions) Lumbar Strength Overall Lumbar Strength: Deficits Postural Control Postural Control: Deficits on evaluation Postural Limitations: Impaired spinal alignment  Balance Balance Balance Assessed: Yes Static Sitting Balance Static Sitting - Balance Support: Feet supported Static Sitting - Level of Assistance: 6: Modified independent (Device/Increase time) Dynamic Sitting Balance Dynamic Sitting - Balance Support: During functional activity;Feet supported Dynamic Sitting - Level of Assistance: 6: Modified independent (Device/Increase time) Static Standing Balance Static Standing - Balance Support: During functional activity Static Standing - Level of Assistance: 6: Modified independent (Device/Increase time) Dynamic Standing Balance Dynamic Standing - Balance Support: During functional activity Dynamic Standing - Level of Assistance: 6: Modified independent (Device/Increase time) Extremity/Trunk Assessment RUE Assessment RUE Assessment: Within Functional Limits (Impaired gross grasp due to RA) LUE Assessment LUE Assessment: Within Functional Limits (Impaired gross grasp due to RA)   See Function Navigator for Current Functional Status.  Lewis, Otilia Kareem C 06/21/2016, 7:41 AM

## 2016-06-21 NOTE — Progress Notes (Signed)
Occupational Therapy Session Note  Patient Details  Name: [name] MRN: [id number] Date of Birth: May 15, 1946  Today's Date: 06/21/2016 OT Individual Time: 1430-1500 OT Individual Time Calculation (min): 30 min    Short Term Goals: Week 2:  OT Short Term Goal 1 (Week 2): STG=LTG due to LOS  Skilled Therapeutic Interventions/Progress Updates:    Pt seen for OT session focusing on functional mobility and RW management. Session consisted of pt packing up belongings in prep for d/c home tomorrow. Ambulated throughout room with RW, min cuing required to stay inside RW when turning as pt would leave walker out to the side. Tolerated ~25 minutes of dynamic standing and mobility throughout session demonstrating good activity tolerance and balance. Educated regarding enregy conservation with IADL tasks. Required increased trials and raised surface to complete sit> stand from EOB, mod I from elevated surface.  Pt left sitting EOB at end of session, all needs in reach.   Therapy Documentation Precautions:  Precautions Precautions: Fall, Back Precaution Booklet Issued: No Precaution Comments: pt able to verbally recall 3/3 precautions. Needs cueing during functional activities. Required Braces or Orthoses: Spinal Brace Spinal Brace: Lumbar corset, Applied in sitting position Restrictions Weight Bearing Restrictions: No Pain: Pain Assessment Pain Assessment: 0-10 Pain Score: 6  Faces Pain Scale: Hurts a little bit Pain Type: Surgical pain Pain Location: Back Pain Descriptors / Indicators:  (stiffness) Pain Intervention(s): Increased activity (pre-medicated)  See Function Navigator for Current Functional Status.   Therapy/Group: Individual Therapy  Lewis, Philemon Riedesel C 06/21/2016, 3:12 PM

## 2016-06-21 NOTE — Progress Notes (Signed)
Physical Therapy Note  Patient Details  Name: [name] MRN: [id number] Date of Birth: Mar 12, 1946 Today's Date: 06/21/2016  1312-1350, 38 min individual therapy Pain: no c/o  Tx 1:  LSO donned in sitting.  Sit> stand from elevated bed with RW, after several tries.  Gait to and from room with RW.  Gait velocity 0.86'/sec for 10 MWT.  Pt performed therex in sitting: R/L long arc quad knee extensions, heel/toe raises bil and alternately, marching, knee flexion, 2-3 sets of 10 each.   Pt left resting EOB with feet supported and all needs within reach.  Tx 2 1530-1600 min concurrent; 1600-1610 individual tx Pain: no c/o  Pt's preferred slipper socks were wet with spilled water; PT assisted pt don new shoes.   Pt already wearing LSO, seated EOB packing small clothing items into her bags. Gait to/from gym with Rw. Therapeutic exercise performed with bil LEs to increase strength for functional mobility: seated long arc quad knee extensions toward target, marching, bil hip adduction against vertical bolster, bil hip abduction against resistance of orange Theraband, 3 x 10 each.  Pt left resting EOB with feet supported and all needs within reach.[name] 06/21/2016, 4:54 PM

## 2016-06-22 MED ORDER — TRAMADOL HCL 50 MG PO TABS: 50.0000 mg | ORAL_TABLET | Freq: Three times a day (TID) | ORAL | Status: AC

## 2016-06-22 MED ORDER — POLYSACCHARIDE IRON COMPLEX 150 MG PO CAPS: 150.0000 mg | ORAL_CAPSULE | Freq: Every day | ORAL | Status: DC

## 2016-06-22 MED ORDER — PREDNISONE 5 MG PO TABS: 5.0000 mg | ORAL_TABLET | Freq: Every day | ORAL | Status: AC

## 2016-06-22 MED ORDER — CYCLOBENZAPRINE HCL 5 MG PO TABS: 5.0000 mg | ORAL_TABLET | Freq: Three times a day (TID) | ORAL | Status: DC | PRN

## 2016-06-22 MED ORDER — DOCUSATE SODIUM 50 MG/5ML PO LIQD: 100.0000 mg | Freq: Two times a day (BID) | ORAL | Status: DC

## 2016-06-22 MED ORDER — CIPROFLOXACIN HCL 500 MG PO TABS: 250.0000 mg | ORAL_TABLET | Freq: Two times a day (BID) | ORAL | Status: DC

## 2016-06-22 MED ORDER — CIPROFLOXACIN HCL 250 MG PO TABS: 250.0000 mg | ORAL_TABLET | Freq: Two times a day (BID) | ORAL | Status: DC

## 2016-06-22 MED ORDER — CIPROFLOXACIN 500 MG/5ML (10%) PO SUSR
250.0000 mg | Freq: Two times a day (BID) | ORAL | Status: DC
  Filled 2016-06-22: qty 2.5

## 2016-06-22 MED ORDER — OXYCODONE HCL 5 MG PO TABS
10.0000 mg | ORAL_TABLET | ORAL | Status: DC | PRN
  Administered 2016-06-22: 10 mg via ORAL
  Filled 2016-06-22: qty 2

## 2016-06-22 MED ORDER — CIPROFLOXACIN 500 MG/5ML (10%) PO SUSR: ORAL | Status: DC

## 2016-06-22 MED ORDER — OXYCODONE HCL 10 MG PO TABS: 10.0000 mg | ORAL_TABLET | ORAL | Status: DC | PRN

## 2016-06-22 MED ORDER — FAMOTIDINE 40 MG/5ML PO SUSR: 20.0000 mg | Freq: Every day | ORAL | Status: DC

## 2016-06-22 MED ORDER — ASPIRIN 81 MG PO CHEW: 81.0000 mg | CHEWABLE_TABLET | Freq: Every day | ORAL | Status: DC

## 2016-06-22 NOTE — Progress Notes (Signed)
Pt. Got d/c instructions and follow up appointments.Pt. Ready to go home with her son. 

## 2016-06-22 NOTE — Progress Notes (Signed)
Social Work  Discharge Note  The overall goal for the admission was met for:   Discharge location: Yes  Length of Stay: Yes - 13 days  Discharge activity level: Yes - supervision overall  Home/community participation: Yes  Services provided included: MD, RD, PT, OT, RN, Pharmacy and SW  Financial Services: [employer]  Follow-up services arranged: Home Health: RN, PT, OT via [facility] and Patient/Family has no preference for HH/DME agencies  Comments (or additional information):  Patient/Family verbalized understanding of follow-up arrangements: Yes  Individual responsible for coordination of the follow-up plan: pt  Confirmed correct DME delivered: NA - has all needed DME    HOYLE, LUCY 

## 2016-06-22 NOTE — Discharge Instructions (Signed)
Inpatient Rehab Discharge Instructions  [name] Discharge date and time: No discharge date for patient encounter.   Activities/Precautions/ Functional Status: Activity: Lumbar corset when out of bed Diet: regular diet Wound Care: keep wound clean and dry Functional status:  ___ No restrictions     ___ Walk up steps independently ___ 24/7 supervision/assistance   ___ Walk up steps with assistance ___ Intermittent supervision/assistance  ___ Bathe/dress independently ___ Walk with walker     _x__ Bathe/dress with assistance ___ Walk Independently    ___ Shower independently ___ Walk with assistance    ___ Shower with assistance ___ No alcohol     ___ Return to work/school ________    COMMUNITY REFERRALS UPON DISCHARGE:    Home Health:   PT     OT     RN                       Agency: [facility] Phone: [phone]      Special Instructions: Home health RN to check BMET 06/29/2016 results to [place] [id number]   My questions have been answered and I understand these instructions. I will adhere to these goals and the provided educational materials after my discharge from the hospital.  Patient/Caregiver Signature _______________________________ Date __________  Clinician Signature _______________________________________ Date __________  Please bring this form and your medication list with you to all your follow-up doctor's appointments.

## 2016-06-22 NOTE — Progress Notes (Signed)
Subjective/Complaints: Back spasms a little better this morning. Bother her more when she moves.    ROS: Pt denies fever, rash/itching, headache, blurred or double vision, nausea, vomiting, abdominal pain, diarrhea, chest pain, shortness of breath, palpitations, dysuria, dizziness, , bleeding, anxiety, or depression    Objective: Vital Signs: Blood pressure 111/52, pulse 74, temperature 98 F (36.7 C), temperature source Oral, resp. rate 16, height '4\' 10"'  (1.473 m), weight 52.028 kg (114 lb 11.2 oz), SpO2 98 %. No results found. Results for orders placed or performed during the hospital encounter of 06/09/16 (from the past 72 hour(s))  Basic metabolic panel     Status: Abnormal   Collection Time: 06/20/16  5:12 AM  Result Value Ref Range   Sodium 131 (L) 135 - 145 mmol/L   Potassium 3.8 3.5 - 5.1 mmol/L   Chloride 96 (L) 101 - 111 mmol/L   CO2 28 22 - 32 mmol/L   Glucose, Bld 84 65 - 99 mg/dL   BUN 7 6 - 20 mg/dL   Creatinine, Ser 0.62 0.44 - 1.00 mg/dL   Calcium 9.1 8.9 - 10.3 mg/dL   GFR calc non Af Amer >60 >60 mL/min   GFR calc Af Amer >60 >60 mL/min    Comment: (NOTE) The eGFR has been calculated using the CKD EPI equation. This calculation has not been validated in all clinical situations. eGFR's persistently <60 mL/min signify possible Chronic Kidney Disease.    Anion gap 7 5 - 15  Basic metabolic panel     Status: Abnormal   Collection Time: 06/21/16  5:53 AM  Result Value Ref Range   Sodium 131 (L) 135 - 145 mmol/L   Potassium 3.4 (L) 3.5 - 5.1 mmol/L   Chloride 96 (L) 101 - 111 mmol/L   CO2 29 22 - 32 mmol/L   Glucose, Bld 84 65 - 99 mg/dL   BUN 9 6 - 20 mg/dL   Creatinine, Ser 0.55 0.44 - 1.00 mg/dL   Calcium 8.7 (L) 8.9 - 10.3 mg/dL   GFR calc non Af Amer >60 >60 mL/min   GFR calc Af Amer >60 >60 mL/min    Comment: (NOTE) The eGFR has been calculated using the CKD EPI equation. This calculation has not been validated in all clinical situations. eGFR's  persistently <60 mL/min signify possible Chronic Kidney Disease.    Anion gap 6 5 - 15     HEENT: normal Cardio: RRR Resp: CTA B/L. No rales or wheezes GI: BS positive and mild distention and tympany Extremity:  Pulses positive and 1+  pedal Edema Skin:   Wound dressed. Small area of serous drainage at inferior aspect of wound. Wound filling in. Dry otherwise.  No odor or discoloration Neuro: Alert/Oriented and Abnormal Motor 4+ in BUE 3- BLE Musc/Skel:  Spastic muscle near superior edge of incision. Fair sitting posture Gen NAD   Assessment/Plan: 1. Functional deficits secondary to Lumbar radiculopathy s/p extension of Lumbar fusion to include L5-S1 as well as T12-L2 ant and post approach which require 3+ hours per day of interdisciplinary therapy in a comprehensive inpatient rehab setting. Physiatrist is providing close team supervision and 24 hour management of active medical problems listed below. Physiatrist and rehab team continue to assess barriers to discharge/monitor patient progress toward functional and medical goals. FIM: Function - Bathing Bathing activity did not occur: N/A Position: Shower Body parts bathed by patient: Right arm, Left arm, Chest, Abdomen, Front perineal area, Buttocks, Right upper leg, Left upper leg,  Right lower leg, Left lower leg Body parts bathed by helper: Back Bathing not applicable: Back Assist Level: Supervision or verbal cues  Function- Upper Body Dressing/Undressing Upper body dressing/undressing activity did not occur: N/A What is the patient wearing?: Pull over shirt/dress, Orthosis Bra - Perfomed by patient: Thread/unthread right bra strap, Thread/unthread left bra strap, Hook/unhook bra (pull down sports bra) Pull over shirt/dress - Perfomed by patient: Thread/unthread right sleeve, Thread/unthread left sleeve, Put head through opening, Pull shirt over trunk Pull over shirt/dress - Perfomed by helper: Put head through opening, Pull  shirt over trunk Orthosis activity level: Performed by patient Assist Level: Set up Set up : To obtain clothing/put away Function - Lower Body Dressing/Undressing Lower body dressing/undressing activity did not occur: N/A What is the patient wearing?: Shoes, Maryln Manuel, Underwear Position:  (Sitting on TTB) Underwear - Performed by patient: Thread/unthread right underwear leg, Thread/unthread left underwear leg, Pull underwear up/down Underwear - Performed by helper: Pull underwear up/down Pants- Performed by patient: Thread/unthread right pants leg, Thread/unthread left pants leg Pants- Performed by helper: Pull pants up/down Non-skid slipper socks- Performed by helper: Don/doff right sock, Don/doff left sock Socks - Performed by helper: Don/doff right sock, Don/doff left sock Shoes - Performed by helper: Don/doff right shoe, Don/doff left shoe TED Hose - Performed by helper: Don/doff right TED hose, Don/doff left TED hose Assist for footwear: Maximal assist Assist for lower body dressing: Supervision or verbal cues  Function - Toileting Toileting steps completed by patient: Adjust clothing prior to toileting, Adjust clothing after toileting, Performs perineal hygiene Toileting steps completed by helper: Performs perineal hygiene Toileting Assistive Devices: Grab bar or rail Assist level: More than reasonable time  Function - Air cabin crew transfer assistive device: Elevated toilet seat/BSC over toilet Assist level to toilet: Supervision or verbal cues Assist level from toilet: Supervision or verbal cues Assist level to bedside commode (at bedside): Supervision or verbal cues Assist level from bedside commode (at bedside): Moderate assist (Pt 50 - 74%/lift or lower)  Function - Chair/bed transfer Chair/bed transfer method: Ambulatory Chair/bed transfer assist level: No Help, no cues, assistive device, takes more than a reasonable amount of time Chair/bed transfer assistive  device: Walker, Armrests, Orthosis Chair/bed transfer details: Verbal cues for precautions/safety, Verbal cues for safe use of DME/AE  Function - Locomotion: Wheelchair Will patient use wheelchair at discharge?: No Function - Locomotion: Ambulation Assistive device: Walker-rolling Max distance: 170' Assist level: No help, No cues, assistive device, takes more than a reasonable amount of time Assist level: No help, No cues, assistive device, takes more than a reasonable amount of time Assist level: No help, No cues, assistive device, takes more than a reasonable amount of time Walk 150 feet activity did not occur: Safety/medical concerns Assist level: No help, No cues, assistive device, takes more than a reasonable amount of time Walk 10 feet on uneven surfaces activity did not occur: Safety/medical concerns Assist level: Supervision or verbal cues  Function - Comprehension Comprehension: Auditory Comprehension assist level: Follows complex conversation/direction with no assist  Function - Expression Expression: Verbal Expression assist level: Expresses complex ideas: With no assist  Function - Social Interaction Social Interaction assist level: Interacts appropriately with others - No medications needed.  Function - Problem Solving Problem solving assist level: Solves complex problems: Recognizes & self-corrects  Function - Memory Memory assist level: Complete Independence: No helper Patient normally able to recall (first 3 days only): Current season   Medical Problem List and Plan:  1.  Decreased functional mobility secondary to lumbar stenosis with radiculopathy. Lumbar corset when out of bed -home today.   -needs NS follow up  -[facility] PT/OT/RN  -Patient to see me in the office for transitional care encounter in 1-2 weeks.  2.  DVT Prophylaxis/Anticoagulation: SCDs. Check vascular studies upon admit.Doppler negative, LE swelling multifactorial venous insuff, paraparesis, added  TH TEDs 3. Pain Management: scheduled oxycodone and ultram---             -kpad effective  -can use muscle rub  -flexeril---q 8 prn. Can continue  -will hold on TPI due to proximity to incision 4. Acute blood loss anemia. Follow-up CBC. Continue iron supplement, Hgb 9.2. Fe++ supp 5. Neuropsych: This patient is capable of making decisions on her own behalf. 6. Skin/Wound Care: Routine skin checks. Drainage decreasing. Sl serous disharge still.  -changed cipro to cover urine too--continue until follow up with NS 7. Fluids/Electrolytes/Nutrition: sodium down to 126  -off maxzide. Sodium remains 131. She'll need to discuss with her PCP after discharge  -follow up electrolytes via [place] on Monday 8. Rheumatoid arthritis/polymyositis. Continue Plaqopped  --continue eleuenil 200 mg daily, prednisone 5 mg daily 9. Hypertension. Lopressor 25 mg twice a day---stctrolyte permitting (sodium/potassium stable today) 10.  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  Urinary inc---rx UTI 12.  Constipation--increased senna-s to two tabs bid 13.  HypoK: on supplement    -continue to monitor/supplement 14. Edema/HTN:  -stopped maxzide. Elevate legs/ TEDS continue 15. Low grade temp: UCX positive for pseudomonas  -changed keflex to cipro---will continue longer for wound covg   LOS (Days) 13 A FACE TO FACE EVALUATION WAS PERFORMED  SWARTZ,ZACHARY T 06/22/2016, 9:02 AM

## 2016-06-25 DIAGNOSIS — M3322 Polymyositis with myopathy: Secondary | ICD-10-CM | POA: Diagnosis not present

## 2016-06-25 DIAGNOSIS — I10 Essential (primary) hypertension: Secondary | ICD-10-CM | POA: Diagnosis not present

## 2016-06-25 DIAGNOSIS — M069 Rheumatoid arthritis, unspecified: Secondary | ICD-10-CM | POA: Diagnosis not present

## 2016-06-25 DIAGNOSIS — Z4789 Encounter for other orthopedic aftercare: Secondary | ICD-10-CM | POA: Diagnosis not present

## 2016-06-26 ENCOUNTER — Telehealth: Payer: Self-pay | Admitting: *Deleted

## 2016-06-26 DIAGNOSIS — Z4789 Encounter for other orthopedic aftercare: Secondary | ICD-10-CM | POA: Diagnosis not present

## 2016-06-26 DIAGNOSIS — M3322 Polymyositis with myopathy: Secondary | ICD-10-CM | POA: Diagnosis not present

## 2016-06-26 DIAGNOSIS — I10 Essential (primary) hypertension: Secondary | ICD-10-CM | POA: Diagnosis not present

## 2016-06-26 DIAGNOSIS — M069 Rheumatoid arthritis, unspecified: Secondary | ICD-10-CM | POA: Diagnosis not present

## 2016-06-26 NOTE — Telephone Encounter (Signed)
Patient declined a hospital f/u visit with Dr. Naaman Plummer.  Patient cites that she is currently satisfied with the care she is receiving from her PCP and her surgeon.  I encouraged the patient to call us if there is anything we can do to help in her recovery process.

## 2016-06-27 DIAGNOSIS — Z4789 Encounter for other orthopedic aftercare: Secondary | ICD-10-CM | POA: Diagnosis not present

## 2016-06-27 DIAGNOSIS — M3322 Polymyositis with myopathy: Secondary | ICD-10-CM | POA: Diagnosis not present

## 2016-06-27 DIAGNOSIS — I10 Essential (primary) hypertension: Secondary | ICD-10-CM | POA: Diagnosis not present

## 2016-06-27 DIAGNOSIS — M069 Rheumatoid arthritis, unspecified: Secondary | ICD-10-CM | POA: Diagnosis not present

## 2016-06-28 DIAGNOSIS — I10 Essential (primary) hypertension: Secondary | ICD-10-CM | POA: Diagnosis not present

## 2016-06-28 DIAGNOSIS — M069 Rheumatoid arthritis, unspecified: Secondary | ICD-10-CM | POA: Diagnosis not present

## 2016-06-28 DIAGNOSIS — M3322 Polymyositis with myopathy: Secondary | ICD-10-CM | POA: Diagnosis not present

## 2016-06-28 DIAGNOSIS — Z4789 Encounter for other orthopedic aftercare: Secondary | ICD-10-CM | POA: Diagnosis not present

## 2016-06-29 ENCOUNTER — Ambulatory Visit: Payer: Medicare HMO | Admitting: Gynecology

## 2016-06-29 DIAGNOSIS — M069 Rheumatoid arthritis, unspecified: Secondary | ICD-10-CM | POA: Diagnosis not present

## 2016-06-29 DIAGNOSIS — Z4789 Encounter for other orthopedic aftercare: Secondary | ICD-10-CM | POA: Diagnosis not present

## 2016-06-29 DIAGNOSIS — R7989 Other specified abnormal findings of blood chemistry: Secondary | ICD-10-CM | POA: Diagnosis not present

## 2016-06-29 DIAGNOSIS — I10 Essential (primary) hypertension: Secondary | ICD-10-CM | POA: Diagnosis not present

## 2016-06-29 DIAGNOSIS — M3322 Polymyositis with myopathy: Secondary | ICD-10-CM | POA: Diagnosis not present

## 2016-06-30 DIAGNOSIS — I10 Essential (primary) hypertension: Secondary | ICD-10-CM | POA: Diagnosis not present

## 2016-06-30 DIAGNOSIS — M069 Rheumatoid arthritis, unspecified: Secondary | ICD-10-CM | POA: Diagnosis not present

## 2016-06-30 DIAGNOSIS — M3322 Polymyositis with myopathy: Secondary | ICD-10-CM | POA: Diagnosis not present

## 2016-06-30 DIAGNOSIS — Z4789 Encounter for other orthopedic aftercare: Secondary | ICD-10-CM | POA: Diagnosis not present

## 2016-07-03 DIAGNOSIS — M5416 Radiculopathy, lumbar region: Secondary | ICD-10-CM | POA: Diagnosis not present

## 2016-07-03 DIAGNOSIS — M3320 Polymyositis, organ involvement unspecified: Secondary | ICD-10-CM | POA: Diagnosis not present

## 2016-07-03 DIAGNOSIS — M810 Age-related osteoporosis without current pathological fracture: Secondary | ICD-10-CM | POA: Diagnosis not present

## 2016-07-03 DIAGNOSIS — M3500 Sicca syndrome, unspecified: Secondary | ICD-10-CM | POA: Diagnosis not present

## 2016-07-03 DIAGNOSIS — M545 Low back pain: Secondary | ICD-10-CM | POA: Diagnosis not present

## 2016-07-03 DIAGNOSIS — M150 Primary generalized (osteo)arthritis: Secondary | ICD-10-CM | POA: Diagnosis not present

## 2016-07-03 DIAGNOSIS — M4120 Other idiopathic scoliosis, site unspecified: Secondary | ICD-10-CM | POA: Diagnosis not present

## 2016-07-03 DIAGNOSIS — M4806 Spinal stenosis, lumbar region: Secondary | ICD-10-CM | POA: Diagnosis not present

## 2016-07-03 DIAGNOSIS — I10 Essential (primary) hypertension: Secondary | ICD-10-CM | POA: Diagnosis not present

## 2016-07-04 DIAGNOSIS — I10 Essential (primary) hypertension: Secondary | ICD-10-CM | POA: Diagnosis not present

## 2016-07-04 DIAGNOSIS — M069 Rheumatoid arthritis, unspecified: Secondary | ICD-10-CM | POA: Diagnosis not present

## 2016-07-04 DIAGNOSIS — Z4789 Encounter for other orthopedic aftercare: Secondary | ICD-10-CM | POA: Diagnosis not present

## 2016-07-04 DIAGNOSIS — M3322 Polymyositis with myopathy: Secondary | ICD-10-CM | POA: Diagnosis not present

## 2016-07-05 ENCOUNTER — Encounter: Payer: Medicare HMO | Admitting: Physical Medicine & Rehabilitation

## 2016-07-05 DIAGNOSIS — Z4789 Encounter for other orthopedic aftercare: Secondary | ICD-10-CM | POA: Diagnosis not present

## 2016-07-05 DIAGNOSIS — M3322 Polymyositis with myopathy: Secondary | ICD-10-CM | POA: Diagnosis not present

## 2016-07-05 DIAGNOSIS — M069 Rheumatoid arthritis, unspecified: Secondary | ICD-10-CM | POA: Diagnosis not present

## 2016-07-05 DIAGNOSIS — I10 Essential (primary) hypertension: Secondary | ICD-10-CM | POA: Diagnosis not present

## 2016-07-06 DIAGNOSIS — Z4789 Encounter for other orthopedic aftercare: Secondary | ICD-10-CM | POA: Diagnosis not present

## 2016-07-06 DIAGNOSIS — M069 Rheumatoid arthritis, unspecified: Secondary | ICD-10-CM | POA: Diagnosis not present

## 2016-07-06 DIAGNOSIS — I10 Essential (primary) hypertension: Secondary | ICD-10-CM | POA: Diagnosis not present

## 2016-07-06 DIAGNOSIS — M3322 Polymyositis with myopathy: Secondary | ICD-10-CM | POA: Diagnosis not present

## 2016-07-24 DIAGNOSIS — H401112 Primary open-angle glaucoma, right eye, moderate stage: Secondary | ICD-10-CM | POA: Diagnosis not present

## 2016-07-24 DIAGNOSIS — H401123 Primary open-angle glaucoma, left eye, severe stage: Secondary | ICD-10-CM | POA: Diagnosis not present

## 2016-07-25 DIAGNOSIS — I10 Essential (primary) hypertension: Secondary | ICD-10-CM | POA: Diagnosis not present

## 2016-07-31 ENCOUNTER — Encounter: Payer: Medicare HMO | Admitting: Gynecology

## 2016-07-31 DIAGNOSIS — E7800 Pure hypercholesterolemia, unspecified: Secondary | ICD-10-CM | POA: Diagnosis not present

## 2016-07-31 DIAGNOSIS — Z0000 Encounter for general adult medical examination without abnormal findings: Secondary | ICD-10-CM | POA: Diagnosis not present

## 2016-07-31 DIAGNOSIS — M810 Age-related osteoporosis without current pathological fracture: Secondary | ICD-10-CM | POA: Diagnosis not present

## 2016-07-31 DIAGNOSIS — E559 Vitamin D deficiency, unspecified: Secondary | ICD-10-CM | POA: Diagnosis not present

## 2016-07-31 DIAGNOSIS — N390 Urinary tract infection, site not specified: Secondary | ICD-10-CM | POA: Diagnosis not present

## 2016-07-31 DIAGNOSIS — I10 Essential (primary) hypertension: Secondary | ICD-10-CM | POA: Diagnosis not present

## 2016-08-03 ENCOUNTER — Ambulatory Visit: Payer: Medicare HMO

## 2016-08-07 DIAGNOSIS — M159 Polyosteoarthritis, unspecified: Secondary | ICD-10-CM | POA: Diagnosis not present

## 2016-08-07 DIAGNOSIS — M3500 Sicca syndrome, unspecified: Secondary | ICD-10-CM | POA: Diagnosis not present

## 2016-08-07 DIAGNOSIS — M810 Age-related osteoporosis without current pathological fracture: Secondary | ICD-10-CM | POA: Diagnosis not present

## 2016-08-07 DIAGNOSIS — Z0001 Encounter for general adult medical examination with abnormal findings: Secondary | ICD-10-CM | POA: Diagnosis not present

## 2016-08-07 DIAGNOSIS — M3320 Polymyositis, organ involvement unspecified: Secondary | ICD-10-CM | POA: Diagnosis not present

## 2016-08-30 DIAGNOSIS — M4135 Thoracogenic scoliosis, thoracolumbar region: Secondary | ICD-10-CM | POA: Diagnosis not present

## 2016-08-30 DIAGNOSIS — M545 Low back pain: Secondary | ICD-10-CM | POA: Diagnosis not present

## 2016-08-30 DIAGNOSIS — M4120 Other idiopathic scoliosis, site unspecified: Secondary | ICD-10-CM | POA: Diagnosis not present

## 2016-08-30 DIAGNOSIS — M5416 Radiculopathy, lumbar region: Secondary | ICD-10-CM | POA: Diagnosis not present

## 2016-08-30 DIAGNOSIS — M4806 Spinal stenosis, lumbar region: Secondary | ICD-10-CM | POA: Diagnosis not present

## 2016-09-04 DIAGNOSIS — Z23 Encounter for immunization: Secondary | ICD-10-CM | POA: Diagnosis not present

## 2016-09-04 DIAGNOSIS — M150 Primary generalized (osteo)arthritis: Secondary | ICD-10-CM | POA: Diagnosis not present

## 2016-09-04 DIAGNOSIS — M3500 Sicca syndrome, unspecified: Secondary | ICD-10-CM | POA: Diagnosis not present

## 2016-09-04 DIAGNOSIS — M3320 Polymyositis, organ involvement unspecified: Secondary | ICD-10-CM | POA: Diagnosis not present

## 2016-09-04 DIAGNOSIS — M810 Age-related osteoporosis without current pathological fracture: Secondary | ICD-10-CM | POA: Diagnosis not present

## 2016-09-12 ENCOUNTER — Ambulatory Visit (INDEPENDENT_AMBULATORY_CARE_PROVIDER_SITE_OTHER): Payer: Medicare HMO | Admitting: Gynecology

## 2016-09-12 ENCOUNTER — Encounter: Payer: Self-pay | Admitting: Gynecology

## 2016-09-12 VITALS — BP 130/76 | Ht 59.0 in | Wt 113.0 lb

## 2016-09-12 DIAGNOSIS — N952 Postmenopausal atrophic vaginitis: Secondary | ICD-10-CM | POA: Diagnosis not present

## 2016-09-12 DIAGNOSIS — N814 Uterovaginal prolapse, unspecified: Secondary | ICD-10-CM

## 2016-09-12 DIAGNOSIS — Z4689 Encounter for fitting and adjustment of other specified devices: Secondary | ICD-10-CM | POA: Diagnosis not present

## 2016-09-12 DIAGNOSIS — N8111 Cystocele, midline: Secondary | ICD-10-CM

## 2016-09-12 DIAGNOSIS — Z01419 Encounter for gynecological examination (general) (routine) without abnormal findings: Secondary | ICD-10-CM | POA: Diagnosis not present

## 2016-09-12 NOTE — Progress Notes (Signed)
    [name] 1946-07-08 [id number]        70 y.o.  [id number] presents for breast and pelvic exam as well as follow up on her pelvic relaxation.  Past medical history,surgical history, problem list, medications, allergies, family history and social history were all reviewed and documented in the EPIC chart.  Directed ROS with pertinent positives and negatives documented in the history of present illness/assessment and plan.  Exam: [name] assistant Vitals:   09/12/16 1428  BP: 130/76  Weight: 113 lb (51.3 kg)  Height: 4\' 11"  (1.499 m)   General appearance:  Normal HEENT normal Lungs clear Cardiac regular rate rubs murmurs or gallops Abdomen soft nontender without masses guarding rebound. Healing incision suprapubically to the left. Both breasts examined lying and sitting without masses, retractions, discharge or adenopathy Pelvic external BUS vagina with atrophic changes. Ring pessary was removed. Slight irritation with atrophic changes but no significant erosion or mucosal damage. Cervix grossly normal. Uterus normal size midline mobile nontender. Adnexa without masses or tenderness.  Ring pessary was removed, cleansed and replaced without difficulty  Assessment/Plan:  70 y.o. [id number] for breast and pelvic exam.    1. 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 Good response to ring pessary. Patient will continue to monitor and follow up in 4-6 months for recheck. 2. Postmenopausal/atrophic genital changes. Without significant hot flushes, night sweats, vaginal dryness or any vaginal bleeding. 3. Mammography due now and I reminded patient to schedule. SBE monthly reviewed. 4. Pap smear 2015. No Pap smear done today. No history of significant abnormal Pap smears previously. 5. Colonoscopy 2013. Repeat at their recommended interval. 6. DEXA 2013 done elsewhere. I do not have a copy of these reports. She'll follow up with her primary physician and her orthopedic  physicians who just did scoliosis surgery on her back to include rod placement as far as bone health management and whether DEXA is of any practical basis given her history. 7. Health maintenance. No routine lab work done as this is done elsewhere. Follow up in 4-6 months for pessary check  Additional time in excess of her breast and pelvic exam was spent in direct face to face counseling and coordination of care in regards to her issues of pelvic relaxation and pessary maintenance.[name] MD, 3:08 PM 09/12/2016

## 2016-09-12 NOTE — Patient Instructions (Signed)
Follow up in 4-6 months for pessary recheck 

## 2016-09-18 ENCOUNTER — Inpatient Hospital Stay: Admission: RE | Admit: 2016-09-18 | Payer: Medicare HMO | Source: Ambulatory Visit

## 2016-09-29 DIAGNOSIS — H401112 Primary open-angle glaucoma, right eye, moderate stage: Secondary | ICD-10-CM | POA: Diagnosis not present

## 2016-09-29 DIAGNOSIS — H401122 Primary open-angle glaucoma, left eye, moderate stage: Secondary | ICD-10-CM | POA: Diagnosis not present

## 2016-10-03 ENCOUNTER — Ambulatory Visit
Admission: RE | Admit: 2016-10-03 | Discharge: 2016-10-03 | Disposition: A | Discharge status: Home / Self Care | Payer: Medicare HMO | Source: Ambulatory Visit | Attending: Gynecology | Admitting: Gynecology

## 2016-10-03 DIAGNOSIS — Z1231 Encounter for screening mammogram for malignant neoplasm of breast: Secondary | ICD-10-CM | POA: Diagnosis not present

## 2016-10-09 DIAGNOSIS — H04123 Dry eye syndrome of bilateral lacrimal glands: Secondary | ICD-10-CM | POA: Diagnosis not present

## 2016-10-09 DIAGNOSIS — M3501 Sicca syndrome with keratoconjunctivitis: Secondary | ICD-10-CM | POA: Diagnosis not present

## 2016-10-18 DIAGNOSIS — J189 Pneumonia, unspecified organism: Secondary | ICD-10-CM | POA: Diagnosis not present

## 2016-10-18 DIAGNOSIS — H109 Unspecified conjunctivitis: Secondary | ICD-10-CM | POA: Diagnosis not present

## 2016-10-18 DIAGNOSIS — J029 Acute pharyngitis, unspecified: Secondary | ICD-10-CM | POA: Diagnosis not present

## 2016-10-25 DIAGNOSIS — J189 Pneumonia, unspecified organism: Secondary | ICD-10-CM | POA: Diagnosis not present

## 2016-10-25 DIAGNOSIS — Z79899 Other long term (current) drug therapy: Secondary | ICD-10-CM | POA: Diagnosis not present

## 2016-10-25 DIAGNOSIS — J181 Lobar pneumonia, unspecified organism: Secondary | ICD-10-CM | POA: Diagnosis not present

## 2016-10-30 DIAGNOSIS — M4120 Other idiopathic scoliosis, site unspecified: Secondary | ICD-10-CM | POA: Diagnosis not present

## 2016-10-30 DIAGNOSIS — M4316 Spondylolisthesis, lumbar region: Secondary | ICD-10-CM | POA: Diagnosis not present

## 2016-10-30 DIAGNOSIS — M5416 Radiculopathy, lumbar region: Secondary | ICD-10-CM | POA: Diagnosis not present

## 2016-10-30 DIAGNOSIS — M4135 Thoracogenic scoliosis, thoracolumbar region: Secondary | ICD-10-CM | POA: Diagnosis not present

## 2016-11-06 DIAGNOSIS — H401122 Primary open-angle glaucoma, left eye, moderate stage: Secondary | ICD-10-CM | POA: Diagnosis not present

## 2016-11-06 DIAGNOSIS — M6281 Muscle weakness (generalized): Secondary | ICD-10-CM | POA: Diagnosis not present

## 2016-11-06 DIAGNOSIS — M545 Low back pain: Secondary | ICD-10-CM | POA: Diagnosis not present

## 2016-11-06 DIAGNOSIS — M4135 Thoracogenic scoliosis, thoracolumbar region: Secondary | ICD-10-CM | POA: Diagnosis not present

## 2016-11-06 DIAGNOSIS — H401112 Primary open-angle glaucoma, right eye, moderate stage: Secondary | ICD-10-CM | POA: Diagnosis not present

## 2016-11-06 DIAGNOSIS — M546 Pain in thoracic spine: Secondary | ICD-10-CM | POA: Diagnosis not present

## 2016-11-14 DIAGNOSIS — M810 Age-related osteoporosis without current pathological fracture: Secondary | ICD-10-CM | POA: Diagnosis not present

## 2016-11-15 DIAGNOSIS — M546 Pain in thoracic spine: Secondary | ICD-10-CM | POA: Diagnosis not present

## 2016-11-15 DIAGNOSIS — M545 Low back pain: Secondary | ICD-10-CM | POA: Diagnosis not present

## 2016-11-15 DIAGNOSIS — M6281 Muscle weakness (generalized): Secondary | ICD-10-CM | POA: Diagnosis not present

## 2016-11-15 DIAGNOSIS — M4135 Thoracogenic scoliosis, thoracolumbar region: Secondary | ICD-10-CM | POA: Diagnosis not present

## 2016-11-20 DIAGNOSIS — M4135 Thoracogenic scoliosis, thoracolumbar region: Secondary | ICD-10-CM | POA: Diagnosis not present

## 2016-11-20 DIAGNOSIS — M545 Low back pain: Secondary | ICD-10-CM | POA: Diagnosis not present

## 2016-11-20 DIAGNOSIS — M546 Pain in thoracic spine: Secondary | ICD-10-CM | POA: Diagnosis not present

## 2016-11-20 DIAGNOSIS — M6281 Muscle weakness (generalized): Secondary | ICD-10-CM | POA: Diagnosis not present

## 2016-11-22 DIAGNOSIS — M545 Low back pain: Secondary | ICD-10-CM | POA: Diagnosis not present

## 2016-11-22 DIAGNOSIS — M6281 Muscle weakness (generalized): Secondary | ICD-10-CM | POA: Diagnosis not present

## 2016-11-22 DIAGNOSIS — M546 Pain in thoracic spine: Secondary | ICD-10-CM | POA: Diagnosis not present

## 2016-11-22 DIAGNOSIS — M4135 Thoracogenic scoliosis, thoracolumbar region: Secondary | ICD-10-CM | POA: Diagnosis not present

## 2016-11-27 DIAGNOSIS — M6281 Muscle weakness (generalized): Secondary | ICD-10-CM | POA: Diagnosis not present

## 2016-11-27 DIAGNOSIS — M545 Low back pain: Secondary | ICD-10-CM | POA: Diagnosis not present

## 2016-11-27 DIAGNOSIS — M4135 Thoracogenic scoliosis, thoracolumbar region: Secondary | ICD-10-CM | POA: Diagnosis not present

## 2016-11-27 DIAGNOSIS — M546 Pain in thoracic spine: Secondary | ICD-10-CM | POA: Diagnosis not present

## 2016-11-29 DIAGNOSIS — M546 Pain in thoracic spine: Secondary | ICD-10-CM | POA: Diagnosis not present

## 2016-11-29 DIAGNOSIS — M6281 Muscle weakness (generalized): Secondary | ICD-10-CM | POA: Diagnosis not present

## 2016-11-29 DIAGNOSIS — M4135 Thoracogenic scoliosis, thoracolumbar region: Secondary | ICD-10-CM | POA: Diagnosis not present

## 2016-11-29 DIAGNOSIS — M545 Low back pain: Secondary | ICD-10-CM | POA: Diagnosis not present

## 2016-12-04 DIAGNOSIS — M546 Pain in thoracic spine: Secondary | ICD-10-CM | POA: Diagnosis not present

## 2016-12-04 DIAGNOSIS — M6281 Muscle weakness (generalized): Secondary | ICD-10-CM | POA: Diagnosis not present

## 2016-12-04 DIAGNOSIS — M4135 Thoracogenic scoliosis, thoracolumbar region: Secondary | ICD-10-CM | POA: Diagnosis not present

## 2016-12-04 DIAGNOSIS — M545 Low back pain: Secondary | ICD-10-CM | POA: Diagnosis not present

## 2016-12-06 DIAGNOSIS — M546 Pain in thoracic spine: Secondary | ICD-10-CM | POA: Diagnosis not present

## 2016-12-06 DIAGNOSIS — M6281 Muscle weakness (generalized): Secondary | ICD-10-CM | POA: Diagnosis not present

## 2016-12-06 DIAGNOSIS — M4135 Thoracogenic scoliosis, thoracolumbar region: Secondary | ICD-10-CM | POA: Diagnosis not present

## 2016-12-06 DIAGNOSIS — M545 Low back pain: Secondary | ICD-10-CM | POA: Diagnosis not present

## 2016-12-13 DIAGNOSIS — M545 Low back pain: Secondary | ICD-10-CM | POA: Diagnosis not present

## 2016-12-13 DIAGNOSIS — M3500 Sicca syndrome, unspecified: Secondary | ICD-10-CM | POA: Diagnosis not present

## 2016-12-13 DIAGNOSIS — M150 Primary generalized (osteo)arthritis: Secondary | ICD-10-CM | POA: Diagnosis not present

## 2016-12-13 DIAGNOSIS — M4135 Thoracogenic scoliosis, thoracolumbar region: Secondary | ICD-10-CM | POA: Diagnosis not present

## 2016-12-13 DIAGNOSIS — M546 Pain in thoracic spine: Secondary | ICD-10-CM | POA: Diagnosis not present

## 2016-12-13 DIAGNOSIS — M3320 Polymyositis, organ involvement unspecified: Secondary | ICD-10-CM | POA: Diagnosis not present

## 2016-12-13 DIAGNOSIS — M810 Age-related osteoporosis without current pathological fracture: Secondary | ICD-10-CM | POA: Diagnosis not present

## 2016-12-13 DIAGNOSIS — M6281 Muscle weakness (generalized): Secondary | ICD-10-CM | POA: Diagnosis not present

## 2016-12-20 DIAGNOSIS — M545 Low back pain: Secondary | ICD-10-CM | POA: Diagnosis not present

## 2016-12-20 DIAGNOSIS — M546 Pain in thoracic spine: Secondary | ICD-10-CM | POA: Diagnosis not present

## 2016-12-20 DIAGNOSIS — M4135 Thoracogenic scoliosis, thoracolumbar region: Secondary | ICD-10-CM | POA: Diagnosis not present

## 2016-12-20 DIAGNOSIS — M6281 Muscle weakness (generalized): Secondary | ICD-10-CM | POA: Diagnosis not present

## 2016-12-25 DIAGNOSIS — M545 Low back pain: Secondary | ICD-10-CM | POA: Diagnosis not present

## 2016-12-25 DIAGNOSIS — M546 Pain in thoracic spine: Secondary | ICD-10-CM | POA: Diagnosis not present

## 2016-12-25 DIAGNOSIS — M4135 Thoracogenic scoliosis, thoracolumbar region: Secondary | ICD-10-CM | POA: Diagnosis not present

## 2016-12-25 DIAGNOSIS — M6281 Muscle weakness (generalized): Secondary | ICD-10-CM | POA: Diagnosis not present

## 2016-12-27 DIAGNOSIS — M4135 Thoracogenic scoliosis, thoracolumbar region: Secondary | ICD-10-CM | POA: Diagnosis not present

## 2016-12-27 DIAGNOSIS — M6281 Muscle weakness (generalized): Secondary | ICD-10-CM | POA: Diagnosis not present

## 2016-12-27 DIAGNOSIS — M546 Pain in thoracic spine: Secondary | ICD-10-CM | POA: Diagnosis not present

## 2016-12-27 DIAGNOSIS — M545 Low back pain: Secondary | ICD-10-CM | POA: Diagnosis not present

## 2017-01-01 DIAGNOSIS — I10 Essential (primary) hypertension: Secondary | ICD-10-CM | POA: Diagnosis not present

## 2017-01-01 DIAGNOSIS — M4135 Thoracogenic scoliosis, thoracolumbar region: Secondary | ICD-10-CM | POA: Diagnosis not present

## 2017-01-01 DIAGNOSIS — M4120 Other idiopathic scoliosis, site unspecified: Secondary | ICD-10-CM | POA: Diagnosis not present

## 2017-01-01 DIAGNOSIS — M6281 Muscle weakness (generalized): Secondary | ICD-10-CM | POA: Diagnosis not present

## 2017-01-01 DIAGNOSIS — M4316 Spondylolisthesis, lumbar region: Secondary | ICD-10-CM | POA: Diagnosis not present

## 2017-01-01 DIAGNOSIS — M545 Low back pain: Secondary | ICD-10-CM | POA: Diagnosis not present

## 2017-01-01 DIAGNOSIS — M546 Pain in thoracic spine: Secondary | ICD-10-CM | POA: Diagnosis not present

## 2017-01-01 DIAGNOSIS — M5416 Radiculopathy, lumbar region: Secondary | ICD-10-CM | POA: Diagnosis not present

## 2017-01-08 DIAGNOSIS — M6281 Muscle weakness (generalized): Secondary | ICD-10-CM | POA: Diagnosis not present

## 2017-01-08 DIAGNOSIS — M546 Pain in thoracic spine: Secondary | ICD-10-CM | POA: Diagnosis not present

## 2017-01-08 DIAGNOSIS — M4135 Thoracogenic scoliosis, thoracolumbar region: Secondary | ICD-10-CM | POA: Diagnosis not present

## 2017-01-08 DIAGNOSIS — M545 Low back pain: Secondary | ICD-10-CM | POA: Diagnosis not present

## 2017-01-10 DIAGNOSIS — M545 Low back pain: Secondary | ICD-10-CM | POA: Diagnosis not present

## 2017-01-10 DIAGNOSIS — M6281 Muscle weakness (generalized): Secondary | ICD-10-CM | POA: Diagnosis not present

## 2017-01-10 DIAGNOSIS — M546 Pain in thoracic spine: Secondary | ICD-10-CM | POA: Diagnosis not present

## 2017-01-10 DIAGNOSIS — M4135 Thoracogenic scoliosis, thoracolumbar region: Secondary | ICD-10-CM | POA: Diagnosis not present

## 2017-01-11 ENCOUNTER — Ambulatory Visit (INDEPENDENT_AMBULATORY_CARE_PROVIDER_SITE_OTHER): Payer: Medicare HMO | Admitting: Gynecology

## 2017-01-11 ENCOUNTER — Encounter: Payer: Self-pay | Admitting: Gynecology

## 2017-01-11 VITALS — BP 124/80

## 2017-01-11 DIAGNOSIS — N8189 Other female genital prolapse: Secondary | ICD-10-CM | POA: Diagnosis not present

## 2017-01-11 NOTE — Patient Instructions (Addendum)
follow up in 4 months for pessary recheck

## 2017-01-11 NOTE — Progress Notes (Signed)
    [name] 06-25-1946 [id number]        71 y.o.  [id number] presents for pessary check. History of cystocele uterine prolapse with good response to ring pessary with support. Is doing well without complaints.  Past medical history,surgical history, problem list, medications, allergies, family history and social history were all reviewed and documented in the EPIC chart.  Directed ROS with pertinent positives and negatives documented in the history of present illness/assessment and plan.  Exam: Deboraha Sprang Vitals:   01/11/17 1208  BP: 124/80   General appearance:  Normal External BUS vagina with atrophic changes. Ring pessary removed without difficulty. Mild irritative mucosal changes but no significant erosions. Cervix with atrophic changes. Bimanual without masses or tenderness. Pessary was cleansed and replaced without difficulty.  Assessment/Plan:  71 y.o. [id number] with good response to ring pessary for cystocele/uterine prolapse. Follow up in 4 months for recheck. Sooner if any issues.    [name] MD, 12:20 PM 01/11/2017

## 2017-01-15 DIAGNOSIS — M4135 Thoracogenic scoliosis, thoracolumbar region: Secondary | ICD-10-CM | POA: Diagnosis not present

## 2017-01-15 DIAGNOSIS — M546 Pain in thoracic spine: Secondary | ICD-10-CM | POA: Diagnosis not present

## 2017-01-15 DIAGNOSIS — M545 Low back pain: Secondary | ICD-10-CM | POA: Diagnosis not present

## 2017-01-15 DIAGNOSIS — M6281 Muscle weakness (generalized): Secondary | ICD-10-CM | POA: Diagnosis not present

## 2017-01-17 DIAGNOSIS — M4135 Thoracogenic scoliosis, thoracolumbar region: Secondary | ICD-10-CM | POA: Diagnosis not present

## 2017-01-17 DIAGNOSIS — M6281 Muscle weakness (generalized): Secondary | ICD-10-CM | POA: Diagnosis not present

## 2017-01-17 DIAGNOSIS — M546 Pain in thoracic spine: Secondary | ICD-10-CM | POA: Diagnosis not present

## 2017-01-17 DIAGNOSIS — M545 Low back pain: Secondary | ICD-10-CM | POA: Diagnosis not present

## 2017-01-22 DIAGNOSIS — M545 Low back pain: Secondary | ICD-10-CM | POA: Diagnosis not present

## 2017-01-22 DIAGNOSIS — M4135 Thoracogenic scoliosis, thoracolumbar region: Secondary | ICD-10-CM | POA: Diagnosis not present

## 2017-01-22 DIAGNOSIS — M546 Pain in thoracic spine: Secondary | ICD-10-CM | POA: Diagnosis not present

## 2017-01-22 DIAGNOSIS — M6281 Muscle weakness (generalized): Secondary | ICD-10-CM | POA: Diagnosis not present

## 2017-02-01 DIAGNOSIS — J019 Acute sinusitis, unspecified: Secondary | ICD-10-CM | POA: Diagnosis not present

## 2017-02-12 DIAGNOSIS — M6281 Muscle weakness (generalized): Secondary | ICD-10-CM | POA: Diagnosis not present

## 2017-02-12 DIAGNOSIS — M546 Pain in thoracic spine: Secondary | ICD-10-CM | POA: Diagnosis not present

## 2017-02-12 DIAGNOSIS — M545 Low back pain: Secondary | ICD-10-CM | POA: Diagnosis not present

## 2017-02-12 DIAGNOSIS — M4135 Thoracogenic scoliosis, thoracolumbar region: Secondary | ICD-10-CM | POA: Diagnosis not present

## 2017-02-14 DIAGNOSIS — M4135 Thoracogenic scoliosis, thoracolumbar region: Secondary | ICD-10-CM | POA: Diagnosis not present

## 2017-02-14 DIAGNOSIS — M546 Pain in thoracic spine: Secondary | ICD-10-CM | POA: Diagnosis not present

## 2017-02-14 DIAGNOSIS — M6281 Muscle weakness (generalized): Secondary | ICD-10-CM | POA: Diagnosis not present

## 2017-02-14 DIAGNOSIS — M545 Low back pain: Secondary | ICD-10-CM | POA: Diagnosis not present

## 2017-02-19 DIAGNOSIS — M4135 Thoracogenic scoliosis, thoracolumbar region: Secondary | ICD-10-CM | POA: Diagnosis not present

## 2017-02-19 DIAGNOSIS — M6281 Muscle weakness (generalized): Secondary | ICD-10-CM | POA: Diagnosis not present

## 2017-02-19 DIAGNOSIS — M545 Low back pain: Secondary | ICD-10-CM | POA: Diagnosis not present

## 2017-02-19 DIAGNOSIS — M546 Pain in thoracic spine: Secondary | ICD-10-CM | POA: Diagnosis not present

## 2017-02-21 DIAGNOSIS — M546 Pain in thoracic spine: Secondary | ICD-10-CM | POA: Diagnosis not present

## 2017-02-21 DIAGNOSIS — M545 Low back pain: Secondary | ICD-10-CM | POA: Diagnosis not present

## 2017-02-21 DIAGNOSIS — M6281 Muscle weakness (generalized): Secondary | ICD-10-CM | POA: Diagnosis not present

## 2017-02-21 DIAGNOSIS — M4135 Thoracogenic scoliosis, thoracolumbar region: Secondary | ICD-10-CM | POA: Diagnosis not present

## 2017-02-28 DIAGNOSIS — M546 Pain in thoracic spine: Secondary | ICD-10-CM | POA: Diagnosis not present

## 2017-02-28 DIAGNOSIS — M4135 Thoracogenic scoliosis, thoracolumbar region: Secondary | ICD-10-CM | POA: Diagnosis not present

## 2017-02-28 DIAGNOSIS — M6281 Muscle weakness (generalized): Secondary | ICD-10-CM | POA: Diagnosis not present

## 2017-02-28 DIAGNOSIS — M545 Low back pain: Secondary | ICD-10-CM | POA: Diagnosis not present

## 2017-03-05 DIAGNOSIS — M545 Low back pain: Secondary | ICD-10-CM | POA: Diagnosis not present

## 2017-03-05 DIAGNOSIS — M6281 Muscle weakness (generalized): Secondary | ICD-10-CM | POA: Diagnosis not present

## 2017-03-05 DIAGNOSIS — M4135 Thoracogenic scoliosis, thoracolumbar region: Secondary | ICD-10-CM | POA: Diagnosis not present

## 2017-03-05 DIAGNOSIS — M546 Pain in thoracic spine: Secondary | ICD-10-CM | POA: Diagnosis not present

## 2017-03-12 DIAGNOSIS — M546 Pain in thoracic spine: Secondary | ICD-10-CM | POA: Diagnosis not present

## 2017-03-12 DIAGNOSIS — M545 Low back pain: Secondary | ICD-10-CM | POA: Diagnosis not present

## 2017-03-12 DIAGNOSIS — M4135 Thoracogenic scoliosis, thoracolumbar region: Secondary | ICD-10-CM | POA: Diagnosis not present

## 2017-03-12 DIAGNOSIS — M6281 Muscle weakness (generalized): Secondary | ICD-10-CM | POA: Diagnosis not present

## 2017-03-13 DIAGNOSIS — M150 Primary generalized (osteo)arthritis: Secondary | ICD-10-CM | POA: Diagnosis not present

## 2017-03-13 DIAGNOSIS — M3320 Polymyositis, organ involvement unspecified: Secondary | ICD-10-CM | POA: Diagnosis not present

## 2017-03-13 DIAGNOSIS — M810 Age-related osteoporosis without current pathological fracture: Secondary | ICD-10-CM | POA: Diagnosis not present

## 2017-03-13 DIAGNOSIS — M3500 Sicca syndrome, unspecified: Secondary | ICD-10-CM | POA: Diagnosis not present

## 2017-03-14 DIAGNOSIS — M545 Low back pain: Secondary | ICD-10-CM | POA: Diagnosis not present

## 2017-03-14 DIAGNOSIS — M546 Pain in thoracic spine: Secondary | ICD-10-CM | POA: Diagnosis not present

## 2017-03-14 DIAGNOSIS — M6281 Muscle weakness (generalized): Secondary | ICD-10-CM | POA: Diagnosis not present

## 2017-03-14 DIAGNOSIS — M4135 Thoracogenic scoliosis, thoracolumbar region: Secondary | ICD-10-CM | POA: Diagnosis not present

## 2017-04-09 DIAGNOSIS — M3501 Sicca syndrome with keratoconjunctivitis: Secondary | ICD-10-CM | POA: Diagnosis not present

## 2017-04-09 DIAGNOSIS — H04123 Dry eye syndrome of bilateral lacrimal glands: Secondary | ICD-10-CM | POA: Diagnosis not present

## 2017-04-09 DIAGNOSIS — H401112 Primary open-angle glaucoma, right eye, moderate stage: Secondary | ICD-10-CM | POA: Diagnosis not present

## 2017-04-09 DIAGNOSIS — H401122 Primary open-angle glaucoma, left eye, moderate stage: Secondary | ICD-10-CM | POA: Diagnosis not present

## 2017-04-11 DIAGNOSIS — H401132 Primary open-angle glaucoma, bilateral, moderate stage: Secondary | ICD-10-CM | POA: Diagnosis not present

## 2017-04-17 DIAGNOSIS — M3500 Sicca syndrome, unspecified: Secondary | ICD-10-CM | POA: Diagnosis not present

## 2017-04-17 DIAGNOSIS — J019 Acute sinusitis, unspecified: Secondary | ICD-10-CM | POA: Diagnosis not present

## 2017-04-23 ENCOUNTER — Encounter: Payer: Self-pay | Admitting: Gastroenterology

## 2017-05-03 ENCOUNTER — Encounter: Payer: Self-pay | Admitting: Gynecology

## 2017-05-03 ENCOUNTER — Ambulatory Visit (INDEPENDENT_AMBULATORY_CARE_PROVIDER_SITE_OTHER): Payer: Medicare HMO | Admitting: Gynecology

## 2017-05-03 VITALS — BP 122/76

## 2017-05-03 DIAGNOSIS — N8189 Other female genital prolapse: Secondary | ICD-10-CM | POA: Diagnosis not present

## 2017-05-03 NOTE — Patient Instructions (Signed)
Follow-up in 6 months for annual exam and pessary check 

## 2017-05-03 NOTE — Progress Notes (Signed)
    [name] 04/02/1946 [id number]        71 y.o.  [id number] presents for pessary check. History of prolapse with good response to ring pessary.  Past medical history,surgical history, problem list, medications, allergies, family history and social history were all reviewed and documented in the EPIC chart.  Directed ROS with pertinent positives and negatives documented in the history of present illness/assessment and plan.  Exam: [name] assistant Vitals:   05/03/17 1206  BP: 122/76   General appearance:  Normal Abdomen soft without masses or tenderness Pelvic external BUS vagina with atrophic changes. Mild irritative changes noted on vaginal mucosa but no significant erosions. Cervix flush with upper vagina.Uterus difficult to palpate but no gross masses or tenderness.  Ring pessary was removed, cleansed and replaced without difficulty.  Assessment/Plan:  71 y.o. [id number] with pelvic relaxation and good response to ring pessary. Follow up in 6 months for annual exam and pessary check.    [name] MD, 12:20 PM 05/03/2017

## 2017-05-10 ENCOUNTER — Ambulatory Visit: Payer: Medicare HMO | Admitting: Gynecology

## 2017-05-30 DIAGNOSIS — M5416 Radiculopathy, lumbar region: Secondary | ICD-10-CM | POA: Diagnosis not present

## 2017-05-30 DIAGNOSIS — M4316 Spondylolisthesis, lumbar region: Secondary | ICD-10-CM | POA: Diagnosis not present

## 2017-05-30 DIAGNOSIS — M545 Low back pain: Secondary | ICD-10-CM | POA: Diagnosis not present

## 2017-05-30 DIAGNOSIS — M4120 Other idiopathic scoliosis, site unspecified: Secondary | ICD-10-CM | POA: Diagnosis not present

## 2017-05-30 DIAGNOSIS — M4135 Thoracogenic scoliosis, thoracolumbar region: Secondary | ICD-10-CM | POA: Diagnosis not present

## 2017-06-04 ENCOUNTER — Encounter: Payer: Self-pay | Admitting: Neurology

## 2017-06-28 DIAGNOSIS — M810 Age-related osteoporosis without current pathological fracture: Secondary | ICD-10-CM | POA: Diagnosis not present

## 2017-06-28 DIAGNOSIS — M3500 Sicca syndrome, unspecified: Secondary | ICD-10-CM | POA: Diagnosis not present

## 2017-06-28 DIAGNOSIS — M549 Dorsalgia, unspecified: Secondary | ICD-10-CM | POA: Diagnosis not present

## 2017-06-28 DIAGNOSIS — M150 Primary generalized (osteo)arthritis: Secondary | ICD-10-CM | POA: Diagnosis not present

## 2017-07-16 DIAGNOSIS — R300 Dysuria: Secondary | ICD-10-CM | POA: Diagnosis not present

## 2017-07-16 DIAGNOSIS — N390 Urinary tract infection, site not specified: Secondary | ICD-10-CM | POA: Diagnosis not present

## 2017-07-16 DIAGNOSIS — R3915 Urgency of urination: Secondary | ICD-10-CM | POA: Diagnosis not present

## 2017-07-19 DIAGNOSIS — M159 Polyosteoarthritis, unspecified: Secondary | ICD-10-CM | POA: Diagnosis not present

## 2017-07-19 DIAGNOSIS — I10 Essential (primary) hypertension: Secondary | ICD-10-CM | POA: Diagnosis not present

## 2017-07-19 DIAGNOSIS — M810 Age-related osteoporosis without current pathological fracture: Secondary | ICD-10-CM | POA: Diagnosis not present

## 2017-07-19 DIAGNOSIS — E7800 Pure hypercholesterolemia, unspecified: Secondary | ICD-10-CM | POA: Diagnosis not present

## 2017-07-22 IMAGING — RF DG ESOPHAGUS
11 of 15 series · 16 of 24 positions shown · non-contrast
Comparison: 03/15/2012

CLINICAL DATA: Dysphagia and GERD.  Prior dilatations.

EXAM:
ESOPHOGRAM/BARIUM SWALLOW
TECHNIQUE: Single contrast examination was performed using  thin barium.
FLUOROSCOPY TIME:  Radiation Exposure Index (as provided by the
fluoroscopic device): 7.6 d Gy cm2

[Series 1: fluoro_barium 2fps_bw · 0.19mm/px · 1 of 2 frames shown (1 of 9)]
[frame 1/2]
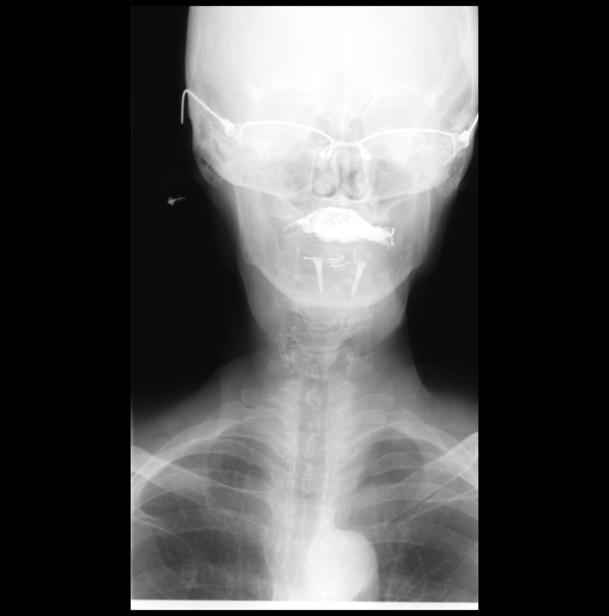

[Series 3: cp_standard · 0.57mm/px · 2 of 7 frames shown (1 of 2)]
[frame 2/7]
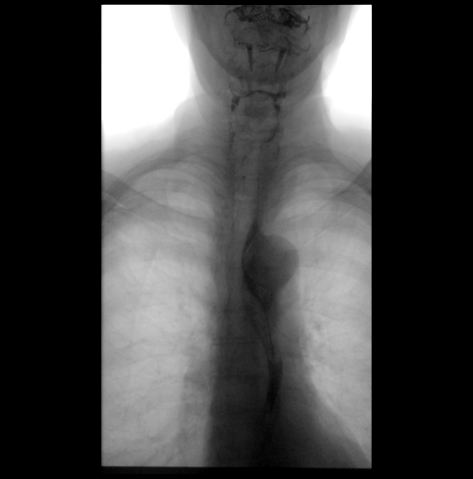
[frame 4/7]
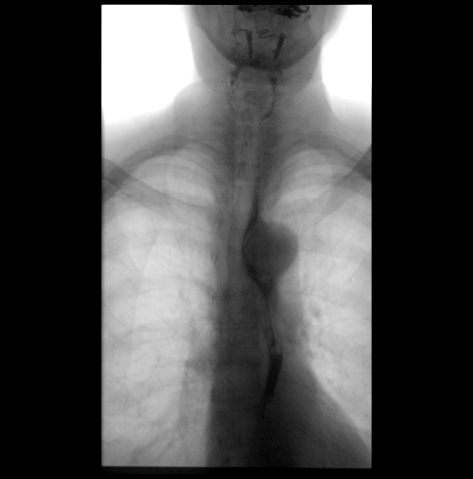

[Series 6: fluoro_barium 2fps_bw · 0.19mm/px · 2 of 2 frames shown (2 of 9)]
[frame 1/2]
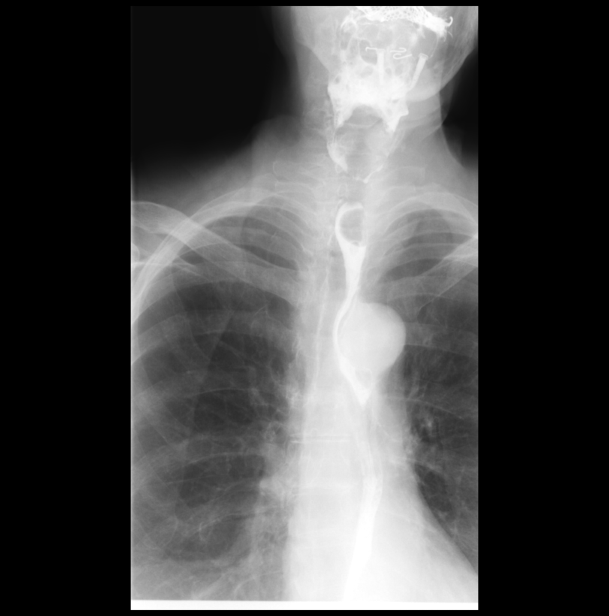
[frame 2/2]
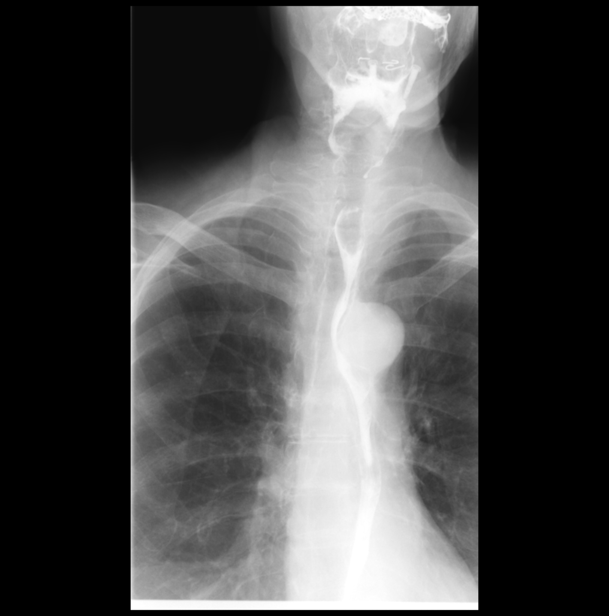

[Series 9: cp_standard · 0.57mm/px · 2 of 21 frames shown (2 of 2)]
[frame 3/21]
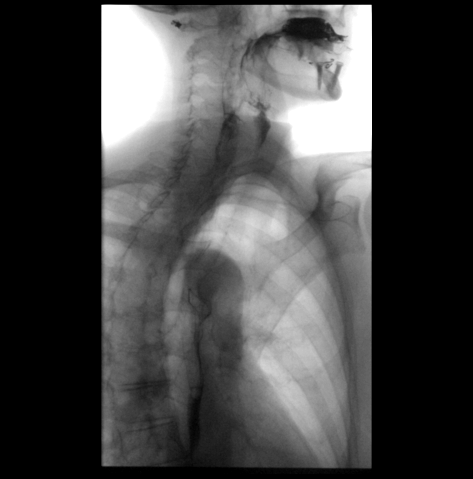
[frame 11/21]
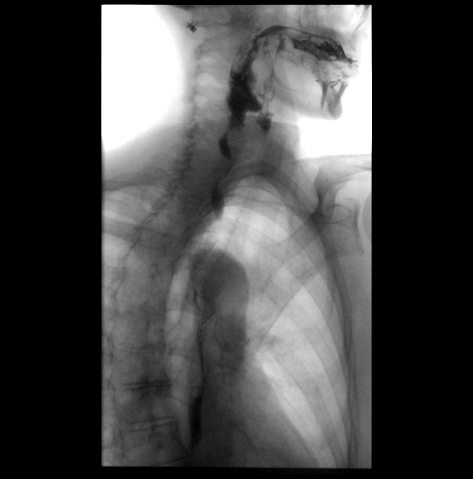

[Series 10: fluoro_barium 2fps_bw · 0.19mm/px · 1 of 4 frames shown (3 of 9)]
[frame 3/4]
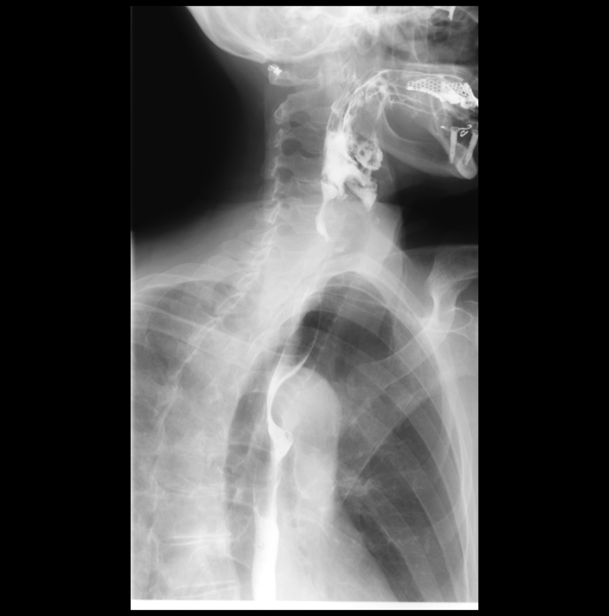

[Series 11: fluoro_barium 2fps_bw · 0.19mm/px · 1 of 8 frames shown (4 of 9)]
[frame 2/8]
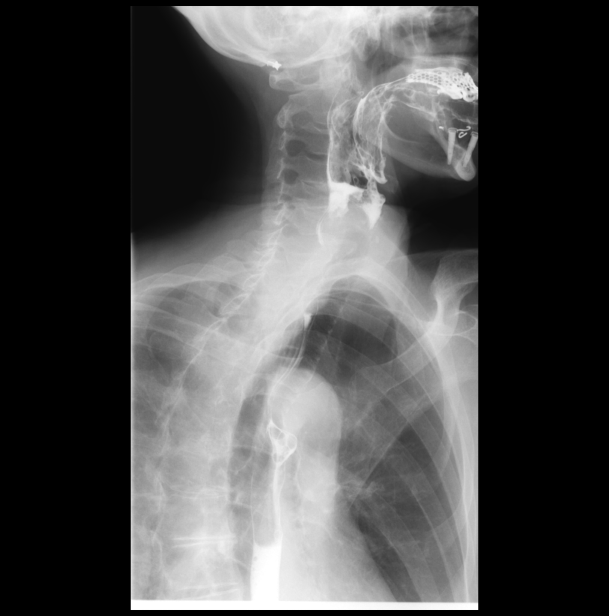

[Series 12: fluoro_barium 2fps_bw · 0.19mm/px · 2 of 5 frames shown (5 of 9)]
[frame 1/5]
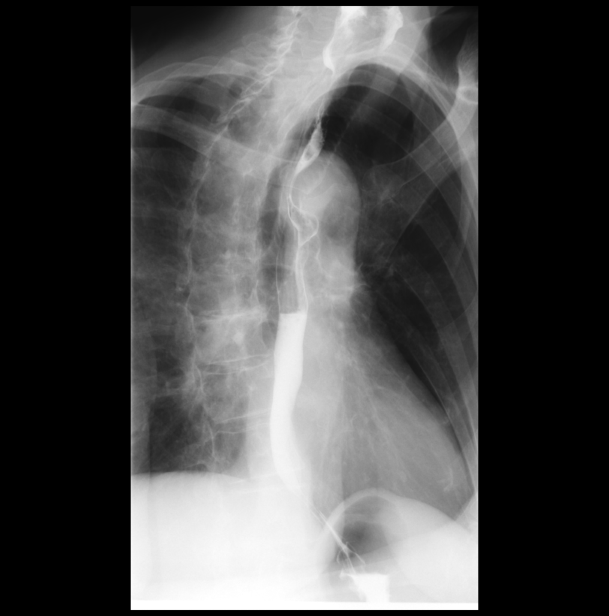
[frame 5/5]
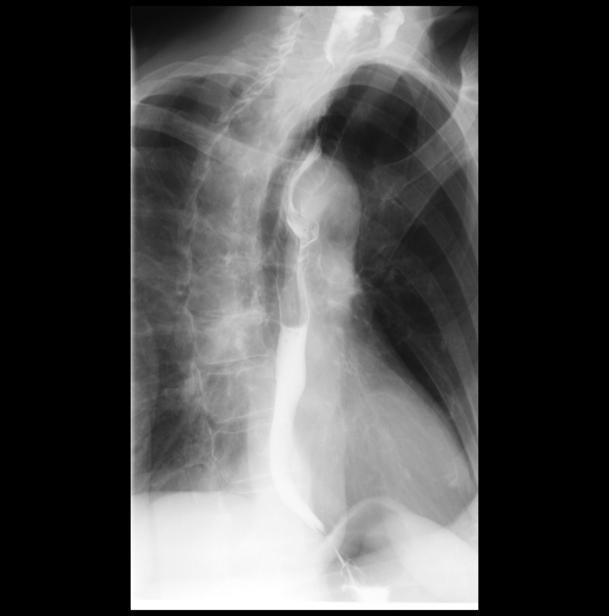

[Series 14: fluoro_barium 2fps_bw · 0.18mm/px · 2 of 8 frames shown (6 of 9)]
[frame 5/8]
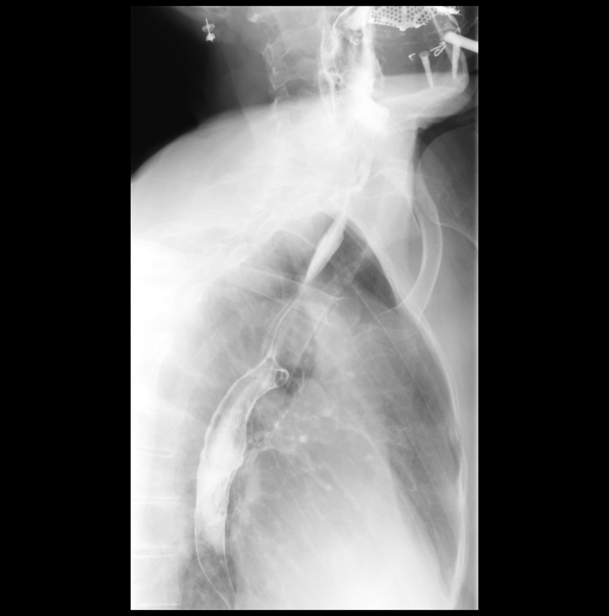
[frame 7/8]
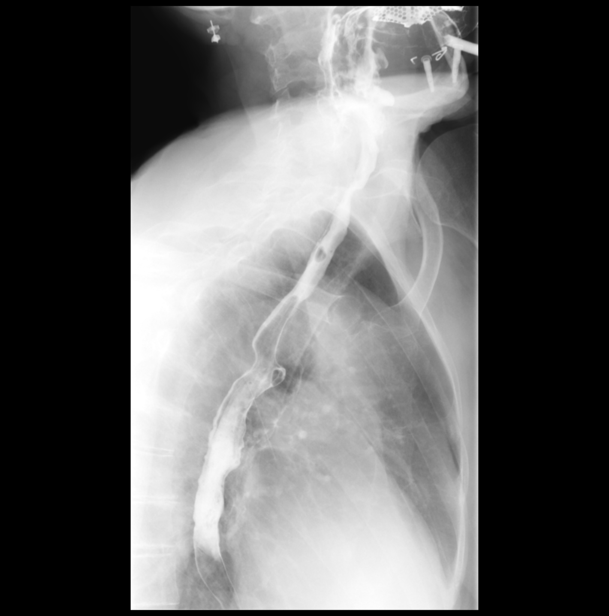

[Series 15: fluoro_barium 2fps_bw · 0.18mm/px · 1 of 9 frames shown (7 of 9)]
[frame 8/9]
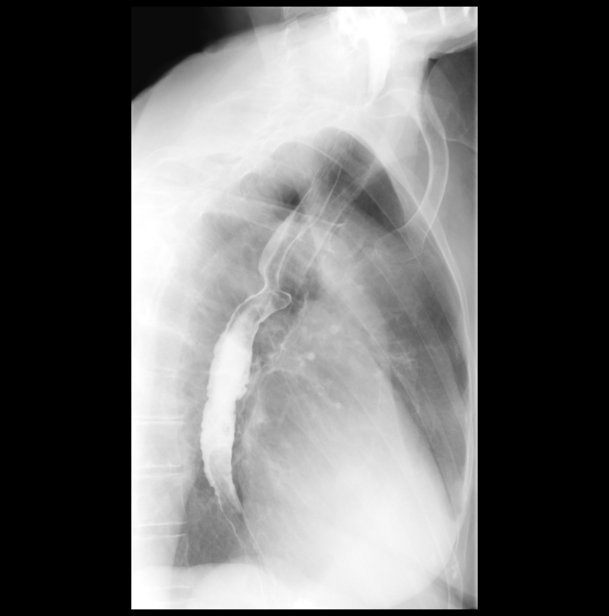

[Series 16: fluoro_barium 2fps_bw · 0.18mm/px · 1 of 5 frames shown (8 of 9)]
[frame 2/5]
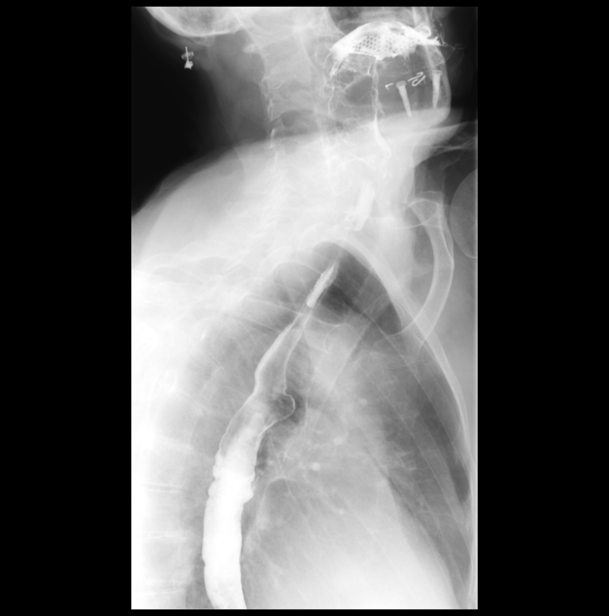

[Series 17: fluoro_barium 2fps_bw · 0.18mm/px · 1 of 1 slices shown (9 of 9)]
[im 1/1]
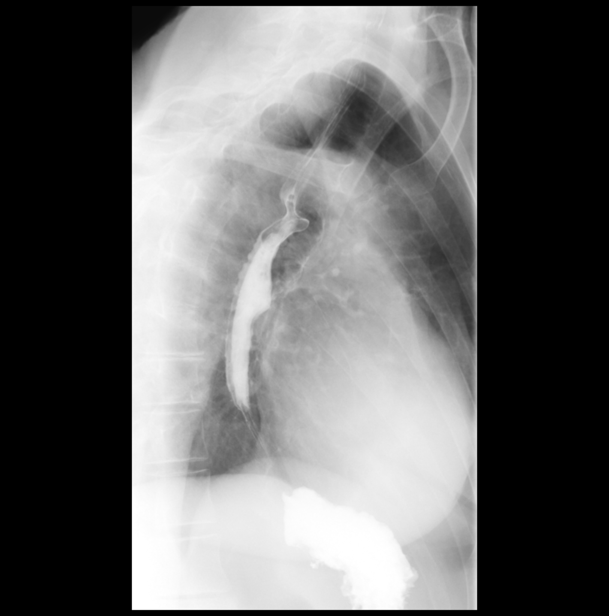

[16 of 24 positions shown; findings below may reference images not displayed]

FINDINGS: Limited evaluation due to difficulty with patient positioning and
patient stability. Patient was imaged in the left lateral position
rather than prone. Patient also declines administration of a barium
tablet.

Moderate mid/lower esophageal dysmotility.

No fixed esophageal narrowing or stricture.

Presence of gastroesophageal reflux could not be assessed.
IMPRESSION: Limited evaluation, as described above.

Moderate mid/lower esophageal dysmotility.

No fixed esophageal narrowing or stricture. However, the patient
declined administration of a barium tablet.

## 2017-08-09 DIAGNOSIS — H401112 Primary open-angle glaucoma, right eye, moderate stage: Secondary | ICD-10-CM | POA: Diagnosis not present

## 2017-08-09 DIAGNOSIS — H401122 Primary open-angle glaucoma, left eye, moderate stage: Secondary | ICD-10-CM | POA: Diagnosis not present

## 2017-08-10 DIAGNOSIS — D72819 Decreased white blood cell count, unspecified: Secondary | ICD-10-CM | POA: Diagnosis not present

## 2017-08-10 DIAGNOSIS — E7800 Pure hypercholesterolemia, unspecified: Secondary | ICD-10-CM | POA: Diagnosis not present

## 2017-08-10 DIAGNOSIS — I10 Essential (primary) hypertension: Secondary | ICD-10-CM | POA: Diagnosis not present

## 2017-08-10 DIAGNOSIS — Z0000 Encounter for general adult medical examination without abnormal findings: Secondary | ICD-10-CM | POA: Diagnosis not present

## 2017-08-10 DIAGNOSIS — E559 Vitamin D deficiency, unspecified: Secondary | ICD-10-CM | POA: Diagnosis not present

## 2017-08-10 DIAGNOSIS — N390 Urinary tract infection, site not specified: Secondary | ICD-10-CM | POA: Diagnosis not present

## 2017-08-13 DIAGNOSIS — D649 Anemia, unspecified: Secondary | ICD-10-CM | POA: Diagnosis not present

## 2017-08-13 DIAGNOSIS — D509 Iron deficiency anemia, unspecified: Secondary | ICD-10-CM | POA: Diagnosis not present

## 2017-08-15 DIAGNOSIS — Z0001 Encounter for general adult medical examination with abnormal findings: Secondary | ICD-10-CM | POA: Diagnosis not present

## 2017-08-15 DIAGNOSIS — M3320 Polymyositis, organ involvement unspecified: Secondary | ICD-10-CM | POA: Diagnosis not present

## 2017-08-15 DIAGNOSIS — M3500 Sicca syndrome, unspecified: Secondary | ICD-10-CM | POA: Diagnosis not present

## 2017-08-15 DIAGNOSIS — I700 Atherosclerosis of aorta: Secondary | ICD-10-CM | POA: Diagnosis not present

## 2017-08-29 DIAGNOSIS — I10 Essential (primary) hypertension: Secondary | ICD-10-CM | POA: Diagnosis not present

## 2017-08-30 ENCOUNTER — Other Ambulatory Visit (INDEPENDENT_AMBULATORY_CARE_PROVIDER_SITE_OTHER): Payer: Medicare HMO

## 2017-08-30 ENCOUNTER — Encounter: Payer: Self-pay | Admitting: Oncology

## 2017-08-30 ENCOUNTER — Ambulatory Visit (INDEPENDENT_AMBULATORY_CARE_PROVIDER_SITE_OTHER): Payer: Medicare HMO | Admitting: Gastroenterology

## 2017-08-30 ENCOUNTER — Telehealth: Payer: Self-pay | Admitting: Oncology

## 2017-08-30 ENCOUNTER — Encounter: Payer: Self-pay | Admitting: Gastroenterology

## 2017-08-30 VITALS — BP 116/70 | HR 68 | Ht 59.0 in | Wt 122.0 lb

## 2017-08-30 DIAGNOSIS — D509 Iron deficiency anemia, unspecified: Secondary | ICD-10-CM | POA: Diagnosis not present

## 2017-08-30 DIAGNOSIS — Z800 Family history of malignant neoplasm of digestive organs: Secondary | ICD-10-CM

## 2017-08-30 LAB — FERRITIN: Ferritin: 16.5 ng/mL (ref 10.0–291.0)

## 2017-08-30 LAB — IGA: IgA: 300 mg/dL (ref 68–378)

## 2017-08-30 MED ORDER — NA SULFATE-K SULFATE-MG SULF 17.5-3.13-1.6 GM/177ML PO SOLN: 1.0000 | Freq: Once | ORAL | 0 refills | Status: AC

## 2017-08-30 NOTE — Patient Instructions (Signed)
If you are age 70 or older, your body mass index should be between 23-30. Your Body mass index is 24.64 kg/m. If this is out of the aforementioned range listed, please consider follow up with your Primary Care Provider.  If you are age 68 or younger, your body mass index should be between 19-25. Your Body mass index is 24.64 kg/m. If this is out of the aformentioned range listed, please consider follow up with your Primary Care Provider.   We have sent the following medications to your pharmacy for you to pick up at your convenience:  [place] have been scheduled for a colonoscopy. Please follow written instructions given to you at your visit today.  Please pick up your prep supplies at the pharmacy within the next 1-3 days. If you use inhalers (even only as needed), please bring them with you on the day of your procedure. Your physician has requested that you go to www.startemmi.com and enter the access code given to you at your visit today. This web site gives a general overview about your procedure. However, you should still follow specific instructions given to you by our office regarding your preparation for the procedure.  Your physician has requested that you go to the basement for the following lab work before leaving today:  [facility]  Split your iron in 2 doses as Alonza Bogus, PA instructed.  If you see no improvement call the office and IV Iron will be considered.  Thank you.

## 2017-08-30 NOTE — Telephone Encounter (Signed)
Appt has been scheduled for the pt to see Dr. Alen Blew on 10/4 at 11:15am. Letter mailed to the pt and faxed to the referring to notify the pt.

## 2017-08-30 NOTE — Progress Notes (Signed)
08/30/2017 [name] [id number] 1946/10/14   HISTORY OF PRESENT ILLNESS:  This is a pleasant 71 year old female who is known to Dr. Havery Moros most recently for issues with swallowing. She has undergone extensive evaluation for that and she now believes it is likely secondary to her myositis diagnosis.  Anyway, she is here today to schedule colonoscopy. Her last colonoscopy was in July 2013 at which time she was found have only diverticulosis. He does have a family history of colon cancer in her father so she undergoes colonoscopy every 5 years. She denies any GI complaints.  She is also known to have long-standing issues with anemia/iron deficiency.  Has been evaluated by GI and hematology in the past with unremarkable results.  Apparently hematology thought maybe she was having issues absorbing the iron.  Hgb is stable currently.  Iron studies slightly low with total iron of 33 and % sat of 9.  I actually was not able to find a ferritin level on her.  Her MCV is normal.  She is on oral iron via elixir since she cannot swallow pills.  Is not tolerating the oral iron well.     Past Medical History:  Diagnosis Date  . AAA (abdominal aortic aneurysm) ([place])    small      report in epic 11/11 states no aaa    . Chronic lower back pain    "for 6 years; relieved since back OR 07/2013" (09/08/2013)  . Difficult intubation    "ive been told im small", no problems listed with back sx in past  . Diverticulosis   . Family history of anesthesia complication    "sister has severe sleep apnea; she has alot of trouble w/anesthesia" (09/08/2013)  . Gastritis   . GERD (gastroesophageal reflux disease)   . Glaucoma   . Hiatal hernia   . High cholesterol    "not severe; can't take the medicine" (09/08/2013)  . History of hiatal hernia    "small one"  . HTN (hypertension)   . Iron deficiency anemia    hx  . Migraines    "hurt like a migraine; don't get them like I did when I was younger"  (09/08/2013)  . Multiple thyroid nodules   . OA (osteoarthritis)   . Osteoporosis   . Pancreatitis   . PHN (postherpetic neuralgia)    right side from shingles  . Polymyositis ([place])   . Raynaud phenomenon   . 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 Shingles   . Sjogren's syndrome ([place])   . Swallowing difficulty    unable to swallow whole pills  . Swelling of left lower extremity    Past Surgical History:  Procedure Laterality Date  . ABDOMINAL EXPOSURE N/A 06/02/2016   Procedure: ABDOMINAL EXPOSURE;  Surgeon: Rosetta Posner, MD;  Location: [facility];  Service: Vascular;  Laterality: N/A;  . ANTERIOR LAT LUMBAR FUSION  06/02/2016   Procedure: Thoracic twelve-Lumbar one, Lumbar one-two anterior Lateral Interbody Fusion;  Surgeon: Erline Levine, MD;  Location: [facility];  Service: Neurosurgery;;  . ANTERIOR LUMBAR FUSION N/A 06/02/2016   Procedure: Lumbar five-sacral one Anterior lumbar interbody fusion with Dr. Donnetta Hutching for approach;  Surgeon: Erline Levine, MD;  Location: [facility];  Service: Neurosurgery;  Laterality: N/A;  . APPLICATION OF INTRAOPERATIVE CT SCAN N/A 06/02/2016   Procedure: APPLICATION OF INTRAOPERATIVE CT SCAN;  Surgeon: Erline Levine, MD;  Location: [facility];  Service: Neurosurgery;  Laterality: N/A;  . CATARACT  EXTRACTION W/ INTRAOCULAR LENS  IMPLANT, BILATERAL Bilateral 12/2010-01/2011  . DENTAL SURGERY  11/2011-12/2011   one on each side in my bottom jaw; dental implants" (09/08/2013)  . ESOPHAGEAL DILATION     "a few times before 02/2010; not since" (09/08/2013)  . ESOPHAGEAL MANOMETRY N/A 02/25/2016   Procedure: ESOPHAGEAL MANOMETRY (EM);  Surgeon: Manus Gunning, MD;  Location: [facility];  Service: Gastroenterology;  Laterality: N/A;  . ESOPHAGOGASTRODUODENOSCOPY    . LUMBAR FUSION  Aug 14, 2013   Dr. Consuello Masse  . POSTERIOR LUMBAR FUSION 4 LEVEL Bilateral 06/02/2016   Procedure: Exploration of Fusion Lumbar two-five, Thoracic ten-ileum posterior fusion,  Posterolateral arthrodesis with AIRO;  Surgeon: Erline Levine, MD;  Location: [facility];  Service: Neurosurgery;  Laterality: Bilateral;  . REPAIR ZENKER'S DIVERTICULA  02/2010   "diverticulectomy w/zenker's pouch correction" (09/08/2013)    reports that she has never smoked. She has never used smokeless tobacco. She reports that she does not drink alcohol or use drugs. family history includes Arthritis in her maternal uncle and sister; Breast cancer in her father; Colon cancer (age of onset: 62) in her father; Diabetes in her sister and son; Heart disease in her brother and father; Hypertension in her father; Ovarian cancer in her mother; Psoriasis in her sister; Sarcoidosis in her sister. Allergies  Allergen Reactions  . Phenylephrine     Other reaction(s): Other (See Comments) -- phenylephrine eye drops only  Eye irritation, severe photophobia      Outpatient Encounter Prescriptions as of 08/30/2017  Medication Sig  . acetaminophen (TYLENOL) 160 MG/5ML elixir Take 500 mg by mouth every 4 (four) hours as needed for fever. Reported on 05/16/2016  . aspirin 81 MG chewable tablet Chew 1 tablet (81 mg total) by mouth daily.  . ferrous sulfate 220 (44 Fe) MG/5ML solution Take 220 mg by mouth daily.  . hydroxychloroquine (PLAQUENIL) 200 MG tablet Take 200 mg by mouth daily.  Marland Kitchen LOSARTAN POTASSIUM PO Take 50 mg by mouth daily.  . predniSONE (DELTASONE) 5 MG tablet Take 1 tablet (5 mg total) by mouth daily with breakfast. (Patient taking differently: Take 2 mg by mouth daily with breakfast. )  . Tetrahydrozoline HCl (VISINE OP) Place 1 drop into both eyes daily as needed (dry eyes). Reported on 05/16/2016  . timolol (BETIMOL) 0.5 % ophthalmic solution Place 1 drop into both eyes at bedtime.  . traMADol (ULTRAM) 50 MG tablet Take 1 tablet (50 mg total) by mouth 4 (four) times daily - after meals and at bedtime.  . Wheat Dextrin (BENEFIBER PO) Take by mouth daily.  . [DISCONTINUED] Calcium-Vitamin  D-Vitamin K (VIACTIV PO) Take 2 tablets by mouth daily. Reported on 05/16/2016  . [DISCONTINUED] docusate (COLACE) 50 MG/5ML liquid Take 10 mLs (100 mg total) by mouth 2 (two) times daily.   No facility-administered encounter medications on file as of 08/30/2017.      REVIEW OF SYSTEMS  : All other systems reviewed and negative except where noted in the History of Present Illness.   PHYSICAL EXAM: BP 116/70   Pulse 68   Ht 4\' 11"  (1.499 m)   Wt 122 lb (55.3 kg)   BMI 24.64 kg/m  General: Well developed white female in no acute distress Head: Normocephalic and atraumatic Eyes:  Sclerae anicteric, conjunctiva pink. Ears: Normal auditory acuity Lungs: Clear throughout to auscultation; no increased WOB. Heart: Regular rate and rhythm; no M/R/G. Abdomen: Soft, non-distended.  BS present.  Non-tender. Rectal:  Will be done at  the time of colonoscopy. Musculoskeletal: Symmetrical with no gross deformities  Skin: No lesions on visible extremities Extremities: No edema  Neurological: Alert oriented x 4, grossly non-focal Psychological:  Alert and cooperative. Normal mood and affect  ASSESSMENT AND PLAN: -FH of colon cancer:  Due for surveillance colonoscopy.  Will schedule with Dr. Havery Moros. -Long-standing IDA:  Has been evaluated by GI and hematology in the past with unremarkable results.  Apparently hematology thought maybe she was having issues absorbing the iron.  Hgb is stable.  Iron studies slightly low, but ferritin not checked so we will order that today.  She is on oral iron via elixir since she cannot swallow pills.  Is not tolerating the oral iron well.  She will try to split it into two doses daily.  If that is still not tolerated then I asked her to call back here and we can consider iron infusion.  Will check celiac labs.  *The risks, benefits, and alternatives to colonoscopy were discussed with the patient and she consents to proceed.   CC:  Deland Pretty, MD

## 2017-08-31 LAB — TISSUE TRANSGLUTAMINASE, IGA: (TTG) AB, IGA: 1 U/mL

## 2017-09-03 ENCOUNTER — Ambulatory Visit: Payer: Medicare HMO | Admitting: Neurology

## 2017-09-03 NOTE — Progress Notes (Signed)
Agree with assessment and plan as outlined.  

## 2017-09-04 DIAGNOSIS — M810 Age-related osteoporosis without current pathological fracture: Secondary | ICD-10-CM | POA: Diagnosis not present

## 2017-09-04 DIAGNOSIS — I10 Essential (primary) hypertension: Secondary | ICD-10-CM | POA: Diagnosis not present

## 2017-09-12 ENCOUNTER — Encounter: Payer: Medicare HMO | Admitting: Oncology

## 2017-09-12 ENCOUNTER — Encounter: Payer: Self-pay | Admitting: Gastroenterology

## 2017-09-12 DIAGNOSIS — H401122 Primary open-angle glaucoma, left eye, moderate stage: Secondary | ICD-10-CM | POA: Diagnosis not present

## 2017-09-12 DIAGNOSIS — H401112 Primary open-angle glaucoma, right eye, moderate stage: Secondary | ICD-10-CM | POA: Diagnosis not present

## 2017-09-14 ENCOUNTER — Other Ambulatory Visit: Payer: Self-pay | Admitting: Gynecology

## 2017-09-14 ENCOUNTER — Telehealth: Payer: Self-pay | Admitting: Gastroenterology

## 2017-09-14 DIAGNOSIS — Z1231 Encounter for screening mammogram for malignant neoplasm of breast: Secondary | ICD-10-CM

## 2017-09-17 DIAGNOSIS — M810 Age-related osteoporosis without current pathological fracture: Secondary | ICD-10-CM | POA: Diagnosis not present

## 2017-09-17 DIAGNOSIS — M159 Polyosteoarthritis, unspecified: Secondary | ICD-10-CM | POA: Diagnosis not present

## 2017-09-17 DIAGNOSIS — H409 Unspecified glaucoma: Secondary | ICD-10-CM | POA: Diagnosis not present

## 2017-09-17 DIAGNOSIS — I10 Essential (primary) hypertension: Secondary | ICD-10-CM | POA: Diagnosis not present

## 2017-09-17 DIAGNOSIS — E7800 Pure hypercholesterolemia, unspecified: Secondary | ICD-10-CM | POA: Diagnosis not present

## 2017-09-17 NOTE — Telephone Encounter (Signed)
I think you were working with Janett Billow this day. Can you help her?

## 2017-09-18 DIAGNOSIS — E876 Hypokalemia: Secondary | ICD-10-CM | POA: Diagnosis not present

## 2017-09-19 NOTE — Telephone Encounter (Signed)
I will create another emmi for pt. At this time I have had to contact emmi manager to reset my password.  I will contact pt when it is reset. Pts procedure is on 10/10

## 2017-09-20 ENCOUNTER — Encounter: Payer: Medicare HMO | Admitting: Oncology

## 2017-09-21 NOTE — Telephone Encounter (Signed)
New Emmi was created and emailed to pt.

## 2017-09-26 ENCOUNTER — Ambulatory Visit (AMBULATORY_SURGERY_CENTER): Payer: Medicare HMO | Admitting: Gastroenterology

## 2017-09-26 ENCOUNTER — Encounter: Payer: Self-pay | Admitting: Gastroenterology

## 2017-09-26 VITALS — BP 131/79 | HR 87 | Temp 98.0°F | Resp 14 | Ht 59.0 in | Wt 122.0 lb

## 2017-09-26 DIAGNOSIS — Z800 Family history of malignant neoplasm of digestive organs: Secondary | ICD-10-CM

## 2017-09-26 DIAGNOSIS — I10 Essential (primary) hypertension: Secondary | ICD-10-CM | POA: Diagnosis not present

## 2017-09-26 DIAGNOSIS — I7300 Raynaud's syndrome without gangrene: Secondary | ICD-10-CM | POA: Diagnosis not present

## 2017-09-26 DIAGNOSIS — Z1211 Encounter for screening for malignant neoplasm of colon: Secondary | ICD-10-CM

## 2017-09-26 DIAGNOSIS — K449 Diaphragmatic hernia without obstruction or gangrene: Secondary | ICD-10-CM | POA: Diagnosis not present

## 2017-09-26 DIAGNOSIS — K6389 Other specified diseases of intestine: Secondary | ICD-10-CM

## 2017-09-26 DIAGNOSIS — D123 Benign neoplasm of transverse colon: Secondary | ICD-10-CM

## 2017-09-26 MED ORDER — SODIUM CHLORIDE 0.9 % IV SOLN: 500.0000 mL | INTRAVENOUS | Status: DC

## 2017-09-26 NOTE — Progress Notes (Signed)
Called to room to assist during endoscopic procedure.  Patient ID and intended procedure confirmed with present staff. Received instructions for my participation in the procedure from the performing physician.  

## 2017-09-26 NOTE — Op Note (Signed)
[facility] Patient Name: [name] Procedure Date: 09/26/2017 9:21 AM MRN: [id number] Endoscopist: Remo Lipps P. Louie Meaders MD, MD Age: 71 Referring MD:  Date of Birth: 1946-02-28 Gender: Female Account #: [id number] Procedure:                Colonoscopy Indications:              Screening in patient at increased risk: Family                            history of 1st-degree relative with colorectal                            cancer before age 69 years (patient reports father                            had colon cancer age 4, previously reported                            diagnosis at age 46s), also noted to have iron                            deficiency. Medicines:                Monitored Anesthesia Care Procedure:                Pre-Anesthesia Assessment:                           - Prior to the procedure, a History and Physical                            was performed, and patient medications and                            allergies were reviewed. The patient's tolerance of                            previous anesthesia was also reviewed. The risks                            and benefits of the procedure and the sedation                            options and risks were discussed with the patient.                            All questions were answered, and informed consent                            was obtained. Prior Anticoagulants: The patient has                            taken no previous anticoagulant or antiplatelet  agents. ASA Grade Assessment: III - A patient with                            severe systemic disease. After reviewing the risks                            and benefits, the patient was deemed in                            satisfactory condition to undergo the procedure.                           After obtaining informed consent, the colonoscope                            was passed under direct vision. Throughout the                            procedure, the patient's blood pressure, pulse, and                            oxygen saturations were monitored continuously. The                            Colonoscope was introduced through the anus and                            advanced to the the terminal ileum, with                            identification of the appendiceal orifice and IC                            valve. The colonoscopy was performed without                            difficulty. The patient tolerated the procedure                            well. The quality of the bowel preparation was                            good. The terminal ileum, ileocecal valve,                            appendiceal orifice, and rectum were photographed. Scope In: 9:29:45 AM Scope Out: 9:48:37 AM Scope Withdrawal Time: 0 hours 13 minutes 34 seconds  Total Procedure Duration: 0 hours 18 minutes 52 seconds  Findings:                 The perianal and digital rectal examinations were                            normal.  The terminal ileum appeared normal.                           A 3 mm polyp was found in the transverse colon. The                            polyp was sessile. The polyp was removed with a                            cold snare. Resection and retrieval were complete.                           Multiple medium-mouthed diverticula were found in                            the left colon and right colon.                           Internal hemorrhoids were found during retroflexion.                           The exam was otherwise without abnormality. Complications:            No immediate complications. Estimated blood loss:                            Minimal. Estimated Blood Loss:     Estimated blood loss was minimal. Impression:               - The examined portion of the ileum was normal.                           - One 3 mm polyp in the transverse colon, removed                             with a cold snare. Resected and retrieved.                           - Diverticulosis in the left colon and in the right                            colon.                           - Internal hemorrhoids.                           - The examination was otherwise normal.                           No cause for iron deficiency on colonoscopy. Recommendation:           - Patient has a contact number available for                            emergencies. The  signs and symptoms of potential                            delayed complications were discussed with the                            patient. Return to normal activities tomorrow.                            Written discharge instructions were provided to the                            patient.                           - Resume previous diet.                           - Continue present medications.                           - Await pathology results.                           - Repeat colonoscopy in 5 years for surveillance.                           - Recommend IV iron repletion given patient's                            intolerance of oral iron                           - Consideration for capsule endoscopy to evaluate                            the small bowel - EGD last year without cause for                            anemia Lamichael Youkhana P. Arnita Koons MD, MD 09/26/2017 9:56:07 AM This report has been signed electronically.

## 2017-09-26 NOTE — Patient Instructions (Addendum)
**   Handouts given on polyps, hemorrhoids and diverticulosis **   YOU HAD AN ENDOSCOPIC PROCEDURE TODAY AT [facility]:   Refer to the procedure report that was given to you for any specific questions about what was found during the examination.  If the procedure report does not answer your questions, please call your gastroenterologist to clarify.  If you requested that your care partner not be given the details of your procedure findings, then the procedure report has been included in a sealed envelope for you to review at your convenience later.  YOU SHOULD EXPECT: Some feelings of bloating in the abdomen. Passage of more gas than usual.  Walking can help get rid of the air that was put into your GI tract during the procedure and reduce the bloating. If you had a lower endoscopy (such as a colonoscopy or flexible sigmoidoscopy) you may notice spotting of blood in your stool or on the toilet paper. If you underwent a bowel prep for your procedure, you may not have a normal bowel movement for a few days.  Please Note:  You might notice some irritation and congestion in your nose or some drainage.  This is from the oxygen used during your procedure.  There is no need for concern and it should clear up in a day or so.  SYMPTOMS TO REPORT IMMEDIATELY:   Following lower endoscopy (colonoscopy or flexible sigmoidoscopy):  Excessive amounts of blood in the stool  Significant tenderness or worsening of abdominal pains  Swelling of the abdomen that is new, acute  Fever of 100F or higher For urgent or emergent issues, a gastroenterologist can be reached at any hour by calling [phone].   DIET:  We do recommend a small meal at first, but then you may proceed to your regular diet.  Drink plenty of fluids but you should avoid alcoholic beverages for 24 hours.  ACTIVITY:  You should plan to take it easy for the rest of today and you should NOT DRIVE or use heavy machinery until  tomorrow (because of the sedation medicines used during the test).    FOLLOW UP: Our staff will call the number listed on your records the next business day following your procedure to check on you and address any questions or concerns that you may have regarding the information given to you following your procedure. If we do not reach you, we will leave a message.  However, if you are feeling well and you are not experiencing any problems, there is no need to return our call.  We will assume that you have returned to your regular daily activities without incident.  If any biopsies were taken you will be contacted by phone or by letter within the next 1-3 weeks.  Please call us at [phone] if you have not heard about the biopsies in 3 weeks.    SIGNATURES/CONFIDENTIALITY: You and/or your care partner have signed paperwork which will be entered into your electronic medical record.  These signatures attest to the fact that that the information above on your After Visit Summary has been reviewed and is understood.  Full responsibility of the confidentiality of this discharge information lies with you and/or your care-partner.

## 2017-09-26 NOTE — Progress Notes (Signed)
Report to PACU, RN, vss, BBS= Clear.  

## 2017-09-27 ENCOUNTER — Telehealth: Payer: Self-pay | Admitting: *Deleted

## 2017-09-27 ENCOUNTER — Telehealth: Payer: Self-pay

## 2017-09-27 NOTE — Telephone Encounter (Signed)
  Follow up Call-  Call back number 09/26/2017 03/06/2016  Post procedure Call Back phone  # [phone]  Permission to leave phone message Yes Yes  Some recent data might be hidden     Patient questions:  Do you have a fever, pain , or abdominal swelling? No. Pain Score  0 *  Have you tolerated food without any problems? Yes.    Have you been able to return to your normal activities? Yes.    Do you have any questions about your discharge instructions: Diet   No. Medications  No. Follow up visit  No.  Do you have questions or concerns about your Care? No.  Actions: * If pain score is 4 or above: No action needed, pain <4.

## 2017-09-27 NOTE — Telephone Encounter (Signed)
Called and spoke to patient. She understands your recommendation and is in agreement. She confirmed her appt next week with Hematology .

## 2017-09-27 NOTE — Telephone Encounter (Signed)
Attempted to reach pt. With follow up call following endoscopic procedure 09/26/2017.   No. Busy.   Will try to reach pt. Again later today.

## 2017-09-27 NOTE — Telephone Encounter (Signed)
-----   Message from Yetta Flock, MD sent at 09/27/2017  8:06 AM EDT ----- Sorry I should have been more clear. Can you let her know I want to await her Hematology visit first prior to giving her IV iron, she had asked about coordinating this prior to her visit with them but she is seeing them next week, defer to their recommendation. Thanks    ----- Message ----- From: Roetta Sessions, CMA Sent: 09/27/2017   7:56 AM To: Yetta Flock, MD  Ok. Thank you.  Is there anything you wanted me to do at this point?   Jan   ----- Message ----- From: Yetta Flock, MD Sent: 09/26/2017   4:16 PM To: Roetta Sessions, CMA  Jan, I performed a colonoscopy on this patient today - it was relatively normal. I was looking through her chart, we had discussed possibly starting IV iron. I would like to await her consultation with Hematology which is scheduled for next week, they can decide if she needs IV iron. We may consider capsule but would like to hear what Hematology has to say first. Thanks

## 2017-09-28 DIAGNOSIS — M549 Dorsalgia, unspecified: Secondary | ICD-10-CM | POA: Diagnosis not present

## 2017-09-28 DIAGNOSIS — M150 Primary generalized (osteo)arthritis: Secondary | ICD-10-CM | POA: Diagnosis not present

## 2017-09-28 DIAGNOSIS — M609 Myositis, unspecified: Secondary | ICD-10-CM | POA: Diagnosis not present

## 2017-09-28 DIAGNOSIS — M810 Age-related osteoporosis without current pathological fracture: Secondary | ICD-10-CM | POA: Diagnosis not present

## 2017-09-28 DIAGNOSIS — Z23 Encounter for immunization: Secondary | ICD-10-CM | POA: Diagnosis not present

## 2017-09-28 DIAGNOSIS — M3500 Sicca syndrome, unspecified: Secondary | ICD-10-CM | POA: Diagnosis not present

## 2017-09-28 DIAGNOSIS — M2550 Pain in unspecified joint: Secondary | ICD-10-CM | POA: Diagnosis not present

## 2017-10-03 ENCOUNTER — Ambulatory Visit (HOSPITAL_BASED_OUTPATIENT_CLINIC_OR_DEPARTMENT_OTHER): Payer: Medicare HMO

## 2017-10-03 ENCOUNTER — Ambulatory Visit (HOSPITAL_BASED_OUTPATIENT_CLINIC_OR_DEPARTMENT_OTHER): Payer: Medicare HMO | Admitting: Oncology

## 2017-10-03 ENCOUNTER — Telehealth: Payer: Self-pay | Admitting: Oncology

## 2017-10-03 VITALS — BP 163/81 | HR 70 | Temp 97.5°F | Resp 16 | Ht 59.0 in | Wt 122.6 lb

## 2017-10-03 DIAGNOSIS — D509 Iron deficiency anemia, unspecified: Secondary | ICD-10-CM

## 2017-10-03 DIAGNOSIS — D508 Other iron deficiency anemias: Secondary | ICD-10-CM | POA: Diagnosis not present

## 2017-10-03 DIAGNOSIS — M359 Systemic involvement of connective tissue, unspecified: Secondary | ICD-10-CM | POA: Diagnosis not present

## 2017-10-03 DIAGNOSIS — E538 Deficiency of other specified B group vitamins: Secondary | ICD-10-CM | POA: Diagnosis not present

## 2017-10-03 LAB — CBC WITH DIFFERENTIAL/PLATELET
BASO%: 0.7 % (ref 0.0–2.0)
Basophils Absolute: 0 10*3/uL (ref 0.0–0.1)
EOS%: 2.1 % (ref 0.0–7.0)
Eosinophils Absolute: 0.1 10*3/uL (ref 0.0–0.5)
HEMATOCRIT: 33.4 % — AB (ref 34.8–46.6)
HEMOGLOBIN: 11 g/dL — AB (ref 11.6–15.9)
LYMPH#: 1 10*3/uL (ref 0.9–3.3)
LYMPH%: 16.8 % (ref 14.0–49.7)
MCH: 26.9 pg (ref 25.1–34.0)
MCHC: 33 g/dL (ref 31.5–36.0)
MCV: 81.5 fL (ref 79.5–101.0)
MONO#: 0.5 10*3/uL (ref 0.1–0.9)
MONO%: 7.7 % (ref 0.0–14.0)
NEUT%: 72.7 % (ref 38.4–76.8)
NEUTROS ABS: 4.4 10*3/uL (ref 1.5–6.5)
Platelets: 306 10*3/uL (ref 145–400)
RBC: 4.1 10*6/uL (ref 3.70–5.45)
RDW: 16.6 % — AB (ref 11.2–14.5)
WBC: 6.1 10*3/uL (ref 3.9–10.3)

## 2017-10-03 LAB — COMPREHENSIVE METABOLIC PANEL
ALBUMIN: 3.7 g/dL (ref 3.5–5.0)
ALK PHOS: 77 U/L (ref 40–150)
ALT: 10 U/L (ref 0–55)
ANION GAP: 9 meq/L (ref 3–11)
AST: 25 U/L (ref 5–34)
BILIRUBIN TOTAL: 0.46 mg/dL (ref 0.20–1.20)
BUN: 11.8 mg/dL (ref 7.0–26.0)
CALCIUM: 9.7 mg/dL (ref 8.4–10.4)
CO2: 27 meq/L (ref 22–29)
Chloride: 100 mEq/L (ref 98–109)
Creatinine: 0.7 mg/dL (ref 0.6–1.1)
Glucose: 80 mg/dl (ref 70–140)
Potassium: 4.1 mEq/L (ref 3.5–5.1)
Sodium: 136 mEq/L (ref 136–145)
TOTAL PROTEIN: 7.6 g/dL (ref 6.4–8.3)

## 2017-10-03 LAB — IRON AND TIBC
%SAT: 10 % — AB (ref 21–57)
IRON: 38 ug/dL — AB (ref 41–142)
TIBC: 391 ug/dL (ref 236–444)
UIBC: 353 ug/dL (ref 120–384)

## 2017-10-03 LAB — FERRITIN: FERRITIN: 18 ng/mL (ref 9–269)

## 2017-10-03 NOTE — Progress Notes (Signed)
Reason for Referral: anemia  HPI: 71 year old woman native of [place] but have lived in this area since the 67s. She has a history of Sjogren's disease as well as myositis. He has been diagnosed with iron deficiency anemia in the past and was evaluated by Dr. Ralene Ok and was prescribed oral iron which she has been taking intermittently since 2013. She reported poor tolerance to oral iron including dyspepsia and constipation. He also had difficulty swallowing these pills. She had a extensive GI workup including colonoscopy the last of which was on 09/26/2017 without any evidence of GI blood losses. Her most recent iron studies in August 2018 showed iron level of 33 and saturation of 9%. Her last ferritin obtained in September 2018 was 16. Her hemoglobin has fluctuated between 10.2 and 10.5 and the last year.  Her GI workup for celiac disease was also negative. She does report symptoms of fatigue and tiredness but has been chronic in nature and related to her autoimmune disease. She denied any hematochezia or melena. She denied any hemoptysis or hematemesis. She still lives independently and drives short distances. She ambulates with the help of a walker mostly related to muscle weakness and a back operation in 2017.  She is not report any headaches, blurry vision, syncope or seizures. She does not report any fevers, chills or sweats. She does not report any cough, wheezing or hemoptysis. She does not report any nausea, vomiting or abdominal pain. She does not report any chest pain, palpitation, orthopnea or leg edema. She does not report any skeletal complaints. She does not report any frequency urgency or hesitancy. Remaining review of systems unremarkable.  Past Medical History:  Diagnosis Date  . AAA (abdominal aortic aneurysm) ([place])    small      report in epic 11/11 states no aaa    . Cataract    surgery on both eyes  . Chronic lower back pain    "for 6 years; relieved since back OR 07/2013"  (09/08/2013)  . Difficult intubation    "ive been told im small", no problems listed with back sx in past  . Diverticulosis   . Family history of anesthesia complication    "sister has severe sleep apnea; she has alot of trouble w/anesthesia" (09/08/2013)  . Gastritis   . GERD (gastroesophageal reflux disease)   . Glaucoma   . Hiatal hernia   . High cholesterol    "not severe; can't take the medicine" (09/08/2013)  . History of hiatal hernia    "small one"  . HTN (hypertension)   . Iron deficiency anemia    hx  . Migraines    "hurt like a migraine; don't get them like I did when I was younger" (09/08/2013)  . Multiple thyroid nodules   . OA (osteoarthritis)   . Osteoporosis   . Pancreatitis   . PHN (postherpetic neuralgia)    right side from shingles  . Polymyositis ([place])   . Raynaud phenomenon   . 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 Shingles   . Sjogren's syndrome ([place])   . Swallowing difficulty    unable to swallow whole pills  . Swelling of left lower extremity   :  Past Surgical History:  Procedure Laterality Date  . ABDOMINAL EXPOSURE N/A 06/02/2016   Procedure: ABDOMINAL EXPOSURE;  Surgeon: Rosetta Posner, MD;  Location: [facility];  Service: Vascular;  Laterality: N/A;  . ANTERIOR LAT LUMBAR FUSION  06/02/2016   Procedure: Thoracic twelve-Lumbar one, Lumbar one-two anterior  Lateral Interbody Fusion;  Surgeon: Erline Levine, MD;  Location: [facility];  Service: Neurosurgery;;  . ANTERIOR LUMBAR FUSION N/A 06/02/2016   Procedure: Lumbar five-sacral one Anterior lumbar interbody fusion with Dr. Donnetta Hutching for approach;  Surgeon: Erline Levine, MD;  Location: [facility];  Service: Neurosurgery;  Laterality: N/A;  . APPLICATION OF INTRAOPERATIVE CT SCAN N/A 06/02/2016   Procedure: APPLICATION OF INTRAOPERATIVE CT SCAN;  Surgeon: Erline Levine, MD;  Location: [facility];  Service: Neurosurgery;  Laterality: N/A;  . CATARACT EXTRACTION W/ INTRAOCULAR LENS  IMPLANT, BILATERAL Bilateral  12/2010-01/2011  . DENTAL SURGERY  11/2011-12/2011   one on each side in my bottom jaw; dental implants" (09/08/2013)  . ESOPHAGEAL DILATION     "a few times before 02/2010; not since" (09/08/2013)  . ESOPHAGEAL MANOMETRY N/A 02/25/2016   Procedure: ESOPHAGEAL MANOMETRY (EM);  Surgeon: Manus Gunning, MD;  Location: [facility];  Service: Gastroenterology;  Laterality: N/A;  . ESOPHAGOGASTRODUODENOSCOPY    . LUMBAR FUSION  Aug 14, 2013   Dr. Consuello Masse  . POSTERIOR LUMBAR FUSION 4 LEVEL Bilateral 06/02/2016   Procedure: Exploration of Fusion Lumbar two-five, Thoracic ten-ileum posterior fusion, Posterolateral arthrodesis with AIRO;  Surgeon: Erline Levine, MD;  Location: [facility];  Service: Neurosurgery;  Laterality: Bilateral;  . REPAIR ZENKER'S DIVERTICULA  02/2010   "diverticulectomy w/zenker's pouch correction" (09/08/2013)  :   Current Outpatient Prescriptions:  .  acetaminophen (TYLENOL) 160 MG/5ML elixir, Take 500 mg by mouth every 4 (four) hours as needed for fever. Reported on 05/16/2016, Disp: , Rfl:  .  aspirin 81 MG chewable tablet, Chew 1 tablet (81 mg total) by mouth daily., Disp: , Rfl:  .  hydroxychloroquine (PLAQUENIL) 200 MG tablet, Take 200 mg by mouth daily., Disp: , Rfl:  .  LOSARTAN POTASSIUM PO, Take 50 mg by mouth daily., Disp: , Rfl:  .  Magnesium 250 MG TABS, Take 250 mg by mouth 2 (two) times daily., Disp: , Rfl:  .  predniSONE (DELTASONE) 5 MG tablet, Take 1 tablet (5 mg total) by mouth daily with breakfast. (Patient taking differently: Take 2 mg by mouth daily with breakfast. ), Disp: 30 tablet, Rfl: 0 .  Tetrahydrozoline HCl (VISINE OP), Place 1 drop into both eyes daily as needed (dry eyes). Reported on 05/16/2016, Disp: , Rfl:  .  timolol (BETIMOL) 0.5 % ophthalmic solution, Place 1 drop into both eyes at bedtime., Disp: , Rfl:  .  traMADol (ULTRAM) 50 MG tablet, Take 1 tablet (50 mg total) by mouth 4 (four) times daily - after meals and at bedtime., Disp: 30  tablet, Rfl: 0 .  Wheat Dextrin (BENEFIBER PO), Take by mouth daily., Disp: , Rfl: :  Allergies  Allergen Reactions  . Phenylephrine     Other reaction(s): Other (See Comments) -- phenylephrine eye drops only  Eye irritation, severe photophobia  . Lisinopril Cough  :  Family History  Problem Relation Age of Onset  . Ovarian cancer Mother   . Hypertension Father   . Heart disease Father   . Breast cancer Father        Age 27  . Colon cancer Father 50  . Psoriasis Sister   . Sarcoidosis Sister   . Arthritis Sister        psoriatic arthritis  . Diabetes Sister   . Arthritis Maternal Uncle        rheumatiod arthritis  . Diabetes Son   . Heart disease Brother   :  Social History   Social History  . Marital status: Widowed    Spouse name: N/A  . Number of children: N/A  . Years of education: N/A   Occupational History  . Not on file.   Social History Main Topics  . Smoking status: Never Smoker  . Smokeless tobacco: Never Used  . Alcohol use No  . Drug use: No  . Sexual activity: No     Comment: 1st intercourse 46 yo-1 partner   Other Topics Concern  . Not on file   Social History Narrative  . No narrative on file  :  Pertinent items are noted in HPI.  Exam: Blood pressure (!) 163/81, pulse 70, temperature (!) 97.5 F (36.4 C), temperature source Oral, resp. rate 16, height 4\' 11"  (1.499 m), weight 122 lb 9.6 oz (55.6 kg), SpO2 99 %.  ECOG 1 General appearance: alert and cooperative appeared without distress. Neck: no adenopathy or masses. Back: negative Resp: clear to auscultation bilaterally Chest wall: no tenderness Cardio: regular rate and rhythm, S1, S2 normal, no murmur, click, rub or gallop Extremities: extremities normal, atraumatic, no cyanosis or edema Skin: Skin color, texture, turgor normal. No rashes or lesions Lymph nodes: Cervical, supraclavicular, and axillary nodes normal.  CBC    Component Value Date/Time   WBC 5.5 06/16/2016 0533    RBC 3.40 (L) 06/16/2016 0533   HGB 9.5 (L) 06/16/2016 0533   HGB 11.9 03/14/2013 1039   HCT 29.9 (L) 06/16/2016 0533   HCT 35.0 03/14/2013 1039   PLT 439 (H) 06/16/2016 0533   PLT 225 03/14/2013 1039   MCV 87.9 06/16/2016 0533   MCV 90.2 03/14/2013 1039   MCH 27.9 06/16/2016 0533   MCHC 31.8 06/16/2016 0533   RDW 15.5 06/16/2016 0533   RDW 12.9 03/14/2013 1039   LYMPHSABS 2.2 06/12/2016 0612   LYMPHSABS 1.2 03/14/2013 1039   MONOABS 0.8 06/12/2016 0612   MONOABS 0.4 03/14/2013 1039   EOSABS 0.2 06/12/2016 0612   EOSABS 0.1 03/14/2013 1039   BASOSABS 0.0 06/12/2016 0612   BASOSABS 0.0 03/14/2013 1039     Chemistry      Component Value Date/Time   NA 131 (L) 06/21/2016 0553   NA 136 03/14/2013 1039   K 3.4 (L) 06/21/2016 0553   K 4.4 03/14/2013 1039   CL 96 (L) 06/21/2016 0553   CL 102 03/14/2013 1039   CO2 29 06/21/2016 0553   CO2 26 03/14/2013 1039   BUN 9 06/21/2016 0553   BUN 10.5 03/14/2013 1039   CREATININE 0.55 06/21/2016 0553   CREATININE 0.6 03/14/2013 1039      Component Value Date/Time   CALCIUM 8.7 (L) 06/21/2016 0553   CALCIUM 9.2 03/14/2013 1039   ALKPHOS 79 06/12/2016 0612   ALKPHOS 92 03/14/2013 1039   AST 23 06/12/2016 0612   AST 39 (H) 03/14/2013 1039   ALT 14 06/12/2016 0612   ALT 28 03/14/2013 1039   BILITOT 0.4 06/12/2016 0612   BILITOT 0.32 03/14/2013 1039       Assessment and Plan:   71 year old woman with the following issues:  1. Iron deficiency anemia: This has been detected dating back to at least 2009. She has been on oral iron intermittently that has been ineffective and at times had poor tolerance. Her iron deficiency appears to be related to absorption issues related to her autoimmune disease. There is no GI blood losses detected after GI workup.  Other causes of anemia were also reviewed today which  include anemia of chronic disease and possibly early myelodysplasia. This considered to be less likely however.  From a  management standpoint, I recommend repeating her iron studies as well as rest of her anemia workup. Risks and benefits of intravenous iron was discussed today if her iron deficiency is persisting. Complications associated with Feraheme include nausea, fatigue, arthralgias and infusion related complications. Rarely anaphylaxis has been noted with this medication. After discussion today, she is agreeable to proceed with intravenous iron infusion after updating her iron studies.  Other causes of anemia will be also evaluated. Growth factor support such as Aranesp could be also used once her iron stores are fully repleted and her hemoglobin continues to be low.  2. Follow-up: Will be in 3 months to repeat her iron studies and to insure adequate stores.

## 2017-10-03 NOTE — Telephone Encounter (Signed)
Gave avs and calendar for October and January 2019 °

## 2017-10-04 LAB — VITAMIN B12: VITAMIN B 12: 335 pg/mL (ref 232–1245)

## 2017-10-04 LAB — ERYTHROPOIETIN: Erythropoietin: 15.2 m[IU]/mL (ref 2.6–18.5)

## 2017-10-08 ENCOUNTER — Ambulatory Visit
Admission: RE | Admit: 2017-10-08 | Discharge: 2017-10-08 | Disposition: A | Discharge status: Home / Self Care | Payer: Medicare HMO | Source: Ambulatory Visit | Attending: Gynecology | Admitting: Gynecology

## 2017-10-08 DIAGNOSIS — Z1231 Encounter for screening mammogram for malignant neoplasm of breast: Secondary | ICD-10-CM

## 2017-10-10 ENCOUNTER — Ambulatory Visit (HOSPITAL_BASED_OUTPATIENT_CLINIC_OR_DEPARTMENT_OTHER): Payer: Medicare HMO

## 2017-10-10 VITALS — BP 134/71 | HR 63 | Temp 98.4°F | Resp 18

## 2017-10-10 DIAGNOSIS — D509 Iron deficiency anemia, unspecified: Secondary | ICD-10-CM | POA: Diagnosis not present

## 2017-10-10 DIAGNOSIS — D508 Other iron deficiency anemias: Secondary | ICD-10-CM

## 2017-10-10 MED ORDER — SODIUM CHLORIDE 0.9 % IV SOLN
Freq: Once | INTRAVENOUS | Status: AC
  Administered 2017-10-10: 09:00:00 via INTRAVENOUS

## 2017-10-10 MED ORDER — SODIUM CHLORIDE 0.9 % IV SOLN
510.0000 mg | Freq: Once | INTRAVENOUS | Status: AC
  Administered 2017-10-10: 510 mg via INTRAVENOUS
  Filled 2017-10-10: qty 17

## 2017-10-10 NOTE — Patient Instructions (Signed)

## 2017-10-17 ENCOUNTER — Ambulatory Visit (HOSPITAL_BASED_OUTPATIENT_CLINIC_OR_DEPARTMENT_OTHER): Payer: Medicare HMO

## 2017-10-17 VITALS — BP 126/75 | HR 58 | Temp 98.1°F | Resp 17

## 2017-10-17 DIAGNOSIS — D509 Iron deficiency anemia, unspecified: Secondary | ICD-10-CM

## 2017-10-17 DIAGNOSIS — D508 Other iron deficiency anemias: Secondary | ICD-10-CM

## 2017-10-17 MED ORDER — SODIUM CHLORIDE 0.9 % IV SOLN
Freq: Once | INTRAVENOUS | Status: AC
  Administered 2017-10-17: 09:00:00 via INTRAVENOUS

## 2017-10-17 MED ORDER — SODIUM CHLORIDE 0.9 % IV SOLN
510.0000 mg | Freq: Once | INTRAVENOUS | Status: AC
  Administered 2017-10-17: 510 mg via INTRAVENOUS
  Filled 2017-10-17: qty 17

## 2017-10-17 NOTE — Patient Instructions (Signed)

## 2017-10-22 DIAGNOSIS — H409 Unspecified glaucoma: Secondary | ICD-10-CM | POA: Diagnosis not present

## 2017-10-22 DIAGNOSIS — M159 Polyosteoarthritis, unspecified: Secondary | ICD-10-CM | POA: Diagnosis not present

## 2017-10-22 DIAGNOSIS — M810 Age-related osteoporosis without current pathological fracture: Secondary | ICD-10-CM | POA: Diagnosis not present

## 2017-10-22 DIAGNOSIS — E7800 Pure hypercholesterolemia, unspecified: Secondary | ICD-10-CM | POA: Diagnosis not present

## 2017-10-22 DIAGNOSIS — I10 Essential (primary) hypertension: Secondary | ICD-10-CM | POA: Diagnosis not present

## 2017-10-25 ENCOUNTER — Ambulatory Visit: Payer: Medicare HMO | Admitting: Gynecology

## 2017-10-25 ENCOUNTER — Encounter: Payer: Self-pay | Admitting: Gynecology

## 2017-10-25 VITALS — BP 122/78 | Ht 59.0 in | Wt 121.0 lb

## 2017-10-25 DIAGNOSIS — N8189 Other female genital prolapse: Secondary | ICD-10-CM | POA: Diagnosis not present

## 2017-10-25 DIAGNOSIS — Z01411 Encounter for gynecological examination (general) (routine) with abnormal findings: Secondary | ICD-10-CM

## 2017-10-25 DIAGNOSIS — Z803 Family history of malignant neoplasm of breast: Secondary | ICD-10-CM | POA: Diagnosis not present

## 2017-10-25 DIAGNOSIS — N952 Postmenopausal atrophic vaginitis: Secondary | ICD-10-CM | POA: Diagnosis not present

## 2017-10-25 DIAGNOSIS — Z4689 Encounter for fitting and adjustment of other specified devices: Secondary | ICD-10-CM

## 2017-10-25 DIAGNOSIS — Z8041 Family history of malignant neoplasm of ovary: Secondary | ICD-10-CM | POA: Diagnosis not present

## 2017-10-25 NOTE — Progress Notes (Signed)
[name] October 03, 1946 [id number]        71 y.o.  [id number] for breast and pelvic exam.  Patient also wants to discuss her family history of breast cancer in her father in his 30s as well as in her 2 paternal aunts who were postmenopausal.  Her mother also developed ovarian cancer in her 10s.  History of pelvic relaxation with uterine prolapse and cystocele using ring pessary with good support.  Past medical history,surgical history, problem list, medications, allergies, family history and social history were all reviewed and documented as reviewed in the EPIC chart.  ROS:  Performed with pertinent positives and negatives included in the history, assessment and plan.   Additional significant findings : None   Exam: [name] assistant Vitals:   10/25/17 1158  BP: 122/78  Weight: 121 lb (54.9 kg)  Height: 4\' 11"  (1.499 m)   Body mass index is 24.44 kg/m.  General appearance:  Normal affect, orientation and appearance. Skin: Grossly normal HEENT: Without gross lesions.  No cervical or supraclavicular adenopathy. Thyroid normal.  Lungs:  Clear without wheezing, rales or rhonchi Cardiac: RR, without RMG Abdominal:  Soft, nontender, without masses, guarding, rebound, organomegaly or hernia Breasts:  Examined lying and sitting without masses, retractions, discharge or axillary adenopathy. Pelvic:  Ext, BUS, Vagina: With atrophic changes.  Ring pessary removed with slight vaginal mucosal irritation but no significant erosion.  Cervix: Flush with upper vagina  Uterus: Anteverted, normal size, shape and contour, midline and mobile nontender   Adnexa: Without masses or tenderness    Anus and perineum: Normal   Rectovaginal: Normal sphincter tone without palpated masses or tenderness.   Ring pessary was removed, cleansed and replaced without difficulty   Assessment/Plan:  71 y.o. [id number] female for breast and pelvic exam.   1. Postmenopausal/atrophic genital changes.  No  significant hot flushes, night sweats, vaginal irritation or bleeding. 2. Pelvic relaxation with cystocele/uterine prolapse.  Doing well with ring pessary.  No significant irritation or erosion.  Pessary was cleansed and replaced. 3. Osteoporosis.  DEXA 2013.  Being followed elsewhere for this.  We will continue to follow-up with her other physicians who are managing this. 4. Family history of breast cancer in her father and 2 paternal aunts.  Mother with ovarian cancer in her 56s.  We have discussed previously the significant history and possibility of genetic linkage with significant increased risk of breast cancer and ovarian cancer for the patient.  Genetic counseling as well as increased surveillance to include MRI and ultrasound screening were reviewed.  Patient has always declined stating she would never do anything different regardless.  I again reviewed the history with her today.  Patient now is interested in ultrasound for ovarian surveillance.  She also would talk to the genetic counselors although is not committed at this point to have blood drawn.  We will go ahead and schedule an ultrasound of the ovaries as well as arrange for the genetic counseling appointment.  Mammography 09/2017.  We will continue with annual mammography.  Declines MRI at this point but will follow up in rediscuss after genetic counseling.  Breast exam normal today. 5. Pap smear 2015.  No Pap smear done today.  No history of significant abnormal Pap smears.  Per current screening guidelines will plan on stop screening based on age. 6. Colonoscopy 2018.  Repeat at their recommended interval. 7. Health maintenance.  No routine lab work done as patient does use elsewhere.  Follow-up in 1  year, sooner as needed.  Additional time in excess of her breast and pelvic exam was spent in direct face to face counseling and coordination of care in regards to her pessary maintenance as well as family history of breast cancer,  surveillance options and testing.    [name] MD, 12:26 PM 10/25/2017

## 2017-10-25 NOTE — Patient Instructions (Signed)
Follow-up for ultrasound as scheduled.  Genetic counseling office should contact you to arrange an appointment.  Call my office if you do not hear from them.

## 2017-11-01 ENCOUNTER — Telehealth: Payer: Self-pay | Admitting: *Deleted

## 2017-11-01 DIAGNOSIS — Z803 Family history of malignant neoplasm of breast: Secondary | ICD-10-CM

## 2017-11-01 NOTE — Telephone Encounter (Signed)
Per note on 10/25/17 "  She also would talk to the genetic counselors although is not committed at this point to have blood drawn.  We will go ahead and schedule an ultrasound of the ovaries as well as arrange for the genetic counseling appointment."  Referral placed [facility] will contact pt to schedule. Pt aware of this as well.

## 2017-11-06 NOTE — Telephone Encounter (Signed)
Appointment scheduled on 01/08/17

## 2017-11-12 DIAGNOSIS — Z961 Presence of intraocular lens: Secondary | ICD-10-CM | POA: Diagnosis not present

## 2017-11-12 DIAGNOSIS — M3501 Sicca syndrome with keratoconjunctivitis: Secondary | ICD-10-CM | POA: Diagnosis not present

## 2017-12-04 DIAGNOSIS — N390 Urinary tract infection, site not specified: Secondary | ICD-10-CM | POA: Diagnosis not present

## 2017-12-05 DIAGNOSIS — M3500 Sicca syndrome, unspecified: Secondary | ICD-10-CM | POA: Diagnosis not present

## 2017-12-05 DIAGNOSIS — M609 Myositis, unspecified: Secondary | ICD-10-CM | POA: Diagnosis not present

## 2017-12-05 DIAGNOSIS — M2550 Pain in unspecified joint: Secondary | ICD-10-CM | POA: Diagnosis not present

## 2017-12-05 DIAGNOSIS — M810 Age-related osteoporosis without current pathological fracture: Secondary | ICD-10-CM | POA: Diagnosis not present

## 2017-12-05 DIAGNOSIS — M549 Dorsalgia, unspecified: Secondary | ICD-10-CM | POA: Diagnosis not present

## 2017-12-05 DIAGNOSIS — M150 Primary generalized (osteo)arthritis: Secondary | ICD-10-CM | POA: Diagnosis not present

## 2017-12-06 ENCOUNTER — Ambulatory Visit: Payer: Medicare HMO | Admitting: Gastroenterology

## 2017-12-07 ENCOUNTER — Ambulatory Visit: Payer: Medicare HMO | Admitting: Neurology

## 2017-12-12 DIAGNOSIS — Z79899 Other long term (current) drug therapy: Secondary | ICD-10-CM | POA: Diagnosis not present

## 2017-12-12 DIAGNOSIS — N390 Urinary tract infection, site not specified: Secondary | ICD-10-CM | POA: Diagnosis not present

## 2017-12-12 DIAGNOSIS — D509 Iron deficiency anemia, unspecified: Secondary | ICD-10-CM | POA: Diagnosis not present

## 2017-12-14 DIAGNOSIS — I10 Essential (primary) hypertension: Secondary | ICD-10-CM | POA: Diagnosis not present

## 2017-12-14 DIAGNOSIS — D509 Iron deficiency anemia, unspecified: Secondary | ICD-10-CM | POA: Diagnosis not present

## 2017-12-14 DIAGNOSIS — N3000 Acute cystitis without hematuria: Secondary | ICD-10-CM | POA: Diagnosis not present

## 2017-12-26 ENCOUNTER — Ambulatory Visit (INDEPENDENT_AMBULATORY_CARE_PROVIDER_SITE_OTHER): Payer: Medicare HMO

## 2017-12-26 ENCOUNTER — Ambulatory Visit: Payer: Medicare HMO | Admitting: Gynecology

## 2017-12-26 ENCOUNTER — Other Ambulatory Visit: Payer: Self-pay | Admitting: Gynecology

## 2017-12-26 DIAGNOSIS — Z8041 Family history of malignant neoplasm of ovary: Secondary | ICD-10-CM

## 2017-12-26 DIAGNOSIS — N8189 Other female genital prolapse: Secondary | ICD-10-CM | POA: Diagnosis not present

## 2017-12-26 DIAGNOSIS — Z803 Family history of malignant neoplasm of breast: Secondary | ICD-10-CM

## 2017-12-26 DIAGNOSIS — D252 Subserosal leiomyoma of uterus: Secondary | ICD-10-CM

## 2017-12-26 DIAGNOSIS — Z4689 Encounter for fitting and adjustment of other specified devices: Secondary | ICD-10-CM | POA: Diagnosis not present

## 2017-12-26 NOTE — Progress Notes (Signed)
    [name] 03-Nov-1946 [id number]        71 y.o.  [id number] presents for ultrasound.  Family history of breast cancer and ovarian cancer.  Ultrasound ordered for pelvic surveillance.  Past medical history,surgical history, problem list, medications, allergies, family history and social history were all reviewed and documented in the EPIC chart.  Directed ROS with pertinent positives and negatives documented in the history of present illness/assessment and plan.  Exam: [place] assistant General appearance:  Normal Abdomen soft nontender without masses guarding rebound Pelvic external BUS vagina with atrophic changes.  Pessary removed.  Cystocele and uterine prolapse noted.  No evidence of irritation or erosion.  Bimanual without masses or tenderness. The patient had her ultrasound and the pessary was replaced without difficulty.  Ultrasound: Transvaginal shows uterus normal size with small myoma 31 mm with calcifications.  Endometrial echo 2.3 mm.  Right and left ovaries normal and atrophic.  Cul-de-sac negative.  Assessment/Plan:  72 y.o. [id number] with ultrasound showing myoma which she knew she had but otherwise normal with ovaries visualized and postmenopausal in appearance.  Endometrial echoes thin.  Patient has appointment with genetic counselor in reference to her strong family history of ovarian and breast cancer end of this month and will follow-up for this.    [name] MD, 11:48 AM 12/26/2017

## 2017-12-26 NOTE — Patient Instructions (Signed)
Follow-up with your appointment with the genetic counselor.

## 2018-01-01 DIAGNOSIS — K1329 Other disturbances of oral epithelium, including tongue: Secondary | ICD-10-CM | POA: Diagnosis not present

## 2018-01-03 ENCOUNTER — Inpatient Hospital Stay: Payer: Medicare HMO

## 2018-01-03 ENCOUNTER — Telehealth: Payer: Self-pay | Admitting: Hematology and Oncology

## 2018-01-03 ENCOUNTER — Inpatient Hospital Stay: Payer: Medicare HMO | Attending: Hematology and Oncology | Admitting: Hematology and Oncology

## 2018-01-03 ENCOUNTER — Other Ambulatory Visit: Payer: Self-pay

## 2018-01-03 VITALS — BP 110/70 | HR 69 | Temp 98.6°F | Resp 18 | Wt 120.9 lb

## 2018-01-03 DIAGNOSIS — Z802 Family history of malignant neoplasm of other respiratory and intrathoracic organs: Secondary | ICD-10-CM | POA: Diagnosis not present

## 2018-01-03 DIAGNOSIS — E538 Deficiency of other specified B group vitamins: Secondary | ICD-10-CM

## 2018-01-03 DIAGNOSIS — Z803 Family history of malignant neoplasm of breast: Secondary | ICD-10-CM | POA: Diagnosis present

## 2018-01-03 DIAGNOSIS — D509 Iron deficiency anemia, unspecified: Secondary | ICD-10-CM

## 2018-01-03 DIAGNOSIS — Z8041 Family history of malignant neoplasm of ovary: Secondary | ICD-10-CM | POA: Insufficient documentation

## 2018-01-03 DIAGNOSIS — D508 Other iron deficiency anemias: Secondary | ICD-10-CM

## 2018-01-03 LAB — CMP (CANCER CENTER ONLY)
ALT: 12 U/L (ref 0–55)
AST: 21 U/L (ref 5–34)
Albumin: 3.7 g/dL (ref 3.5–5.0)
Alkaline Phosphatase: 61 U/L (ref 40–150)
Anion gap: 9 (ref 3–11)
BUN: 16 mg/dL (ref 7–26)
CHLORIDE: 101 mmol/L (ref 98–109)
CO2: 27 mmol/L (ref 22–29)
CREATININE: 0.71 mg/dL (ref 0.60–1.10)
Calcium: 9.7 mg/dL (ref 8.4–10.4)
GFR, Est AFR Am: >60 mL/min (ref >60)
GFR, Estimated: >60 mL/min (ref >60)
Glucose, Bld: 96 mg/dL (ref 70–140)
Potassium: 4.8 mmol/L — ABNORMAL HIGH (ref 3.3–4.7)
SODIUM: 137 mmol/L (ref 136–145)
Total Bilirubin: 0.3 mg/dL (ref 0.2–1.2)
Total Protein: 7.2 g/dL (ref 6.4–8.3)

## 2018-01-03 LAB — CBC WITH DIFFERENTIAL (CANCER CENTER ONLY)
Basophils Absolute: 0 10*3/uL (ref 0.0–0.1)
Basophils Relative: 0 %
EOS ABS: 0.1 10*3/uL (ref 0.0–0.5)
Eosinophils Relative: 1 %
HCT: 39.1 % (ref 34.8–46.6)
HEMOGLOBIN: 12.8 g/dL (ref 11.6–15.9)
LYMPHS ABS: 1.4 10*3/uL (ref 0.9–3.3)
Lymphocytes Relative: 22 %
MCH: 31 pg (ref 25.1–34.0)
MCHC: 32.7 g/dL (ref 31.5–36.0)
MCV: 94.7 fL (ref 79.5–101.0)
MONOS PCT: 7 %
Monocytes Absolute: 0.4 10*3/uL (ref 0.1–0.9)
Neutro Abs: 4.4 10*3/uL (ref 1.5–6.5)
Neutrophils Relative %: 70 %
Platelet Count: 230 10*3/uL (ref 145–400)
RBC: 4.13 MIL/uL (ref 3.70–5.45)
RDW: 16.6 % — ABNORMAL HIGH (ref 11.2–16.1)
WBC Count: 6.3 10*3/uL (ref 3.9–10.3)

## 2018-01-03 LAB — FERRITIN: FERRITIN: 296 ng/mL — AB (ref 9–269)

## 2018-01-03 NOTE — Telephone Encounter (Signed)
Gave avs and calendar for april °

## 2018-01-07 IMAGING — RF DG LUMBAR SPINE COMPLETE 4+V
1 series · 2 of 2 positions shown · non-contrast
Comparison: Intraoperative images compared to preoperative MR
02/17/2016

FLUOROSCOPY TIME:  3 minutes 21 seconds

Images obtained: 4

CLINICAL DATA: Lumbar spine surgery, L1-L2 XL IF, L5-S1 SOLOMON

EXAM:
DG C-ARM 61-120 MIN; LUMBAR SPINE - COMPLETE 4+ VIEW

[Series 1: run · 2 of 2 slices shown]
[im 1/2]
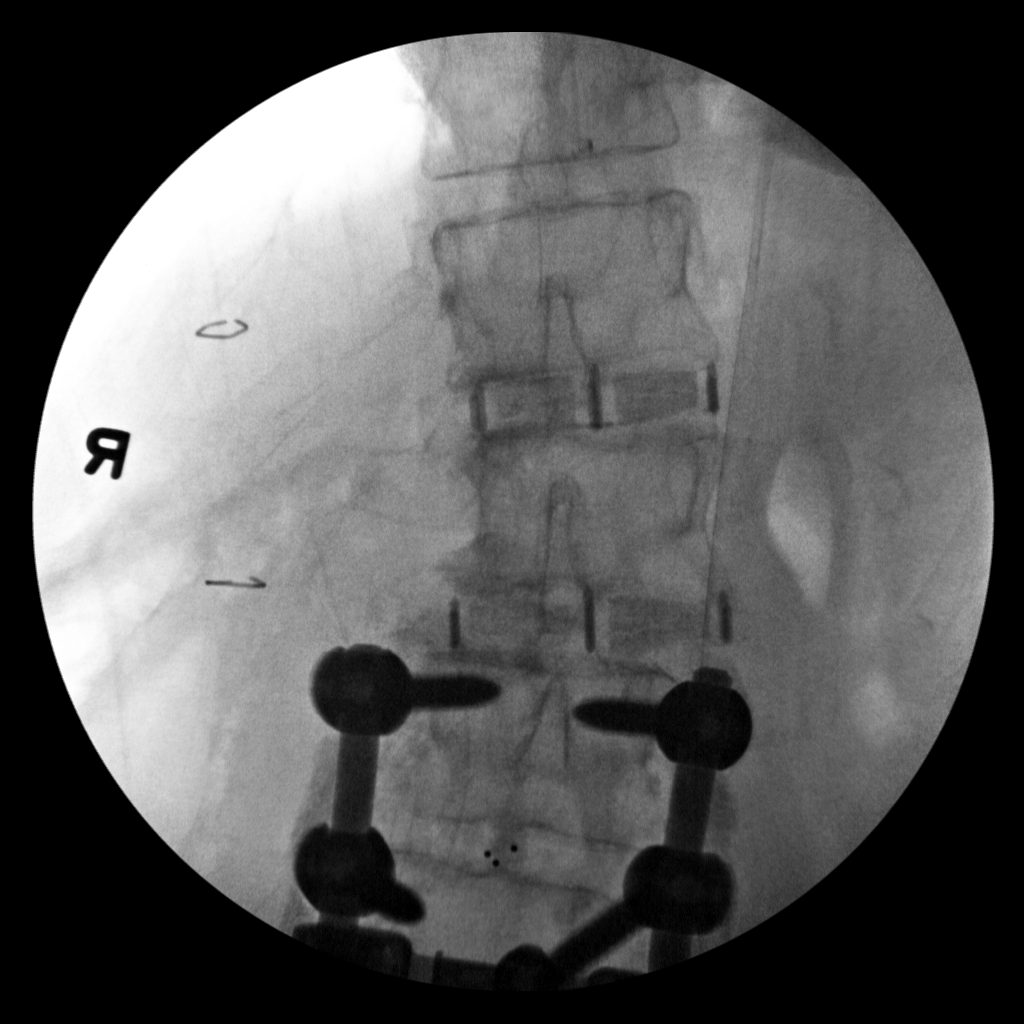
[im 2/2]
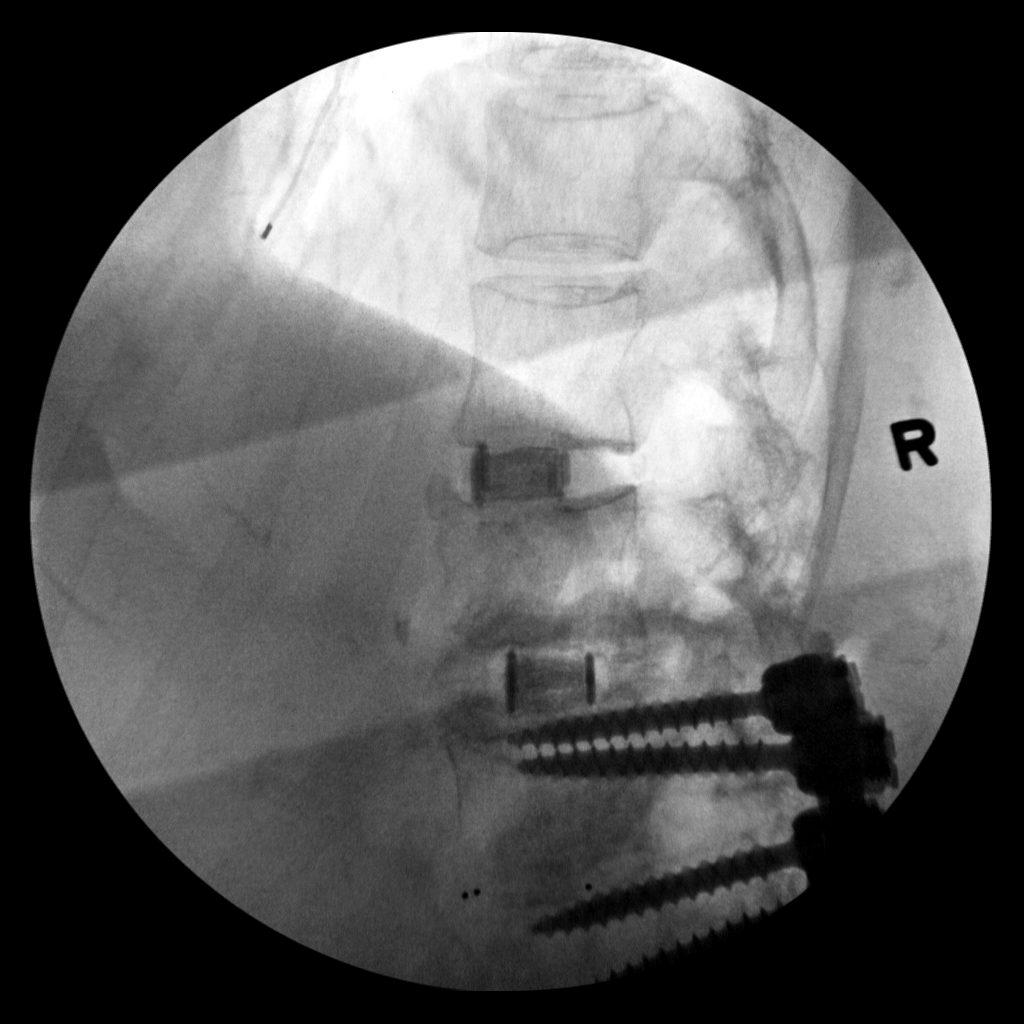

[2 of 2 positions shown; findings below may reference images not displayed]

FINDINGS: Prior MR labeled with 5 lumbar vertebra.

Prior posterior fusion of L2-L5 with BILATERAL pedicle screws and
posterior bars.

Disc prostheses present at T12-L1 and L1-L2.

Interval anterior fusion of L5-S1.

Bones significantly demineralized.

Vertebral body heights maintained.
IMPRESSION: New disc prostheses at T12-L1 and L1-L2 with additional anterior
hardware at L5-S1.

Old posterior fusion L2-L5.

## 2018-01-08 ENCOUNTER — Encounter: Payer: Self-pay | Admitting: Genetics

## 2018-01-08 ENCOUNTER — Inpatient Hospital Stay (HOSPITAL_BASED_OUTPATIENT_CLINIC_OR_DEPARTMENT_OTHER): Payer: Medicare HMO | Admitting: Genetics

## 2018-01-08 ENCOUNTER — Inpatient Hospital Stay: Payer: Medicare HMO

## 2018-01-08 DIAGNOSIS — Z8041 Family history of malignant neoplasm of ovary: Secondary | ICD-10-CM | POA: Diagnosis not present

## 2018-01-08 DIAGNOSIS — Z803 Family history of malignant neoplasm of breast: Secondary | ICD-10-CM | POA: Diagnosis not present

## 2018-01-08 DIAGNOSIS — Z801 Family history of malignant neoplasm of trachea, bronchus and lung: Secondary | ICD-10-CM | POA: Diagnosis not present

## 2018-01-08 NOTE — Progress Notes (Signed)
REFERRING PROVIDER: [name], MD [address] [place], [place] [address]  PRIMARY PROVIDER:  Deland Pretty, MD  PRIMARY REASON FOR VISIT:  1. Family history of breast cancer in female   2. Family history of breast cancer   3. Family history of ovarian cancer   4. Family history of lung cancer    HISTORY OF PRESENT ILLNESS:   [name], a 72 y.o. female, was seen for a [facility] cancer genetics consultation at the request of Dr. Phineas Real due to a family history of cancer.  [name] presents to clinic today to discuss the possibility of a hereditary predisposition to cancer, genetic testing, and to further clarify her future cancer risks, as well as potential cancer risks for family members.   [name] is a 72 y.o. female with no personal history of cancer.    HORMONAL RISK FACTORS:  Menarche was at age 73.  First live birth at age 61.  OCP use for approximately yes, was taken off due to suspected blood clots years.  Ovaries intact: yes.  Hysterectomy: no.  Menopausal status: postmenopausal.  HRT use: 0 years. Colonoscopy: yes; 2018- sessile polyp removed. Reports having annual  Mammograms.   Number of breast biopsies: 0.  Past Medical History:  Diagnosis Date  . AAA (abdominal aortic aneurysm) ([facility])    small      report in epic 11/11 states no aaa    . Cataract    surgery on both eyes  . Chronic lower back pain    "for 6 years; relieved since back OR 07/2013" (09/08/2013)  . Difficult intubation    "ive been told im small", no problems listed with back sx in past  . Diverticulosis   . Family history of anesthesia complication    "sister has severe sleep apnea; she has alot of trouble w/anesthesia" (09/08/2013)  . Family history of breast cancer   . Family history of breast cancer in female   . Family history of lung cancer   . Family history of ovarian cancer   . Gastritis   . GERD (gastroesophageal reflux disease)   . Glaucoma   . Hiatal  hernia   . High cholesterol    "not severe; can't take the medicine" (09/08/2013)  . History of hiatal hernia    "small one"  . HTN (hypertension)   . Iron deficiency anemia    hx  . Migraines    "hurt like a migraine; don't get them like I did when I was younger" (09/08/2013)  . Multiple thyroid nodules   . OA (osteoarthritis)   . Osteoporosis   . Pancreatitis   . PHN (postherpetic neuralgia)    right side from shingles  . Polymyositis ([place])   . Raynaud phenomenon   . 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 Shingles   . Sjogren's syndrome ([facility])   . Swallowing difficulty    unable to swallow whole pills  . Swelling of left lower extremity     Past Surgical History:  Procedure Laterality Date  . ABDOMINAL EXPOSURE N/A 06/02/2016   Procedure: ABDOMINAL EXPOSURE;  Surgeon: Rosetta Posner, MD;  Location: [facility];  Service: Vascular;  Laterality: N/A;  . ANTERIOR LAT LUMBAR FUSION  06/02/2016   Procedure: Thoracic twelve-Lumbar one, Lumbar one-two anterior Lateral Interbody Fusion;  Surgeon: Erline Levine, MD;  Location: [facility];  Service: Neurosurgery;;  . ANTERIOR LUMBAR FUSION N/A 06/02/2016   Procedure: Lumbar five-sacral one Anterior lumbar  interbody fusion with Dr. Donnetta Hutching for approach;  Surgeon: Erline Levine, MD;  Location: [facility];  Service: Neurosurgery;  Laterality: N/A;  . APPLICATION OF INTRAOPERATIVE CT SCAN N/A 06/02/2016   Procedure: APPLICATION OF INTRAOPERATIVE CT SCAN;  Surgeon: Erline Levine, MD;  Location: [facility] NE[facility];  Service: Neurosurgery;  Laterality: N/A;  . CATARACT EXTRACTION W/ INTRAOCULAR LENS  IMPLANT, BILATERAL Bilateral 12/2010-01/2011  . DENTAL SURGERY  11/2011-12/2011   one on each side in my bottom jaw; dental implants" (09/08/2013)  . ESOPHAGEAL DILATION     "a few times before 02/2010; not since" (09/08/2013)  . ESOPHAGEAL MANOMETRY N/A 02/25/2016   Procedure: ESOPHAGEAL MANOMETRY (EM);  Surgeon: Manus Gunning, MD;  Location: [facility];   Service: Gastroenterology;  Laterality: N/A;  . ESOPHAGOGASTRODUODENOSCOPY    . LUMBAR FUSION  Aug 14, 2013   Dr. Consuello Masse  . POSTERIOR LUMBAR FUSION 4 LEVEL Bilateral 06/02/2016   Procedure: Exploration of Fusion Lumbar two-five, Thoracic ten-ileum posterior fusion, Posterolateral arthrodesis with AIRO;  Surgeon: Erline Levine, MD;  Location: [facility] OR[facility];  Service: Neurosurgery;  Laterality: Bilateral;  . REPAIR ZENKER'S DIVERTICULA  02/2010   "diverticulectomy w/zenker's pouch correction" (09/08/2013)    Social History   Socioeconomic History  . Marital status: Widowed    Spouse name: None  . Number of children: None  . Years of education: None  . Highest education level: None  Social Needs  . Financial resource strain: None  . Food insecurity - worry: None  . Food insecurity - inability: None  . Transportation needs - medical: None  . Transportation needs - non-medical: None  Occupational History  . None  Tobacco Use  . Smoking status: Never Smoker  . Smokeless tobacco: Never Used  Substance and Sexual Activity  . Alcohol use: No    Alcohol/week: 0.0 oz  . Drug use: No  . Sexual activity: No    Birth control/protection: Post-menopausal    Comment: 1st intercourse 85 yo-1 partner  Other Topics Concern  . None  Social History Narrative  . None     FAMILY HISTORY:  We obtained a detailed, 4-generation family history.  Significant diagnoses are listed below: Family History  Problem Relation Age of Onset  . Ovarian cancer Mother 70  . Hypertension Father   . Heart disease Father   . Breast cancer Father 66       lumpectomy and tamoxifen  . Colon cancer Father 78  . Psoriasis Sister   . Sarcoidosis Sister   . Arthritis Sister        psoriatic arthritis  . Diabetes Sister   . Arthritis Maternal Uncle        rheumatiod arthritis  . Cancer Maternal Uncle 65       in back- unsure if a bone cancer?  . Diabetes Son   . Heart disease Brother   . Breast cancer Paternal  Aunt 78       lumpect and radiaion  . Breast cancer Paternal Aunt 38       lumpect and radiaion  . Lung cancer Paternal Uncle   . Heart disease Maternal Grandfather        hx smoking, died 'younger'  . Heart disease Paternal Grandfather 56   [name] has 4 sons in their 69's and 89's with no history of cancer.  She has 3 granddaughters and 2 grandsons with no history of cancer.  [name] has 2 brothers in their 53's and 38's with no  history of cancer.  Her brothers both have children, none with any history of cancer.  [name] also has 2 sisters- 1 died at 65 months of age and the other is in her 28's with no history of cancer.  She has 2 sons.    [name] father had colon cancer at 60 and breast cancer diagnosed at 93.  He had a lumpectomy and took tamoxifen.  He died at the age of 72.  [name] has 3 paternal aunts and 2 paternal uncles: -1 paternal uncle died of lung cancer.  -1 paternal uncle is also deceased and had no history of cancer.   -1 paternal aunt had breast cancer in her 57's.  She had a lumpectomy and radiation.  -1 paternal aunt had breast cancer in her 28's and also had lumpectomy and radiation.  -1 paternal aunt with no history of cancer and is still living.  [name] has several paternal cousins who are younger than her (60's50's) with no history of cancer.  [name] paternal grandfather died in his early 16's due to heart disease.  Her paternal grandmother died at 36 with no history of cancer.    [name] mother died at 67 of ovarian cancer dx at 68.  [name] has a maternal uncle who had cancer in his back- perhaps a bone cancer.  He was diagnosed with this cancer in his 57's and died in his 30's.  [name] also has 2 other maternal uncles who are deceased with no history of cancer.  [name] has 2 maternal aunts who died at "older" ages with no history of cancer. [name] has some maternal cousins, none with any history of cancer.  [name]  maternal grandfather died at "younger ages" due to heart disease- he had a history of smoking.  [name] maternal grandmother died in her 42's with no history of cancer.   [name] is unaware of previous family history of genetic testing for hereditary cancer risks. Patient's maternal ancestors are of Northern European descent, and paternal ancestors are of English descent. There is no reported Ashkenazi Jewish ancestry. There is no known consanguinity.  GENETIC COUNSELING ASSESSMENT: GALI SPINNEY is a 72 y.o. female with a family history which is somewhat suggestive of a Hereditary Cancer Predisposition Syndrome. We, therefore, discussed and recommended the following at today's visit.   DISCUSSION: We reviewed the characteristics, features and inheritance patterns of hereditary cancer syndromes. We also discussed genetic testing, including the appropriate family members to test, the process of testing, insurance coverage and turn-around-time for results. We discussed the implications of a negative, positive and/or variant of uncertain significant result. We recommended [name] pursue genetic testing for the Common Hereditary Cancer gene panel.  The Common Hereditary Cancer Panel offered by [facility] includes sequencing and/or deletion duplication testing of the following 47 genes: APC, ATM, AXIN2, BARD1, BMPR1A, BRCA1, BRCA2, BRIP1, CDH1, CDKN2A (p14ARF), CDKN2A (p16INK4a), CKD4, CHEK2, CTNNA1, DICER1, EPCAM (Deletion/duplication testing only), GREM1 (promoter region deletion/duplication testing only), KIT, MEN1, MLH1, MSH2, MSH3, MSH6, MUTYH, NBN, NF1, NHTL1, PALB2, PDGFRA, PMS2, POLD1, POLE, PTEN, RAD50, RAD51C, RAD51D, SDHB, SDHC, SDHD, SMAD4, SMARCA4. STK11, TP53, TSC1, TSC2, and VHL.  The following genes were evaluated for sequence changes only: SDHA and HOXB13 c.251G>A variant only.  We discussed that only 5-10% of cancers are associated with a Hereditary cancer predisposition syndrome.  One  of the most common hereditary cancer syndromes that increases breast cancer risk is called Hereditary  Breast and Ovarian Cancer (HBOC) syndrome.  This syndrome is caused by mutations in the BRCA1 and BRCA2 genes.  This syndrome increases an individual's lifetime risk to develop breast, ovarian, pancreatic, and other types of cancer.  There are also many other cancer predisposition syndromes caused by mutations in several other genes.  We discussed that if she is found to have a mutation in one of these genes, it may impact future medical management recommendations such as increased cancer screenings and consideration of risk reducing surgeries.  A positive result could also have implications for the patient's family members.  A Negative result would mean we were unable to identify a hereditary component to her cancer, but does not rule out the possibility of a hereditary basis for her family's cancer.  There could be mutations that are undetectable by current technology, or in genes not yet tested or identified to increase cancer risk.  There could also be a hereditary cause for the cancer in her family that [name] did not inherit and therefore was not identified in her genetic testing.   We discussed the potential to find a Variant of Uncertain Significance or VUS.  These are variants that have not yet been identified as pathogenic or benign, and it is unknown if this variant is associated with increased cancer risk or if this is a normal finding.  Most VUS's are reclassified to benign or likely benign.   It should not be used to make medical management decisions. With time, we suspect the lab will determine the significance of any VUS's identified if any.   Based on [name]'s family history of cancer, she meets medical criteria for genetic testing. Despite that she meets criteria, she may still have an out of pocket cost. We discussed that if her out of pocket cost for testing is over $100, the  laboratory will call and confirm whether she wants to proceed with testing.  If the out of pocket cost of testing is less than $100 she will be billed by the genetic testing laboratory.  We discussed individuals with medicare insurance typically have $0 OOP cost.   Based on the patient's personal and family history, the statistical model Tyrer cusik.  Was used to estimate her risk of developing breast cancer. This estimates her lifetime risk of developing breast cancer to be approximately 5.7%. This estimation was performed with the assumption of negative genetic testing results- a positive result may alter this risk assessment.  The patient's lifetime breast cancer risk is a preliminary estimate based on available information using one of several models endorsed by the [place] (ACS). The ACS recommends consideration of breast MRI screening as an adjunct to mammography for patients at high risk (defined as 20% or greater lifetime risk). A more detailed breast cancer risk assessment can be considered, if clinically indicated.     We discussed that some people do not want to undergo genetic testing due to fear of genetic discrimination.  A federal law called the Genetic Information Non-Discrimination Act (GINA) of 2008 helps protect individuals against genetic discrimination based on their genetic test results.  It impacts both health insurance and employment.  For health insurance, it protects against increased premiums, being kicked off insurance or being forced to take a test in order to be insured.  For employment it protects against hiring, firing and promoting decisions based on genetic test results.  Health status due to a cancer diagnosis is not protected under GINA.  This  law does not protect life insurance, disability insurance, or other types of insurance.   PLAN: After considering the risks, benefits, and limitations, [name]  provided informed consent to pursue genetic testing  and the blood sample was sent to [facility] for analysis of the Common Hereditary Cancer Panel. Results should be available within approximately 2-3 weeks' time, at which point they will be disclosed by telephone to [name], as will any additional recommendations warranted by these results. [name] will receive a summary of her genetic counseling visit and a copy of her results once available. This information will also be available in Epic. We encouraged [name] to remain in contact with cancer genetics annually so that we can continuously update the family history and inform her of any changes in cancer genetics and testing that may be of benefit for her family. [name] questions were answered to her satisfaction today. Our contact information was provided should additional questions or concerns arise.  Based on Ms. Grimmett's family history, we recommended her siblings and all paternal and maternal relatives, have genetic counseling and testing. Ms. Callins will let us know if we can be of any assistance in coordinating genetic counseling and/or testing for this family member.   Lastly, we encouraged Ms. Gick to remain in contact with cancer genetics annually so that we can continuously update the family history and inform her of any changes in cancer genetics and testing that may be of benefit for this family.   Ms.  Rota questions were answered to her satisfaction today. Our contact information was provided should additional questions or concerns arise. Thank you for the referral and allowing [place] to share in the care of your patient.   Tana Felts, MS Genetic Counselor [email or web address] phone: [phone]  The patient was seen for a total of 40 minutes in face-to-face genetic counseling.  The patient was accompanied today by her son.

## 2018-01-16 ENCOUNTER — Ambulatory Visit: Payer: Medicare HMO | Admitting: Gastroenterology

## 2018-01-18 ENCOUNTER — Telehealth: Payer: Self-pay | Admitting: Genetics

## 2018-01-18 NOTE — Telephone Encounter (Signed)
Revealed negative genetic testing.   This normal result is reassuring and indicates that it is unlikely [name]'s cancer is due to a hereditary cause.  It is unlikely that there is an increased risk of another cancer due to a mutation in one of these genes.  However, genetic testing is not perfect, and cannot definitively rule out a hereditary cause.  It will be important for her to keep in contact with genetics to learn if any additional testing may be needed in the future.    We recommended all of her relatives (except her children unless they have concerning paternal history as well) have genetic testing due to the family history of female breast cancer on her father's side and ovarian cancer on her mother's side.

## 2018-01-21 ENCOUNTER — Encounter: Payer: Self-pay | Admitting: Hematology and Oncology

## 2018-01-21 ENCOUNTER — Encounter: Payer: Self-pay | Admitting: Genetics

## 2018-01-21 ENCOUNTER — Ambulatory Visit: Payer: Self-pay | Admitting: Genetics

## 2018-01-21 DIAGNOSIS — Z801 Family history of malignant neoplasm of trachea, bronchus and lung: Secondary | ICD-10-CM

## 2018-01-21 DIAGNOSIS — K1329 Other disturbances of oral epithelium, including tongue: Secondary | ICD-10-CM | POA: Diagnosis not present

## 2018-01-21 DIAGNOSIS — Z803 Family history of malignant neoplasm of breast: Secondary | ICD-10-CM

## 2018-01-21 DIAGNOSIS — Z8041 Family history of malignant neoplasm of ovary: Secondary | ICD-10-CM

## 2018-01-21 DIAGNOSIS — Z1379 Encounter for other screening for genetic and chromosomal anomalies: Secondary | ICD-10-CM

## 2018-01-21 NOTE — Progress Notes (Signed)
HPI: [name] was previously seen in the [facility] clinic on 01/08/2018 due to a family history of cancer and concerns regarding a hereditary predisposition to cancer. Please refer to our prior cancer genetics clinic note for more information regarding [name]'s medical, social and family histories, and our assessment and recommendations, at the time. [name] recent genetic test results were disclosed to her, as well as recommendations warranted by these results. These results and recommendations are discussed in more detail below.  CANCER HISTORY:   No history exists.     FAMILY HISTORY:  We obtained a detailed, 4-generation family history.  Significant diagnoses are listed below: Family History  Problem Relation Age of Onset  . Ovarian cancer Mother 61  . Hypertension Father   . Heart disease Father   . Breast cancer Father 36       lumpectomy and tamoxifen  . Colon cancer Father 40  . Psoriasis Sister   . Sarcoidosis Sister   . Arthritis Sister        psoriatic arthritis  . Diabetes Sister   . Arthritis Maternal Uncle        rheumatiod arthritis  . Cancer Maternal Uncle 65       in back- unsure if a bone cancer?  . Diabetes Son   . Heart disease Brother   . Breast cancer Paternal Aunt 62       lumpect and radiaion  . Breast cancer Paternal Aunt 16       lumpect and radiaion  . Lung cancer Paternal Uncle   . Heart disease Maternal Grandfather        hx smoking, died 'younger'  . Heart disease Paternal Grandfather 30   [name] has 4 sons in their 72's and 50's with no history of cancer.  She has 3 granddaughters and 2 grandsons with no history of cancer.  [name] has 2 brothers in their 24's and 57's with no history of cancer.  Her brothers both have children, none with any history of cancer.  [name] also has 2 sisters- 1 died at 36 months of age and the other is in her 74's with no history of cancer.  She has 2 sons.    [name] father  had colon cancer at 89 and breast cancer diagnosed at 44.  He had a lumpectomy and took tamoxifen.  He died at the age of 49.  [name] has 3 paternal aunts and 2 paternal uncles: -1 paternal uncle died of lung cancer.  -1 paternal uncle is also deceased and had no history of cancer.   -1 paternal aunt had breast cancer in her 15's.  She had a lumpectomy and radiation.  -1 paternal aunt had breast cancer in her 74's and also had lumpectomy and radiation.  -1 paternal aunt with no history of cancer and is still living.  [name] has several paternal cousins who are younger than her (60's50's) with no history of cancer.  [name] paternal grandfather died in his early 80's due to heart disease.  Her paternal grandmother died at 68 with no history of cancer.    [name] mother died at 6 of ovarian cancer dx at 34.  [name] has a maternal uncle who had cancer in his back- perhaps a bone cancer.  He was diagnosed with this cancer in his 62's and died in his 67's.  [name] also has 2 other maternal uncles who are deceased with  no history of cancer.  [name] has 2 maternal aunts who died at "older" ages with no history of cancer. [name] has some maternal cousins, none with any history of cancer.  [name] maternal grandfather died at "younger ages" due to heart disease- he had a history of smoking.  [name] maternal grandmother died in her 40's with no history of cancer.   [name] is unaware of previous family history of genetic testing for hereditary cancer risks. Patient's maternal ancestors are of Northern European descent, and paternal ancestors are of English descent. There is no reported Ashkenazi Jewish ancestry. There is no known consanguinity. No family history of pancreatitis reported.   GENETIC TEST RESULTS: Genetic testing performed through [facility]'s Common Hereditary Cancer Panel reported out on 01/15/2018 showed no pathogenic mutations. The Common Hereditary  Cancer Panel offered by [facility] includes sequencing and/or deletion duplication testing of the following 47 genes: APC, ATM, AXIN2, BARD1, BMPR1A, BRCA1, BRCA2, BRIP1, CDH1, CDKN2A (p14ARF), CDKN2A (p16INK4a), CKD4, CHEK2, CTNNA1, DICER1, EPCAM (Deletion/duplication testing only), GREM1 (promoter region deletion/duplication testing only), KIT, MEN1, MLH1, MSH2, MSH3, MSH6, MUTYH, NBN, NF1, NHTL1, PALB2, PDGFRA, PMS2, POLD1, POLE, PTEN, RAD50, RAD51C, RAD51D, SDHB, SDHC, SDHD, SMAD4, SMARCA4. STK11, TP53, TSC1, TSC2, and VHL.  The following genes were evaluated for sequence changes only: SDHA and HOXB13 c.251G>A variant only..  The test report will be scanned into EPIC and will be located under the Molecular Pathology section of the Results Review tab.A portion of the result report is included below for reference.     We discussed with [name] that because current genetic testing is not perfect, it is possible there may be a gene mutation in one of these genes that current testing cannot detect, but that chance is small. We also discussed, that there could be another gene that has not yet been discovered, or that we have not yet tested, that is responsible for the cancer diagnoses in the family. It is also possible there is a hereditary cause for the cancer in the family that [name] did not inherit and therefore was not identified in her testing.  Therefore, it is important to remain in touch with cancer genetics in the future so that we can continue to offer [name] the most up to date genetic testing.   ADDITIONAL GENETIC TESTING: We discussed with [name] that there are other genes that are associated with increased cancer risk that can be analyzed. The laboratories that offer this testing look at these additional genes via a hereditary cancer gene panel. Should [name] wish to pursue additional genetic testing, we are happy to discuss and coordinate this testing, at any time.    CANCER  SCREENING RECOMMENDATIONS: [name] test result is considered negative (normal).  This means that we have not identified a hereditary cause for her family history of cancer at this time.   While reassuring, this does not definitively rule out a hereditary predisposition to cancer. It is still possible that there could be genetic mutations that are undetectable by current technology, or genetic mutations in genes that have not been tested or identified to increase cancer risk.  It is also possible there is a hereditary cause for the cancer in her family that [name] did not inherit and therefore was not identified on her test. We recommend [name] follow all cancer screening recommendations provided to her by her physicians. Her personal health history and her family history of cancer still impact  her cancer risk.   Based on the patient's personal and family history, the statistical model Tyrer cusik.  Was used to estimate her risk of developing breast cancer. This estimates her lifetime risk of developing breast cancer to be approximately 5.7%. This estimation was performed with the assumption of negative genetic testing results- a positive result may alter this risk assessment.  The patient's lifetime breast cancer risk is a preliminary estimate based on available information using one of several models endorsed by the [employer] (ACS). The ACS recommends consideration of breast MRI screening as an adjunct to mammography for patients at high risk (defined as 20% or greater lifetime risk). A more detailed breast cancer risk assessment can be considered, if clinically indicated.       RECOMMENDATIONS FOR FAMILY MEMBERS: Individuals in this family might be at some increased risk of developing cancer, over the general population risk, simply due to the family history of cancer. We recommended women in this family have a yearly mammogram beginning at age 52, or 57 years younger than  the earliest onset of cancer, an annual clinical breast exam, and perform monthly breast self-exams. Women in this family should also have a gynecological exam as recommended by their primary provider. All family members should have a colonoscopy by age 61.  All family members should inform their physicians about the family history of cancer so their doctors can make the most appropriate screening recommendations for them.   Based on [name]'s family history, we recommended her siblings and all maternal and paternal relatives, have genetic counseling and testing. [name] will let us know if we can be of any assistance in coordinating genetic counseling and/or testing for *these family members.   FOLLOW-UP: Lastly, we discussed with [name] cancer genetics is a rapidly advancing field and it is possible that new genetic tests will be appropriate for her and/or her family members in the future. We encouraged her to remain in contact with cancer genetics on an annual basis so we can update her personal and family histories and let her know of advances in cancer genetics that may benefit this family.   Our contact number was provided. [name] questions were answered to her satisfaction, and she knows she is welcome to call us at anytime with additional questions or concerns.   Ferol Luz, MS Genetic Counselor Joyelle [name]

## 2018-01-21 NOTE — Progress Notes (Signed)
[facility] Follow-up Visit:  Assessment: Iron deficiency anemia 72 y.o. female with history of iron deficiency anemia treated with parenteral iron due to poor tolerance of oral supplements.  Patient has received 2 treatments with Feraheme and labs obtained today demonstrate resolution of the anemia, normalization of the MCV MCH, and significant improvement of the ferritin value.  Plan: -No need for Feraheme today -Repeat labs including iron panel 1 week prior to return to the clinic -Return to clinic in 3 months with clinic visit and possible Feraheme infusion.  Our goal is to keep ferritin between 50 and 110 in this patient with chronic inflammatory state due to combination of Sjogren's disease and myositis  Voice recognition software was used and creation of this note. Despite my best effort at editing the text, some misspelling/errors may have occurred.  Orders Placed This Encounter  Procedures  . CBC with Differential (Cancer Center Only)    Standing Status:   Future    Standing Expiration Date:   01/03/2019  . CMP ([facility] only)    Standing Status:   Future    Standing Expiration Date:   01/03/2019  . Iron and TIBC    Standing Status:   Future    Standing Expiration Date:   01/03/2019  . Ferritin    Standing Status:   Future    Standing Expiration Date:   01/03/2019  . Vitamin B12    Standing Status:   Future    Standing Expiration Date:   01/03/2019  . Methylmalonic acid, serum    Standing Status:   Future    Standing Expiration Date:   01/03/2019    Cancer Staging No matching staging information was found for the patient.  All questions were answered.  . The patient knows to call the clinic with any problems, questions or concerns.  This note was electronically signed.    History of Presenting Illness [name] is a 72 y.o. female followed in the [facility] for diagnosis of iron deficiency anemia.  Her past medical history is  significant for Sjogren's disease as well as myositis. She has been diagnosed with iron deficiency anemia in the past and was evaluated by Dr. Ralene Ok and was prescribed oral iron which she has been taking intermittently since 2013. She reported poor tolerance to oral iron including dyspepsia and constipation. He also had difficulty swallowing these pills. She had a extensive GI workup including colonoscopy the last of which was on 09/26/17 without any evidence of GI blood losses. Her GI workup for celiac disease was also negative. Abbreviated review of hematological lab work is available below.  Patient was seen by Dr. Alen Blew in October 2018 and received 2 doses of Feraheme for treatment.  On initial presentation to hematology, she didreport symptoms of fatigue and tiredness but those have been chronic in nature and related to her autoimmune disease. She denied any hematochezia or melena. She denied any hemoptysis or hematemesis. She still lives independently and drives short distances. She ambulates with the help of a walker mostly related to muscle weakness and a back operation in 2017. She is not report any headaches, blurry vision, syncope or seizures. She does not report any fevers, chills or sweats. She does not report any cough, wheezing or hemoptysis. She does not report any nausea, vomiting or abdominal pain. She did not report any chest pain, palpitation, orthopnea or leg edema. She did not report any skeletal complaints. She did not report any frequency  urgency or hesitancy.   Oncological/hematological History: --Labs, Aug 2018:            Fe 33, FeSat 9% --Labs, Sep 2018:                 Ferritin   16; --Labs, 10/03/17: Hgb 11.0, MCV 81.5, MCH 26.9, MCHC 33.0, RDW 16.6, Plt 306; Fe 38, FeSat 10%, TIBC 391, Ferritin   18, Vit B12 335; Epo 15.2 --Treatment:  --Feraheme 555m IV x2, Oct-Nov 2018 --Labs, 01/03/18: Hgb 12.8, MCV 94.7, MCH 31.0, MCHC 32.7, RDW 16.6, Plt 230;        Ferritin  296   Medical History: Past Medical History:  Diagnosis Date  . AAA (abdominal aortic aneurysm) ([place]    small      report in epic 11/11 states no aaa    . Cataract    surgery on both eyes  . Chronic lower back pain    "for 6 years; relieved since back OR 07/2013" (09/08/2013)  . Difficult intubation    "ive been told im small", no problems listed with back sx in past  . Diverticulosis   . Family history of anesthesia complication    "sister has severe sleep apnea; she has alot of trouble w/anesthesia" (09/08/2013)  . Family history of breast cancer   . Family history of breast cancer in female   . Family history of lung cancer   . Family history of ovarian cancer   . Gastritis   . GERD (gastroesophageal reflux disease)   . Glaucoma   . Hiatal hernia   . High cholesterol    "not severe; can't take the medicine" (09/08/2013)  . History of hiatal hernia    "small one"  . HTN (hypertension)   . Iron deficiency anemia    hx  . Migraines    "hurt like a migraine; don't get them like I did when I was younger" (09/08/2013)  . Multiple thyroid nodules   . OA (osteoarthritis)   . Osteoporosis   . Pancreatitis   . PHN (postherpetic neuralgia)    right side from shingles  . 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 Raynaud phenomenon   . 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 Shingles   . [place]ren's syndrome ([place]   . Swallowing difficulty    unable to swallow whole pills  . Swelling of left lower extremity     Surgical History: Past Surgical History:  Procedure Laterality Date  . ABDOMINAL EXPOSURE N/A 06/02/2016   Procedure: ABDOMINAL EXPOSURE;  Surgeon: TRosetta Posner MD;  Location: [facility];  Service: Vascular;  Laterality: N/A;  . ANTERIOR LAT LUMBAR FUSION  06/02/2016   Procedure: Thoracic twelve-Lumbar one, Lumbar one-two anterior Lateral Interbody Fusion;  Surgeon: JErline Levine MD;  [facility];  Service: Neurosurgery;;  . ANTERIOR LUMBAR FUSION N/A 06/02/2016   Procedure: Lumbar  five-sacral one Anterior lumbar interbody fusion with Dr. EDonnetta Hutchingfor approach;  Surgeon: JErline Levine MD;  [facility];  Service: Neurosurgery;  Laterality: N/A;  . APPLICATION OF INTRAOPERATIVE CT SCAN N/A 06/02/2016   Procedure: APPLICATION OF INTRAOPERATIVE CT SCAN;  Surgeon: JErline Levine MD;  [facility];  Service: Neurosurgery;  Laterality: N/A;  . CATARACT EXTRACTION W/ INTRAOCULAR LENS  IMPLANT, BILATERAL Bilateral 12/2010-01/2011  . DENTAL SURGERY  11/2011-12/2011   one on each side in my bottom jaw; dental implants" (09/08/2013)  . ESOPHAGEAL DILATION     "a few times before 02/2010; not since" (  09/08/2013)  . ESOPHAGEAL MANOMETRY N/A 02/25/2016   Procedure: ESOPHAGEAL MANOMETRY (EM);  Surgeon: Manus Gunning, MD;  Location: [facility];  Service: Gastroenterology;  Laterality: N/A;  . ESOPHAGOGASTRODUODENOSCOPY    . LUMBAR FUSION  Aug 14, 2013   Dr. Consuello Masse  . POSTERIOR LUMBAR FUSION 4 LEVEL Bilateral 06/02/2016   Procedure: Exploration of Fusion Lumbar two-five, Thoracic ten-ileum posterior fusion, Posterolateral arthrodesis with AIRO;  Surgeon: Erline Levine, MD;  Location: [facility];  Service: Neurosurgery;  Laterality: Bilateral;  . REPAIR ZENKER'S DIVERTICULA  02/2010   "diverticulectomy w/zenker's pouch correction" (09/08/2013)    Family History: Family History  Problem Relation Age of Onset  . Ovarian cancer Mother 53  . Hypertension Father   . Heart disease Father   . Breast cancer Father 17       lumpectomy and tamoxifen  . Colon cancer Father 69  . Psoriasis Sister   . Sarcoidosis Sister   . Arthritis Sister        psoriatic arthritis  . Diabetes Sister   . Arthritis Maternal Uncle        rheumatiod arthritis  . Cancer Maternal Uncle 65       in back- unsure if a bone cancer?  . Diabetes Son   . Heart disease Brother   . Breast cancer Paternal Aunt 37       lumpect and radiaion  . Breast cancer Paternal Aunt 70       lumpect and  radiaion  . Lung cancer Paternal Uncle   . Heart disease Maternal Grandfather        hx smoking, died 'younger'  . Heart disease Paternal Grandfather 65    Social History: Social History   Socioeconomic History  . Marital status: Widowed    Spouse name: Not on file  . Number of children: Not on file  . Years of education: Not on file  . Highest education level: Not on file  Social Needs  . Financial resource strain: Not on file  . Food insecurity - worry: Not on file  . Food insecurity - inability: Not on file  . Transportation needs - medical: Not on file  . Transportation needs - non-medical: Not on file  Occupational History  . Not on file  Tobacco Use  . Smoking status: Never Smoker  . Smokeless tobacco: Never Used  Substance and Sexual Activity  . Alcohol use: No    Alcohol/week: 0.0 oz  . Drug use: No  . Sexual activity: No    Birth control/protection: Post-menopausal    Comment: 1st intercourse 27 yo-1 partner  Other Topics Concern  . Not on file  Social History Narrative  . Not on file    Allergies: Allergies  Allergen Reactions  . Macrodantin [Nitrofurantoin Macrocrystal]     Liquid form--Can't tolerate  . Phenylephrine     Other reaction(s): Other (See Comments) -- phenylephrine eye drops only  Eye irritation, severe photophobia  . Lisinopril Cough    Medications:  Current Outpatient Medications  Medication Sig Dispense Refill  . acetaminophen (TYLENOL) 160 MG/5ML elixir Take 500 mg by mouth every 4 (four) hours as needed for fever. Reported on 05/16/2016    . aspirin 81 MG chewable tablet Chew 1 tablet (81 mg total) by mouth daily.    . hydroxychloroquine (PLAQUENIL) 200 MG tablet Take 200 mg by mouth daily.    Marland Kitchen LOSARTAN POTASSIUM PO Take 50 mg by mouth daily.    . Magnesium 250 MG  TABS Take 250 mg by mouth 2 (two) times daily.    . predniSONE (DELTASONE) 5 MG tablet Take 1 tablet (5 mg total) by mouth daily with breakfast. (Patient taking  differently: Take 2 mg by mouth daily with breakfast. ) 30 tablet 0  . Tetrahydrozoline HCl (VISINE OP) Place 1 drop into both eyes daily as needed (dry eyes). Reported on 05/16/2016    . timolol (BETIMOL) 0.5 % ophthalmic solution Place 1 drop into both eyes at bedtime.    . traMADol (ULTRAM) 50 MG tablet Take 1 tablet (50 mg total) by mouth 4 (four) times daily - after meals and at bedtime. 30 tablet 0  . Wheat Dextrin (BENEFIBER PO) Take by mouth daily.     No current facility-administered medications for this visit.     Review of Systems: Review of Systems  All other systems reviewed and are negative.    PHYSICAL EXAMINATION Blood pressure 110/70, pulse 69, temperature 98.6 F (37 C), temperature source Oral, resp. rate 18, weight 120 lb 14.4 oz (54.8 kg), SpO2 99 %.  ECOG PERFORMANCE STATUS: 1 - Symptomatic but completely ambulatory  Physical Exam  Constitutional: She is oriented to person, place, and time and well-developed, well-nourished, and in no distress. No distress.  HENT:  Head: Normocephalic and atraumatic.  Mouth/Throat: Oropharynx is clear and moist. No oropharyngeal exudate.  Eyes: Conjunctivae and EOM are normal. Pupils are equal, round, and reactive to light. No scleral icterus.  Neck: No thyromegaly present.  Cardiovascular: Normal rate, regular rhythm and normal heart sounds.  No murmur heard. Pulmonary/Chest: Effort normal and breath sounds normal. No respiratory distress. She has no wheezes. She has no rales.  Abdominal: Soft. Bowel sounds are normal. She exhibits no distension and no mass. There is no tenderness. There is no guarding.  Musculoskeletal: Normal range of motion. She exhibits no edema.  Lymphadenopathy:    She has no cervical adenopathy.  Neurological: She is alert and oriented to person, place, and time. She has normal reflexes. No cranial nerve deficit.  Skin: Skin is warm and dry. No rash noted. She is not diaphoretic. No erythema. No  pallor.     LABORATORY DATA: I have personally reviewed the data as listed: Appointment on 01/03/2018  Component Date Value Ref Range Status  . Ferritin 01/03/2018 296* 9 - 269 ng/mL Final   Performed at [facility], [place] [address], [place],  [address]  . Sodium 01/03/2018 137  136 - 145 mmol/L Final  . Potassium 01/03/2018 4.8* 3.3 - 4.7 mmol/L Final  . Chloride 01/03/2018 101  98 - 109 mmol/L Final  . CO2 01/03/2018 27  22 - 29 mmol/L Final  . Glucose, Bld 01/03/2018 96  70 - 140 mg/dL Final  . BUN 01/03/2018 16  7 - 26 mg/dL Final  . Creatinine 01/03/2018 0.71  0.60 - 1.10 mg/dL Final  . Calcium 01/03/2018 9.7  8.4 - 10.4 mg/dL Final  . Total Protein 01/03/2018 7.2  6.4 - 8.3 g/dL Final  . Albumin 01/03/2018 3.7  3.5 - 5.0 g/dL Final  . AST 01/03/2018 21  5 - 34 U/L Final  . ALT 01/03/2018 12  0 - 55 U/L Final  . Alkaline Phosphatase 01/03/2018 61  40 - 150 U/L Final  . Total Bilirubin 01/03/2018 0.3  0.2 - 1.2 mg/dL Final  . GFR, Est Non Af Am 01/03/2018 >60  >60 mL/min Final  . GFR, Est AFR Am 01/03/2018 >60  >60 mL/min  Final   Comment: (NOTE) The eGFR has been calculated using the CKD EPI equation. This calculation has not been validated in all clinical situations. eGFR's persistently <60 mL/min signify possible Chronic Kidney Disease.   Georgiann Hahn gap 01/03/2018 9  3 - 11 Final   Performed at [facility], [place] [address] [place], [place] [address]  . WBC Count 01/03/2018 6.3  3.9 - 10.3 K/uL Final  . RBC 01/03/2018 4.13  3.70 - 5.45 MIL/uL Final  . Hemoglobin 01/03/2018 12.8  11.6 - 15.9 g/dL Final  . HCT 01/03/2018 39.1  34.8 - 46.6 % Final  . MCV 01/03/2018 94.7  79.5 - 101.0 fL Final  . MCH 01/03/2018 31.0  25.1 - 34.0 pg Final  . MCHC 01/03/2018 32.7  31.5 - 36.0 g/dL Final  . RDW 01/03/2018 16.6* 11.2 - 16.1 % Final  . Platelet Count 01/03/2018 230  145 - 400 K/uL Final  . Neutrophils Relative % 01/03/2018  70  % Final  . Neutro Abs 01/03/2018 4.4  1.5 - 6.5 K/uL Final  . Lymphocytes Relative 01/03/2018 22  % Final  . Lymphs Abs 01/03/2018 1.4  0.9 - 3.3 K/uL Final  . Monocytes Relative 01/03/2018 7  % Final  . Monocytes Absolute 01/03/2018 0.4  0.1 - 0.9 K/uL Final  . Eosinophils Relative 01/03/2018 1  % Final  . Eosinophils Absolute 01/03/2018 0.1  0.0 - 0.5 K/uL Final  . Basophils Relative 01/03/2018 0  % Final  . Basophils Absolute 01/03/2018 0.0  0.0 - 0.1 K/uL Final   Performed at [facility], [place] [address] [place], [place] [address]       Ardath Sax, MD

## 2018-01-21 NOTE — Assessment & Plan Note (Signed)
72 y.o. female with history of iron deficiency anemia treated with parenteral iron due to poor tolerance of oral supplements.  Patient has received 2 treatments with Feraheme and labs obtained today demonstrate resolution of the anemia, normalization of the MCV MCH, and significant improvement of the ferritin value.  Plan: -No need for Feraheme today -Repeat labs including iron panel 1 week prior to return to the clinic -Return to clinic in 3 months with clinic visit and possible Feraheme infusion.  Our goal is to keep ferritin between 50 and 110 in this patient with chronic inflammatory state due to combination of Sjogren's disease and myositis

## 2018-01-25 DIAGNOSIS — K1329 Other disturbances of oral epithelium, including tongue: Secondary | ICD-10-CM | POA: Diagnosis not present

## 2018-02-14 DIAGNOSIS — H401112 Primary open-angle glaucoma, right eye, moderate stage: Secondary | ICD-10-CM | POA: Diagnosis not present

## 2018-02-14 DIAGNOSIS — H401122 Primary open-angle glaucoma, left eye, moderate stage: Secondary | ICD-10-CM | POA: Diagnosis not present

## 2018-03-12 DIAGNOSIS — D509 Iron deficiency anemia, unspecified: Secondary | ICD-10-CM | POA: Diagnosis not present

## 2018-03-12 DIAGNOSIS — H409 Unspecified glaucoma: Secondary | ICD-10-CM | POA: Diagnosis not present

## 2018-03-12 DIAGNOSIS — M159 Polyosteoarthritis, unspecified: Secondary | ICD-10-CM | POA: Diagnosis not present

## 2018-03-12 DIAGNOSIS — E7800 Pure hypercholesterolemia, unspecified: Secondary | ICD-10-CM | POA: Diagnosis not present

## 2018-03-12 DIAGNOSIS — M810 Age-related osteoporosis without current pathological fracture: Secondary | ICD-10-CM | POA: Diagnosis not present

## 2018-03-12 DIAGNOSIS — I10 Essential (primary) hypertension: Secondary | ICD-10-CM | POA: Diagnosis not present

## 2018-03-18 ENCOUNTER — Ambulatory Visit: Payer: Medicare HMO | Admitting: Neurology

## 2018-03-18 ENCOUNTER — Encounter: Payer: Self-pay | Admitting: Neurology

## 2018-03-18 VITALS — BP 140/84 | HR 67 | Ht 59.0 in | Wt 128.0 lb

## 2018-03-18 DIAGNOSIS — M3506 Sjogren syndrome with peripheral nervous system involvement: Secondary | ICD-10-CM

## 2018-03-18 DIAGNOSIS — M21372 Foot drop, left foot: Secondary | ICD-10-CM

## 2018-03-18 DIAGNOSIS — G63 Polyneuropathy in diseases classified elsewhere: Secondary | ICD-10-CM | POA: Insufficient documentation

## 2018-03-18 DIAGNOSIS — M3509 Sicca syndrome with other organ involvement: Secondary | ICD-10-CM

## 2018-03-18 DIAGNOSIS — G7241 Inclusion body myositis [IBM]: Secondary | ICD-10-CM | POA: Insufficient documentation

## 2018-03-18 MED ORDER — AMBULATORY NON FORMULARY MEDICATION: 1.0000 [IU] | Freq: Every day | 0 refills | Status: DC

## 2018-03-18 NOTE — Patient Instructions (Addendum)
Referral to orthotics for left AFO  Return to clinic as needed

## 2018-03-18 NOTE — Progress Notes (Signed)
[facility] Clinic Note - Initial Visit   Date: 03/18/18  [name] MRN: [id number] DOB: 10/23/1946   Dear Dr. Vertell Limber:  Thank you for your kind referral of [name] for consultation of inclusion body myositis. Although her history is well known to you, please allow [place] to reiterate it for the purpose of our medical record. The patient was accompanied to the clinic by son who also provides collateral information.     History of Present Illness: [name] is a 72 y.o. right-handed Caucasian female with Sjogren's syndrome, hypertension, glaucoma, iron deficiency anemia referred for second opinion for inclusion body myositis.     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  She initially presented with stepping up on curbs and climbing stairs.  One year prior to this, she was diagnosed with Sjogren's disease and managed on plaquenil.  She was on prednisone for about 1 year for polymyositis, but did not have any improvement and tapered off.  Around 2008, she started having problems with swallowing and needed esophageal stretching, which improved swallowing for 3 months.  Over the last 5-7 years, she has developed gradual weakness of the hands, especially with grasping and finger flexion.  She initially attributed symptoms to arthritis.  Since this time, she has continues to have very gradual decline in muscle weakness of the legs, hands, and swallowing.  She has been using a rollator for 10+ years.    She is followed by Dr. Jenetta Downer at [facility] for inclusion body myositis and diagnosed in 2018.  She had extensive back surgery by Dr. Vertell Limber in 2017 for scoliosis and spinal stenosis/radiculopathy and did not recover well, so was started on prednisone 10mg  and has been on a tapering dose of this.  She is currently down to prednisone 6mg  daily.  Dr.  Jenetta Downer has suggested using azathioprine which she has not agreed to yet.  She was briefly on methrotrexate but did not tolerate this due to mouth sores within 3 weeks.   She denies any vision problems, difficulty with speech, or tingling.  She occasionally has sporadic shooting pain in the toes and right little finger.   She lives alone in a one-level home, there are no steps to enter her home.  She has four grown sons who come and visit her often.  She enjoys playing the piano.    Out-side paper records, electronic medical record, and images have been reviewed where available and summarized as:  MRI lumbar spine wwo contrast 02/17/2016: Postoperative changes with posterior and interbody fusion from L2-L5. Wide decompressive laminectomies without spinal or foraminal stenosis. Postoperative fluid collection in the laminectomy bed from L2-3-L4-5 Degenerative disc disease at T12-L1, L1-2 and L5-S1 as described above. No significant spinal or foraminal stenosis. Mild bilateral lateral recess stenosis and mild bilateral foraminal encroachment at L1-2.  MRI thoracic spine wo 12/31/2015: Mild spondylosis of the thoracic spine without central canal or foraminal narrowing. Convex left thoracolumbar scoliosis is incompletely imaged.  Lab Results  Component Value Date   CKTOTAL 350 (H) 06/02/2016   TROPONINI 0.53 (HH) 06/05/2016    Lab Results  Component Value Date   VITAMINB12 335 10/03/2017     Past Medical History:  Diagnosis Date  . AAA (abdominal aortic aneurysm) ([place])    small      report in epic 11/11 states no aaa    .  Cataract    surgery on both eyes  . Chronic lower back pain    "for 6 years; relieved since back OR 07/2013" (09/08/2013)  . Difficult intubation    "ive been told im small", no problems listed with back sx in past  . Diverticulosis   . Family history of anesthesia complication    "sister has severe sleep apnea; she has alot of trouble w/anesthesia" (09/08/2013)  . Family  history of breast cancer   . Family history of breast cancer in female   . Family history of lung cancer   . Family history of ovarian cancer   . Gastritis   . GERD (gastroesophageal reflux disease)   . Glaucoma   . Hiatal hernia   . High cholesterol    "not severe; can't take the medicine" (09/08/2013)  . History of hiatal hernia    "small one"  . HTN (hypertension)   . Iron deficiency anemia    hx  . Migraines    "hurt like a migraine; don't get them like I did when I was younger" (09/08/2013)  . Multiple thyroid nodules   . OA (osteoarthritis)   . Osteoporosis   . Pancreatitis   . PHN (postherpetic neuralgia)    right side from shingles  . Polymyositis ([place])   . Raynaud phenomenon   . 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 Shingles   . Sjogren's syndrome ([place])   . Swallowing difficulty    unable to swallow whole pills  . Swelling of left lower extremity     Past Surgical History:  Procedure Laterality Date  . ABDOMINAL EXPOSURE N/A 06/02/2016   Procedure: ABDOMINAL EXPOSURE;  Surgeon: Rosetta Posner, MD;  Location: [facility]URO ORS;  Service: Vascular;  Laterality: N/A;  . ANTERIOR LAT LUMBAR FUSION  06/02/2016   Procedure: Thoracic twelve-Lumbar one, Lumbar one-two anterior Lateral Interbody Fusion;  Surgeon: Erline Levine, MD;  Location: [place] NEURO ORS;  Service: Neurosurgery;;  . ANTERIOR LUMBAR FUSION N/A 06/02/2016   Procedure: Lumbar five-sacral one Anterior lumbar interbody fusion with Dr. Donnetta Hutching for approach;  Surgeon: Erline Levine, MD;  Location: [place] NEUR[facility] ORS;  Service: Neurosurgery;  Laterality: N/A;  . APPLICATION OF INTRAOPERATIVE CT SCAN N/A 06/02/2016   Procedure: APPLICATION OF INTRAOPERATIVE CT SCAN;  Surgeon: Erline Levine, MD;  Location: [place] NEURO ORS;  Service: Neurosurgery;  Laterality: N/A;  . CATARACT EXTRACTION W/ INTRAOCULAR LENS  IMPLANT, BILATERAL Bilateral 12/2010-01/2011  . DENTAL SURGERY  11/2011-12/2011   one on each side in my bottom jaw; dental implants" (09/08/2013)    . ESOPHAGEAL DILATION     "a few times before 02/2010; not since" (09/08/2013)  . ESOPHAGEAL MANOMETRY N/A 02/25/2016   Procedure: ESOPHAGEAL MANOMETRY (EM);  Surgeon: Manus Gunning, MD;  Location: [facility];  Service: Gastroenterology;  Laterality: N/A;  . ESOPHAGOGASTRODUODENOSCOPY    . LUMBAR FUSION  Aug 14, 2013   Dr. Consuello Masse  . POSTERIOR LUMBAR FUSION 4 LEVEL Bilateral 06/02/2016   Procedure: Exploration of Fusion Lumbar two-five, Thoracic ten-ileum posterior fusion, Posterolateral arthrodesis with AIRO;  Surgeon: Erline Levine, MD;  Location: [place] NEURO ORS;  Service: Neurosurgery;  Laterality: Bilateral;  . REPAIR ZENKER'S DIVERTICULA  02/2010   "diverticulectomy w/zenker's pouch correction" (09/08/2013)     Medications:  Outpatient Encounter Medications as of 03/18/2018  Medication Sig  . acetaminophen (TYLENOL) 500 MG tablet Take 500 mg by mouth every 6 (six) hours as needed.  . Ascorbic Acid (VITAMIN C PO) Take 125 mg by mouth.  Marland Kitchen  aspirin 81 MG chewable tablet Chew 1 tablet (81 mg total) by mouth daily.  . hydroxychloroquine (PLAQUENIL) 200 MG tablet Take 200 mg by mouth daily.  Marland Kitchen LOSARTAN POTASSIUM PO Take 50 mg by mouth daily.  . Magnesium 250 MG TABS Take 250 mg by mouth 2 (two) times daily.  . Magnesium Oxide 250 MG TABS Take by mouth.  . Multiple Vitamin (MULTIVITAMIN) tablet Take 1 tablet by mouth daily.  . predniSONE (DELTASONE) 1 MG tablet   . predniSONE (DELTASONE) 5 MG tablet Take 1 tablet (5 mg total) by mouth daily with breakfast. (Patient taking differently: Take 2 mg by mouth daily with breakfast. )  . Tetrahydrozoline HCl (VISINE OP) Place 1 drop into both eyes daily as needed (dry eyes). Reported on 05/16/2016  . timolol (BETIMOL) 0.5 % ophthalmic solution Place 1 drop into both eyes at bedtime.  . traMADol (ULTRAM) 50 MG tablet Take 1 tablet (50 mg total) by mouth 4 (four) times daily - after meals and at bedtime.  . Wheat Dextrin (BENEFIBER PO) Take by mouth  daily.  . AMBULATORY NON FORMULARY MEDICATION 1 Units by Other route daily. Left AFO  . [DISCONTINUED] acetaminophen (TYLENOL) 160 MG/5ML elixir Take 500 mg by mouth every 4 (four) hours as needed for fever. Reported on 05/16/2016   No facility-administered encounter medications on file as of 03/18/2018.      Allergies:  Allergies  Allergen Reactions  . Macrodantin [Nitrofurantoin Macrocrystal]     Liquid form--Can't tolerate  . Phenylephrine     Other reaction(s): Other (See Comments) -- phenylephrine eye drops only  Eye irritation, severe photophobia  . Lisinopril Cough    Family History: Family History  Problem Relation Age of Onset  . Ovarian cancer Mother 59  . Hypertension Father   . Heart disease Father   . Breast cancer Father 16       lumpectomy and tamoxifen  . Colon cancer Father 15  . Psoriasis Sister   . Sarcoidosis Sister   . Arthritis Sister        psoriatic arthritis  . Diabetes Sister   . Arthritis Maternal Uncle        rheumatiod arthritis  . Cancer Maternal Uncle 65       in back- unsure if a bone cancer?  . Diabetes Son   . Heart disease Brother   . Breast cancer Paternal Aunt 62       lumpect and radiaion  . Breast cancer Paternal Aunt 5       lumpect and radiaion  . Lung cancer Paternal Uncle   . Heart disease Maternal Grandfather        hx smoking, died 'younger'  . Heart disease Paternal Grandfather 3    Social History: Social History   Tobacco Use  . Smoking status: Never Smoker  . Smokeless tobacco: Never Used  Substance Use Topics  . Alcohol use: No    Alcohol/week: 0.0 oz  . Drug use: No   Social History   Social History Narrative   Lives alone in a one story home.  Has 4 sons.  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  Education: GED.    Review of Systems:  CONSTITUTIONAL: No fevers, chills, night sweats, or weight loss.   EYES: No visual changes or eye pain +dry eyes ENT: No hearing changes.  No history of nose  bleeds.   RESPIRATORY: No cough, wheezing and shortness of breath.   CARDIOVASCULAR: Negative for chest pain, and  palpitations.   GI: Negative for abdominal discomfort, blood in stools or black stools.  No recent change in bowel habits.   GU:  No history of incontinence.   MUSCLOSKELETAL: No history of joint pain or swelling.  No myalgias.   SKIN: Negative for lesions, rash, and itching.   HEMATOLOGY/ONCOLOGY: Negative for prolonged bleeding, bruising easily, and swollen nodes.  No history of cancer.   ENDOCRINE: Negative for cold or heat intolerance, polydipsia or goiter.   PSYCH:  No depression or anxiety symptoms.   NEURO: As Above.   Vital Signs:  BP 140/84   Pulse 67   Ht 4\' 11"  (1.499 m)   Wt 128 lb (58.1 kg)   SpO2 97%   BMI 25.85 kg/m    General Medical Exam:   General:  Well appearing, comfortable, sitting on rollator.   Eyes/ENT: see cranial nerve examination.   Neck: No masses appreciated.  Full range of motion without tenderness.  No carotid bruits. Respiratory:  Clear to auscultation, good air entry bilaterally.   Cardiac:  Regular rate and rhythm, no murmur.   Extremities:  No deformities, edema, or skin discoloration.  Skin:  No rashes or lesions.  Neurological Exam: MENTAL STATUS including orientation to time, place, person, recent and remote memory, attention span and concentration, language, and fund of knowledge is normal.  Speech is not dysarthric.  CRANIAL NERVES: II:  No visual field defects.  Unremarkable fundi.   III-IV-VI: Pupils equal round and reactive to light.  Normal conjugate, extra-ocular eye movements in all directions of gaze.  No nystagmus.  No ptosis.   V:  Normal facial sensation.    VII:  Normal facial symmetry and movements.  Buccinator is 4+/5.  Orbicularis oris and oculi are 5-/5 VIII:  Normal hearing and vestibular function.   IX-X:  Normal palatal movement.   XI:  Normal shoulder shrug and head rotation.   XII:  Normal tongue  strength, severely reduced side to side movements and slowed movement.  No fasciculation.  MOTOR: Generalized loss of muscle mulk, atrophy of forearm finger flexors muscles.  No fasciculations or abnormal movements.  No pronator drift.  Tone is normal.    Right Upper Extremity:    Left Upper Extremity:    Deltoid  5/5   Deltoid  5/5   Biceps  5/5   Biceps  5/5   Triceps  4+/5   Triceps  4/5   Wrist extensors  5-/5   Wrist extensors  4/5   Wrist flexors  5-/5   Wrist flexors  4-/5   Finger extensors  4/5   Finger extensors  4-/5   Finger flexors at DIP 1/5   Finger flexors at DIP 1/5   Finger flexors at PIP 4/5  Finger flexors at PIP 4-/5  Dorsal interossei  5-/5   Dorsal interossei  4/5   Abductor pollicis  5/5   Abductor pollicis  4/5   Tone (Ashworth scale)  0  Tone (Ashworth scale)  0   Right Lower Extremity:    Left Lower Extremity:    Hip flexors  3+/5   Hip flexors  4/5   Hip extensors  5/5   Hip extensors  5/5   Knee flexors  5/5   Knee flexors  5/5   Knee extensors  4/5   Knee extensors  4/5   Dorsiflexors  5-/5   Dorsiflexors  4/5   Plantarflexors  5-/5   Plantarflexors  5/5   Toe extensors  5-/5   Toe extensors  4/5   Toe flexors  5-/5   Toe flexors  5/5   Tone (Ashworth scale)  0  Tone (Ashworth scale)  0   MSRs:  Right                                                                 Left brachioradialis 2+  brachioradialis 2+  biceps 2+  biceps 2+  triceps 2+  triceps 2+  patellar 0  Patellar 0  ankle jerk 0  ankle jerk 0  plantar response down  plantar response down   SENSORY: Vibration is reduced at the feet bilaterally, intact at the knees and hands.    COORDINATION/GAIT: Normal finger-to- nose-finger. Imprecision with finger tapping bilaterally due to weakness. Unable to rise from a chair without using arms.  Gait narrow wide-based, assisted with rollator and there is mild left foot drop.   IMPRESSION: Ms. Ang is a 72 year-old female with long history of  Sjogren's disease and myositis manifesting with hip flexion weakness.  Over the past 20 years, she has developed progressive weakness with hip flexion, knee extension, dysphagia, and finger flexion, which is consistent with inclusion body myositis.  I do agree with Dr. 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  Polymyositis tends to have more limb-girdle involvement and is symmetric, as well as responsive to immunotherapy.  Unfortunately, IBM is poorly responsive.  When there is co-existing autoimmune disorder, there can be a role for prednisone or azathioprine and I recommended that she discuss this with Dr. Jenetta Downer who has already suggested trying alternative immunotherapy.   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  Pain is not severe, she declined any medication or further testing with NCS/EMG.  Because her left foot weakness places her at risk to fall, I will have her see orthotics for left AFO.  She is very well-informed of her medication history and had many questions which were answered to the best of my ability  She is welcome to return for any new neurological concerns.  The duration of this appointment visit was 60 minutes of face-to-face time with the patient.  Greater than 50% of this time was spent in counseling, explanation of diagnosis, planning of further management, and coordination of care.   Thank you for allowing me to participate in patient's care.  If I can answer any additional questions, I would be pleased to do so.    Sincerely,    Donika K. Posey Pronto, DO

## 2018-03-18 NOTE — Progress Notes (Deleted)
Dg no

## 2018-04-01 ENCOUNTER — Inpatient Hospital Stay: Payer: Medicare HMO | Attending: Hematology and Oncology

## 2018-04-01 DIAGNOSIS — Z7982 Long term (current) use of aspirin: Secondary | ICD-10-CM | POA: Insufficient documentation

## 2018-04-01 DIAGNOSIS — D508 Other iron deficiency anemias: Secondary | ICD-10-CM

## 2018-04-01 DIAGNOSIS — E538 Deficiency of other specified B group vitamins: Secondary | ICD-10-CM

## 2018-04-01 DIAGNOSIS — D509 Iron deficiency anemia, unspecified: Secondary | ICD-10-CM | POA: Diagnosis present

## 2018-04-01 DIAGNOSIS — M3500 Sicca syndrome, unspecified: Secondary | ICD-10-CM | POA: Diagnosis not present

## 2018-04-01 DIAGNOSIS — Z803 Family history of malignant neoplasm of breast: Secondary | ICD-10-CM | POA: Insufficient documentation

## 2018-04-01 LAB — CMP (CANCER CENTER ONLY)
ALBUMIN: 3.8 g/dL (ref 3.5–5.0)
ALT: 16 U/L (ref 0–55)
ANION GAP: 8 (ref 3–11)
AST: 25 U/L (ref 5–34)
Alkaline Phosphatase: 65 U/L (ref 40–150)
BUN: 17 mg/dL (ref 7–26)
CALCIUM: 9.9 mg/dL (ref 8.4–10.4)
CO2: 28 mmol/L (ref 22–29)
Chloride: 101 mmol/L (ref 98–109)
Creatinine: 0.66 mg/dL (ref 0.60–1.10)
GFR, Est AFR Am: >60 mL/min (ref >60)
GFR, Estimated: >60 mL/min (ref >60)
GLUCOSE: 80 mg/dL (ref 70–140)
Potassium: 4.5 mmol/L (ref 3.5–5.1)
SODIUM: 137 mmol/L (ref 136–145)
Total Bilirubin: 0.4 mg/dL (ref 0.2–1.2)
Total Protein: 7.1 g/dL (ref 6.4–8.3)

## 2018-04-01 LAB — CBC WITH DIFFERENTIAL (CANCER CENTER ONLY)
BASOS ABS: 0 10*3/uL (ref 0.0–0.1)
BASOS PCT: 0 %
EOS ABS: 0.3 10*3/uL (ref 0.0–0.5)
Eosinophils Relative: 4 %
HEMATOCRIT: 39.4 % (ref 34.8–46.6)
HEMOGLOBIN: 13.1 g/dL (ref 11.6–15.9)
Lymphocytes Relative: 21 %
Lymphs Abs: 1.4 10*3/uL (ref 0.9–3.3)
MCH: 32.7 pg (ref 25.1–34.0)
MCHC: 33.2 g/dL (ref 31.5–36.0)
MCV: 98.3 fL (ref 79.5–101.0)
MONOS PCT: 10 %
Monocytes Absolute: 0.7 10*3/uL (ref 0.1–0.9)
NEUTROS PCT: 65 %
Neutro Abs: 4.5 10*3/uL (ref 1.5–6.5)
Platelet Count: 244 10*3/uL (ref 145–400)
RBC: 4.01 MIL/uL (ref 3.70–5.45)
RDW: 13.2 % (ref 11.2–14.5)
WBC: 6.8 10*3/uL (ref 3.9–10.3)

## 2018-04-01 LAB — FERRITIN: Ferritin: 165 ng/mL (ref 9–269)

## 2018-04-01 LAB — IRON AND TIBC
Iron: 108 ug/dL (ref 41–142)
SATURATION RATIOS: 40 % (ref 21–57)
TIBC: 268 ug/dL (ref 236–444)
UIBC: 159 ug/dL

## 2018-04-01 LAB — VITAMIN B12: VITAMIN B 12: 340 pg/mL (ref 180–914)

## 2018-04-03 ENCOUNTER — Inpatient Hospital Stay: Payer: Medicare HMO

## 2018-04-03 ENCOUNTER — Inpatient Hospital Stay (HOSPITAL_BASED_OUTPATIENT_CLINIC_OR_DEPARTMENT_OTHER): Payer: Medicare HMO | Admitting: Hematology and Oncology

## 2018-04-03 ENCOUNTER — Encounter: Payer: Self-pay | Admitting: Hematology and Oncology

## 2018-04-03 ENCOUNTER — Telehealth: Payer: Self-pay | Admitting: Hematology and Oncology

## 2018-04-03 VITALS — BP 144/78 | HR 63 | Temp 98.7°F | Resp 17 | Ht 59.0 in | Wt 127.0 lb

## 2018-04-03 DIAGNOSIS — D519 Vitamin B12 deficiency anemia, unspecified: Secondary | ICD-10-CM

## 2018-04-03 DIAGNOSIS — M3500 Sicca syndrome, unspecified: Secondary | ICD-10-CM | POA: Diagnosis not present

## 2018-04-03 DIAGNOSIS — D509 Iron deficiency anemia, unspecified: Secondary | ICD-10-CM

## 2018-04-03 DIAGNOSIS — E538 Deficiency of other specified B group vitamins: Secondary | ICD-10-CM | POA: Diagnosis not present

## 2018-04-03 DIAGNOSIS — Z803 Family history of malignant neoplasm of breast: Secondary | ICD-10-CM | POA: Diagnosis not present

## 2018-04-03 DIAGNOSIS — D508 Other iron deficiency anemias: Secondary | ICD-10-CM

## 2018-04-03 DIAGNOSIS — Z7982 Long term (current) use of aspirin: Secondary | ICD-10-CM | POA: Diagnosis not present

## 2018-04-03 LAB — METHYLMALONIC ACID, SERUM: METHYLMALONIC ACID, QUANTITATIVE: 620 nmol/L — AB (ref 0–378)

## 2018-04-03 NOTE — Telephone Encounter (Signed)
Scheduled appt per 4/17 los - Gave patient AVS and calender per los.  

## 2018-04-21 ENCOUNTER — Encounter: Payer: Self-pay | Admitting: Hematology and Oncology

## 2018-04-21 NOTE — Assessment & Plan Note (Signed)
72 y.o. female with history of iron deficiency anemia treated with parenteral iron due to poor tolerance of oral supplements.  Patient has received 2 treatments with Feraheme and labs obtained today demonstrate resolution of the anemia, normalization of the MCV MCH, and significant improvement of the ferritin value.  Repeat lab work obtained prior to this visit in the clinic demonstrates improving hemoglobin, decreasing ferritin consistent with utilization of iron by bone marrow and the rest of the body, and elevated methylmalonic acid level with low normal vitamin B12 levels.  Patient likely has subclinical B12 deficiency brought upon by increased demand from recovering bone marrow.  Plan: -No need for Feraheme today - Patient recommended to start oral B12 supplementation with the stress complex or vitamin B12 supplement to include 1000 mcg per day. -Return to clinic in 3 months with labs prior to the clinic visit, clinic visit and possible Feraheme infusion.  Our goal is to keep ferritin between 50 and 100 in this patient with chronic inflammatory state due to combination of Sjogren's disease and myositis

## 2018-04-21 NOTE — Progress Notes (Signed)
[name] Cancer Follow-up Visit:  Assessment: Iron deficiency anemia 72 y.o. female with history of iron deficiency anemia treated with parenteral iron due to poor tolerance of oral supplements.  Patient has received 2 treatments with Feraheme and labs obtained today demonstrate resolution of the anemia, normalization of the MCV MCH, and significant improvement of the ferritin value.  Repeat lab work obtained prior to this visit in the clinic demonstrates improving hemoglobin, decreasing ferritin consistent with utilization of iron by bone marrow and the rest of the body, and elevated methylmalonic acid level with low normal vitamin B12 levels.  Patient likely has subclinical B12 deficiency brought upon by increased demand from recovering bone marrow.  Plan: -No need for Feraheme today - Patient recommended to start oral B12 supplementation with the stress complex or vitamin B12 supplement to include 1000 mcg per day. -Return to clinic in 3 months with labs prior to the clinic visit, clinic visit and possible Feraheme infusion.  Our goal is to keep ferritin between 50 and 100 in this patient with chronic inflammatory state due to combination of Sjogren's disease and myositis   Voice recognition software was used and creation of this note. Despite my best effort at editing the text, some misspelling/errors may have occurred.  Orders Placed This Encounter  Procedures  . CBC with Differential (Cancer Center Only)    Standing Status:   Future    Standing Expiration Date:   04/04/2019  . CMP ([facility] only)    Standing Status:   Future    Standing Expiration Date:   04/04/2019  . Iron and TIBC    Standing Status:   Future    Standing Expiration Date:   04/04/2019  . Ferritin    Standing Status:   Future    Standing Expiration Date:   04/04/2019    Cancer Staging No matching staging information was found for the patient.  All questions were answered.  . The patient  knows to call the clinic with any problems, questions or concerns.  This note was electronically signed.    History of Presenting Illness [name] is a 72 y.o. female followed in the [facility] for diagnosis of iron deficiency anemia.  Her past medical history is significant for Sjogren's disease as well as myositis. She has been diagnosed with iron deficiency anemia in the past and was evaluated by Dr. Ralene Ok and was prescribed oral iron which she has been taking intermittently since 2013. She reported poor tolerance to oral iron including dyspepsia and constipation. He also had difficulty swallowing these pills. She had a extensive GI workup including colonoscopy the last of which was on 09/26/17 without any evidence of GI blood losses. Her GI workup for celiac disease was also negative. Abbreviated review of hematological lab work is available below.  Patient was seen by Dr. Alen Blew in October 2018 and received 2 doses of Feraheme for treatment.  On initial presentation to hematology, she didreport symptoms of fatigue and tiredness but those have been chronic in nature and related to her autoimmune disease. She denied any hematochezia or melena. She denied any hemoptysis or hematemesis. She still lives independently and drives short distances. She ambulates with the help of a walker mostly related to muscle weakness and a back operation in 2017. She is not report any headaches, blurry vision, syncope or seizures. She does not report any fevers, chills or sweats. She does not report any cough, wheezing or hemoptysis. She does not report  any nausea, vomiting or abdominal pain. She did not report any chest pain, palpitation, orthopnea or leg edema. She did not report any skeletal complaints. She did not report any frequency urgency or hesitancy.   Oncological/hematological History: --Labs, Aug 2018:                                                                                                          Fe 33, FeSat 9% --Labs, Sep 2018:                                                                                                                                                           Ferritin   16; --Labs, 10/03/17: Hgb 11.0, MCV 81.5, MCH 26.9, MCHC 33.0, RDW 16.6, Plt 306; Fe 38, FeSat 10%, TIBC 391,  Ferritin   18, Vit B12 335; Epo 15.2 --Treatment:             --Feraheme 53m IV x2, Oct-Nov 2018 --Labs, 01/03/18: Hgb 12.8, MCV 94.7, MCH 31.0, MCHC 32.7, RDW 16.6, Plt 230;                                                 Ferritin 296 --Labs, 04/01/18: Hgb 13.1, MCV 98.3, MCH 32.7, MCHC 33.2, RDW 13.2, Plt 244; Fe 108, FeSat 40%, TIBC 268,  Ferritin 165, Vit B12 340, MMA 620;     No history exists.    Medical History: Past Medical History:  Diagnosis Date  . AAA (abdominal aortic aneurysm) ([place]    small      report in epic 11/11 states no aaa    . Cataract    surgery on both eyes  . Chronic lower back pain    "for 6 years; relieved since back OR 07/2013" (09/08/2013)  . Difficult intubation    "ive been told im small", no problems listed with back sx in past  . Diverticulosis   . Family history of anesthesia complication    "sister has severe sleep apnea; she has alot of trouble w/anesthesia" (09/08/2013)  . Family history of breast cancer   . Family history of breast cancer in female   . Family history of lung cancer   . Family history of  ovarian cancer   . Gastritis   . GERD (gastroesophageal reflux disease)   . Glaucoma   . Hiatal hernia   . High cholesterol    "not severe; can't take the medicine" (09/08/2013)  . History of hiatal hernia    "small one"  . HTN (hypertension)   . Iron deficiency anemia    hx  . Migraines    "hurt like a migraine; don't get them like I did when I was younger" (09/08/2013)  . Multiple thyroid nodules   . OA (osteoarthritis)   . Osteoporosis   . Pancreatitis   . PHN (postherpetic neuralgia)    right side from shingles  .  Polymyositis ([place])   . Raynaud phenomenon   . 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 Shingles   . Sjogren's syndrome ([place])   . Swallowing difficulty    unable to swallow whole pills  . Swelling of left lower extremity     Surgical History: Past Surgical History:  Procedure Laterality Date  . ABDOMINAL EXPOSURE N/A 06/02/2016   Procedure: ABDOMINAL EXPOSURE;  Surgeon: Rosetta Posner, MD;  Location: [facility];  Service: Vascular;  Laterality: N/A;  . ANTERIOR LAT LUMBAR FUSION  06/02/2016   Procedure: Thoracic twelve-Lumbar one, Lumbar one-two anterior Lateral Interbody Fusion;  Surgeon: Erline Levine, MD;  Location: [facility];  Service: Neurosurgery;;  . ANTERIOR LUMBAR FUSION N/A 06/02/2016   Procedure: Lumbar five-sacral one Anterior lumbar interbody fusion with Dr. Donnetta Hutching for approach;  Surgeon: Erline Levine, MD;  Location: [place] NEUR[facility];  Service: Neurosurgery;  Laterality: N/A;  . APPLICATION OF INTRAOPERATIVE CT SCAN N/A 06/02/2016   Procedure: APPLICATION OF INTRAOPERATIVE CT SCAN;  Surgeon: Erline Levine, MD;  Location: [facility] NEURO ORS;  Service: Neurosurgery;  Laterality: N/A;  . CATARACT EXTRACTION W/ INTRAOCULAR LENS  IMPLANT, BILATERAL Bilateral 12/2010-01/2011  . DENTAL SURGERY  11/2011-12/2011   one on each side in my bottom jaw; dental implants" (09/08/2013)  . ESOPHAGEAL DILATION     "a few times before 02/2010; not since" (09/08/2013)  . ESOPHAGEAL MANOMETRY N/A 02/25/2016   Procedure: ESOPHAGEAL MANOMETRY (EM);  Surgeon: Manus Gunning, MD;  Location: [facility];  Service: Gastroenterology;  Laterality: N/A;  . ESOPHAGOGASTRODUODENOSCOPY    . LUMBAR FUSION  Aug 14, 2013   Dr. Consuello Masse  . POSTERIOR LUMBAR FUSION 4 LEVEL Bilateral 06/02/2016   Procedure: Exploration of Fusion Lumbar two-five, Thoracic ten-ileum posterior fusion, Posterolateral arthrodesis with AIRO;  Surgeon: Erline Levine, MD;  Location: [facility] NEUR[facility] ORS;  Service: Neurosurgery;  Laterality: Bilateral;  . REPAIR ZENKER'S  DIVERTICULA  02/2010   "diverticulectomy w/zenker's pouch correction" (09/08/2013)    Family History: Family History  Problem Relation Age of Onset  . Ovarian cancer Mother 47  . Hypertension Father   . Heart disease Father   . Breast cancer Father 33       lumpectomy and tamoxifen  . Colon cancer Father 74  . Psoriasis Sister   . Sarcoidosis Sister   . Arthritis Sister        psoriatic arthritis  . Diabetes Sister   . Arthritis Maternal Uncle        rheumatiod arthritis  . Cancer Maternal Uncle 65       in back- unsure if a bone cancer?  . Diabetes Son   . Heart disease Brother   . Breast cancer Paternal Aunt 27       lumpect and radiaion  . Breast cancer Paternal Aunt 41  lumpect and radiaion  . Lung cancer Paternal Uncle   . Heart disease Maternal Grandfather        hx smoking, died 'younger'  . Heart disease Paternal Grandfather 59    Social History: Social History   Socioeconomic History  . Marital status: Widowed    Spouse name: Not on file  . Number of children: 4  . Years of education: GED  . Highest education level: Not on file  Occupational History  . Not on file  Social Needs  . Financial resource strain: Not on file  . Food insecurity:    Worry: Not on file    Inability: Not on file  . Transportation needs:    Medical: Not on file    Non-medical: Not on file  Tobacco Use  . Smoking status: Never Smoker  . Smokeless tobacco: Never Used  Substance and Sexual Activity  . Alcohol use: No    Alcohol/week: 0.0 oz  . Drug use: No  . Sexual activity: Never    Birth control/protection: Post-menopausal    Comment: 1st intercourse 32 yo-1 partner  Lifestyle  . Physical activity:    Days per week: Not on file    Minutes per session: Not on file  . Stress: Not on file  Relationships  . Social connections:    Talks on phone: Not on file    Gets together: Not on file    Attends religious service: Not on file    Active member of club or  organization: Not on file    Attends meetings of clubs or organizations: Not on file    Relationship status: Not on file  . Intimate partner violence:    Fear of current or ex partner: Not on file    Emotionally abused: Not on file    Physically abused: Not on file    Forced sexual activity: Not on file  Other Topics Concern  . Not on file  Social History Narrative   Lives alone in a one story home.  Has 4 sons.  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  Education: GED.    Allergies: Allergies  Allergen Reactions  . Macrodantin [Nitrofurantoin Macrocrystal]     Liquid form--Can't tolerate  . Phenylephrine     Other reaction(s): Other (See Comments) -- phenylephrine eye drops only  Eye irritation, severe photophobia  . Lisinopril Cough    Medications:  Current Outpatient Medications  Medication Sig Dispense Refill  . acetaminophen (TYLENOL) 500 MG tablet Take 500 mg by mouth every 6 (six) hours as needed.    . AMBULATORY NON FORMULARY MEDICATION 1 Units by Other route daily. Left AFO 1 Units 0  . Ascorbic Acid (VITAMIN C PO) Take 125 mg by mouth.    Marland Kitchen aspirin 81 MG chewable tablet Chew 1 tablet (81 mg total) by mouth daily.    . hydroxychloroquine (PLAQUENIL) 200 MG tablet Take 200 mg by mouth daily.    Marland Kitchen LOSARTAN POTASSIUM PO Take 50 mg by mouth daily.    . Magnesium 250 MG TABS Take 250 mg by mouth 2 (two) times daily.    . Magnesium Oxide 250 MG TABS Take by mouth.    . Multiple Vitamin (MULTIVITAMIN) tablet Take 1 tablet by mouth daily.    . predniSONE (DELTASONE) 1 MG tablet     . predniSONE (DELTASONE) 5 MG tablet Take 1 tablet (5 mg total) by mouth daily with breakfast. (Patient taking differently: Take 2 mg by mouth daily with breakfast. )  30 tablet 0  . Tetrahydrozoline HCl (VISINE OP) Place 1 drop into both eyes daily as needed (dry eyes). Reported on 05/16/2016    . timolol (BETIMOL) 0.5 % ophthalmic solution Place 1 drop into both eyes at bedtime.    . traMADol  (ULTRAM) 50 MG tablet Take 1 tablet (50 mg total) by mouth 4 (four) times daily - after meals and at bedtime. 30 tablet 0  . Wheat Dextrin (BENEFIBER PO) Take by mouth daily.     No current facility-administered medications for this visit.     Review of Systems: Review of Systems  All other systems reviewed and are negative.    PHYSICAL EXAMINATION Blood pressure (!) 144/78, pulse 63, temperature 98.7 F (37.1 C), temperature source Oral, resp. rate 17, height _0  (1.499 m), weight 127 lb (57.6 kg), SpO2 99 %.  ECOG PERFORMANCE STATUS: 0 - Asymptomatic  Physical Exam  Constitutional: She is oriented to person, place, and time. She appears well-developed and well-nourished. No distress.  HENT:  Head: Normocephalic and atraumatic.  Mouth/Throat: Oropharynx is clear and moist. No oropharyngeal exudate.  Eyes: Pupils are equal, round, and reactive to light. Conjunctivae and EOM are normal. No scleral icterus.  Neck: No thyromegaly present.  Cardiovascular: Normal rate, regular rhythm, normal heart sounds and intact distal pulses. Exam reveals no gallop and no friction rub.  No murmur heard. Pulmonary/Chest: Effort normal and breath sounds normal. No stridor. No respiratory distress. She has no wheezes. She has no rales.  Abdominal: Soft. Bowel sounds are normal. She exhibits no distension and no mass. There is no tenderness. There is no guarding.  Musculoskeletal: She exhibits no edema.  Lymphadenopathy:    She has no cervical adenopathy.  Neurological: She is alert and oriented to person, place, and time. She displays normal reflexes. No cranial nerve deficit or sensory deficit.  Skin: Skin is warm and dry. No rash noted. She is not diaphoretic. No erythema. No pallor.     LABORATORY DATA: I have personally reviewed the data as listed: Appointment on 04/01/2018  Component Date Value Ref Range Status  . WBC Count 04/01/2018 6.8  3.9 - 10.3 K/uL Final  . RBC 04/01/2018 4.01   3.70 - 5.45 MIL/uL Final  . Hemoglobin 04/01/2018 13.1  11.6 - 15.9 g/dL Final  . HCT 04/01/2018 39.4  34.8 - 46.6 % Final  . MCV 04/01/2018 98.3  79.5 - 101.0 fL Final  . MCH 04/01/2018 32.7  25.1 - 34.0 pg Final  . MCHC 04/01/2018 33.2  31.5 - 36.0 g/dL Final  . RDW 04/01/2018 13.2  11.2 - 14.5 % Final  . Platelet Count 04/01/2018 244  145 - 400 K/uL Final  . Neutrophils Relative % 04/01/2018 65  % Final  . Neutro Abs 04/01/2018 4.5  1.5 - 6.5 K/uL Final  . Lymphocytes Relative 04/01/2018 21  % Final  . Lymphs Abs 04/01/2018 1.4  0.9 - 3.3 K/uL Final  . Monocytes Relative 04/01/2018 10  % Final  . Monocytes Absolute 04/01/2018 0.7  0.1 - 0.9 K/uL Final  . Eosinophils Relative 04/01/2018 4  % Final  . Eosinophils Absolute 04/01/2018 0.3  0.0 - 0.5 K/uL Final  . Basophils Relative 04/01/2018 0  % Final  . Basophils Absolute 04/01/2018 0.0  0.0 - 0.1 K/uL Final   Performed at [facility], [place] [address], [place], [place] [address]  . Sodium 04/01/2018 137  136 - 145 mmol/L Final  . Potassium 04/01/2018  4.5  3.5 - 5.1 mmol/L Final  . Chloride 04/01/2018 101  98 - 109 mmol/L Final  . CO2 04/01/2018 28  22 - 29 mmol/L Final  . Glucose, Bld 04/01/2018 80  70 - 140 mg/dL Final  . BUN 04/01/2018 17  7 - 26 mg/dL Final  . Creatinine 04/01/2018 0.66  0.60 - 1.10 mg/dL Final  . Calcium 04/01/2018 9.9  8.4 - 10.4 mg/dL Final  . Total Protein 04/01/2018 7.1  6.4 - 8.3 g/dL Final  . Albumin 04/01/2018 3.8  3.5 - 5.0 g/dL Final  . AST 04/01/2018 25  5 - 34 U/L Final  . ALT 04/01/2018 16  0 - 55 U/L Final  . Alkaline Phosphatase 04/01/2018 65  40 - 150 U/L Final  . Total Bilirubin 04/01/2018 0.4  0.2 - 1.2 mg/dL Final  . GFR, Est Non Af Am 04/01/2018 >60  >60 mL/min Final  . GFR, Est AFR Am 04/01/2018 >60  >60 mL/min Final   Comment: (NOTE) The eGFR has been calculated using the CKD EPI equation. This calculation has not been validated in all clinical  situations. eGFR's persistently <60 mL/min signify possible Chronic Kidney Disease.   Georgiann Hahn gap 04/01/2018 8  3 - 11 Final   Performed at [facility], [place] [address], [place], [place] [address]  . Iron 04/01/2018 108  41 - 142 ug/dL Final  . TIBC 04/01/2018 268  236 - 444 ug/dL Final  . Saturation Ratios 04/01/2018 40  21 - 57 % Final  . UIBC 04/01/2018 159  ug/dL Final   Performed at [facility], [place] [address], [place], [place] [address]  . Ferritin 04/01/2018 165  9 - 269 ng/mL Final   Performed at [facility], [place] [address] [place], [place] [address]  . Vitamin B-12 04/01/2018 340  180 - 914 pg/mL Final   Comment: (NOTE) This assay is not validated for testing neonatal or myeloproliferative syndrome specimens for Vitamin B12 levels. Performed at [facility], [place] [address], [place], [place] [address]   . Methylmalonic Acid, Quantitative 04/01/2018 620* 0 - 378 nmol/L Final  . Disclaimer: 04/01/2018 Comment   Final   Comment: (NOTE) This test was developed and its performance characteristics determined by [facility]. It has not been cleared or approved by the Food and Drug Administration. Performed At: [facility], [place] [id number] Rush Farmer MD MV:[id number] Performed at [facility], [place] [address] [place], [place] [address]        Ardath Sax, MD

## 2018-05-07 ENCOUNTER — Telehealth: Payer: Self-pay | Admitting: Oncology

## 2018-05-07 NOTE — Telephone Encounter (Signed)
Returned patient call re rescheduling appointments we changed due to see is out of town. Former [facility] patient to [facility]. Moved lab from 7/15 to 7/5 and fu/infusion from 7/22 to 7/8 due to [facility] out of office end of July with no other availability prior to patient going on vacation. Patient has new provider and appointment date/time for lab 7/5 and fu/infusion 7/8.

## 2018-05-20 ENCOUNTER — Telehealth: Payer: Self-pay | Admitting: Hematology and Oncology

## 2018-05-20 NOTE — Telephone Encounter (Signed)
Called pt re appt being moved to [facility] - spoke w/ pt and confirmed appt.

## 2018-05-28 DIAGNOSIS — M2550 Pain in unspecified joint: Secondary | ICD-10-CM | POA: Diagnosis not present

## 2018-05-28 DIAGNOSIS — M549 Dorsalgia, unspecified: Secondary | ICD-10-CM | POA: Diagnosis not present

## 2018-05-28 DIAGNOSIS — M150 Primary generalized (osteo)arthritis: Secondary | ICD-10-CM | POA: Diagnosis not present

## 2018-05-28 DIAGNOSIS — M3500 Sicca syndrome, unspecified: Secondary | ICD-10-CM | POA: Diagnosis not present

## 2018-05-28 DIAGNOSIS — M810 Age-related osteoporosis without current pathological fracture: Secondary | ICD-10-CM | POA: Diagnosis not present

## 2018-05-28 DIAGNOSIS — M609 Myositis, unspecified: Secondary | ICD-10-CM | POA: Diagnosis not present

## 2018-06-21 ENCOUNTER — Inpatient Hospital Stay: Payer: Medicare HMO | Attending: Hematology and Oncology

## 2018-06-21 DIAGNOSIS — D508 Other iron deficiency anemias: Secondary | ICD-10-CM

## 2018-06-21 DIAGNOSIS — M818 Other osteoporosis without current pathological fracture: Secondary | ICD-10-CM | POA: Diagnosis not present

## 2018-06-21 DIAGNOSIS — D509 Iron deficiency anemia, unspecified: Secondary | ICD-10-CM | POA: Insufficient documentation

## 2018-06-21 DIAGNOSIS — M069 Rheumatoid arthritis, unspecified: Secondary | ICD-10-CM | POA: Insufficient documentation

## 2018-06-21 DIAGNOSIS — G63 Polyneuropathy in diseases classified elsewhere: Secondary | ICD-10-CM | POA: Insufficient documentation

## 2018-06-21 DIAGNOSIS — M3509 Sicca syndrome with other organ involvement: Secondary | ICD-10-CM | POA: Diagnosis not present

## 2018-06-21 DIAGNOSIS — M3320 Polymyositis, organ involvement unspecified: Secondary | ICD-10-CM | POA: Diagnosis not present

## 2018-06-21 LAB — COMPREHENSIVE METABOLIC PANEL
ALK PHOS: 62 U/L (ref 38–126)
ALT: 19 U/L (ref 0–44)
ANION GAP: 9 (ref 5–15)
AST: 27 U/L (ref 15–41)
Albumin: 4.1 g/dL (ref 3.5–5.0)
BILIRUBIN TOTAL: 0.4 mg/dL (ref 0.3–1.2)
BUN: 11 mg/dL (ref 8–23)
CALCIUM: 10.7 mg/dL — AB (ref 8.9–10.3)
CO2: 30 mmol/L (ref 22–32)
CREATININE: 0.7 mg/dL (ref 0.44–1.00)
Chloride: 96 mmol/L — ABNORMAL LOW (ref 98–111)
GFR calc Af Amer: >60 mL/min (ref >60)
GFR calc non Af Amer: >60 mL/min (ref >60)
GLUCOSE: 91 mg/dL (ref 70–99)
Potassium: 4.7 mmol/L (ref 3.5–5.1)
Sodium: 135 mmol/L (ref 135–145)
TOTAL PROTEIN: 7.1 g/dL (ref 6.5–8.1)

## 2018-06-21 LAB — CBC WITH DIFFERENTIAL (CANCER CENTER ONLY)
BASOS PCT: 1 %
Basophils Absolute: 0 10*3/uL (ref 0.0–0.1)
Eosinophils Absolute: 0.1 10*3/uL (ref 0.0–0.5)
Eosinophils Relative: 2 %
HEMATOCRIT: 39.2 % (ref 34.8–46.6)
HEMOGLOBIN: 13.4 g/dL (ref 11.6–15.9)
Lymphocytes Relative: 19 %
Lymphs Abs: 1.3 10*3/uL (ref 0.9–3.3)
MCH: 32.3 pg (ref 25.1–34.0)
MCHC: 34.2 g/dL (ref 31.5–36.0)
MCV: 94.4 fL (ref 79.5–101.0)
MONOS PCT: 8 %
Monocytes Absolute: 0.6 10*3/uL (ref 0.1–0.9)
NEUTROS ABS: 4.7 10*3/uL (ref 1.5–6.5)
NEUTROS PCT: 70 %
Platelet Count: 256 10*3/uL (ref 145–400)
RBC: 4.15 MIL/uL (ref 3.70–5.45)
RDW: 13 % (ref 11.2–14.5)
WBC Count: 6.6 10*3/uL (ref 3.9–10.3)

## 2018-06-21 LAB — IRON AND TIBC
Iron: 92 ug/dL (ref 41–142)
SATURATION RATIOS: 35 % (ref 21–57)
TIBC: 261 ug/dL (ref 236–444)
UIBC: 169 ug/dL

## 2018-06-21 LAB — FERRITIN: Ferritin: 188 ng/mL (ref 11–307)

## 2018-06-23 NOTE — Progress Notes (Signed)
[place]  [phone] [phone]     ID: [name] DOB: [birth year]  MR#: [id number]  PPJ#:[id number]  Patient Care Team: Deland Pretty, MD as PCP - General (Internal Medicine) OTHER MD:  CHIEF COMPLAINT: Iron deficiency anemia in the setting of chronic inflammation   CURRENT TREATMENT: Observation   HISTORY OF CURRENT ILLNESS: [name] has a long history of iron deficiency anemia dating back to early 2009.  She was seen by my former partner Dr. Ralene Ok and a thorough evaluation including GI work-up failed to demonstrate a clear etiology.  She was found to have a history of Zenker's diverticulum and esophageal stricture.  There was no evidence of celiac disease.  Her anemia and her iron deficiency did respond to oral supplementation.  The patient has significant comorbidities including polymyositis, Sjogren's syndrome and Raynaud's phenomenon.  In October 2018 she was evaluated by another 1 of my partners, Dr. Alen Blew.  The patient received Feraheme 10/10/2017 and 10/17/2017.  She was then transferred to the care of our locum's, Dr. Lebron Conners, as of January 2019.  He noted after the Feraheme the anemia had resolved, and the MCV had normalized, and the ferritin had significantly improved.  He suggested monitoring to keep the ferritin between 50 and to 110 in this patient with a chronic inflammatory state.  At the 04/21/2018 visit he also noted subclinical B12 deficiency.  He recommended the start of oral B12 supplementation.  The patient's subsequent history is as detailed below   INTERVAL HISTORY: [name] was evaluated in the hematology clinic on 06/24/2018 accompanied by her son, [name]. She is being followed for iron deficiency.  She received [facility] October 2018 with excellent tolerance.  REVIEW OF SYSTEMS: The patient denies unusual headaches, visual changes, nausea, vomiting, stiff neck, dizziness, or gait imbalance. There has  been no cough, phlegm production, or pleurisy, no chest pain or pressure, and no change in bowel or bladder habits. The patient denies fever, rash, bleeding, unexplained fatigue or unexplained weight loss. A detailed review of systems was otherwise entirely negative.  PAST MEDICAL HISTORY: Past Medical History:  Diagnosis Date  . AAA (abdominal aortic aneurysm) ([place])    small      report in epic 11/11 states no aaa    . Acute blood loss anemia   . Cataract    surgery on both eyes  . Chronic lower back pain    "for 6 years; relieved since back OR 07/2013" (09/08/2013)  . Difficult intubation    "ive been told im small", no problems listed with back sx in past  . Diverticulosis   . Family history of anesthesia complication    "sister has severe sleep apnea; she has alot of trouble w/anesthesia" (09/08/2013)  . Family history of breast cancer   . Family history of breast cancer in female   . Family history of lung cancer   . Family history of ovarian cancer   . Gastritis   . GERD (gastroesophageal reflux disease)   . Glaucoma   . Hiatal hernia   . High cholesterol    "not severe; can't take the medicine" (09/08/2013)  . History of hiatal hernia    "small one"  . HTN (hypertension)   . Iron deficiency anemia    hx  . Migraines    "hurt like a migraine; don't get them like I did when I was younger" (09/08/2013)  . Multiple thyroid nodules   . OA (osteoarthritis)   .  Osteoporosis   . Pancreatitis   . PHN (postherpetic neuralgia)    right side from shingles  . Polymyositis ([place])   . Raynaud phenomenon   . 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 Shingles   . Sjogren's syndrome ([place])   . Swallowing difficulty    unable to swallow whole pills  . Swelling of left lower extremity     PAST SURGICAL HISTORY: Past Surgical History:  Procedure Laterality Date  . ABDOMINAL EXPOSURE N/A 06/02/2016   Procedure: ABDOMINAL EXPOSURE;  Surgeon: Rosetta Posner, MD;  Location: [facility]URO ORS;  Service:  Vascular;  Laterality: N/A;  . ANTERIOR LAT LUMBAR FUSION  06/02/2016   Procedure: Thoracic twelve-Lumbar one, Lumbar one-two anterior Lateral Interbody Fusion;  Surgeon: Erline Levine, MD;  Location: [place] NEURO ORS;  Service: Neurosurgery;;  . ANTERIOR LUMBAR FUSION N/A 06/02/2016   Procedure: Lumbar five-sacral one Anterior lumbar interbody fusion with Dr. Donnetta Hutching for approach;  Surgeon: Erline Levine, MD;  Location: [place] NEURO ORS;  Service: Neurosurgery;  Laterality: N/A;  . APPLICATION OF INTRAOPERATIVE CT SCAN N/A 06/02/2016   Procedure: APPLICATION OF INTRAOPERATIVE CT SCAN;  Surgeon: Erline Levine, MD;  Location: [place] NEURO ORS;  Service: Neurosurgery;  Laterality: N/A;  . CATARACT EXTRACTION W/ INTRAOCULAR LENS  IMPLANT, BILATERAL Bilateral 12/2010-01/2011  . DENTAL SURGERY  11/2011-12/2011   one on each side in my bottom jaw; dental implants" (09/08/2013)  . ESOPHAGEAL DILATION     "a few times before 02/2010; not since" (09/08/2013)  . ESOPHAGEAL MANOMETRY N/A 02/25/2016   Procedure: ESOPHAGEAL MANOMETRY (EM);  Surgeon: Manus Gunning, MD;  Location: [facility] ENDOSCOPY;  Service: Gastroenterology;  Laterality: N/A;  . ESOPHAGOGASTRODUODENOSCOPY    . LUMBAR FUSION  Aug 14, 2013   Dr. Consuello Masse  . POSTERIOR LUMBAR FUSION 4 LEVEL Bilateral 06/02/2016   Procedure: Exploration of Fusion Lumbar two-five, Thoracic ten-ileum posterior fusion, Posterolateral arthrodesis with AIRO;  Surgeon: Erline Levine, MD;  Location: [place] NEURO ORS;  Service: Neurosurgery;  Laterality: Bilateral;  . REPAIR ZENKER'S DIVERTICULA  02/2010   "diverticulectomy w/zenker's pouch correction" (09/08/2013)    FAMILY HISTORY Family History  Problem Relation Age of Onset  . Ovarian cancer Mother 76  . Hypertension Father   . Heart disease Father   . Breast cancer Father 59       lumpectomy and tamoxifen  . Colon cancer Father 56  . Psoriasis Sister   . Sarcoidosis Sister   . Arthritis Sister        psoriatic arthritis  . Diabetes  Sister   . Arthritis Maternal Uncle        rheumatiod arthritis  . Cancer Maternal Uncle 65       in back- unsure if a bone cancer?  . Diabetes Son   . Heart disease Brother   . Breast cancer Paternal Aunt 28       lumpect and radiaion  . Breast cancer Paternal Aunt 50       lumpect and radiaion  . Lung cancer Paternal Uncle   . Heart disease Maternal Grandfather        hx smoking, died 'younger'  . Heart disease Paternal Grandfather 6    GYNECOLOGIC HISTORY:  No LMP recorded. Patient is postmenopausal. Menarche: 72 years old Age at first live birth: 72 years old [place] P 4 LMP perimenopausal at 41, last period at 89 HRT no    SOCIAL HISTORY:  [name] is now retired. 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 She lives  by herself, no pets. Her husband passed away about 4 years ago with esophageal neuroendocrine cancer. She has four sons who all live in [place] and work at [employer]. Merry Proud was born in [birth year], Clyde in [place], Legrand Como in [place] and [name] in 1975. She has 7 grandchildren and 1 great grandchild. She attends a [employer].     ADVANCED DIRECTIVES: [name] is listed first as her power of attorney and Legrand Como is listed second   HEALTH MAINTENANCE: Social History   Tobacco Use  . Smoking status: Never Smoker  . Smokeless tobacco: Never Used  Substance Use Topics  . Alcohol use: No    Alcohol/week: 0.0 oz  . Drug use: No     Colonoscopy: 09/26/2017  PAP: Normal, 06/22/2014  Bone density: Osteoporosis   Allergies  Allergen Reactions  . Macrodantin [Nitrofurantoin Macrocrystal]     Liquid form--Can't tolerate  . Phenylephrine     Other reaction(s): Other (See Comments) -- phenylephrine eye drops only  Eye irritation, severe photophobia  . Lisinopril Cough    Current Outpatient Medications  Medication Sig Dispense Refill  . acetaminophen (TYLENOL) 500 MG tablet Take 500 mg by mouth every 6 (six) hours as needed.    . AMBULATORY  NON FORMULARY MEDICATION 1 Units by Other route daily. Left AFO 1 Units 0  . Ascorbic Acid (VITAMIN C PO) Take 125 mg by mouth.    Marland Kitchen aspirin 81 MG chewable tablet Chew 1 tablet (81 mg total) by mouth daily.    . hydroxychloroquine (PLAQUENIL) 200 MG tablet Take 200 mg by mouth daily.    Marland Kitchen LOSARTAN POTASSIUM PO Take 50 mg by mouth daily.    . Magnesium 250 MG TABS Take 250 mg by mouth 2 (two) times daily.    . Magnesium Oxide 250 MG TABS Take by mouth.    . Multiple Vitamin (MULTIVITAMIN) tablet Take 1 tablet by mouth daily.    . predniSONE (DELTASONE) 1 MG tablet     . predniSONE (DELTASONE) 5 MG tablet Take 1 tablet (5 mg total) by mouth daily with breakfast. (Patient taking differently: Take 2 mg by mouth daily with breakfast. ) 30 tablet 0  . Tetrahydrozoline HCl (VISINE OP) Place 1 drop into both eyes daily as needed (dry eyes). Reported on 05/16/2016    . timolol (BETIMOL) 0.5 % ophthalmic solution Place 1 drop into both eyes at bedtime.    . traMADol (ULTRAM) 50 MG tablet Take 1 tablet (50 mg total) by mouth 4 (four) times daily - after meals and at bedtime. 30 tablet 0  . Wheat Dextrin (BENEFIBER PO) Take by mouth daily.     No current facility-administered medications for this visit.     OBJECTIVE: Middle-aged white woman who appears frail  Vitals:   06/24/18 1107  BP: (!) 144/74  Pulse: 60  Resp: 18  Temp: 98.4 F (36.9 C)  SpO2: 98%     Body mass index is 25.25 kg/m.   Wt Readings from Last 3 Encounters:  06/24/18 125 lb (56.7 kg)  04/03/18 127 lb (57.6 kg)  03/18/18 128 lb (58.1 kg)      ECOG FS:2 - Symptomatic, <50% confined to bed  Ocular: Sclerae unicteric, pupils round and equal Ear-nose-throat: Oropharynx clear and moist Lymphatic: No cervical or supraclavicular adenopathy Lungs no rales or rhonchi Heart regular rate and rhythm Abd soft, nontender, positive bowel sounds MSK no focal spinal tenderness, no joint edema Neuro: non-focal, well-oriented,  appropriate affect Breasts:  Deferred  LAB RESULTS:  CMP     Component Value Date/Time   NA 135 06/21/2018 1121   NA 136 10/03/2017 1128   K 4.7 06/21/2018 1121   K 4.1 10/03/2017 1128   CL 96 (L) 06/21/2018 1121   CL 102 03/14/2013 1039   CO2 30 06/21/2018 1121   CO2 27 10/03/2017 1128   GLUCOSE 91 06/21/2018 1121   GLUCOSE 80 10/03/2017 1128   GLUCOSE 66 (L) 03/14/2013 1039   BUN 11 06/21/2018 1121   BUN 11.8 10/03/2017 1128   CREATININE 0.70 06/21/2018 1121   CREATININE 0.66 04/01/2018 1101   CREATININE 0.7 10/03/2017 1128   CALCIUM 10.7 (H) 06/21/2018 1121   CALCIUM 9.7 10/03/2017 1128   PROT 7.1 06/21/2018 1121   PROT 7.6 10/03/2017 1128   ALBUMIN 4.1 06/21/2018 1121   ALBUMIN 3.7 10/03/2017 1128   AST 27 06/21/2018 1121   AST 25 04/01/2018 1101   AST 25 10/03/2017 1128   ALT 19 06/21/2018 1121   ALT 16 04/01/2018 1101   ALT 10 10/03/2017 1128   ALKPHOS 62 06/21/2018 1121   ALKPHOS 77 10/03/2017 1128   BILITOT 0.4 06/21/2018 1121   BILITOT 0.4 04/01/2018 1101   BILITOT 0.46 10/03/2017 1128   GFRNONAA >60 06/21/2018 1121   GFRNONAA >60 04/01/2018 1101   GFRAA >60 06/21/2018 1121   GFRAA >60 04/01/2018 1101    Lab Results  Component Value Date   TOTALPROTELP 6.8 12/16/2009    No results found for: KPAFRELGTCHN, LAMBDASER, KAPLAMBRATIO  Lab Results  Component Value Date   WBC 6.6 06/21/2018   NEUTROABS 4.7 06/21/2018   HGB 13.4 06/21/2018   HCT 39.2 06/21/2018   MCV 94.4 06/21/2018   PLT 256 06/21/2018    _0 @  No results found for: LABCA2  No components found for: RZNBVA701  No results for input(s): INR in the last 168 hours.  No results found for: LABCA2  No results found for: IDC301  No results found for: THY388  No results found for: ILN797  No results found for: CA2729  No components found for: HGQUANT  No results found for: CEA1 / No results found for: CEA1   No results found for: AFPTUMOR  No results found for:  CHROMOGRNA  No results found for: PSA1  No visits with results within 3 Day(s) from this visit.  Latest known visit with results is:  Appointment on 06/21/2018  Component Date Value Ref Range Status  . Ferritin 06/21/2018 188  11 - 307 ng/mL Final   Performed at [facility], [place] [address] [place], [place] [address]  . Iron 06/21/2018 92  41 - 142 ug/dL Final  . TIBC 06/21/2018 261  236 - 444 ug/dL Final  . Saturation Ratios 06/21/2018 35  21 - 57 % Final  . UIBC 06/21/2018 169  ug/dL Final   Performed at [facility], [place] [address] [place], [place] [address]  . WBC Count 06/21/2018 6.6  3.9 - 10.3 K/uL Final  . RBC 06/21/2018 4.15  3.70 - 5.45 MIL/uL Final  . Hemoglobin 06/21/2018 13.4  11.6 - 15.9 g/dL Final  . HCT 06/21/2018 39.2  34.8 - 46.6 % Final  . MCV 06/21/2018 94.4  79.5 - 101.0 fL Final  . MCH 06/21/2018 32.3  25.1 - 34.0 pg Final  . MCHC 06/21/2018 34.2  31.5 - 36.0 g/dL Final  . RDW 06/21/2018 13.0  11.2 - 14.5 % Final  . Platelet  Count 06/21/2018 256  145 - 400 K/uL Final  . Neutrophils Relative % 06/21/2018 70  % Final  . Neutro Abs 06/21/2018 4.7  1.5 - 6.5 K/uL Final  . Lymphocytes Relative 06/21/2018 19  % Final  . Lymphs Abs 06/21/2018 1.3  0.9 - 3.3 K/uL Final  . Monocytes Relative 06/21/2018 8  % Final  . Monocytes Absolute 06/21/2018 0.6  0.1 - 0.9 K/uL Final  . Eosinophils Relative 06/21/2018 2  % Final  . Eosinophils Absolute 06/21/2018 0.1  0.0 - 0.5 K/uL Final  . Basophils Relative 06/21/2018 1  % Final  . Basophils Absolute 06/21/2018 0.0  0.0 - 0.1 K/uL Final   Performed at [facility], [place] [address] [place], [place] [address]  . Sodium 06/21/2018 135  135 - 145 mmol/L Final   Please note reference intervals were recently updated.  . Potassium 06/21/2018 4.7  3.5 - 5.1 mmol/L Final  . Chloride 06/21/2018 96* 98 - 111 mmol/L Final  . CO2 06/21/2018 30  22 - 32 mmol/L  Final  . Glucose, Bld 06/21/2018 91  70 - 99 mg/dL Final  . BUN 06/21/2018 11  8 - 23 mg/dL Final   Please note change in reference range.  . Creatinine, Ser 06/21/2018 0.70  0.44 - 1.00 mg/dL Final  . Calcium 06/21/2018 10.7* 8.9 - 10.3 mg/dL Final  . Total Protein 06/21/2018 7.1  6.5 - 8.1 g/dL Final  . Albumin 06/21/2018 4.1  3.5 - 5.0 g/dL Final  . AST 06/21/2018 27  15 - 41 U/L Final  . ALT 06/21/2018 19  0 - 44 U/L Final  . Alkaline Phosphatase 06/21/2018 62  38 - 126 U/L Final  . Total Bilirubin 06/21/2018 0.4  0.3 - 1.2 mg/dL Final  . GFR calc non Af Amer 06/21/2018 >60  >60 mL/min Final  . GFR calc Af Amer 06/21/2018 >60  >60 mL/min Final   Comment: (NOTE) The eGFR has been calculated using the CKD EPI equation. This calculation has not been validated in all clinical situations. eGFR's persistently <60 mL/min signify possible Chronic Kidney Disease.   Georgiann Hahn gap 06/21/2018 9  5 - 15 Final   Performed at [facility], [place] [address] [place], [place] [address]    (this displays the last labs from the last 3 days)  Lab Results  Component Value Date   TOTALPROTELP 6.8 12/16/2009   (this displays SPEP labs)  No results found for: KPAFRELGTCHN, LAMBDASER, KAPLAMBRATIO (kappa/lambda light chains)  No results found for: HGBA, HGBA2QUANT, HGBFQUANT, HGBSQUAN (Hemoglobinopathy evaluation)   Lab Results  Component Value Date   LDH 236 03/14/2013    Lab Results  Component Value Date   IRON 92 06/21/2018   TIBC 261 06/21/2018   IRONPCTSAT 35 06/21/2018   (Iron and TIBC)  Lab Results  Component Value Date   FERRITIN 188 06/21/2018    Urinalysis    Component Value Date/Time   COLORURINE YELLOW 06/17/2016 1111   APPEARANCEUR CLOUDY (A) 06/17/2016 1111   LABSPEC 1.022 06/17/2016 1111   PHURINE 8.0 06/17/2016 1111   GLUCOSEU NEGATIVE 06/17/2016 1111   HGBUR NEGATIVE 06/17/2016 1111   BILIRUBINUR NEGATIVE 06/17/2016 1111   KETONESUR  NEGATIVE 06/17/2016 1111   PROTEINUR NEGATIVE 06/17/2016 1111   UROBILINOGEN 0.2 10/27/2015 1038   NITRITE NEGATIVE 06/17/2016 1111   LEUKOCYTESUR LARGE (A) 06/17/2016 1111     STUDIES: No results found.  ELIGIBLE FOR AVAILABLE RESEARCH PROTOCOL: no  ASSESSMENT: 72 y.o.   (1) iron deficiency anemia:  (a) status post Feraheme x2 October 2018  (2)  chronic inflammation (Sjogren's, rheumatoid arthritis, polymyositis, inclusion body myositis)-- followed by Dr Kathlene November  (3) genetics testing 01/15/2018 through the Common Hereditary Cancer Panel offered by [facility] found no deleterious mutations in APC, ATM, AXIN2, BARD1, BMPR1A, BRCA1, BRCA2, BRIP1, CDH1, CDKN2A (p14ARF), CDKN2A (p16INK4a), CKD4, CHEK2, CTNNA1, DICER1, EPCAM (Deletion/duplication testing only), GREM1 (promoter region deletion/duplication testing only), KIT, MEN1, MLH1, MSH2, MSH3, MSH6, MUTYH, NBN, NF1, NHTL1, PALB2, PDGFRA, PMS2, POLD1, POLE, PTEN, RAD50, RAD51C, RAD51D, SDHB, SDHC, SDHD, SMAD4, SMARCA4. STK11, TP53, TSC1, TSC2, and VHL.  The following genes were evaluated for sequence changes only: SDHA and HOXB13 c.251G>A variant only.   PLAN: We spent the better part of today's 40-minute appointment discussing the biology of her diagnosis and the specifics of her situation.  [name] has had extensive GI work-up with no obvious bleeding cause.  The possibility of poor iron absorption has been discussed in the past.  What ever the cause of around deficiency, it responded very well to intravenous iron supplementation and currently she has a normal hemoglobin MCV and an excellent ferritin level.  All she needs at this point is follow-up.  We discussed various follow-up protocols and she opted for every 15-monthlab work.  This will be obtained October and April and I will see her a week after her April labs.  Of course if there is any evidence of recurrence of her iron deficiency she will have further Feraheme infusions.  She is  very satisfied with her current rheumatologist and those problems are being well managed at present  She knows to call for any other issues that may develop before her next visit here.    Traquan Duarte, GVirgie Dad MD  06/24/18 11:32 AM Medical Oncology [facility] H[facility] [place] [id number]. [phone]   Fax. [phone] I, JMargit Bandaam acting as a scribe for GChauncey Cruel MD.   I, GLurline DelMD, have reviewed the above documentation for accuracy and completeness, and I agree with the above.

## 2018-06-24 ENCOUNTER — Inpatient Hospital Stay: Payer: Medicare HMO

## 2018-06-24 ENCOUNTER — Telehealth: Payer: Self-pay | Admitting: Oncology

## 2018-06-24 ENCOUNTER — Inpatient Hospital Stay (HOSPITAL_BASED_OUTPATIENT_CLINIC_OR_DEPARTMENT_OTHER): Payer: Medicare HMO | Admitting: Oncology

## 2018-06-24 VITALS — BP 144/74 | HR 60 | Temp 98.4°F | Resp 18 | Ht 59.0 in | Wt 125.0 lb

## 2018-06-24 DIAGNOSIS — G63 Polyneuropathy in diseases classified elsewhere: Secondary | ICD-10-CM

## 2018-06-24 DIAGNOSIS — M3320 Polymyositis, organ involvement unspecified: Secondary | ICD-10-CM

## 2018-06-24 DIAGNOSIS — D509 Iron deficiency anemia, unspecified: Secondary | ICD-10-CM

## 2018-06-24 DIAGNOSIS — M3509 Sicca syndrome with other organ involvement: Secondary | ICD-10-CM

## 2018-06-24 DIAGNOSIS — M3503 Sicca syndrome with myopathy: Secondary | ICD-10-CM

## 2018-06-24 DIAGNOSIS — M069 Rheumatoid arthritis, unspecified: Secondary | ICD-10-CM | POA: Diagnosis not present

## 2018-06-24 DIAGNOSIS — M818 Other osteoporosis without current pathological fracture: Secondary | ICD-10-CM | POA: Diagnosis not present

## 2018-06-24 DIAGNOSIS — D508 Other iron deficiency anemias: Secondary | ICD-10-CM

## 2018-06-24 NOTE — Telephone Encounter (Signed)
Gave patient avs and calendar of upcoming appts.  °

## 2018-06-25 DIAGNOSIS — R69 Illness, unspecified: Secondary | ICD-10-CM | POA: Diagnosis not present

## 2018-06-28 DIAGNOSIS — Z961 Presence of intraocular lens: Secondary | ICD-10-CM | POA: Diagnosis not present

## 2018-06-28 DIAGNOSIS — M3501 Sicca syndrome with keratoconjunctivitis: Secondary | ICD-10-CM | POA: Diagnosis not present

## 2018-07-01 ENCOUNTER — Encounter: Payer: Self-pay | Admitting: Gynecology

## 2018-07-01 ENCOUNTER — Ambulatory Visit: Payer: Medicare HMO | Admitting: Gynecology

## 2018-07-01 ENCOUNTER — Other Ambulatory Visit: Payer: Medicare HMO

## 2018-07-01 VITALS — BP 122/78

## 2018-07-01 DIAGNOSIS — N8189 Other female genital prolapse: Secondary | ICD-10-CM | POA: Diagnosis not present

## 2018-07-01 NOTE — Progress Notes (Signed)
    [name] Jan 21, 1946 [id number]        71 y.o.  [name] presents for pessary check.  Has noted some mild vaginal staining with bowel movements.  Otherwise doing well.  Past medical history,surgical history, problem list, medications, allergies, family history and social history were all reviewed and documented in the EPIC chart.  Directed ROS with pertinent positives and negatives documented in the history of present illness/assessment and plan.  Exam: [name] assistant Vitals:   07/01/18 1531  BP: 122/78   General appearance:  Normal Abdomen soft nontender without mass guarding rebound Pelvic external BUS vagina with atrophic changes.  Ring pessary removed without difficulty.  Mild vaginal mucosal irritation noted with blood-tinged staining.  Cystocele and uterine prolapse noted.  Cervix atrophic.  Uterus grossly normal size midline mobile nontender.  Adnexa without masses.  Assessment/Plan:  72 y.o. [name] with vaginal mucosal irritation with pessary.  Recommended patient leave pessary out for 2 months assuming she will tolerate her prolapse and to use vaginal estrogen cream twice weekly to help strengthen the vaginal mucosa.  We reviewed how to use the cream and the risks to include absorption with systemic effects such as thrombosis including stroke heart attack DVT in the breast cancer/endometrial stimulation issues.  Patient will then follow-up in 2 months for reexamination and possible pessary replacement.  She did have the next smaller size previously but had an issue with spontaneous expulsion.  We may hopefully be able to continue the current pessary and with continued estradiol supplementation she will do well with this.  I spent a total of 15 face-to-face minutes with the patient, over 50% was spent counseling and coordination of care.     [name] MD, 4:40 PM 07/01/2018

## 2018-07-01 NOTE — Patient Instructions (Signed)
Start the vaginal estradiol cream twice weekly from [facility].  Follow-up for reexamination in 2 months.  Bring your pessary with you.  Call me sooner if you have any issues.

## 2018-07-02 ENCOUNTER — Telehealth: Payer: Self-pay | Admitting: *Deleted

## 2018-07-02 MED ORDER — NONFORMULARY OR COMPOUNDED ITEM: 0 refills | Status: DC

## 2018-07-02 NOTE — Telephone Encounter (Signed)
-----   Message from [name], MD sent at 07/01/2018  4:09 PM EDT ----- Call in vaginal estradiol prefilled syringes to [facility] use twice weekly, 49-month supply with no refills

## 2018-07-02 NOTE — Telephone Encounter (Signed)
Rx called in ,

## 2018-07-03 ENCOUNTER — Ambulatory Visit: Payer: Medicare HMO | Admitting: Hematology and Oncology

## 2018-07-03 ENCOUNTER — Ambulatory Visit: Payer: Medicare HMO

## 2018-07-08 ENCOUNTER — Ambulatory Visit: Payer: Medicare HMO | Admitting: Oncology

## 2018-07-08 ENCOUNTER — Ambulatory Visit: Payer: Medicare HMO

## 2018-07-19 ENCOUNTER — Telehealth: Payer: Self-pay | Admitting: *Deleted

## 2018-07-19 NOTE — Telephone Encounter (Signed)
I would actually recommend continuing to use the cream to keep the vaginal tissues healthier.  My fears if she stops then she may have a regression and thinning of the tissues.

## 2018-07-19 NOTE — Telephone Encounter (Signed)
Patient informed. 

## 2018-07-19 NOTE — Telephone Encounter (Signed)
patient was prescribed compound estradiol cream 0.02% to use twice weekly to help strengthen the vaginal mucosa, while pessary is out. Patient said she just came back from vacation and no longer has any of the issues, no bleeding, no irritation, doesn't notice prolapse as well. She was going to leave pessary out x 2 months as recommended and follow up in 2 months for re-examination and only use cream if symptoms return. She wanted to see if you are okay with this? Please advise

## 2018-08-16 DIAGNOSIS — H401132 Primary open-angle glaucoma, bilateral, moderate stage: Secondary | ICD-10-CM | POA: Diagnosis not present

## 2018-08-22 DIAGNOSIS — I10 Essential (primary) hypertension: Secondary | ICD-10-CM | POA: Diagnosis not present

## 2018-08-22 DIAGNOSIS — M810 Age-related osteoporosis without current pathological fracture: Secondary | ICD-10-CM | POA: Diagnosis not present

## 2018-08-22 DIAGNOSIS — E7800 Pure hypercholesterolemia, unspecified: Secondary | ICD-10-CM | POA: Diagnosis not present

## 2018-08-26 DIAGNOSIS — H401122 Primary open-angle glaucoma, left eye, moderate stage: Secondary | ICD-10-CM | POA: Diagnosis not present

## 2018-08-26 DIAGNOSIS — H401112 Primary open-angle glaucoma, right eye, moderate stage: Secondary | ICD-10-CM | POA: Diagnosis not present

## 2018-08-27 DIAGNOSIS — M810 Age-related osteoporosis without current pathological fracture: Secondary | ICD-10-CM | POA: Diagnosis not present

## 2018-08-27 DIAGNOSIS — E559 Vitamin D deficiency, unspecified: Secondary | ICD-10-CM | POA: Diagnosis not present

## 2018-08-27 DIAGNOSIS — E7800 Pure hypercholesterolemia, unspecified: Secondary | ICD-10-CM | POA: Diagnosis not present

## 2018-08-27 DIAGNOSIS — K219 Gastro-esophageal reflux disease without esophagitis: Secondary | ICD-10-CM | POA: Diagnosis not present

## 2018-08-27 DIAGNOSIS — I7300 Raynaud's syndrome without gangrene: Secondary | ICD-10-CM | POA: Diagnosis not present

## 2018-08-27 DIAGNOSIS — E041 Nontoxic single thyroid nodule: Secondary | ICD-10-CM | POA: Diagnosis not present

## 2018-08-27 DIAGNOSIS — I700 Atherosclerosis of aorta: Secondary | ICD-10-CM | POA: Diagnosis not present

## 2018-08-27 DIAGNOSIS — Z0000 Encounter for general adult medical examination without abnormal findings: Secondary | ICD-10-CM | POA: Diagnosis not present

## 2018-08-27 DIAGNOSIS — I10 Essential (primary) hypertension: Secondary | ICD-10-CM | POA: Diagnosis not present

## 2018-08-27 DIAGNOSIS — R1310 Dysphagia, unspecified: Secondary | ICD-10-CM | POA: Diagnosis not present

## 2018-08-28 ENCOUNTER — Other Ambulatory Visit: Payer: Self-pay | Admitting: Internal Medicine

## 2018-08-28 ENCOUNTER — Other Ambulatory Visit: Payer: Self-pay | Admitting: Gynecology

## 2018-08-28 DIAGNOSIS — E041 Nontoxic single thyroid nodule: Secondary | ICD-10-CM

## 2018-08-28 DIAGNOSIS — Z1231 Encounter for screening mammogram for malignant neoplasm of breast: Secondary | ICD-10-CM

## 2018-09-02 ENCOUNTER — Encounter: Payer: Self-pay | Admitting: Gynecology

## 2018-09-02 ENCOUNTER — Ambulatory Visit: Payer: Medicare HMO | Admitting: Gynecology

## 2018-09-02 VITALS — BP 122/80

## 2018-09-02 DIAGNOSIS — N952 Postmenopausal atrophic vaginitis: Secondary | ICD-10-CM

## 2018-09-02 DIAGNOSIS — N8189 Other female genital prolapse: Secondary | ICD-10-CM

## 2018-09-02 NOTE — Patient Instructions (Addendum)
Follow-up for your annual exam as scheduled.  Continue on the vaginal estrogen cream twice weekly from [facility].  A refill will be called to the pharmacy

## 2018-09-02 NOTE — Progress Notes (Signed)
    [name] December 09, 1946 [id number]        72 y.o.  [id number] presents for pessary check.  Patient uses ring pessary for uterine prolapse and cystocele.  She was having some issues with vaginal irritation and bleeding and we left her pessary out since July and she started on vaginal estradiol cream twice weekly.  She notes no further bleeding and is not having issues as far as prolapse right now.  Past medical history,surgical history, problem list, medications, allergies, family history and social history were all reviewed and documented in the EPIC chart.  Directed ROS with pertinent positives and negatives documented in the history of present illness/assessment and plan.  Exam: [name] assistant Vitals:   09/02/18 1159  BP: 122/80   General appearance:  Normal Abdomen soft nontender without masses guarding rebound Pelvic external BUS vagina with atrophic changes.  Cervix high in the vaginal vault.  No significant cystocele.  Bimanual without masses or tenderness.  Assessment/Plan:  72 y.o. [id number] with history of uterine prolapse and cystocele using ring pessary.  Exam shows vaginal mucosa has healed with the estradiol cream and in fact she is no longer noting uterine lab/cystocele symptoms.  At this point as she is asymptomatic we both agree to leave the pessary out but I would continue on the estradiol cream as I think this is affording her good vaginal support and she will follow-up in several months when she is due for her annual exam which is already scheduled.  She will follow-up sooner if there are any issues.    [name] MD, 12:15 PM 09/02/2018

## 2018-09-03 ENCOUNTER — Telehealth: Payer: Self-pay | Admitting: *Deleted

## 2018-09-03 MED ORDER — NONFORMULARY OR COMPOUNDED ITEM: 3 refills | Status: DC

## 2018-09-03 NOTE — Telephone Encounter (Signed)
-----   Message from [name], MD sent at 09/02/2018 12:20 PM EDT ----- Call a refill for the patient's estradiol vaginal prefilled syringes twice weekly at [facility] x3 months with 3 refills

## 2018-09-03 NOTE — Telephone Encounter (Signed)
Rx called in 

## 2018-09-06 ENCOUNTER — Ambulatory Visit
Admission: RE | Admit: 2018-09-06 | Discharge: 2018-09-06 | Disposition: A | Discharge status: Home / Self Care | Payer: Medicare HMO | Source: Ambulatory Visit | Attending: Internal Medicine | Admitting: Internal Medicine

## 2018-09-06 DIAGNOSIS — E042 Nontoxic multinodular goiter: Secondary | ICD-10-CM | POA: Diagnosis not present

## 2018-09-06 DIAGNOSIS — E041 Nontoxic single thyroid nodule: Secondary | ICD-10-CM

## 2018-09-25 ENCOUNTER — Inpatient Hospital Stay: Payer: Medicare HMO | Attending: Hematology and Oncology

## 2018-09-25 DIAGNOSIS — M3503 Sicca syndrome with myopathy: Secondary | ICD-10-CM

## 2018-09-25 DIAGNOSIS — D509 Iron deficiency anemia, unspecified: Secondary | ICD-10-CM | POA: Insufficient documentation

## 2018-09-25 DIAGNOSIS — E538 Deficiency of other specified B group vitamins: Secondary | ICD-10-CM | POA: Insufficient documentation

## 2018-09-25 DIAGNOSIS — M3509 Sicca syndrome with other organ involvement: Secondary | ICD-10-CM

## 2018-09-25 DIAGNOSIS — D508 Other iron deficiency anemias: Secondary | ICD-10-CM

## 2018-09-25 DIAGNOSIS — M069 Rheumatoid arthritis, unspecified: Secondary | ICD-10-CM

## 2018-09-25 DIAGNOSIS — M3320 Polymyositis, organ involvement unspecified: Secondary | ICD-10-CM

## 2018-09-25 DIAGNOSIS — G63 Polyneuropathy in diseases classified elsewhere: Secondary | ICD-10-CM

## 2018-09-25 LAB — CBC WITH DIFFERENTIAL/PLATELET
Abs Immature Granulocytes: 0.04 10*3/uL (ref 0.00–0.07)
Basophils Absolute: 0 10*3/uL (ref 0.0–0.1)
Basophils Relative: 0 %
EOS ABS: 0.2 10*3/uL (ref 0.0–0.5)
EOS PCT: 2 %
HCT: 39.6 % (ref 36.0–46.0)
Hemoglobin: 13.3 g/dL (ref 12.0–15.0)
IMMATURE GRANULOCYTES: 0 %
Lymphocytes Relative: 16 %
Lymphs Abs: 1.6 10*3/uL (ref 0.7–4.0)
MCH: 32.5 pg (ref 26.0–34.0)
MCHC: 33.6 g/dL (ref 30.0–36.0)
MCV: 96.8 fL (ref 80.0–100.0)
MONOS PCT: 10 %
Monocytes Absolute: 0.9 10*3/uL (ref 0.1–1.0)
NEUTROS PCT: 72 %
Neutro Abs: 6.8 10*3/uL (ref 1.7–7.7)
PLATELETS: 231 10*3/uL (ref 150–400)
RBC: 4.09 MIL/uL (ref 3.87–5.11)
RDW: 12.6 % (ref 11.5–15.5)
WBC: 9.5 10*3/uL (ref 4.0–10.5)
nRBC: 0 % (ref 0.0–0.2)

## 2018-09-25 LAB — COMPREHENSIVE METABOLIC PANEL
ALK PHOS: 55 U/L (ref 38–126)
ALT: 13 U/L (ref 0–44)
ANION GAP: 10 (ref 5–15)
AST: 25 U/L (ref 15–41)
Albumin: 3.7 g/dL (ref 3.5–5.0)
BILIRUBIN TOTAL: 0.6 mg/dL (ref 0.3–1.2)
BUN: 13 mg/dL (ref 8–23)
CALCIUM: 10.4 mg/dL — AB (ref 8.9–10.3)
CO2: 28 mmol/L (ref 22–32)
Chloride: 97 mmol/L — ABNORMAL LOW (ref 98–111)
Creatinine, Ser: 0.69 mg/dL (ref 0.44–1.00)
GFR calc non Af Amer: >60 mL/min (ref >60)
Glucose, Bld: 79 mg/dL (ref 70–99)
Potassium: 4.6 mmol/L (ref 3.5–5.1)
Sodium: 135 mmol/L (ref 135–145)
Total Protein: 6.9 g/dL (ref 6.5–8.1)

## 2018-09-25 LAB — RETICULOCYTES
Immature Retic Fract: 5.1 % (ref 2.3–15.9)
RBC.: 4.09 MIL/uL (ref 3.87–5.11)
RETIC COUNT ABSOLUTE: 65 10*3/uL (ref 19.0–186.0)
RETIC CT PCT: 1.6 % (ref 0.4–3.1)

## 2018-09-25 LAB — VITAMIN B12: VITAMIN B 12: 332 pg/mL (ref 180–914)

## 2018-09-25 LAB — FERRITIN: Ferritin: 177 ng/mL (ref 11–307)

## 2018-10-15 ENCOUNTER — Ambulatory Visit
Admission: RE | Admit: 2018-10-15 | Discharge: 2018-10-15 | Disposition: A | Discharge status: Home / Self Care | Payer: Medicare HMO | Source: Ambulatory Visit | Attending: Gynecology | Admitting: Gynecology

## 2018-10-15 DIAGNOSIS — Z1231 Encounter for screening mammogram for malignant neoplasm of breast: Secondary | ICD-10-CM

## 2018-10-31 ENCOUNTER — Ambulatory Visit: Payer: Medicare HMO | Admitting: Gynecology

## 2018-10-31 ENCOUNTER — Ambulatory Visit: Payer: Medicare HMO

## 2018-10-31 ENCOUNTER — Encounter: Payer: Self-pay | Admitting: Gynecology

## 2018-10-31 VITALS — BP 140/80 | Ht <= 58 in | Wt 125.0 lb

## 2018-10-31 DIAGNOSIS — Z01419 Encounter for gynecological examination (general) (routine) without abnormal findings: Secondary | ICD-10-CM

## 2018-10-31 DIAGNOSIS — N952 Postmenopausal atrophic vaginitis: Secondary | ICD-10-CM

## 2018-10-31 DIAGNOSIS — N8189 Other female genital prolapse: Secondary | ICD-10-CM

## 2018-10-31 DIAGNOSIS — M810 Age-related osteoporosis without current pathological fracture: Secondary | ICD-10-CM

## 2018-10-31 NOTE — Patient Instructions (Signed)
Follow-up in 1 year, sooner if you have issues with the prolapse.  Continue on the vaginal estrogen cream once weekly.

## 2018-10-31 NOTE — Progress Notes (Signed)
    [name] 1946-05-15 [id number]        72 y.o.  [id number] for breast and pelvic exam.  Is followed with a history of pelvic relaxation with cystocele, rectocele and uterine prolapse.  Had been using ring pessary although was having irritation from this.  Uses estradiol vaginal cream twice weekly and has left her pessary out and has done well.  Occasional bulging when she is having constipation but not bothersome to her.  Past medical history,surgical history, problem list, medications, allergies, family history and social history were all reviewed and documented as reviewed in the EPIC chart.  ROS:  Performed with pertinent positives and negatives included in the history, assessment and plan.   Additional significant findings : None   Exam: [name] assistant Vitals:   10/31/18 1132  BP: 140/80  Weight: 125 lb (56.7 kg)  Height: 4\' 10"  (1.473 m)   Body mass index is 26.13 kg/m.  General appearance:  Normal affect, orientation and appearance. Skin: Grossly normal HEENT: Without gross lesions.  No cervical or supraclavicular adenopathy. Thyroid normal.  Lungs:  Clear without wheezing, rales or rhonchi Cardiac: RR, without RMG Abdominal:  Soft, nontender, without masses, guarding, rebound, organomegaly or hernia Breasts:  Examined lying and sitting without masses, retractions, discharge or axillary adenopathy. Pelvic:  Ext, BUS, Vagina: With atrophic changes.  Mild cystocele/rectocele/uterine prolapse.  Healed from prior irritation.  Cervix: With atrophic changes  Uterus: Anteverted, normal size, shape and contour, midline and mobile nontender   Adnexa: Without masses or tenderness    Anus and perineum: Normal   Rectovaginal: Normal sphincter tone without palpated masses or tenderness.    Assessment/Plan:  72 y.o. [id number] female for breast and pelvic exam.   1. Pelvic relaxation with cystocele/uterine prolapse mild rectocele.  Doing well without her pessary.  Using  estradiol vaginal cream twice weekly through [facility].  Will decrease to once weekly and see how she does with this.  If she has any issues without her pessary she will follow-up.  Otherwise will monitor for now.  She knows to report any vaginal bleeding. 2. Mammography 09/2018.  Family history of breast cancer in her father and 2 paternal aunts.  Mother with ovarian cancer in her 66s.  We have discussed on several occasions options for management to include genetic screening increased surveillance such as breast MRIs pelvic ultrasounds, CA 125 up to and including prophylactic surgery.  Patient not interested in anything at this point.  Will continue with annual mammography. 3. Colonoscopy 2018.  Repeat at their recommended interval. 4. Osteoporosis.  Being followed elsewhere and will continue to follow-up with them in reference to bone health. 5. Pap smear 2015.  No Pap smear done today.  No history of significant abnormal Pap smears.  We discussed current screening guidelines and both agree to stop screening. 6. Health maintenance.  No routine lab work done as patient does this elsewhere.  Follow-up 1 year, sooner as needed.   [name] MD, 11:56 AM 10/31/2018

## 2018-11-27 DIAGNOSIS — M810 Age-related osteoporosis without current pathological fracture: Secondary | ICD-10-CM | POA: Diagnosis not present

## 2018-11-27 DIAGNOSIS — M549 Dorsalgia, unspecified: Secondary | ICD-10-CM | POA: Diagnosis not present

## 2018-11-27 DIAGNOSIS — M2550 Pain in unspecified joint: Secondary | ICD-10-CM | POA: Diagnosis not present

## 2018-11-27 DIAGNOSIS — M609 Myositis, unspecified: Secondary | ICD-10-CM | POA: Diagnosis not present

## 2018-11-27 DIAGNOSIS — M150 Primary generalized (osteo)arthritis: Secondary | ICD-10-CM | POA: Diagnosis not present

## 2018-11-27 DIAGNOSIS — M3500 Sicca syndrome, unspecified: Secondary | ICD-10-CM | POA: Diagnosis not present

## 2018-12-24 DIAGNOSIS — M810 Age-related osteoporosis without current pathological fracture: Secondary | ICD-10-CM | POA: Diagnosis not present

## 2018-12-24 DIAGNOSIS — B029 Zoster without complications: Secondary | ICD-10-CM | POA: Diagnosis not present

## 2019-01-08 DIAGNOSIS — H401132 Primary open-angle glaucoma, bilateral, moderate stage: Secondary | ICD-10-CM | POA: Diagnosis not present

## 2019-01-24 DIAGNOSIS — I10 Essential (primary) hypertension: Secondary | ICD-10-CM | POA: Diagnosis not present

## 2019-01-24 DIAGNOSIS — Z79899 Other long term (current) drug therapy: Secondary | ICD-10-CM | POA: Diagnosis not present

## 2019-01-24 DIAGNOSIS — M810 Age-related osteoporosis without current pathological fracture: Secondary | ICD-10-CM | POA: Diagnosis not present

## 2019-01-29 DIAGNOSIS — M3501 Sicca syndrome with keratoconjunctivitis: Secondary | ICD-10-CM | POA: Diagnosis not present

## 2019-01-29 DIAGNOSIS — Z961 Presence of intraocular lens: Secondary | ICD-10-CM | POA: Diagnosis not present

## 2019-01-30 DIAGNOSIS — R69 Illness, unspecified: Secondary | ICD-10-CM | POA: Diagnosis not present

## 2019-02-06 ENCOUNTER — Emergency Department (HOSPITAL_COMMUNITY): Payer: Medicare HMO

## 2019-02-06 ENCOUNTER — Other Ambulatory Visit: Payer: Self-pay

## 2019-02-06 ENCOUNTER — Emergency Department (HOSPITAL_COMMUNITY)
Admission: EM | Admit: 2019-02-06 | Discharge: 2019-02-06 | Disposition: A | Discharge status: Home / Self Care | Payer: Medicare HMO | Attending: Emergency Medicine | Admitting: Emergency Medicine

## 2019-02-06 ENCOUNTER — Encounter (HOSPITAL_COMMUNITY): Payer: Self-pay

## 2019-02-06 DIAGNOSIS — S3993XA Unspecified injury of pelvis, initial encounter: Secondary | ICD-10-CM | POA: Diagnosis not present

## 2019-02-06 DIAGNOSIS — Z79899 Other long term (current) drug therapy: Secondary | ICD-10-CM | POA: Insufficient documentation

## 2019-02-06 DIAGNOSIS — S0990XA Unspecified injury of head, initial encounter: Secondary | ICD-10-CM | POA: Diagnosis present

## 2019-02-06 DIAGNOSIS — Y999 Unspecified external cause status: Secondary | ICD-10-CM | POA: Diagnosis not present

## 2019-02-06 DIAGNOSIS — I10 Essential (primary) hypertension: Secondary | ICD-10-CM | POA: Insufficient documentation

## 2019-02-06 DIAGNOSIS — S79912A Unspecified injury of left hip, initial encounter: Secondary | ICD-10-CM | POA: Diagnosis not present

## 2019-02-06 DIAGNOSIS — W0110XA Fall on same level from slipping, tripping and stumbling with subsequent striking against unspecified object, initial encounter: Secondary | ICD-10-CM | POA: Insufficient documentation

## 2019-02-06 DIAGNOSIS — Y939 Activity, unspecified: Secondary | ICD-10-CM | POA: Diagnosis not present

## 2019-02-06 DIAGNOSIS — S3992XA Unspecified injury of lower back, initial encounter: Secondary | ICD-10-CM | POA: Diagnosis not present

## 2019-02-06 DIAGNOSIS — M25552 Pain in left hip: Secondary | ICD-10-CM | POA: Diagnosis not present

## 2019-02-06 DIAGNOSIS — Y929 Unspecified place or not applicable: Secondary | ICD-10-CM | POA: Diagnosis not present

## 2019-02-06 DIAGNOSIS — W010XXA Fall on same level from slipping, tripping and stumbling without subsequent striking against object, initial encounter: Secondary | ICD-10-CM

## 2019-02-06 DIAGNOSIS — S0101XA Laceration without foreign body of scalp, initial encounter: Secondary | ICD-10-CM | POA: Insufficient documentation

## 2019-02-06 MED ORDER — ACETAMINOPHEN 500 MG PO TABS
1000.0000 mg | ORAL_TABLET | Freq: Once | ORAL | Status: AC
  Administered 2019-02-06: 1000 mg via ORAL
  Filled 2019-02-06: qty 2

## 2019-02-06 MED ORDER — BACITRACIN ZINC 500 UNIT/GM EX OINT
TOPICAL_OINTMENT | Freq: Once | CUTANEOUS | Status: AC
  Administered 2019-02-06: 1 via TOPICAL
  Filled 2019-02-06: qty 1.8

## 2019-02-06 MED ORDER — LIDOCAINE-EPINEPHRINE (PF) 2 %-1:200000 IJ SOLN
20.0000 mL | Freq: Once | INTRAMUSCULAR | Status: AC
  Administered 2019-02-06: 20 mL
  Filled 2019-02-06: qty 20

## 2019-02-06 NOTE — Discharge Instructions (Addendum)
It was our pleasure to provide your ER care today - we hope that you feel better.  Keep wound very clean and dry. May shower, pad area gently dry. Have staples removed in 8-10 days.   Take acetaminophen and/or ibuprofen as need.  Return to ER if worse, new symptoms, new or severe pain, severe headache, vomiting, other concern.

## 2019-02-06 NOTE — ED Provider Notes (Signed)
[facility] DEPT Provider Note   CSN: [id number] Arrival date & time: 02/06/19  1612    History   Chief Complaint Chief Complaint  Patient presents with  . Fall  . Head Injury  . Hip Pain    HPI [name] is a 73 y.o. female.     Patient s/p fall this evening. Pt was reaching for door, hand slipped, fell landing on buttocks/lower back, and then hit head. No loc. Able to get up, ambulatory since fall. Laceration to superior aspect scalp. Denies headache. +low back pain. Mod, dull, radiates to posterior left leg. No leg numbness/weakness. Denies neck or upper back pain. Denies extremity pain or injury. Felt fine prior to fall, no faintness or dizziness. No anticoag use. No nv. Pt indicates tetanus up to date within past 5 years.   The history is provided by the patient and a relative.  Fall  Pertinent negatives include no chest pain, no abdominal pain, no headaches and no shortness of breath.  Head Injury  Associated symptoms: no headaches, no nausea, no neck pain, no numbness and no vomiting   Hip Pain  Pertinent negatives include no chest pain, no abdominal pain, no headaches and no shortness of breath.    Past Medical History:  Diagnosis Date  . AAA (abdominal aortic aneurysm) ([place])    small      report in epic 11/11 states no aaa    . Acute blood loss anemia   . Cataract    surgery on both eyes  . Chronic lower back pain    "for 6 years; relieved since back OR 07/2013" (09/08/2013)  . Difficult intubation    "ive been told im small", no problems listed with back sx in past  . Diverticulosis   . Family history of anesthesia complication    "sister has severe sleep apnea; she has alot of trouble w/anesthesia" (09/08/2013)  . Family history of breast cancer   . Family history of breast cancer in female   . Family history of lung cancer   . Family history of ovarian cancer   . Gastritis   . GERD (gastroesophageal reflux disease)   .  Glaucoma   . Hiatal hernia   . High cholesterol    "not severe; can't take the medicine" (09/08/2013)  . History of hiatal hernia    "small one"  . HTN (hypertension)   . Iron deficiency anemia    hx  . Migraines    "hurt like a migraine; don't get them like I did when I was younger" (09/08/2013)  . Multiple thyroid nodules   . OA (osteoarthritis)   . Osteoporosis   . Pancreatitis   . PHN (postherpetic neuralgia)    right side from shingles  . Polymyositis ([place])   . Raynaud phenomenon   . 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 Shingles   . Sjogren's syndrome ([place])   . Swallowing difficulty    unable to swallow whole pills  . Swelling of left lower extremity     Patient Active Problem List   Diagnosis Date Noted  . 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 Neuropathy due to Sjogren's syndrome ([place]) 03/18/2018  . Genetic testing 01/21/2018  . Family history of breast cancer in female   . Family history of breast cancer   . Family history of ovarian cancer   . Family history of lung cancer   . Family history of colon cancer 08/30/2017  . Lumbar radiculopathy 06/09/2016  .  Chronic low back pain   . Failed back syndrome   . Leukocytosis   . Polymyositis ([place])   . Rheumatoid arthritis ([place])   . Sjogren's disease ([place])   . Surgery, elective   . Atypical chest pain   . Back pain 06/02/2016  . 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 Dysphagia   . Swelling of left lower extremity 10/15/2013  . Essential hypertension 10/15/2013  . UTI (lower urinary tract infection) 09/08/2013  . Elevated lipase 09/08/2013  . Weakness 09/08/2013  . Dehydration 09/08/2013  . S/P lumbar spinal fusion 09/08/2013  . Fever 09/08/2013  . Nausea 09/08/2013  . Headache 09/08/2013  . Hypokalemia 09/08/2013  . Hyponatremia 09/08/2013  . Anemia due to vitamin B12 deficiency 09/08/2013  . Pancreatitis 09/08/2013  . Difficulty swallowing pills 09/08/2013  . Iron deficiency anemia  03/14/2013  . Uterine prolapse 04/16/2012  . Cystocele 04/16/2012  . Rectocele 04/16/2012  . Osteopenia   . IRON DEFICIENCY 01/12/2010  . 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 DYSPHAGIA UNSPECIFIED 01/12/2010    Past Surgical History:  Procedure Laterality Date  . ABDOMINAL EXPOSURE N/A 06/02/2016   Procedure: ABDOMINAL EXPOSURE;  Surgeon: Rosetta Posner, MD;  Location: [facility];  Service: Vascular;  Laterality: N/A;  . ANTERIOR LAT LUMBAR FUSION  06/02/2016   Procedure: Thoracic twelve-Lumbar one, Lumbar one-two anterior Lateral Interbody Fusion;  Surgeon: Erline Levine, MD;  Location: [facility] NEUR[facility] ORS;  Service: Neurosurgery;;  . ANTERIOR LUMBAR FUSION N/A 06/02/2016   Procedure: Lumbar five-sacral one Anterior lumbar interbody fusion with Dr. Donnetta Hutching for approach;  Surgeon: Erline Levine, MD;  Location: [facility] NEURO ORS;  Service: Neurosurgery;  Laterality: N/A;  . APPLICATION OF INTRAOPERATIVE CT SCAN N/A 06/02/2016   Procedure: APPLICATION OF INTRAOPERATIVE CT SCAN;  Surgeon: Erline Levine, MD;  Location: [facility] NEURO ORS;  Service: Neurosurgery;  Laterality: N/A;  . CATARACT EXTRACTION W/ INTRAOCULAR LENS  IMPLANT, BILATERAL Bilateral 12/2010-01/2011  . DENTAL SURGERY  11/2011-12/2011   one on each side in my bottom jaw; dental implants" (09/08/2013)  . ESOPHAGEAL DILATION     "a few times before 02/2010; not since" (09/08/2013)  . ESOPHAGEAL MANOMETRY N/A 02/25/2016   Procedure: ESOPHAGEAL MANOMETRY (EM);  Surgeon: Manus Gunning, MD;  Location: [facility];  Service: Gastroenterology;  Laterality: N/A;  . ESOPHAGOGASTRODUODENOSCOPY    . LUMBAR FUSION  Aug 14, 2013   Dr. Consuello Masse  . POSTERIOR LUMBAR FUSION 4 LEVEL Bilateral 06/02/2016   Procedure: Exploration of Fusion Lumbar two-five, Thoracic ten-ileum posterior fusion, Posterolateral arthrodesis with AIRO;  Surgeon: Erline Levine, MD;  Location: [facility] NEURO ORS;  Service: Neurosurgery;  Laterality: Bilateral;  . REPAIR ZENKER'S DIVERTICULA   02/2010   "diverticulectomy w/zenker's pouch correction" (09/08/2013)     OB History    Gravida  4   Para  4   Term  4   Preterm      AB      Living  4     SAB      TAB      Ectopic      Multiple      Live Births               Home Medications    Prior to Admission medications   Medication Sig Start Date End Date Taking? Authorizing Provider  acetaminophen (TYLENOL) 500 MG tablet Take 500 mg by mouth every 6 (six) hours as needed for moderate pain.    Yes [provider]  Ascorbic Acid (VITAMIN C PO) Take 250 mg by mouth daily.    Yes [provider]  aspirin EC 81 MG tablet Take 81 mg by mouth daily as needed for moderate pain.   Yes [provider]  Aspirin-Salicylamide-Caffeine (BC HEADACHE PO) Take 1 packet by mouth daily as needed (pain).   Yes [provider]  azaTHIOprine (IMURAN) 50 MG tablet Take 50 mg by mouth daily.  01/27/19  Yes [provider]  hydroxychloroquine (PLAQUENIL) 200 MG tablet Take 200 mg by mouth daily.   Yes [provider]  losartan (COZAAR) 50 MG tablet Take 50 mg by mouth daily.  01/27/19  Yes [provider]  Magnesium 250 MG TABS Take 250 mg by mouth 2 (two) times daily.   Yes [provider]  Multiple Vitamin (MULTIVITAMIN) tablet Take 1 tablet by mouth daily.   Yes [provider]  NONFORMULARY OR COMPOUNDED ITEM Estradiol vaginal cream 1.96% once applicator twice weekly 09/03/18  Yes Fontaine, Belinda Block, MD  omeprazole (PRILOSEC) 20 MG capsule Take 20 mg by mouth daily as needed (indigestion).    Yes [provider]  predniSONE (DELTASONE) 5 MG tablet Take 1 tablet (5 mg total) by mouth daily with breakfast. Patient taking differently: Take 10 mg by mouth daily with breakfast.  06/22/16  Yes Angiulli, Lavon Paganini, PA-C  timolol (BETIMOL) 0.5 % ophthalmic solution Place 1 drop into both eyes at bedtime.   Yes [provider]  traMADol (ULTRAM)  50 MG tablet Take 1 tablet (50 mg total) by mouth 4 (four) times daily - after meals and at bedtime. Patient taking differently: Take 50 mg by mouth every 6 (six) hours as needed for moderate pain or severe pain.  06/22/16  Yes Angiulli, Lavon Paganini, PA-C  Triamterene-HCTZ (MAXZIDE-25 PO) Take 1 tablet by mouth daily as needed (fluid).   Yes [provider]  AMBULATORY NON FORMULARY MEDICATION 1 Units by Other route daily. Left AFO 03/18/18   Narda Amber K, DO  aspirin 81 MG chewable tablet Chew 1 tablet (81 mg total) by mouth daily. Patient not taking: Reported on 02/06/2019 06/22/16   Angiulli, Lavon Paganini, PA-C  Tetrahydrozoline HCl (VISINE OP) Place 1 drop into both eyes daily as needed (dry eyes). Reported on 05/16/2016    [provider]    Family History Family History  Problem Relation Age of Onset  . Ovarian cancer Mother 92  . Hypertension Father   . Heart disease Father   . Breast cancer Father 14       lumpectomy and tamoxifen  . Colon cancer Father 30  . Psoriasis Sister   . Sarcoidosis Sister   . Arthritis Sister        psoriatic arthritis  . Diabetes Sister   . Arthritis Maternal Uncle        rheumatiod arthritis  . Cancer Maternal Uncle 65       in back- unsure if a bone cancer?  . Diabetes Son   . Heart disease Brother   . Breast cancer Paternal Aunt 85       lumpect and radiaion  . Breast cancer Paternal Aunt 44       lumpect and radiaion  . Lung cancer Paternal Uncle   . Heart disease Maternal Grandfather        hx smoking, died 'younger'  . Heart disease Paternal Grandfather 3  . Breast cancer Paternal Aunt        post menopause  Social History Social History   Tobacco Use  . Smoking status: Never Smoker  . Smokeless tobacco: Never Used  Substance Use Topics  . Alcohol use: No    Alcohol/week: 0.0 standard drinks  . Drug use: No     Allergies   Macrodantin [nitrofurantoin macrocrystal]; Phenylephrine; and Lisinopril   Review of  Systems Review of Systems  Constitutional: Negative for fever.  HENT: Negative for nosebleeds.   Eyes: Negative for visual disturbance.  Respiratory: Negative for shortness of breath.   Cardiovascular: Negative for chest pain.  Gastrointestinal: Negative for abdominal pain, nausea and vomiting.  Genitourinary: Negative for flank pain.  Musculoskeletal: Negative for neck pain.  Skin: Positive for wound.  Neurological: Negative for weakness, numbness and headaches.  Hematological: Does not bruise/bleed easily.  Psychiatric/Behavioral: Negative for confusion.     Physical Exam Updated Vital Signs BP (!) 164/80 (BP Location: Left Arm)   Pulse 87   Temp 98 F (36.7 C) (Oral)   Resp 16   Ht 1.473 m (4\' 10" )   Wt 55.8 kg   SpO2 99%   BMI 25.71 kg/m   Physical Exam Vitals signs and nursing note reviewed.  Constitutional:      Appearance: Normal appearance. She is well-developed.  HENT:     Head:     Comments: 4 cm laceration to superior scalp.     Nose: Nose normal.     Mouth/Throat:     Mouth: Mucous membranes are moist.  Eyes:     General: No scleral icterus.    Conjunctiva/sclera: Conjunctivae normal.     Pupils: Pupils are equal, round, and reactive to light.  Neck:     Musculoskeletal: Normal range of motion and neck supple. No neck rigidity or muscular tenderness.     Trachea: No tracheal deviation.  Cardiovascular:     Rate and Rhythm: Normal rate and regular rhythm.     Pulses: Normal pulses.     Heart sounds: Normal heart sounds. No murmur. No friction rub. No gallop.   Pulmonary:     Effort: Pulmonary effort is normal. No respiratory distress.     Breath sounds: Normal breath sounds.  Chest:     Chest wall: No tenderness.  Abdominal:     General: Bowel sounds are normal. There is no distension.     Palpations: Abdomen is soft.     Tenderness: There is no abdominal tenderness. There is no guarding.  Genitourinary:    Comments: No cva tenderness.    Musculoskeletal:        General: No swelling or tenderness.     Right lower leg: No edema.     Left lower leg: No edema.     Comments: Lower lumbar tenderness, otherwise, CTLS spine, non tender, aligned, no step off. Good rom bil extremities without pain or focal bony tenderness.   Skin:    General: Skin is warm and dry.     Findings: No rash.  Neurological:     Mental Status: She is alert.     Comments: Alert, speech normal. Motor intact bil, stre 5/5. sens grossly intact bil.   Psychiatric:        Mood and Affect: Mood normal.      ED Treatments / Results  Labs (all labs ordered are listed, but only abnormal results are displayed) Labs Reviewed - No data to display  EKG None  Radiology Dg Lumbar Spine Complete  Result Date: 02/06/2019 CLINICAL DATA:  Status post fall with  left hip pain. EXAM: LUMBAR SPINE - COMPLETE 4+ VIEW COMPARISON:  05/30/2017 FINDINGS: There is no evidence of lumbar spine fracture. Levoconvex scoliosis post posterior fusion of the thoraco lumbo-sacral spine. No evidence of fracture or loosening of the metallic hardware. IMPRESSION: 1. No acute fracture or dislocation identified about the lumbosacral spine. 2. Posterior fusion of the thoraco lumbo-sacral spine. Electronically Signed   By: Fidela Salisbury M.D.   On: 02/06/2019 17:54   Dg Pelvis 1-2 Views  Result Date: 02/06/2019 CLINICAL DATA:  Left hip pain post fall. EXAM: PELVIS - 1-2 VIEW COMPARISON:  None. FINDINGS: There is no evidence of pelvic fracture or diastasis. No pelvic bone lesions are seen. Partially visualized lumbosacral fusion. Constipation. IMPRESSION: No acute osseous injury of the pelvis. Electronically Signed   By: Fidela Salisbury M.D.   On: 02/06/2019 17:52   Dg Hip Unilat W Or W/o Pelvis 2-3 Views Left  Result Date: 02/06/2019 CLINICAL DATA:  Patient lost her grip on a door knob and fell on her back hind. Left hip and pelvic pain. EXAM: DG HIP (WITH OR WITHOUT PELVIS) 2-3V  LEFT COMPARISON:  None. FINDINGS: Patient is status post multilevel posterior and single level anterior lumbar fusion with hardware in place. Lucency about the left caudal most screw embedded within the iliac bone is identified suspicious for loosening. Mild degenerative joint space narrowing of the left hip is identified without acute fracture or joint dislocation. Overlying bowel limits assessment of the lower sacrum and coccyx and pubic rami. IMPRESSION: 1. No acute fracture or joint dislocation. 2. Status post anterior and posterior lumbar spinal fusion. Lucency about the left caudal most screw embedded within the iliac bone is suspicious for loosening of the screw. Electronically Signed   By: Ashley Royalty M.D.   On: 02/06/2019 19:00    Procedures .Marland KitchenLaceration Repair Date/Time: 02/06/2019 6:35 PM Performed by: Lajean Saver, MD Authorized by: Lajean Saver, MD   Consent:    Consent given by:  Patient Anesthesia (see MAR for exact dosages):    Anesthesia method:  Local infiltration   Local anesthetic:  Lidocaine 2% WITH epi Laceration details:    Location:  Scalp   Scalp location:  Crown   Length (cm):  4 Repair type:    Repair type:  Simple Pre-procedure details:    Preparation:  Patient was prepped and draped in usual sterile fashion Exploration:    Wound extent: no foreign bodies/material noted   Treatment:    Area cleansed with:  Betadine   Irrigation solution:  Sterile saline   Irrigation method:  Syringe   Visualized foreign bodies/material removed: no   Skin repair:    Repair method:  Staples   Number of staples:  8 Approximation:    Approximation:  Close Post-procedure details:    Dressing:  Antibiotic ointment   Patient tolerance of procedure:  Tolerated well, no immediate complications   (including critical care time)  Medications Ordered in ED Medications  lidocaine-EPINEPHrine (XYLOCAINE W/EPI) 2 %-1:200000 (PF) injection 20 mL (has no administration in time  range)     Initial Impression / Assessment and Plan / ED Course  I have reviewed the triage vital signs and the nursing notes.  Pertinent labs & imaging results that were available during my care of the patient were reviewed by me and considered in my medical decision making (see chart for details).  Imaging ordered.   Wound cleaned, sutured.   Reviewed nursing notes and prior charts for additional history.  xrays reviewed - no fx.   Acetaminophen po.  Recheck CTLS spine, non tender, aligned, no step off. Good rom LLE without pain.  No headache. No neck pain or tenderness.     Final Clinical Impressions(s) / ED Diagnoses   Final diagnoses:  None    ED Discharge Orders    None       Lajean Saver, MD 02/07/19 1422

## 2019-02-06 NOTE — ED Notes (Signed)
Patient transported to X-ray 

## 2019-02-06 NOTE — ED Triage Notes (Signed)
Patient states she was closing a door and her hand slipped from the door knob and states she fell on her behind then once seated fell backwards,hitting her head on the baseboard. Patient has a laceration to the head and patient c/o left hip pain.

## 2019-02-13 DIAGNOSIS — M810 Age-related osteoporosis without current pathological fracture: Secondary | ICD-10-CM | POA: Diagnosis not present

## 2019-02-19 DIAGNOSIS — R509 Fever, unspecified: Secondary | ICD-10-CM | POA: Diagnosis not present

## 2019-02-19 DIAGNOSIS — R6889 Other general symptoms and signs: Secondary | ICD-10-CM | POA: Diagnosis not present

## 2019-02-19 DIAGNOSIS — Z4802 Encounter for removal of sutures: Secondary | ICD-10-CM | POA: Diagnosis not present

## 2019-02-19 DIAGNOSIS — I69323 Fluency disorder following cerebral infarction: Secondary | ICD-10-CM | POA: Diagnosis not present

## 2019-02-19 DIAGNOSIS — J029 Acute pharyngitis, unspecified: Secondary | ICD-10-CM | POA: Diagnosis not present

## 2019-02-19 DIAGNOSIS — R05 Cough: Secondary | ICD-10-CM | POA: Diagnosis not present

## 2019-02-26 DIAGNOSIS — R6889 Other general symptoms and signs: Secondary | ICD-10-CM | POA: Diagnosis not present

## 2019-02-26 DIAGNOSIS — R509 Fever, unspecified: Secondary | ICD-10-CM | POA: Diagnosis not present

## 2019-02-26 DIAGNOSIS — M150 Primary generalized (osteo)arthritis: Secondary | ICD-10-CM | POA: Diagnosis not present

## 2019-02-27 DIAGNOSIS — M609 Myositis, unspecified: Secondary | ICD-10-CM | POA: Diagnosis not present

## 2019-02-27 DIAGNOSIS — M2550 Pain in unspecified joint: Secondary | ICD-10-CM | POA: Diagnosis not present

## 2019-02-27 DIAGNOSIS — M549 Dorsalgia, unspecified: Secondary | ICD-10-CM | POA: Diagnosis not present

## 2019-02-27 DIAGNOSIS — R600 Localized edema: Secondary | ICD-10-CM | POA: Diagnosis not present

## 2019-02-27 DIAGNOSIS — M810 Age-related osteoporosis without current pathological fracture: Secondary | ICD-10-CM | POA: Diagnosis not present

## 2019-02-27 DIAGNOSIS — M3500 Sicca syndrome, unspecified: Secondary | ICD-10-CM | POA: Diagnosis not present

## 2019-02-27 DIAGNOSIS — I10 Essential (primary) hypertension: Secondary | ICD-10-CM | POA: Diagnosis not present

## 2019-02-27 DIAGNOSIS — M150 Primary generalized (osteo)arthritis: Secondary | ICD-10-CM | POA: Diagnosis not present

## 2019-03-27 ENCOUNTER — Telehealth: Payer: Self-pay | Admitting: Oncology

## 2019-03-27 NOTE — Telephone Encounter (Signed)
R/s appt per 4/9 sch message per patient request - pt aware of new appt date and time

## 2019-03-31 ENCOUNTER — Other Ambulatory Visit: Payer: Medicare HMO

## 2019-04-07 ENCOUNTER — Ambulatory Visit: Payer: Medicare HMO | Admitting: Oncology

## 2019-05-20 ENCOUNTER — Other Ambulatory Visit: Payer: Medicare HMO

## 2019-05-27 ENCOUNTER — Ambulatory Visit: Payer: Medicare HMO | Admitting: Oncology

## 2019-07-22 ENCOUNTER — Inpatient Hospital Stay: Payer: Medicare HMO | Attending: Oncology

## 2019-07-22 ENCOUNTER — Other Ambulatory Visit: Payer: Self-pay

## 2019-07-22 DIAGNOSIS — M3503 Sicca syndrome with myopathy: Secondary | ICD-10-CM

## 2019-07-22 DIAGNOSIS — Z8041 Family history of malignant neoplasm of ovary: Secondary | ICD-10-CM | POA: Insufficient documentation

## 2019-07-22 DIAGNOSIS — Z803 Family history of malignant neoplasm of breast: Secondary | ICD-10-CM | POA: Insufficient documentation

## 2019-07-22 DIAGNOSIS — Z801 Family history of malignant neoplasm of trachea, bronchus and lung: Secondary | ICD-10-CM | POA: Insufficient documentation

## 2019-07-22 DIAGNOSIS — Z7982 Long term (current) use of aspirin: Secondary | ICD-10-CM | POA: Diagnosis not present

## 2019-07-22 DIAGNOSIS — Z79899 Other long term (current) drug therapy: Secondary | ICD-10-CM | POA: Diagnosis not present

## 2019-07-22 DIAGNOSIS — I10 Essential (primary) hypertension: Secondary | ICD-10-CM | POA: Diagnosis not present

## 2019-07-22 DIAGNOSIS — D508 Other iron deficiency anemias: Secondary | ICD-10-CM | POA: Insufficient documentation

## 2019-07-22 DIAGNOSIS — G63 Polyneuropathy in diseases classified elsewhere: Secondary | ICD-10-CM

## 2019-07-22 DIAGNOSIS — M069 Rheumatoid arthritis, unspecified: Secondary | ICD-10-CM

## 2019-07-22 DIAGNOSIS — M3320 Polymyositis, organ involvement unspecified: Secondary | ICD-10-CM

## 2019-07-22 LAB — COMPREHENSIVE METABOLIC PANEL
ALT: 13 U/L (ref 0–44)
AST: 26 U/L (ref 15–41)
Albumin: 3.7 g/dL (ref 3.5–5.0)
Alkaline Phosphatase: 50 U/L (ref 38–126)
Anion gap: 7 (ref 5–15)
BUN: 17 mg/dL (ref 8–23)
CO2: 26 mmol/L (ref 22–32)
Calcium: 9.2 mg/dL (ref 8.9–10.3)
Chloride: 96 mmol/L — ABNORMAL LOW (ref 98–111)
Creatinine, Ser: 0.75 mg/dL (ref 0.44–1.00)
GFR calc Af Amer: >60 mL/min (ref >60)
GFR calc non Af Amer: >60 mL/min (ref >60)
Glucose, Bld: 92 mg/dL (ref 70–99)
Potassium: 5.1 mmol/L (ref 3.5–5.1)
Sodium: 129 mmol/L — ABNORMAL LOW (ref 135–145)
Total Bilirubin: 0.4 mg/dL (ref 0.3–1.2)
Total Protein: 6.4 g/dL — ABNORMAL LOW (ref 6.5–8.1)

## 2019-07-22 LAB — CBC WITH DIFFERENTIAL/PLATELET
Abs Immature Granulocytes: 0.03 10*3/uL (ref 0.00–0.07)
Basophils Absolute: 0 10*3/uL (ref 0.0–0.1)
Basophils Relative: 0 %
Eosinophils Absolute: 0 10*3/uL (ref 0.0–0.5)
Eosinophils Relative: 0 %
HCT: 33.4 % — ABNORMAL LOW (ref 36.0–46.0)
Hemoglobin: 11.4 g/dL — ABNORMAL LOW (ref 12.0–15.0)
Immature Granulocytes: 1 %
Lymphocytes Relative: 15 %
Lymphs Abs: 0.9 10*3/uL (ref 0.7–4.0)
MCH: 33.8 pg (ref 26.0–34.0)
MCHC: 34.1 g/dL (ref 30.0–36.0)
MCV: 99.1 fL (ref 80.0–100.0)
Monocytes Absolute: 0.4 10*3/uL (ref 0.1–1.0)
Monocytes Relative: 7 %
Neutro Abs: 4.4 10*3/uL (ref 1.7–7.7)
Neutrophils Relative %: 77 %
Platelets: 240 10*3/uL (ref 150–400)
RBC: 3.37 MIL/uL — ABNORMAL LOW (ref 3.87–5.11)
RDW: 13 % (ref 11.5–15.5)
WBC: 5.7 10*3/uL (ref 4.0–10.5)
nRBC: 0 % (ref 0.0–0.2)

## 2019-07-23 ENCOUNTER — Other Ambulatory Visit: Payer: Self-pay | Admitting: Oncology

## 2019-07-24 DIAGNOSIS — M150 Primary generalized (osteo)arthritis: Secondary | ICD-10-CM | POA: Diagnosis not present

## 2019-07-24 DIAGNOSIS — M810 Age-related osteoporosis without current pathological fracture: Secondary | ICD-10-CM | POA: Diagnosis not present

## 2019-07-24 DIAGNOSIS — M2550 Pain in unspecified joint: Secondary | ICD-10-CM | POA: Diagnosis not present

## 2019-07-24 DIAGNOSIS — R600 Localized edema: Secondary | ICD-10-CM | POA: Diagnosis not present

## 2019-07-24 DIAGNOSIS — N390 Urinary tract infection, site not specified: Secondary | ICD-10-CM | POA: Diagnosis not present

## 2019-07-24 DIAGNOSIS — G7241 Inclusion body myositis [IBM]: Secondary | ICD-10-CM | POA: Diagnosis not present

## 2019-07-24 DIAGNOSIS — R300 Dysuria: Secondary | ICD-10-CM | POA: Diagnosis not present

## 2019-07-24 DIAGNOSIS — M3500 Sicca syndrome, unspecified: Secondary | ICD-10-CM | POA: Diagnosis not present

## 2019-07-24 DIAGNOSIS — M609 Myositis, unspecified: Secondary | ICD-10-CM | POA: Diagnosis not present

## 2019-07-24 DIAGNOSIS — M549 Dorsalgia, unspecified: Secondary | ICD-10-CM | POA: Diagnosis not present

## 2019-07-29 ENCOUNTER — Ambulatory Visit: Payer: Medicare HMO | Admitting: Oncology

## 2019-07-29 ENCOUNTER — Inpatient Hospital Stay (HOSPITAL_BASED_OUTPATIENT_CLINIC_OR_DEPARTMENT_OTHER): Payer: Medicare HMO | Admitting: Oncology

## 2019-07-29 DIAGNOSIS — D508 Other iron deficiency anemias: Secondary | ICD-10-CM | POA: Diagnosis not present

## 2019-07-29 DIAGNOSIS — G7241 Inclusion body myositis [IBM]: Secondary | ICD-10-CM

## 2019-07-29 NOTE — Progress Notes (Signed)
[place]  [phone] [phone]     ID: [name] DOB: [birth year]  MR#: [id number]  OVF#:[id number]  Patient Care Team: Deland Pretty, MD as PCP - General (Internal Medicine) Magrinat, Virgie Dad, MD as Consulting Physician (Oncology) Lahoma Rocker, MD as Consulting Physician (Rheumatology) Phineas Real, Belinda Block, MD as Consulting Physician (Gynecology) Bond, Tracie Harrier, MD as Consulting Physician (Ophthalmology) OTHER MD:  I connected with Lockie Pares on 07/29/19 at  3:00 PM EDT by telephone visit and verified that I am speaking with the correct person using two identifiers.   I discussed the limitations, risks, security and privacy concerns of performing an evaluation and management service by telemedicine and the availability of in-person appointments. I also discussed with the patient that there may be a patient responsible charge related to this service. The patient expressed understanding and agreed to proceed.   Other persons participating in the visit and their role in the encounter: Wilburn Mylar, scribe   Patient's location: home  Provider's location: [place]: Iron deficiency anemia in the setting of chronic inflammation   CURRENT TREATMENT: Observation   INTERVAL HISTORY: [name] was contacted today for follow up of her anemia.   REVIEW OF SYSTEMS: Aarohi reports she has been feeling better since January/February. She also reports her rheumatologist recently diagnosed her with [employer].  She is currently on Imuran for this and tolerates it well.  She states she has been staying home except for going to church and doctor appointments. Her son usually attends her appointments because her issues walking (she uses a rolling walker). She walks around her house and does housework. For groceries, she orders home delivery. A detailed review of systems was otherwise stable.   HISTORY OF  CURRENT ILLNESS: From the original intake note:   Ms. Hightower has a long history of iron deficiency anemia dating back to early 2009.  She was seen by my former partner Dr. Ralene Ok and a thorough evaluation including GI work-up failed to demonstrate a clear etiology.  She was found to have a history of Zenker's diverticulum and esophageal stricture.  There was no evidence of celiac disease.  Her anemia and her iron deficiency did respond to oral supplementation.  The patient has significant comorbidities including polymyositis, Sjogren's syndrome and Raynaud's phenomenon.  In October 2018 she was evaluated by another 1 of my partners, Dr. Alen Blew.  The patient received Feraheme 10/10/2017 and 10/17/2017.  She was then transferred to the care of our locum's, Dr. Lebron Conners, as of January 2019.  He noted after the Feraheme the anemia had resolved, and the MCV had normalized, and the ferritin had significantly improved.  He suggested monitoring to keep the ferritin between 50 and to 110 in this patient with a chronic inflammatory state.  At the 04/21/2018 visit he also noted subclinical B12 deficiency.  He recommended the start of oral B12 supplementation.  The patient's subsequent history is as detailed below   PAST MEDICAL HISTORY: Past Medical History:  Diagnosis Date  . AAA (abdominal aortic aneurysm) ([place])    small      report in epic 11/11 states no aaa    . Acute blood loss anemia   . Cataract    surgery on both eyes  . Chronic lower back pain    "for 6 years; relieved since back OR 07/2013" (09/08/2013)  . Difficult intubation    "ive been told im small", no problems  listed with back sx in past  . Diverticulosis   . Family history of anesthesia complication    "sister has severe sleep apnea; she has alot of trouble w/anesthesia" (09/08/2013)  . Family history of breast cancer   . Family history of breast cancer in female   . Family history of lung cancer   . Family history of ovarian  cancer   . Gastritis   . GERD (gastroesophageal reflux disease)   . Glaucoma   . Hiatal hernia   . High cholesterol    "not severe; can't take the medicine" (09/08/2013)  . History of hiatal hernia    "small one"  . HTN (hypertension)   . Iron deficiency anemia    hx  . Migraines    "hurt like a migraine; don't get them like I did when I was younger" (09/08/2013)  . Multiple thyroid nodules   . OA (osteoarthritis)   . Osteoporosis   . Pancreatitis   . PHN (postherpetic neuralgia)    right side from shingles  . Polymyositis ([place])   . Raynaud phenomenon   . 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 Shingles   . Sjogren's syndrome ([facility] [place])   . Swallowing difficulty    unable to swallow whole pills  . Swelling of left lower extremity     PAST SURGICAL HISTORY: Past Surgical History:  Procedure Laterality Date  . ABDOMINAL EXPOSURE N/A 06/02/2016   Procedure: ABDOMINAL EXPOSURE;  Surgeon: Rosetta Posner, MD;  Location: [facility];  Service: Vascular;  Laterality: N/A;  . ANTERIOR LAT LUMBAR FUSION  06/02/2016   Procedure: Thoracic twelve-Lumbar one, Lumbar one-two anterior Lateral Interbody Fusion;  Surgeon: Erline Levine, MD;  Location: [facility];  Service: Neurosurgery;;  . ANTERIOR LUMBAR FUSION N/A 06/02/2016   Procedure: Lumbar five-sacral one Anterior lumbar interbody fusion with Dr. Donnetta Hutching for approach;  Surgeon: Erline Levine, MD;  Location: [facility]UR[facility] OR[facility];  Service: Neurosurgery;  Laterality: N/A;  . APPLICATION OF INTRAOPERATIVE CT SCAN N/A 06/02/2016   Procedure: APPLICATION OF INTRAOPERATIVE CT SCAN;  Surgeon: Erline Levine, MD;  Location: [facility]UR[facility] OR[facility];  Service: Neurosurgery;  Laterality: N/A;  . CATARACT EXTRACTION W/ INTRAOCULAR LENS  IMPLANT, BILATERAL Bilateral 12/2010-01/2011  . DENTAL SURGERY  11/2011-12/2011   one on each side in my bottom jaw; dental implants" (09/08/2013)  . ESOPHAGEAL DILATION     "a few times before 02/2010; not since" (09/08/2013)  . ESOPHAGEAL MANOMETRY N/A  02/25/2016   Procedure: ESOPHAGEAL MANOMETRY (EM);  Surgeon: Manus Gunning, MD;  Location: [facility];  Service: Gastroenterology;  Laterality: N/A;  . ESOPHAGOGASTRODUODENOSCOPY    . LUMBAR FUSION  Aug 14, 2013   Dr. Consuello Masse  . POSTERIOR LUMBAR FUSION 4 LEVEL Bilateral 06/02/2016   Procedure: Exploration of Fusion Lumbar two-five, Thoracic ten-ileum posterior fusion, Posterolateral arthrodesis with AIRO;  Surgeon: Erline Levine, MD;  Location: [facility] OR[facility];  Service: Neurosurgery;  Laterality: Bilateral;  . REPAIR ZENKER'S DIVERTICULA  02/2010   "diverticulectomy w/zenker's pouch correction" (09/08/2013)    FAMILY HISTORY Family History  Problem Relation Age of Onset  . Ovarian cancer Mother 49  . Hypertension Father   . Heart disease Father   . Breast cancer Father 69       lumpectomy and tamoxifen  . Colon cancer Father 22  . Psoriasis Sister   . Sarcoidosis Sister   . Arthritis Sister        psoriatic arthritis  . Diabetes Sister   . Arthritis Maternal  Uncle        rheumatiod arthritis  . Cancer Maternal Uncle 65       in back- unsure if a bone cancer?  . Diabetes Son   . Heart disease Brother   . Breast cancer Paternal Aunt 46       lumpect and radiaion  . Breast cancer Paternal Aunt 7       lumpect and radiaion  . Lung cancer Paternal Uncle   . Heart disease Maternal Grandfather        hx smoking, died 'younger'  . Heart disease Paternal Grandfather 22  . Breast cancer Paternal Aunt        post menopause    GYNECOLOGIC HISTORY:  No LMP recorded. Patient is postmenopausal. Menarche: 73 years old Age at first live birth: 73 years old [place] P 4 LMP perimenopausal at 17, last period at 32 HRT no    SOCIAL HISTORY:  Chele is now retired. 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 She lives by herself, no pets. Her husband passed away about 4 years ago with esophageal neuroendocrine cancer. She has four sons who all live in [place] and work at [employer]. Merry Proud was born in [birth year], Morenci in [place], Legrand Como in [place] and [name] in 1975. She has 7 grandchildren and 1 great grandchild. She attends a [employer].    ADVANCED DIRECTIVES: [name] is listed first as her power of attorney and Legrand Como is listed second    HEALTH MAINTENANCE: Social History   Tobacco Use  . Smoking status: Never Smoker  . Smokeless tobacco: Never Used  Substance Use Topics  . Alcohol use: No    Alcohol/week: 0.0 standard drinks  . Drug use: No     Colonoscopy: 09/26/2017  PAP: Normal, 06/22/2014  Bone density: Osteoporosis   Allergies  Allergen Reactions  . Macrodantin [Nitrofurantoin Macrocrystal]     Liquid form--Can't tolerate  . Phenylephrine     Other reaction(s): Other (See Comments) -- phenylephrine eye drops only  Eye irritation, severe photophobia  . Lisinopril Cough    Current Outpatient Medications  Medication Sig Dispense Refill  . acetaminophen (TYLENOL) 500 MG tablet Take 500 mg by mouth every 6 (six) hours as needed for moderate pain.     Marland Kitchen AMBULATORY NON FORMULARY MEDICATION 1 Units by Other route daily. Left AFO 1 Units 0  . Ascorbic Acid (VITAMIN C PO) Take 250 mg by mouth daily.     Marland Kitchen aspirin 81 MG chewable tablet Chew 1 tablet (81 mg total) by mouth daily. (Patient not taking: Reported on 02/06/2019)    . aspirin EC 81 MG tablet Take 81 mg by mouth daily as needed for moderate pain.    . Aspirin-Salicylamide-Caffeine (BC HEADACHE PO) Take 1 packet by mouth daily as needed (pain).    Marland Kitchen azaTHIOprine (IMURAN) 50 MG tablet Take 50 mg by mouth daily.     . hydroxychloroquine (PLAQUENIL) 200 MG tablet Take 200 mg by mouth daily.    Marland Kitchen losartan (COZAAR) 50 MG tablet Take 50 mg by mouth daily.     . Magnesium 250 MG TABS Take 250 mg by mouth 2 (two) times daily.    . Multiple Vitamin (MULTIVITAMIN) tablet Take 1 tablet by mouth daily.    . NONFORMULARY OR COMPOUNDED ITEM Estradiol vaginal cream 0.86% once applicator  twice weekly 90 each 3  . omeprazole (PRILOSEC) 20 MG capsule Take 20 mg by mouth daily as needed (indigestion).     Marland Kitchen  predniSONE (DELTASONE) 5 MG tablet Take 1 tablet (5 mg total) by mouth daily with breakfast. (Patient taking differently: Take 10 mg by mouth daily with breakfast. ) 30 tablet 0  . Tetrahydrozoline HCl (VISINE OP) Place 1 drop into both eyes daily as needed (dry eyes). Reported on 05/16/2016    . timolol (BETIMOL) 0.5 % ophthalmic solution Place 1 drop into both eyes at bedtime.    . traMADol (ULTRAM) 50 MG tablet Take 1 tablet (50 mg total) by mouth 4 (four) times daily - after meals and at bedtime. (Patient taking differently: Take 50 mg by mouth every 6 (six) hours as needed for moderate pain or severe pain. ) 30 tablet 0  . Triamterene-HCTZ (MAXZIDE-25 PO) Take 1 tablet by mouth daily as needed (fluid).     No current facility-administered medications for this visit.     OBJECTIVE: Middle-aged white woman who appears frail  There were no vitals filed for this visit.   There is no height or weight on file to calculate BMI.   Wt Readings from Last 3 Encounters:  02/06/19 123 lb (55.8 kg)  10/31/18 125 lb (56.7 kg)  06/24/18 125 lb (56.7 kg)      ECOG FS:2 - Symptomatic, <50% confined to bed   LAB RESULTS:  CMP     Component Value Date/Time   NA 129 (L) 07/22/2019 1119   NA 136 10/03/2017 1128   K 5.1 07/22/2019 1119   K 4.1 10/03/2017 1128   CL 96 (L) 07/22/2019 1119   CL 102 03/14/2013 1039   CO2 26 07/22/2019 1119   CO2 27 10/03/2017 1128   GLUCOSE 92 07/22/2019 1119   GLUCOSE 80 10/03/2017 1128   GLUCOSE 66 (L) 03/14/2013 1039   BUN 17 07/22/2019 1119   BUN 11.8 10/03/2017 1128   CREATININE 0.75 07/22/2019 1119   CREATININE 0.66 04/01/2018 1101   CREATININE 0.7 10/03/2017 1128   CALCIUM 9.2 07/22/2019 1119   CALCIUM 9.7 10/03/2017 1128   PROT 6.4 (L) 07/22/2019 1119   PROT 7.6 10/03/2017 1128   ALBUMIN 3.7 07/22/2019 1119   ALBUMIN 3.7 10/03/2017  1128   AST 26 07/22/2019 1119   AST 25 04/01/2018 1101   AST 25 10/03/2017 1128   ALT 13 07/22/2019 1119   ALT 16 04/01/2018 1101   ALT 10 10/03/2017 1128   ALKPHOS 50 07/22/2019 1119   ALKPHOS 77 10/03/2017 1128   BILITOT 0.4 07/22/2019 1119   BILITOT 0.4 04/01/2018 1101   BILITOT 0.46 10/03/2017 1128   GFRNONAA >60 07/22/2019 1119   GFRNONAA >60 04/01/2018 1101   GFRAA >60 07/22/2019 1119   GFRAA >60 04/01/2018 1101    Lab Results  Component Value Date   TOTALPROTELP 6.8 12/16/2009    No results found for: KPAFRELGTCHN, LAMBDASER, KAPLAMBRATIO  Lab Results  Component Value Date   WBC 5.7 07/22/2019   NEUTROABS 4.4 07/22/2019   HGB 11.4 (L) 07/22/2019   HCT 33.4 (L) 07/22/2019   MCV 99.1 07/22/2019   PLT 240 07/22/2019    _0 @  No results found for: LABCA2  No components found for: [id number]  No results for input(s): INR in the last 168 hours.  No results found for: LABCA2  No results found for: VFI433  No results found for: IRJ188  No results found for: CZY606  No results found for: CA2729  No components found for: HGQUANT  No results found for: CEA1 / No results found for: CEA1   No results found  for: AFPTUMOR  No results found for: CHROMOGRNA  No results found for: PSA1  No visits with results within 3 Day(s) from this visit.  Latest known visit with results is:  Appointment on 07/22/2019  Component Date Value Ref Range Status  . Sodium 07/22/2019 129* 135 - 145 mmol/L Final  . Potassium 07/22/2019 5.1  3.5 - 5.1 mmol/L Final  . Chloride 07/22/2019 96* 98 - 111 mmol/L Final  . CO2 07/22/2019 26  22 - 32 mmol/L Final  . Glucose, Bld 07/22/2019 92  70 - 99 mg/dL Final  . BUN 07/22/2019 17  8 - 23 mg/dL Final  . Creatinine, Ser 07/22/2019 0.75  0.44 - 1.00 mg/dL Final  . Calcium 07/22/2019 9.2  8.9 - 10.3 mg/dL Final  . Total Protein 07/22/2019 6.4* 6.5 - 8.1 g/dL Final  . Albumin 07/22/2019 3.7  3.5 - 5.0 g/dL Final  . AST  07/22/2019 26  15 - 41 U/L Final  . ALT 07/22/2019 13  0 - 44 U/L Final  . Alkaline Phosphatase 07/22/2019 50  38 - 126 U/L Final  . Total Bilirubin 07/22/2019 0.4  0.3 - 1.2 mg/dL Final  . GFR calc non Af Amer 07/22/2019 >60  >60 mL/min Final  . GFR calc Af Amer 07/22/2019 >60  >60 mL/min Final  . Anion gap 07/22/2019 7  5 - 15 Final   Performed at [facility], [place] [address], [place], [place] [address]  . WBC 07/22/2019 5.7  4.0 - 10.5 K/uL Final  . RBC 07/22/2019 3.37* 3.87 - 5.11 MIL/uL Final  . Hemoglobin 07/22/2019 11.4* 12.0 - 15.0 g/dL Final  . HCT 07/22/2019 33.4* 36.0 - 46.0 % Final  . MCV 07/22/2019 99.1  80.0 - 100.0 fL Final  . MCH 07/22/2019 33.8  26.0 - 34.0 pg Final  . MCHC 07/22/2019 34.1  30.0 - 36.0 g/dL Final  . RDW 07/22/2019 13.0  11.5 - 15.5 % Final  . Platelets 07/22/2019 240  150 - 400 K/uL Final  . nRBC 07/22/2019 0.0  0.0 - 0.2 % Final  . Neutrophils Relative % 07/22/2019 77  % Final  . Neutro Abs 07/22/2019 4.4  1.7 - 7.7 K/uL Final  . Lymphocytes Relative 07/22/2019 15  % Final  . Lymphs Abs 07/22/2019 0.9  0.7 - 4.0 K/uL Final  . Monocytes Relative 07/22/2019 7  % Final  . Monocytes Absolute 07/22/2019 0.4  0.1 - 1.0 K/uL Final  . Eosinophils Relative 07/22/2019 0  % Final  . Eosinophils Absolute 07/22/2019 0.0  0.0 - 0.5 K/uL Final  . Basophils Relative 07/22/2019 0  % Final  . Basophils Absolute 07/22/2019 0.0  0.0 - 0.1 K/uL Final  . Immature Granulocytes 07/22/2019 1  % Final  . Abs Immature Granulocytes 07/22/2019 0.03  0.00 - 0.07 K/uL Final   Performed at [facility], [place] [address] [place], [place] [address]    (this displays the last labs from the last 3 days)  Lab Results  Component Value Date   TOTALPROTELP 6.8 12/16/2009   (this displays SPEP labs)  No results found for: KPAFRELGTCHN, LAMBDASER, KAPLAMBRATIO (kappa/lambda light chains)  No results found for: HGBA, HGBA2QUANT,  HGBFQUANT, HGBSQUAN (Hemoglobinopathy evaluation)   Lab Results  Component Value Date   LDH 236 03/14/2013    Lab Results  Component Value Date   IRON 92 06/21/2018   TIBC 261 06/21/2018   IRONPCTSAT 35 06/21/2018   (Iron and TIBC)  Lab Results  Component Value Date   FERRITIN 177 09/25/2018    Urinalysis    Component Value Date/Time   COLORURINE YELLOW 06/17/2016 1111   APPEARANCEUR CLOUDY (A) 06/17/2016 1111   LABSPEC 1.022 06/17/2016 1111   PHURINE 8.0 06/17/2016 1111   GLUCOSEU NEGATIVE 06/17/2016 1111   HGBUR NEGATIVE 06/17/2016 1111   BILIRUBINUR NEGATIVE 06/17/2016 1111   KETONESUR NEGATIVE 06/17/2016 1111   PROTEINUR NEGATIVE 06/17/2016 1111   UROBILINOGEN 0.2 10/27/2015 1038   NITRITE NEGATIVE 06/17/2016 1111   LEUKOCYTESUR LARGE (A) 06/17/2016 1111     STUDIES: No results found.  ELIGIBLE FOR AVAILABLE RESEARCH PROTOCOL: no  ASSESSMENT: 73 y.o.   (1) iron deficiency anemia:  (a) status post Feraheme x2 October 2018  (2)  chronic inflammation (inclusion body myositis)-- followed by Dr Kathlene November  (3) genetics testing 01/15/2018 through the Common Hereditary Cancer Panel offered by [facility] found no deleterious mutations in APC, ATM, AXIN2, BARD1, BMPR1A, BRCA1, BRCA2, BRIP1, CDH1, CDKN2A (p14ARF), CDKN2A (p16INK4a), CKD4, CHEK2, CTNNA1, DICER1, EPCAM (Deletion/duplication testing only), GREM1 (promoter region deletion/duplication testing only), KIT, MEN1, MLH1, MSH2, MSH3, MSH6, MUTYH, NBN, NF1, NHTL1, PALB2, PDGFRA, PMS2, POLD1, POLE, PTEN, RAD50, RAD51C, RAD51D, SDHB, SDHC, SDHD, SMAD4, SMARCA4. STK11, TP53, TSC1, TSC2, and VHL.  The following genes were evaluated for sequence changes only: SDHA and HOXB13 c.251G>A variant only.   PLAN: [name] is doing much better on her current rheumatologic medications.  From a hematologic point of view her hemoglobin is fine.  Likely it is slightly depressed secondary to the chronic inflammation or its treatment.   Generally however if the hemoglobin is 10 or greater there are no associated symptoms  Aside from Dr. Kathlene November she sees Dr. Shelia Media and Dr. Phineas Real on a regular basis.  I am going to see her again in a year with lab work that day, but if there is any issue relating to her anemia of course I will be glad to see her before then.  Magrinat, Virgie Dad, MD  07/29/19 3:36 PM Medical Oncology and Hematology [facility] [address] [place], [place] [address] Tel. [phone]    Fax. [phone]   I, Wilburn Mylar, am acting as scribe for Dr. Virgie Dad. Magrinat.  I, Lurline Del MD, have reviewed the above documentation for accuracy and completeness, and I agree with the above.

## 2019-07-30 ENCOUNTER — Telehealth: Payer: Self-pay | Admitting: Oncology

## 2019-07-30 NOTE — Telephone Encounter (Signed)
I talk with patient regarding schedule  

## 2019-08-18 DIAGNOSIS — H401112 Primary open-angle glaucoma, right eye, moderate stage: Secondary | ICD-10-CM | POA: Diagnosis not present

## 2019-08-18 DIAGNOSIS — H401122 Primary open-angle glaucoma, left eye, moderate stage: Secondary | ICD-10-CM | POA: Diagnosis not present

## 2019-08-19 DIAGNOSIS — N390 Urinary tract infection, site not specified: Secondary | ICD-10-CM | POA: Diagnosis not present

## 2019-08-19 DIAGNOSIS — R3915 Urgency of urination: Secondary | ICD-10-CM | POA: Diagnosis not present

## 2019-08-28 DIAGNOSIS — R69 Illness, unspecified: Secondary | ICD-10-CM | POA: Diagnosis not present

## 2019-09-08 DIAGNOSIS — E7800 Pure hypercholesterolemia, unspecified: Secondary | ICD-10-CM | POA: Diagnosis not present

## 2019-09-08 DIAGNOSIS — I10 Essential (primary) hypertension: Secondary | ICD-10-CM | POA: Diagnosis not present

## 2019-09-08 DIAGNOSIS — I110 Hypertensive heart disease with heart failure: Secondary | ICD-10-CM | POA: Diagnosis not present

## 2019-09-10 DIAGNOSIS — Z0000 Encounter for general adult medical examination without abnormal findings: Secondary | ICD-10-CM | POA: Diagnosis not present

## 2019-09-10 DIAGNOSIS — Z23 Encounter for immunization: Secondary | ICD-10-CM | POA: Diagnosis not present

## 2019-09-10 DIAGNOSIS — K219 Gastro-esophageal reflux disease without esophagitis: Secondary | ICD-10-CM | POA: Diagnosis not present

## 2019-09-10 DIAGNOSIS — N390 Urinary tract infection, site not specified: Secondary | ICD-10-CM | POA: Diagnosis not present

## 2019-09-10 DIAGNOSIS — D509 Iron deficiency anemia, unspecified: Secondary | ICD-10-CM | POA: Diagnosis not present

## 2019-09-10 DIAGNOSIS — I10 Essential (primary) hypertension: Secondary | ICD-10-CM | POA: Diagnosis not present

## 2019-09-12 ENCOUNTER — Other Ambulatory Visit: Payer: Self-pay | Admitting: Internal Medicine

## 2019-09-12 DIAGNOSIS — Z1231 Encounter for screening mammogram for malignant neoplasm of breast: Secondary | ICD-10-CM

## 2019-09-18 ENCOUNTER — Other Ambulatory Visit: Payer: Self-pay | Admitting: Oncology

## 2019-09-19 DIAGNOSIS — N3941 Urge incontinence: Secondary | ICD-10-CM | POA: Diagnosis not present

## 2019-09-19 DIAGNOSIS — N3000 Acute cystitis without hematuria: Secondary | ICD-10-CM | POA: Diagnosis not present

## 2019-09-24 ENCOUNTER — Encounter: Payer: Self-pay | Admitting: Gynecology

## 2019-09-29 ENCOUNTER — Other Ambulatory Visit: Payer: Self-pay

## 2019-09-29 MED ORDER — NONFORMULARY OR COMPOUNDED ITEM: 0 refills | Status: DC

## 2019-10-24 DIAGNOSIS — H401122 Primary open-angle glaucoma, left eye, moderate stage: Secondary | ICD-10-CM | POA: Diagnosis not present

## 2019-10-27 DIAGNOSIS — H401112 Primary open-angle glaucoma, right eye, moderate stage: Secondary | ICD-10-CM | POA: Diagnosis not present

## 2019-10-27 DIAGNOSIS — H401122 Primary open-angle glaucoma, left eye, moderate stage: Secondary | ICD-10-CM | POA: Diagnosis not present

## 2019-10-31 ENCOUNTER — Ambulatory Visit
Admission: RE | Admit: 2019-10-31 | Discharge: 2019-10-31 | Disposition: A | Discharge status: Home / Self Care | Payer: Medicare HMO | Source: Ambulatory Visit | Attending: Internal Medicine | Admitting: Internal Medicine

## 2019-10-31 ENCOUNTER — Other Ambulatory Visit: Payer: Self-pay

## 2019-10-31 DIAGNOSIS — N139 Obstructive and reflux uropathy, unspecified: Secondary | ICD-10-CM | POA: Diagnosis not present

## 2019-10-31 DIAGNOSIS — R8271 Bacteriuria: Secondary | ICD-10-CM | POA: Diagnosis not present

## 2019-10-31 DIAGNOSIS — N3941 Urge incontinence: Secondary | ICD-10-CM | POA: Diagnosis not present

## 2019-10-31 DIAGNOSIS — Z1231 Encounter for screening mammogram for malignant neoplasm of breast: Secondary | ICD-10-CM

## 2019-11-11 DIAGNOSIS — M25571 Pain in right ankle and joints of right foot: Secondary | ICD-10-CM | POA: Diagnosis not present

## 2019-11-11 DIAGNOSIS — M159 Polyosteoarthritis, unspecified: Secondary | ICD-10-CM | POA: Diagnosis not present

## 2019-11-11 DIAGNOSIS — M25572 Pain in left ankle and joints of left foot: Secondary | ICD-10-CM | POA: Diagnosis not present

## 2019-11-11 DIAGNOSIS — Z79899 Other long term (current) drug therapy: Secondary | ICD-10-CM | POA: Diagnosis not present

## 2019-11-11 DIAGNOSIS — M3500 Sicca syndrome, unspecified: Secondary | ICD-10-CM | POA: Diagnosis not present

## 2019-11-11 DIAGNOSIS — R600 Localized edema: Secondary | ICD-10-CM | POA: Diagnosis not present

## 2019-11-11 DIAGNOSIS — M810 Age-related osteoporosis without current pathological fracture: Secondary | ICD-10-CM | POA: Diagnosis not present

## 2019-11-11 DIAGNOSIS — R69 Illness, unspecified: Secondary | ICD-10-CM | POA: Diagnosis not present

## 2019-11-11 DIAGNOSIS — G7241 Inclusion body myositis [IBM]: Secondary | ICD-10-CM | POA: Diagnosis not present

## 2019-11-11 DIAGNOSIS — M7989 Other specified soft tissue disorders: Secondary | ICD-10-CM | POA: Diagnosis not present

## 2019-11-11 DIAGNOSIS — I872 Venous insufficiency (chronic) (peripheral): Secondary | ICD-10-CM | POA: Diagnosis not present

## 2019-11-11 DIAGNOSIS — M609 Myositis, unspecified: Secondary | ICD-10-CM | POA: Diagnosis not present

## 2019-11-12 DIAGNOSIS — M609 Myositis, unspecified: Secondary | ICD-10-CM | POA: Diagnosis not present

## 2019-11-12 DIAGNOSIS — Z79899 Other long term (current) drug therapy: Secondary | ICD-10-CM | POA: Diagnosis not present

## 2019-11-12 DIAGNOSIS — D509 Iron deficiency anemia, unspecified: Secondary | ICD-10-CM | POA: Diagnosis not present

## 2019-11-12 DIAGNOSIS — I10 Essential (primary) hypertension: Secondary | ICD-10-CM | POA: Diagnosis not present

## 2019-11-12 DIAGNOSIS — I110 Hypertensive heart disease with heart failure: Secondary | ICD-10-CM | POA: Diagnosis not present

## 2019-11-25 ENCOUNTER — Encounter: Payer: Medicare HMO | Admitting: Gynecology

## 2019-12-09 DIAGNOSIS — R600 Localized edema: Secondary | ICD-10-CM | POA: Diagnosis not present

## 2019-12-18 ENCOUNTER — Encounter: Payer: Medicare HMO | Admitting: Gynecology

## 2019-12-23 DIAGNOSIS — R600 Localized edema: Secondary | ICD-10-CM | POA: Diagnosis not present

## 2019-12-29 DIAGNOSIS — H401112 Primary open-angle glaucoma, right eye, moderate stage: Secondary | ICD-10-CM | POA: Diagnosis not present

## 2019-12-29 DIAGNOSIS — H401122 Primary open-angle glaucoma, left eye, moderate stage: Secondary | ICD-10-CM | POA: Diagnosis not present

## 2020-01-26 ENCOUNTER — Encounter: Payer: Medicare HMO | Admitting: Obstetrics and Gynecology

## 2020-02-16 DIAGNOSIS — M810 Age-related osteoporosis without current pathological fracture: Secondary | ICD-10-CM | POA: Diagnosis not present

## 2020-03-09 ENCOUNTER — Other Ambulatory Visit: Payer: Self-pay

## 2020-03-10 ENCOUNTER — Telehealth: Payer: Self-pay | Admitting: *Deleted

## 2020-03-10 ENCOUNTER — Encounter: Payer: Medicare HMO | Admitting: Obstetrics and Gynecology

## 2020-03-10 MED ORDER — NONFORMULARY OR COMPOUNDED ITEM: 0 refills | Status: DC

## 2020-03-10 NOTE — Telephone Encounter (Signed)
Patient called had to reschedule her annual exam due to not feeling well. Now scheduled on 04/12/20, requesting refill on compound estradiol cream. Rx called onto [facility].  Patient informed.

## 2020-03-29 DIAGNOSIS — H401122 Primary open-angle glaucoma, left eye, moderate stage: Secondary | ICD-10-CM | POA: Diagnosis not present

## 2020-03-29 DIAGNOSIS — H401112 Primary open-angle glaucoma, right eye, moderate stage: Secondary | ICD-10-CM | POA: Diagnosis not present

## 2020-04-06 DIAGNOSIS — M609 Myositis, unspecified: Secondary | ICD-10-CM | POA: Diagnosis not present

## 2020-04-06 DIAGNOSIS — M810 Age-related osteoporosis without current pathological fracture: Secondary | ICD-10-CM | POA: Diagnosis not present

## 2020-04-06 DIAGNOSIS — M25572 Pain in left ankle and joints of left foot: Secondary | ICD-10-CM | POA: Diagnosis not present

## 2020-04-06 DIAGNOSIS — R69 Illness, unspecified: Secondary | ICD-10-CM | POA: Diagnosis not present

## 2020-04-06 DIAGNOSIS — R600 Localized edema: Secondary | ICD-10-CM | POA: Diagnosis not present

## 2020-04-06 DIAGNOSIS — G7241 Inclusion body myositis [IBM]: Secondary | ICD-10-CM | POA: Diagnosis not present

## 2020-04-06 DIAGNOSIS — M159 Polyosteoarthritis, unspecified: Secondary | ICD-10-CM | POA: Diagnosis not present

## 2020-04-06 DIAGNOSIS — M3500 Sicca syndrome, unspecified: Secondary | ICD-10-CM | POA: Diagnosis not present

## 2020-04-06 DIAGNOSIS — Z79899 Other long term (current) drug therapy: Secondary | ICD-10-CM | POA: Diagnosis not present

## 2020-04-06 DIAGNOSIS — K219 Gastro-esophageal reflux disease without esophagitis: Secondary | ICD-10-CM | POA: Diagnosis not present

## 2020-04-09 ENCOUNTER — Other Ambulatory Visit: Payer: Self-pay

## 2020-04-12 ENCOUNTER — Encounter: Payer: Self-pay | Admitting: Obstetrics and Gynecology

## 2020-04-12 ENCOUNTER — Other Ambulatory Visit: Payer: Self-pay

## 2020-04-12 ENCOUNTER — Ambulatory Visit (INDEPENDENT_AMBULATORY_CARE_PROVIDER_SITE_OTHER): Payer: Medicare HMO | Admitting: Obstetrics and Gynecology

## 2020-04-12 VITALS — BP 144/84 | Ht <= 58 in | Wt 114.0 lb

## 2020-04-12 DIAGNOSIS — Z01419 Encounter for gynecological examination (general) (routine) without abnormal findings: Secondary | ICD-10-CM | POA: Diagnosis not present

## 2020-04-12 DIAGNOSIS — Z79890 Hormone replacement therapy (postmenopausal): Secondary | ICD-10-CM | POA: Diagnosis not present

## 2020-04-12 DIAGNOSIS — N8189 Other female genital prolapse: Secondary | ICD-10-CM

## 2020-04-12 DIAGNOSIS — N814 Uterovaginal prolapse, unspecified: Secondary | ICD-10-CM

## 2020-04-12 DIAGNOSIS — M810 Age-related osteoporosis without current pathological fracture: Secondary | ICD-10-CM | POA: Diagnosis not present

## 2020-04-12 MED ORDER — NONFORMULARY OR COMPOUNDED ITEM: 5 refills | Status: DC

## 2020-04-12 MED ORDER — NONFORMULARY OR COMPOUNDED ITEM: 4 refills | Status: DC

## 2020-04-12 NOTE — Progress Notes (Signed)
[name] 04/19/1946 [id number]  SUBJECTIVE:  74 y.o. [id number] female for annual routine gynecologic exam.  She has continued without her pessary and finds herself doing fine.  She has no gynecologic concerns.  Current Outpatient Medications  Medication Sig Dispense Refill  . acetaminophen (TYLENOL) 500 MG tablet Take 500 mg by mouth every 6 (six) hours as needed for moderate pain.     . Ascorbic Acid (VITAMIN C PO) Take 250 mg by mouth daily.     Marland Kitchen aspirin 81 MG chewable tablet Chew 1 tablet (81 mg total) by mouth daily.    . Aspirin-Salicylamide-Caffeine (BC HEADACHE PO) Take 1 packet by mouth daily as needed (pain).    Marland Kitchen azaTHIOprine (IMURAN) 50 MG tablet Take 50 mg by mouth daily.     . hydroxychloroquine (PLAQUENIL) 200 MG tablet Take 200 mg by mouth daily.    Marland Kitchen losartan (COZAAR) 50 MG tablet Take 50 mg by mouth daily.     . Magnesium 250 MG TABS Take 250 mg by mouth 2 (two) times daily.    . Multiple Vitamin (MULTIVITAMIN) tablet Take 1 tablet by mouth daily.    . NONFORMULARY OR COMPOUNDED ITEM Estradiol vaginal cream [id number] once applicator twice weekly 30 each 0  . omeprazole (PRILOSEC) 20 MG capsule Take 20 mg by mouth daily as needed (indigestion).     . predniSONE (DELTASONE) 5 MG tablet Take 1 tablet (5 mg total) by mouth daily with breakfast. (Patient taking differently: Take 7 mg by mouth daily with breakfast. ) 30 tablet 0  . Tetrahydrozoline HCl (VISINE OP) Place 1 drop into both eyes daily as needed (dry eyes). Reported on 05/16/2016    . timolol (BETIMOL) 0.5 % ophthalmic solution Place 1 drop into both eyes at bedtime.    . traMADol (ULTRAM) 50 MG tablet Take 1 tablet (50 mg total) by mouth 4 (four) times daily - after meals and at bedtime. (Patient taking differently: Take 50 mg by mouth every 6 (six) hours as needed for moderate pain or severe pain. ) 30 tablet 0  . Triamterene-HCTZ (MAXZIDE-25 PO) Take 1 tablet by mouth daily as needed (fluid).    . zoledronic acid  (RECLAST) 5 MG/100ML SOLN injection Inject 5 mg into the vein once.     No current facility-administered medications for this visit.   Allergies: Macrodantin [nitrofurantoin macrocrystal], Phenylephrine, and Lisinopril  No LMP recorded. Patient is postmenopausal.  Past medical history,surgical history, problem list, medications, allergies, family history and social history were all reviewed and documented as reviewed in the EPIC chart.  ROS:  Feeling well. No dyspnea or chest pain on exertion.  No abdominal pain, change in bowel habits, black or bloody stools.  No urinary tract symptoms. GYN ROS: no abnormal bleeding, pelvic pain or discharge, no breast pain or new or enlarging lumps on self exam. No neurological complaints.  OBJECTIVE:  BP (!) 144/84   Ht 4\' 10"  (1.473 m)   Wt 114 lb (51.7 kg)   BMI 23.83 kg/m  The patient appears well, alert, oriented x 3, in no distress. ENT normal.  Neck supple. No cervical or supraclavicular adenopathy or thyromegaly.  Lungs are clear, good air entry, no wheezes, rhonchi or rales. S1 and S2 normal, no murmurs, regular rate and rhythm.  Abdomen soft without tenderness, guarding, mass or organomegaly.  Neurological is normal, no focal findings.  BREAST EXAM: breasts appear normal, no suspicious masses, no skin or nipple changes or axillary nodes  PELVIC EXAM:  VULVA: normal appearing vulva with no masses, tenderness or lesions, VAGINA: Grade 2 cystocele, grade 1 rectocele and uterine prolapse.  Normal appearing vagina with normal color and discharge, no lesions, CERVIX: normal appearing cervix without discharge or lesions, UTERUS: uterus is normal size, shape, consistency and nontender, ADNEXA: normal adnexa in size, nontender and no masses, RECTAL: normal rectal, no masses  Chaperone: [name] present during the examination  ASSESSMENT:  74 y.o. [name] here for annual gynecologic exam  PLAN:   1. Postmenopausal.  She would like to continue to  use the estradiol vaginal cream twice weekly which she receives through the [facility].  She knows to report any vaginal bleeding.  Small risks of systemic absorption of estrogens such as thrombotic diseases, endometrial cancer, breast cancer are reviewed and she accepts those risks.  Refill provided for 1 year. 2.  No significant history of abnormal Pap smears.  We discussed the current screening guidelines and she agrees to stop screening based upon age criteria.   3.  Pelvic organ prolapse.  Cystocele, uterine prolapse, mild rectocele.  Currently asymptomatic and doing okay without the pessary.  She will let us know if anything gets worse. 4. Mammogram 10/2019.  Normal breast exam today.  Family history of breast cancer in her father and 2 paternal aunts.  Mother had ovarian cancer in her 45s.  She says she had negative genetic screening in the past.  We again discussed previous options of increased surveillance with breast MRIs and pelvic ultrasounds in addition to CA-125 surveillance, prophylactic surgery, she remains not interested.  She is reminded to schedule an annual mammogram this year when due. 5. Colonoscopy 2018.  Recommended that she follow up at the recommended interval.   6.  Osteoporosis.  DEXA 2019.  She is being followed elsewhere for this.  She will continue to follow-up with them regard to her bone health.   7. Health maintenance.  No labs today as she normally has these completed with her primary care provider.  Return annually or sooner, prn.  Joseph Pierini MD 04/12/20

## 2020-04-13 ENCOUNTER — Telehealth: Payer: Self-pay | Admitting: *Deleted

## 2020-04-13 NOTE — Telephone Encounter (Signed)
Rx called into [facility].

## 2020-04-13 NOTE — Telephone Encounter (Signed)
-----   Message from Joseph Pierini, MD sent at 04/12/2020  3:42 PM EDT ----- Regarding: estrogen cream Hi I placed an rx for vaginal estrogen cream at [facility], I intended for a 1 year supply. Thank you.

## 2020-04-30 DIAGNOSIS — R8271 Bacteriuria: Secondary | ICD-10-CM | POA: Diagnosis not present

## 2020-04-30 DIAGNOSIS — N3941 Urge incontinence: Secondary | ICD-10-CM | POA: Diagnosis not present

## 2020-04-30 DIAGNOSIS — N139 Obstructive and reflux uropathy, unspecified: Secondary | ICD-10-CM | POA: Diagnosis not present

## 2020-06-16 DIAGNOSIS — H401122 Primary open-angle glaucoma, left eye, moderate stage: Secondary | ICD-10-CM | POA: Diagnosis not present

## 2020-06-16 DIAGNOSIS — M3501 Sicca syndrome with keratoconjunctivitis: Secondary | ICD-10-CM | POA: Diagnosis not present

## 2020-06-16 DIAGNOSIS — Z961 Presence of intraocular lens: Secondary | ICD-10-CM | POA: Diagnosis not present

## 2020-07-05 ENCOUNTER — Other Ambulatory Visit: Payer: Self-pay | Admitting: Oncology

## 2020-07-05 DIAGNOSIS — D508 Other iron deficiency anemias: Secondary | ICD-10-CM

## 2020-07-06 ENCOUNTER — Telehealth: Payer: Self-pay | Admitting: Oncology

## 2020-07-06 NOTE — Telephone Encounter (Signed)
Rescheduled patient per 7/19 message. Patient is aware of new appointment date and time.

## 2020-07-15 DIAGNOSIS — G7241 Inclusion body myositis [IBM]: Secondary | ICD-10-CM | POA: Diagnosis not present

## 2020-07-15 DIAGNOSIS — M609 Myositis, unspecified: Secondary | ICD-10-CM | POA: Diagnosis not present

## 2020-07-15 DIAGNOSIS — M3500 Sicca syndrome, unspecified: Secondary | ICD-10-CM | POA: Diagnosis not present

## 2020-07-15 DIAGNOSIS — M159 Polyosteoarthritis, unspecified: Secondary | ICD-10-CM | POA: Diagnosis not present

## 2020-07-15 DIAGNOSIS — R69 Illness, unspecified: Secondary | ICD-10-CM | POA: Diagnosis not present

## 2020-07-15 DIAGNOSIS — K219 Gastro-esophageal reflux disease without esophagitis: Secondary | ICD-10-CM | POA: Diagnosis not present

## 2020-07-15 DIAGNOSIS — M810 Age-related osteoporosis without current pathological fracture: Secondary | ICD-10-CM | POA: Diagnosis not present

## 2020-07-15 DIAGNOSIS — Z79899 Other long term (current) drug therapy: Secondary | ICD-10-CM | POA: Diagnosis not present

## 2020-07-15 DIAGNOSIS — R600 Localized edema: Secondary | ICD-10-CM | POA: Diagnosis not present

## 2020-07-29 ENCOUNTER — Ambulatory Visit: Payer: Medicare HMO | Admitting: Oncology

## 2020-07-29 ENCOUNTER — Other Ambulatory Visit: Payer: Medicare HMO

## 2020-09-01 ENCOUNTER — Other Ambulatory Visit: Payer: Self-pay

## 2020-09-01 ENCOUNTER — Inpatient Hospital Stay: Payer: Medicare HMO

## 2020-09-01 ENCOUNTER — Encounter: Payer: Self-pay | Admitting: Adult Health

## 2020-09-01 ENCOUNTER — Inpatient Hospital Stay: Payer: Medicare HMO | Admitting: Adult Health

## 2020-09-01 ENCOUNTER — Inpatient Hospital Stay: Payer: Medicare HMO | Attending: Adult Health

## 2020-09-01 VITALS — BP 133/71 | HR 64 | Temp 97.8°F | Resp 17 | Ht <= 58 in | Wt 111.1 lb

## 2020-09-01 DIAGNOSIS — Z801 Family history of malignant neoplasm of trachea, bronchus and lung: Secondary | ICD-10-CM | POA: Insufficient documentation

## 2020-09-01 DIAGNOSIS — D508 Other iron deficiency anemias: Secondary | ICD-10-CM | POA: Diagnosis present

## 2020-09-01 DIAGNOSIS — G63 Polyneuropathy in diseases classified elsewhere: Secondary | ICD-10-CM

## 2020-09-01 DIAGNOSIS — Z7982 Long term (current) use of aspirin: Secondary | ICD-10-CM | POA: Diagnosis not present

## 2020-09-01 DIAGNOSIS — M3503 Sicca syndrome with myopathy: Secondary | ICD-10-CM

## 2020-09-01 DIAGNOSIS — D519 Vitamin B12 deficiency anemia, unspecified: Secondary | ICD-10-CM

## 2020-09-01 DIAGNOSIS — M069 Rheumatoid arthritis, unspecified: Secondary | ICD-10-CM

## 2020-09-01 DIAGNOSIS — Z8041 Family history of malignant neoplasm of ovary: Secondary | ICD-10-CM | POA: Insufficient documentation

## 2020-09-01 DIAGNOSIS — Z803 Family history of malignant neoplasm of breast: Secondary | ICD-10-CM | POA: Insufficient documentation

## 2020-09-01 DIAGNOSIS — M3320 Polymyositis, organ involvement unspecified: Secondary | ICD-10-CM

## 2020-09-01 DIAGNOSIS — Z79899 Other long term (current) drug therapy: Secondary | ICD-10-CM | POA: Diagnosis not present

## 2020-09-01 DIAGNOSIS — I10 Essential (primary) hypertension: Secondary | ICD-10-CM | POA: Insufficient documentation

## 2020-09-01 LAB — CBC WITH DIFFERENTIAL/PLATELET
Abs Immature Granulocytes: 0.03 10*3/uL (ref 0.00–0.07)
Basophils Absolute: 0 10*3/uL (ref 0.0–0.1)
Basophils Relative: 0 %
Eosinophils Absolute: 0.1 10*3/uL (ref 0.0–0.5)
Eosinophils Relative: 2 %
HCT: 29.6 % — ABNORMAL LOW (ref 36.0–46.0)
Hemoglobin: 10.2 g/dL — ABNORMAL LOW (ref 12.0–15.0)
Immature Granulocytes: 1 %
Lymphocytes Relative: 10 %
Lymphs Abs: 0.5 10*3/uL — ABNORMAL LOW (ref 0.7–4.0)
MCH: 37.4 pg — ABNORMAL HIGH (ref 26.0–34.0)
MCHC: 34.5 g/dL (ref 30.0–36.0)
MCV: 108.4 fL — ABNORMAL HIGH (ref 80.0–100.0)
Monocytes Absolute: 0.4 10*3/uL (ref 0.1–1.0)
Monocytes Relative: 7 %
Neutro Abs: 4.1 10*3/uL (ref 1.7–7.7)
Neutrophils Relative %: 80 %
Platelets: 187 10*3/uL (ref 150–400)
RBC: 2.73 MIL/uL — ABNORMAL LOW (ref 3.87–5.11)
RDW: 15.3 % (ref 11.5–15.5)
WBC: 5.2 10*3/uL (ref 4.0–10.5)
nRBC: 0.4 % — ABNORMAL HIGH (ref 0.0–0.2)

## 2020-09-01 LAB — RETIC PANEL
Immature Retic Fract: 11.5 % (ref 2.3–15.9)
RBC.: 2.72 MIL/uL — ABNORMAL LOW (ref 3.87–5.11)
Retic Count, Absolute: 67.2 10*3/uL (ref 19.0–186.0)
Retic Ct Pct: 2.5 % (ref 0.4–3.1)
Reticulocyte Hemoglobin: 41.2 pg (ref >27.9)

## 2020-09-01 LAB — COMPREHENSIVE METABOLIC PANEL
ALT: 9 U/L (ref 0–44)
AST: 28 U/L (ref 15–41)
Albumin: 3.6 g/dL (ref 3.5–5.0)
Alkaline Phosphatase: 48 U/L (ref 38–126)
Anion gap: 8 (ref 5–15)
BUN: 19 mg/dL (ref 8–23)
CO2: 26 mmol/L (ref 22–32)
Calcium: 9.7 mg/dL (ref 8.9–10.3)
Chloride: 99 mmol/L (ref 98–111)
Creatinine, Ser: 0.83 mg/dL (ref 0.44–1.00)
GFR calc Af Amer: >60 mL/min (ref >60)
GFR calc non Af Amer: >60 mL/min (ref >60)
Glucose, Bld: 89 mg/dL (ref 70–99)
Potassium: 4.2 mmol/L (ref 3.5–5.1)
Sodium: 133 mmol/L — ABNORMAL LOW (ref 135–145)
Total Bilirubin: 0.7 mg/dL (ref 0.3–1.2)
Total Protein: 6.8 g/dL (ref 6.5–8.1)

## 2020-09-01 LAB — FERRITIN: Ferritin: 323 ng/mL — ABNORMAL HIGH (ref 11–307)

## 2020-09-01 LAB — SAVE SMEAR(SSMR), FOR PROVIDER SLIDE REVIEW

## 2020-09-01 LAB — VITAMIN B12: Vitamin B-12: 409 pg/mL (ref 180–914)

## 2020-09-01 LAB — FOLATE: Folate: 16.8 ng/mL (ref >5.9)

## 2020-09-01 NOTE — Progress Notes (Signed)
[facility]  [phone] [phone]) [phone]     ID: CHIHIRO FREY DOB: 1946-06-11  MR#: [id number]  TIR#:[id number]  Patient Care Team: Deland Pretty, MD as PCP - General (Internal Medicine) Magrinat, Virgie Dad, MD as Consulting Physician (Oncology) Lahoma Rocker, MD as Consulting Physician (Rheumatology) Fontaine, Belinda Block, MD (Inactive) as Consulting Physician (Gynecology) Bond, Tracie Harrier, MD as Consulting Physician (Ophthalmology) OTHER MD:  CHIEF COMPLAINT: Iron deficiency anemia in the setting of chronic inflammation   CURRENT TREATMENT: Observation   INTERVAL HISTORY: TANISHI NAULT is here for f/u of her anemia secondary to her rheumatologic condition.  She is taking Imuran, Plaquenil, and Prednisone.  She is following with her rheumatologist every 3-6 months.  Her hemoglobin is 10.2 today, down approximately 1 point from last year.   REVIEW OF SYSTEMS: Folashade has a swallowing issue related to her [employer].  She does have some weakened esophageal muscles, and also dry mouth from sjogrens.  This has impacted her dietary intake some, but she is eating, and does her best to eat nutritiously.  She eats two meals per day, small meal in the morning, and another normal meal.  She has lost about 7-8 pounds in the past year.  She denies any bowel/bladder changes, any new pain, fever, chills, cough, or shortness of breath.  A detailed ROS was otherwise non contributory.     HISTORY OF CURRENT ILLNESS: From the original intake note:   Ms. Boehne has a long history of iron deficiency anemia dating back to early 2009.  She was seen by my former partner Dr. Ralene Ok and a thorough evaluation including GI work-up failed to demonstrate a clear etiology.  She was found to have a history of Zenker's diverticulum and esophageal stricture.  There was no evidence of celiac disease.  Her anemia and her iron deficiency did respond to oral supplementation.  The patient  has significant comorbidities including polymyositis, Sjogren's syndrome and Raynaud's phenomenon.  In October 2018 she was evaluated by another 1 of my partners, Dr. Alen Blew.  The patient received Feraheme 10/10/2017 and 10/17/2017.  She was then transferred to the care of our locum's, Dr. Lebron Conners, as of January 2019.  He noted after the Feraheme the anemia had resolved, and the MCV had normalized, and the ferritin had significantly improved.  He suggested monitoring to keep the ferritin between 50 and to 110 in this patient with a chronic inflammatory state.  At the 04/21/2018 visit he also noted subclinical B12 deficiency.  He recommended the start of oral B12 supplementation.  The patient's subsequent history is as detailed below   PAST MEDICAL HISTORY: Past Medical History:  Diagnosis Date  . AAA (abdominal aortic aneurysm) ([place])    small      report in epic 11/11 states no aaa    . Acute blood loss anemia   . Cataract    surgery on both eyes  . Chronic lower back pain    "for 6 years; relieved since back OR 07/2013" (09/08/2013)  . Difficult intubation    "ive been told im small", no problems listed with back sx in past  . Diverticulosis   . Family history of anesthesia complication    "sister has severe sleep apnea; she has alot of trouble w/anesthesia" (09/08/2013)  . Family history of breast cancer   . Family history of breast cancer in female   . Family history of lung cancer   . Family history of ovarian cancer   .  Gastritis   . GERD (gastroesophageal reflux disease)   . Glaucoma   . Hiatal hernia   . High cholesterol    "not severe; can't take the medicine" (09/08/2013)  . History of hiatal hernia    "small one"  . HTN (hypertension)   . Iron deficiency anemia    hx  . Migraines    "hurt like a migraine; don't get them like I did when I was younger" (09/08/2013)  . Multiple thyroid nodules   . OA (osteoarthritis)   . Osteoporosis   . Pancreatitis   . PHN (postherpetic  neuralgia)    right side from shingles  . Polymyositis ([place])   . Raynaud phenomenon   . 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 Shingles   . Sjogren's syndrome ([place])   . Swallowing difficulty    unable to swallow whole pills  . Swelling of left lower extremity     PAST SURGICAL HISTORY: Past Surgical History:  Procedure Laterality Date  . ABDOMINAL EXPOSURE N/A 06/02/2016   Procedure: ABDOMINAL EXPOSURE;  Surgeon: Rosetta Posner, MD;  Location: [facility];  Service: Vascular;  Laterality: N/A;  . ANTERIOR LAT LUMBAR FUSION  06/02/2016   Procedure: Thoracic twelve-Lumbar one, Lumbar one-two anterior Lateral Interbody Fusion;  Surgeon: Erline Levine, MD;  Location: [facility] NEURO ORS;  Service: Neurosurgery;;  . ANTERIOR LUMBAR FUSION N/A 06/02/2016   Procedure: Lumbar five-sacral one Anterior lumbar interbody fusion with Dr. Donnetta Hutching for approach;  Surgeon: Erline Levine, MD;  Location: [facility] NEUR[facility] ORS;  Service: Neurosurgery;  Laterality: N/A;  . APPLICATION OF INTRAOPERATIVE CT SCAN N/A 06/02/2016   Procedure: APPLICATION OF INTRAOPERATIVE CT SCAN;  Surgeon: Erline Levine, MD;  Location: [facility] NEURO ORS;  Service: Neurosurgery;  Laterality: N/A;  . CATARACT EXTRACTION W/ INTRAOCULAR LENS  IMPLANT, BILATERAL Bilateral 12/2010-01/2011  . DENTAL SURGERY  11/2011-12/2011   one on each side in my bottom jaw; dental implants" (09/08/2013)  . ESOPHAGEAL DILATION     "a few times before 02/2010; not since" (09/08/2013)  . ESOPHAGEAL MANOMETRY N/A 02/25/2016   Procedure: ESOPHAGEAL MANOMETRY (EM);  Surgeon: Manus Gunning, MD;  Location: [facility];  Service: Gastroenterology;  Laterality: N/A;  . ESOPHAGOGASTRODUODENOSCOPY    . LUMBAR FUSION  Aug 14, 2013   Dr. Consuello Masse  . POSTERIOR LUMBAR FUSION 4 LEVEL Bilateral 06/02/2016   Procedure: Exploration of Fusion Lumbar two-five, Thoracic ten-ileum posterior fusion, Posterolateral arthrodesis with AIRO;  Surgeon: Erline Levine, MD;  Location: [facility] NEURO ORS;  Service:  Neurosurgery;  Laterality: Bilateral;  . REPAIR ZENKER'S DIVERTICULA  02/2010   "diverticulectomy w/zenker's pouch correction" (09/08/2013)    FAMILY HISTORY Family History  Problem Relation Age of Onset  . Ovarian cancer Mother 66  . Hypertension Father   . Heart disease Father   . Breast cancer Father 69       lumpectomy and tamoxifen  . Colon cancer Father 75  . Psoriasis Sister   . Sarcoidosis Sister   . Arthritis Sister        psoriatic arthritis  . Diabetes Sister   . Arthritis Maternal Uncle        rheumatiod arthritis  . Cancer Maternal Uncle 65       in back- unsure if a bone cancer?  . Diabetes Son   . Heart disease Brother   . Breast cancer Paternal Aunt 63       lumpect and radiaion  . Breast cancer Paternal Aunt 7  lumpect and radiaion  . Lung cancer Paternal Uncle   . Heart disease Maternal Grandfather        hx smoking, died 'younger'  . Heart disease Paternal Grandfather 34  . Breast cancer Paternal Aunt        post menopause    GYNECOLOGIC HISTORY:  No LMP recorded. Patient is postmenopausal. Menarche: 74 years old Age at first live birth: 74 years old Kenney P 4 LMP perimenopausal at 27, last period at 30 HRT no    SOCIAL HISTORY:  Esthefany is now retired. 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 She lives by herself, no pets. Her husband passed away about 4 years ago with esophageal neuroendocrine cancer. She has four sons who all live in [place] and work at [employer]. Merry Proud was born in [birth year], [place] in [place], Legrand Como in [place] and [name] in 1975. She has 7 grandchildren and 1 great grandchild. She attends a [employer].    ADVANCED DIRECTIVES: [name] is listed first as her power of attorney and Legrand Como is listed second    HEALTH MAINTENANCE: Social History   Tobacco Use  . Smoking status: Never Smoker  . Smokeless tobacco: Never Used  Vaping Use  . Vaping Use: Never used  Substance Use Topics  . Alcohol  use: No    Alcohol/week: 0.0 standard drinks  . Drug use: No     Colonoscopy: 09/26/2017  PAP: Normal, 06/22/2014  Bone density: Osteoporosis   Allergies  Allergen Reactions  . Macrodantin [Nitrofurantoin Macrocrystal]     Liquid form--Can't tolerate  . Phenylephrine     Other reaction(s): Other (See Comments) -- phenylephrine eye drops only  Eye irritation, severe photophobia  . Lisinopril Cough    Current Outpatient Medications  Medication Sig Dispense Refill  . acetaminophen (TYLENOL) 500 MG tablet Take 500 mg by mouth every 6 (six) hours as needed for moderate pain.     . Ascorbic Acid (VITAMIN C PO) Take 250 mg by mouth daily.     Marland Kitchen aspirin 81 MG chewable tablet Chew 1 tablet (81 mg total) by mouth daily.    . Aspirin-Salicylamide-Caffeine (BC HEADACHE PO) Take 1 packet by mouth daily as needed (pain).    Marland Kitchen azaTHIOprine (IMURAN) 50 MG tablet Take 50 mg by mouth daily.     . hydroxychloroquine (PLAQUENIL) 200 MG tablet Take 200 mg by mouth daily.    Marland Kitchen losartan (COZAAR) 50 MG tablet Take 50 mg by mouth daily.     . Magnesium 250 MG TABS Take 250 mg by mouth 2 (two) times daily.    . meloxicam (MOBIC) 7.5 MG tablet Take 7.5 mg by mouth daily.    . Multiple Vitamin (MULTIVITAMIN) tablet Take 1 tablet by mouth daily.    . NONFORMULARY OR COMPOUNDED ITEM Estradiol vaginal cream 7.12% once applicator twice weekly 90 each 4  . predniSONE (DELTASONE) 5 MG tablet Take 1 tablet (5 mg total) by mouth daily with breakfast. (Patient taking differently: Take 7 mg by mouth daily with breakfast. ) 30 tablet 0  . Tetrahydrozoline HCl (VISINE OP) Place 1 drop into both eyes daily as needed (dry eyes). Reported on 05/16/2016    . timolol (BETIMOL) 0.5 % ophthalmic solution Place 1 drop into both eyes at bedtime.    . traMADol (ULTRAM) 50 MG tablet Take 1 tablet (50 mg total) by mouth 4 (four) times daily - after meals and at bedtime. (Patient taking differently: Take 50 mg by mouth every  6 (six)  hours as needed for moderate pain or severe pain. ) 30 tablet 0  . zoledronic acid (RECLAST) 5 MG/100ML SOLN injection Inject 5 mg into the vein once.    . triamterene-hydrochlorothiazide (MAXZIDE-25) 37.5-25 MG tablet Take 1 tablet by mouth daily.     No current facility-administered medications for this visit.    OBJECTIVE: Middle-aged white woman who appears frail  Vitals:   09/01/20 1206  BP: 133/71  Pulse: 64  Resp: 17  Temp: 97.8 F (36.6 C)  SpO2: 99%     Body mass index is 23.22 kg/m.   Wt Readings from Last 3 Encounters:  09/01/20 111 lb 1.6 oz (50.4 kg)  04/12/20 114 lb (51.7 kg)  02/06/19 123 lb (55.8 kg)      ECOG FS:2 - Symptomatic, <50% confined to bed GENERAL: Patient is a well appearing female in no acute distress HEENT:  Sclerae anicteric.  Mask in place.  Neck is supple.  NODES:  No cervical, supraclavicular, or axillary lymphadenopathy palpated.  BREAST EXAM:  Deferred. LUNGS:  Clear to auscultation bilaterally.  No wheezes or rhonchi. HEART:  Regular rate and rhythm. No murmur appreciated. ABDOMEN:  Soft, nontender.  Positive, normoactive bowel sounds. No organomegaly palpated. MSK:  No focal spinal tenderness to palpation. Full range of motion bilaterally in the upper extremities. EXTREMITIES:  No peripheral edema.   SKIN:  Clear with no obvious rashes or skin changes. No nail dyscrasia. NEURO:  Nonfocal. Well oriented.  Appropriate affect.    LAB RESULTS:  CMP     Component Value Date/Time   NA 133 (L) 09/01/2020 1123   NA 136 10/03/2017 1128   K 4.2 09/01/2020 1123   K 4.1 10/03/2017 1128   CL 99 09/01/2020 1123   CL 102 03/14/2013 1039   CO2 26 09/01/2020 1123   CO2 27 10/03/2017 1128   GLUCOSE 89 09/01/2020 1123   GLUCOSE 80 10/03/2017 1128   GLUCOSE 66 (L) 03/14/2013 1039   BUN 19 09/01/2020 1123   BUN 11.8 10/03/2017 1128   CREATININE 0.83 09/01/2020 1123   CREATININE 0.66 04/01/2018 1101   CREATININE 0.7 10/03/2017 1128    CALCIUM 9.7 09/01/2020 1123   CALCIUM 9.7 10/03/2017 1128   PROT 6.8 09/01/2020 1123   PROT 7.6 10/03/2017 1128   ALBUMIN 3.6 09/01/2020 1123   ALBUMIN 3.7 10/03/2017 1128   AST 28 09/01/2020 1123   AST 25 04/01/2018 1101   AST 25 10/03/2017 1128   ALT 9 09/01/2020 1123   ALT 16 04/01/2018 1101   ALT 10 10/03/2017 1128   ALKPHOS 48 09/01/2020 1123   ALKPHOS 77 10/03/2017 1128   BILITOT 0.7 09/01/2020 1123   BILITOT 0.4 04/01/2018 1101   BILITOT 0.46 10/03/2017 1128   GFRNONAA >60 09/01/2020 1123   GFRNONAA >60 04/01/2018 1101   GFRAA >60 09/01/2020 1123   GFRAA >60 04/01/2018 1101    Lab Results  Component Value Date   TOTALPROTELP 6.8 12/16/2009    No results found for: KPAFRELGTCHN, LAMBDASER, KAPLAMBRATIO  Lab Results  Component Value Date   WBC 5.2 09/01/2020   NEUTROABS 4.1 09/01/2020   HGB 10.2 (L) 09/01/2020   HCT 29.6 (L) 09/01/2020   MCV 108.4 (H) 09/01/2020   PLT 187 09/01/2020    '@LASTCHEMISTRY' @  No results found for: LABCA2  No components found for: BJSEGB151  No results for input(s): INR in the last 168 hours.  No results found for: LABCA2  No results found for: VOH607  No results found for: TIW580  No results found for: DXI338  No results found for: CA2729  No components found for: HGQUANT  No results found for: CEA1 / No results found for: CEA1   No results found for: AFPTUMOR  No results found for: CHROMOGRNA  No results found for: PSA1  Appointment on 09/01/2020  Component Date Value Ref Range Status  . Sodium 09/01/2020 133* 135 - 145 mmol/L Final  . Potassium 09/01/2020 4.2  3.5 - 5.1 mmol/L Final  . Chloride 09/01/2020 99  98 - 111 mmol/L Final  . CO2 09/01/2020 26  22 - 32 mmol/L Final  . Glucose, Bld 09/01/2020 89  70 - 99 mg/dL Final   Glucose reference range applies only to samples taken after fasting for at least 8 hours.  . BUN 09/01/2020 19  8 - 23 mg/dL Final  . Creatinine, Ser 09/01/2020 0.83  0.44 - 1.00 mg/dL  Final  . Calcium 09/01/2020 9.7  8.9 - 10.3 mg/dL Final  . Total Protein 09/01/2020 6.8  6.5 - 8.1 g/dL Final  . Albumin 09/01/2020 3.6  3.5 - 5.0 g/dL Final  . AST 09/01/2020 28  15 - 41 U/L Final  . ALT 09/01/2020 9  0 - 44 U/L Final  . Alkaline Phosphatase 09/01/2020 48  38 - 126 U/L Final  . Total Bilirubin 09/01/2020 0.7  0.3 - 1.2 mg/dL Final  . GFR calc non Af Amer 09/01/2020 >60  >60 mL/min Final  . GFR calc Af Amer 09/01/2020 >60  >60 mL/min Final  . Anion gap 09/01/2020 8  5 - 15 Final   Performed at [facility], [place] [address], [place], [place] [address]  . WBC 09/01/2020 5.2  4.0 - 10.5 K/uL Final  . RBC 09/01/2020 2.73* 3.87 - 5.11 MIL/uL Final  . Hemoglobin 09/01/2020 10.2* 12.0 - 15.0 g/dL Final  . HCT 09/01/2020 29.6* 36 - 46 % Final  . MCV 09/01/2020 108.4* 80.0 - 100.0 fL Final  . MCH 09/01/2020 37.4* 26.0 - 34.0 pg Final  . MCHC 09/01/2020 34.5  30.0 - 36.0 g/dL Final  . RDW 09/01/2020 15.3  11.5 - 15.5 % Final  . Platelets 09/01/2020 187  150 - 400 K/uL Final  . nRBC 09/01/2020 0.4* 0.0 - 0.2 % Final  . Neutrophils Relative % 09/01/2020 80  % Final  . Neutro Abs 09/01/2020 4.1  1.7 - 7.7 K/uL Final  . Lymphocytes Relative 09/01/2020 10  % Final  . Lymphs Abs 09/01/2020 0.5* 0.7 - 4.0 K/uL Final  . Monocytes Relative 09/01/2020 7  % Final  . Monocytes Absolute 09/01/2020 0.4  0 - 1 K/uL Final  . Eosinophils Relative 09/01/2020 2  % Final  . Eosinophils Absolute 09/01/2020 0.1  0 - 0 K/uL Final  . Basophils Relative 09/01/2020 0  % Final  . Basophils Absolute 09/01/2020 0.0  0 - 0 K/uL Final  . Immature Granulocytes 09/01/2020 1  % Final  . Abs Immature Granulocytes 09/01/2020 0.03  0.00 - 0.07 K/uL Final   Performed at [facility], [place] [address], [place], [place] [address]    (this displays the last labs from the last 3 days)  Lab Results  Component Value Date   TOTALPROTELP 6.8 12/16/2009   (this  displays SPEP labs)  No results found for: KPAFRELGTCHN, LAMBDASER, KAPLAMBRATIO (kappa/lambda light chains)  No results found for: HGBA, HGBA2QUANT, HGBFQUANT, HGBSQUAN (Hemoglobinopathy evaluation)   Lab Results  Component  Value Date   LDH 236 03/14/2013    Lab Results  Component Value Date   IRON 92 06/21/2018   TIBC 261 06/21/2018   IRONPCTSAT 35 06/21/2018   (Iron and TIBC)  Lab Results  Component Value Date   FERRITIN 177 09/25/2018    Urinalysis    Component Value Date/Time   COLORURINE YELLOW 06/17/2016 1111   APPEARANCEUR CLOUDY (A) 06/17/2016 1111   LABSPEC 1.022 06/17/2016 1111   PHURINE 8.0 06/17/2016 1111   GLUCOSEU NEGATIVE 06/17/2016 1111   HGBUR NEGATIVE 06/17/2016 1111   BILIRUBINUR NEGATIVE 06/17/2016 1111   KETONESUR NEGATIVE 06/17/2016 1111   PROTEINUR NEGATIVE 06/17/2016 1111   UROBILINOGEN 0.2 10/27/2015 1038   NITRITE NEGATIVE 06/17/2016 1111   LEUKOCYTESUR LARGE (A) 06/17/2016 1111     STUDIES: No results found.  ELIGIBLE FOR AVAILABLE RESEARCH PROTOCOL: no  ASSESSMENT: 73 y.o.   (1) iron deficiency anemia:  (a) status post Feraheme x2 October 2018  (2)  chronic inflammation (inclusion body myositis)-- followed by Dr Kathlene November  (3) genetics testing 01/15/2018 through the Common Hereditary Cancer Panel offered by [facility] found no deleterious mutations in APC, ATM, AXIN2, BARD1, BMPR1A, BRCA1, BRCA2, BRIP1, CDH1, CDKN2A (p14ARF), CDKN2A (p16INK4a), CKD4, CHEK2, CTNNA1, DICER1, EPCAM (Deletion/duplication testing only), GREM1 (promoter region deletion/duplication testing only), KIT, MEN1, MLH1, MSH2, MSH3, MSH6, MUTYH, NBN, NF1, NHTL1, PALB2, PDGFRA, PMS2, POLD1, POLE, PTEN, RAD50, RAD51C, RAD51D, SDHB, SDHC, SDHD, SMAD4, SMARCA4. STK11, TP53, TSC1, TSC2, and VHL.  The following genes were evaluated for sequence changes only: SDHA and HOXB13 c.251G>A variant only.   PLAN: Vaughan Basta is here for her follow up of anemia.  Her hemoglobin is  slightly worse today, at 10.2.  Her MCV is also increased.  Her ferritin is normal, so I asked her to go back to lab and have a b12 and folate drawn to fully evaluate for a nutritional anemia, since her diet has decreased some.  I let her and her son know that some rheumatologic medications can cause a macrocytosis.    Regardless, we will keep a closer eye on her labs since there is a decline.  She will return every 3 months for lab only and in one year for f/u.  They are satisfied with this plan.  We discussed her dietary intake, and I recommended she work on foods that can help with her intake and prevent continued weight loss.  If it continues she may consider f/u with GI again.    The above was reviewed with Dr. Jana Hakim in detail who is in agreement with the management plan.  She knows to call for any questions that may arise between now and her next appointment.  We are happy to see her sooner if needed.  Total encounter time: 30 minutes*  Wilber Bihari, NP 09/01/20 12:29 PM Medical Oncology and Hematology [facility] [place], [place] [address] Tel. [phone]    Fax. [phone]  *Total Encounter Time as defined by the Centers for Medicare and Medicaid Services includes, in addition to the face-to-face time of a patient visit (documented in the note above) non-face-to-face time: obtaining and reviewing outside history, ordering and reviewing medications, tests or procedures, care coordination (communications with other health care professionals or caregivers) and documentation in the medical record.

## 2020-09-02 ENCOUNTER — Telehealth: Payer: Self-pay

## 2020-09-02 ENCOUNTER — Telehealth: Payer: Self-pay | Admitting: Oncology

## 2020-09-02 NOTE — Telephone Encounter (Signed)
Called and given below message. She verbalized understanding. 

## 2020-09-02 NOTE — Telephone Encounter (Signed)
Scheduled appts per 9/15 los. Pt confirmed appt dates and times.

## 2020-09-02 NOTE — Telephone Encounter (Signed)
-----   Message from Gardenia Phlegm, NP sent at 09/02/2020 10:05 AM EDT ----- Please let patient know that labs to look at folate and b12 are normal.  Good news.  ----- Message ----- From: Buel Ream, Lab In [place] Sent: 09/01/2020   1:17 PM EDT To: Gardenia Phlegm, NP

## 2020-09-08 DIAGNOSIS — H401122 Primary open-angle glaucoma, left eye, moderate stage: Secondary | ICD-10-CM | POA: Diagnosis not present

## 2020-09-08 DIAGNOSIS — H401112 Primary open-angle glaucoma, right eye, moderate stage: Secondary | ICD-10-CM | POA: Diagnosis not present

## 2020-09-16 DIAGNOSIS — I10 Essential (primary) hypertension: Secondary | ICD-10-CM | POA: Diagnosis not present

## 2020-09-16 DIAGNOSIS — Z0000 Encounter for general adult medical examination without abnormal findings: Secondary | ICD-10-CM | POA: Diagnosis not present

## 2020-09-16 DIAGNOSIS — E7800 Pure hypercholesterolemia, unspecified: Secondary | ICD-10-CM | POA: Diagnosis not present

## 2020-09-16 DIAGNOSIS — M810 Age-related osteoporosis without current pathological fracture: Secondary | ICD-10-CM | POA: Diagnosis not present

## 2020-09-17 DIAGNOSIS — Z79899 Other long term (current) drug therapy: Secondary | ICD-10-CM | POA: Diagnosis not present

## 2020-09-17 DIAGNOSIS — M609 Myositis, unspecified: Secondary | ICD-10-CM | POA: Diagnosis not present

## 2020-09-17 DIAGNOSIS — D509 Iron deficiency anemia, unspecified: Secondary | ICD-10-CM | POA: Diagnosis not present

## 2020-09-17 DIAGNOSIS — R238 Other skin changes: Secondary | ICD-10-CM | POA: Diagnosis not present

## 2020-09-17 DIAGNOSIS — D649 Anemia, unspecified: Secondary | ICD-10-CM | POA: Diagnosis not present

## 2020-09-17 DIAGNOSIS — I110 Hypertensive heart disease with heart failure: Secondary | ICD-10-CM | POA: Diagnosis not present

## 2020-09-21 DIAGNOSIS — Z23 Encounter for immunization: Secondary | ICD-10-CM | POA: Diagnosis not present

## 2020-09-21 DIAGNOSIS — G7241 Inclusion body myositis [IBM]: Secondary | ICD-10-CM | POA: Diagnosis not present

## 2020-09-21 DIAGNOSIS — Z0000 Encounter for general adult medical examination without abnormal findings: Secondary | ICD-10-CM | POA: Diagnosis not present

## 2020-09-21 DIAGNOSIS — I10 Essential (primary) hypertension: Secondary | ICD-10-CM | POA: Diagnosis not present

## 2020-09-21 DIAGNOSIS — R222 Localized swelling, mass and lump, trunk: Secondary | ICD-10-CM | POA: Diagnosis not present

## 2020-09-21 DIAGNOSIS — M810 Age-related osteoporosis without current pathological fracture: Secondary | ICD-10-CM | POA: Diagnosis not present

## 2020-09-21 DIAGNOSIS — E039 Hypothyroidism, unspecified: Secondary | ICD-10-CM | POA: Diagnosis not present

## 2020-09-21 DIAGNOSIS — N390 Urinary tract infection, site not specified: Secondary | ICD-10-CM | POA: Diagnosis not present

## 2020-09-23 DIAGNOSIS — R222 Localized swelling, mass and lump, trunk: Secondary | ICD-10-CM | POA: Diagnosis not present

## 2020-10-15 ENCOUNTER — Other Ambulatory Visit: Payer: Self-pay | Admitting: Internal Medicine

## 2020-10-15 DIAGNOSIS — Z1231 Encounter for screening mammogram for malignant neoplasm of breast: Secondary | ICD-10-CM

## 2020-11-22 ENCOUNTER — Telehealth (HOSPITAL_COMMUNITY): Payer: Self-pay | Admitting: Nurse Practitioner

## 2020-11-22 ENCOUNTER — Other Ambulatory Visit: Payer: Self-pay | Admitting: Nurse Practitioner

## 2020-11-22 DIAGNOSIS — U071 COVID-19: Secondary | ICD-10-CM

## 2020-11-22 DIAGNOSIS — I10 Essential (primary) hypertension: Secondary | ICD-10-CM

## 2020-11-22 DIAGNOSIS — M069 Rheumatoid arthritis, unspecified: Secondary | ICD-10-CM

## 2020-11-22 NOTE — Progress Notes (Signed)
I connected by phone with [name] on 11/22/2020 at 5:41 PM to discuss the potential use of a new treatment for mild to moderate COVID-19 viral infection in non-hospitalized patients.  This patient is a 74 y.o. female that meets the FDA criteria for Emergency Use Authorization of COVID monoclonal antibody casirivimab/imdevimab, bamlanivimab/eteseviamb, or sotrovimab.  Has a (+) direct SARS-CoV-2 viral test result  Has mild or moderate COVID-19   Is NOT hospitalized due to COVID-19  Is within 10 days of symptom onset  Has at least one of the high risk factor(s) for progression to severe COVID-19 and/or hospitalization as defined in EUA.  Specific high risk criteria : Older age (>/= 73 yo), BMI > 25, Immunosuppressive Disease or Treatment and Cardiovascular disease or hypertension   I have spoken and communicated the following to the patient or parent/caregiver regarding COVID monoclonal antibody treatment:  1. FDA has authorized the emergency use for the treatment of mild to moderate COVID-19 in adults and pediatric patients with positive results of direct SARS-CoV-2 viral testing who are 4 years of age and older weighing at least 40 kg, and who are at high risk for progressing to severe COVID-19 and/or hospitalization.  2. The significant known and potential risks and benefits of COVID monoclonal antibody, and the extent to which such potential risks and benefits are unknown.  3. Information on available alternative treatments and the risks and benefits of those alternatives, including clinical trials.  4. Patients treated with COVID monoclonal antibody should continue to self-isolate and use infection control measures (e.g., wear mask, isolate, social distance, avoid sharing personal items, clean and disinfect "high touch" surfaces, and frequent handwashing) according to CDC guidelines.   5. The patient or parent/caregiver has the option to accept or refuse COVID monoclonal  antibody treatment.  After reviewing this information with the patient, the patient has agreed to receive one of the available covid 19 monoclonal antibodies and will be provided an appropriate fact sheet prior to infusion. Jobe Gibbon, NP 11/22/2020 5:41 PM

## 2020-11-22 NOTE — Telephone Encounter (Addendum)
Called patient's son, [name], to discuss with patient about Covid symptoms and the use of the monoclonal antibody infusion for those with mild to moderate Covid symptoms and at a high risk of hospitalization.     Pt appears to qualify for this infusion due to co-morbid conditions and/or a member of an at-risk group in accordance with the FDA Emergency Use Authorization.    Risk factors include: 74 yo, RA, Hypertension    Symptom onset: 11/16/20 (current sxs of congestion, fatigue, cough and "low-grade" fever; diarrhea x 1)   Tested positive for COVID 19: 11/21/20  Note: Patient's son, [name], would like to be called by APP.  [name] states that his mother was not vaccinated.  Dr. Shelia Media at [facility] called [name] today, 11/22/20, and advised that she have the monoclonal antibody infusion.   Discussed information regarding costs of monoclonal antibody treatment, given both CPT & REV codes, and encouraged patient to call their health insurance company to verify cost of treatment that patient will be financially responsible for.  Patient aware they will receive a call from APP for further information and to schedule appointment. All questions answered.   Gave patient information the address to clinic, [address] and phone number to call once patient arrives, [phone].

## 2020-11-23 ENCOUNTER — Ambulatory Visit (HOSPITAL_COMMUNITY)
Admission: RE | Admit: 2020-11-23 | Discharge: 2020-11-23 | Disposition: A | Discharge status: Home / Self Care | Payer: Medicare Other | Source: Ambulatory Visit | Attending: Pulmonary Disease | Admitting: Pulmonary Disease

## 2020-11-23 DIAGNOSIS — M069 Rheumatoid arthritis, unspecified: Secondary | ICD-10-CM | POA: Insufficient documentation

## 2020-11-23 DIAGNOSIS — Z23 Encounter for immunization: Secondary | ICD-10-CM | POA: Diagnosis not present

## 2020-11-23 DIAGNOSIS — U071 COVID-19: Secondary | ICD-10-CM

## 2020-11-23 DIAGNOSIS — I10 Essential (primary) hypertension: Secondary | ICD-10-CM | POA: Diagnosis present

## 2020-11-23 MED ORDER — SODIUM CHLORIDE 0.9 % IV SOLN: Freq: Once | INTRAVENOUS | Status: AC

## 2020-11-23 MED ORDER — SODIUM CHLORIDE 0.9 % IV SOLN: INTRAVENOUS | Status: DC | PRN

## 2020-11-23 MED ORDER — ALBUTEROL SULFATE HFA 108 (90 BASE) MCG/ACT IN AERS: 2.0000 | INHALATION_SPRAY | Freq: Once | RESPIRATORY_TRACT | Status: DC | PRN

## 2020-11-23 MED ORDER — DIPHENHYDRAMINE HCL 50 MG/ML IJ SOLN: 50.0000 mg | Freq: Once | INTRAMUSCULAR | Status: DC | PRN

## 2020-11-23 MED ORDER — FAMOTIDINE IN NACL 20-0.9 MG/50ML-% IV SOLN: 20.0000 mg | Freq: Once | INTRAVENOUS | Status: DC | PRN

## 2020-11-23 MED ORDER — EPINEPHRINE 0.3 MG/0.3ML IJ SOAJ: 0.3000 mg | Freq: Once | INTRAMUSCULAR | Status: DC | PRN

## 2020-11-23 MED ORDER — METHYLPREDNISOLONE SODIUM SUCC 125 MG IJ SOLR: 125.0000 mg | Freq: Once | INTRAMUSCULAR | Status: DC | PRN

## 2020-11-23 NOTE — Progress Notes (Signed)
Patient reviewed Fact Sheet for Patients, Parents, and Caregivers for Emergency Use Authorization (EUA) of Sotrovimab for the Treatment of Coronavirus. Patient also reviewed and is agreeable to the estimated cost of treatment. Patient is agreeable to proceed.   

## 2020-11-23 NOTE — Discharge Instructions (Signed)
10 Things You Can Do to Manage Your COVID-19 Symptoms at Home If you have possible or confirmed COVID-19: 1. Stay home from work and school. And stay away from other public places. If you must go out, avoid using any kind of public transportation, ridesharing, or taxis. 2. Monitor your symptoms carefully. If your symptoms get worse, call your healthcare provider immediately. 3. Get rest and stay hydrated. 4. If you have a medical appointment, call the healthcare provider ahead of time and tell them that you have or may have COVID-19. 5. For medical emergencies, call 911 and notify the dispatch personnel that you have or may have COVID-19. 6. Cover your cough and sneezes with a tissue or use the inside of your elbow. 7. Wash your hands often with soap and water for at least 20 seconds or clean your hands with an alcohol-based hand sanitizer that contains at least 60% alcohol. 8. As much as possible, stay in a specific room and away from other people in your home. Also, you should use a separate bathroom, if available. If you need to be around other people in or outside of the home, wear a mask. 9. Avoid sharing personal items with other people in your household, like dishes, towels, and bedding. 10. Clean all surfaces that are touched often, like counters, tabletops, and doorknobs. Use household cleaning sprays or wipes according to the label instructions. cdc.gov/coronavirus 06/18/2019 This information is not intended to replace advice given to you by your health care provider. Make sure you discuss any questions you have with your health care provider. Document Revised: 11/20/2019 Document Reviewed: 11/20/2019 Elsevier Patient Education  2020 Elsevier Inc. What types of side effects do monoclonal antibody drugs cause?  Common side effects  In general, the more common side effects caused by monoclonal antibody drugs include: . Allergic reactions, such as hives or itching . Flu-like signs and  symptoms, including chills, fatigue, fever, and muscle aches and pains . Nausea, vomiting . Diarrhea . Skin rashes . Low blood pressure   The CDC is recommending patients who receive monoclonal antibody treatments wait at least 90 days before being vaccinated.  Currently, there are no data on the safety and efficacy of mRNA COVID-19 vaccines in persons who received monoclonal antibodies or convalescent plasma as part of COVID-19 treatment. Based on the estimated half-life of such therapies as well as evidence suggesting that reinfection is uncommon in the 90 days after initial infection, vaccination should be deferred for at least 90 days, as a precautionary measure until additional information becomes available, to avoid interference of the antibody treatment with vaccine-induced immune responses. If you have any questions or concerns after the infusion please call the Advanced Practice Provider on call at [phone]. This number is ONLY intended for your use regarding questions or concerns about the infusion post-treatment side-effects.  Please do not provide this number to others for use. For return to work notes please contact your primary care provider.   If someone you know is interested in receiving treatment please have them call the COVID hotline at [phone].   

## 2020-11-25 ENCOUNTER — Ambulatory Visit: Payer: Medicare HMO

## 2020-11-25 ENCOUNTER — Ambulatory Visit (HOSPITAL_COMMUNITY): Payer: Medicare HMO

## 2020-12-01 ENCOUNTER — Inpatient Hospital Stay: Payer: Medicare HMO

## 2020-12-02 NOTE — Telephone Encounter (Signed)
No telephone encounter to report; closing note.

## 2020-12-23 DIAGNOSIS — N3941 Urge incontinence: Secondary | ICD-10-CM | POA: Diagnosis not present

## 2020-12-23 DIAGNOSIS — N139 Obstructive and reflux uropathy, unspecified: Secondary | ICD-10-CM | POA: Diagnosis not present

## 2020-12-23 DIAGNOSIS — R8271 Bacteriuria: Secondary | ICD-10-CM | POA: Diagnosis not present

## 2020-12-29 ENCOUNTER — Other Ambulatory Visit: Payer: Medicare HMO

## 2021-01-04 DIAGNOSIS — R600 Localized edema: Secondary | ICD-10-CM | POA: Diagnosis not present

## 2021-01-04 DIAGNOSIS — Z79899 Other long term (current) drug therapy: Secondary | ICD-10-CM | POA: Diagnosis not present

## 2021-01-04 DIAGNOSIS — M3500 Sicca syndrome, unspecified: Secondary | ICD-10-CM | POA: Diagnosis not present

## 2021-01-04 DIAGNOSIS — K219 Gastro-esophageal reflux disease without esophagitis: Secondary | ICD-10-CM | POA: Diagnosis not present

## 2021-01-04 DIAGNOSIS — M810 Age-related osteoporosis without current pathological fracture: Secondary | ICD-10-CM | POA: Diagnosis not present

## 2021-01-04 DIAGNOSIS — M609 Myositis, unspecified: Secondary | ICD-10-CM | POA: Diagnosis not present

## 2021-01-04 DIAGNOSIS — R69 Illness, unspecified: Secondary | ICD-10-CM | POA: Diagnosis not present

## 2021-01-04 DIAGNOSIS — M159 Polyosteoarthritis, unspecified: Secondary | ICD-10-CM | POA: Diagnosis not present

## 2021-01-04 DIAGNOSIS — G7241 Inclusion body myositis [IBM]: Secondary | ICD-10-CM | POA: Diagnosis not present

## 2021-01-04 DIAGNOSIS — I10 Essential (primary) hypertension: Secondary | ICD-10-CM | POA: Diagnosis not present

## 2021-01-04 DIAGNOSIS — M3320 Polymyositis, organ involvement unspecified: Secondary | ICD-10-CM | POA: Diagnosis not present

## 2021-01-05 ENCOUNTER — Ambulatory Visit: Payer: Medicare HMO

## 2021-01-11 ENCOUNTER — Other Ambulatory Visit: Payer: Self-pay

## 2021-01-11 ENCOUNTER — Inpatient Hospital Stay: Payer: Medicare HMO | Attending: Adult Health

## 2021-01-11 DIAGNOSIS — Z8041 Family history of malignant neoplasm of ovary: Secondary | ICD-10-CM | POA: Insufficient documentation

## 2021-01-11 DIAGNOSIS — D508 Other iron deficiency anemias: Secondary | ICD-10-CM | POA: Insufficient documentation

## 2021-01-11 DIAGNOSIS — I10 Essential (primary) hypertension: Secondary | ICD-10-CM | POA: Diagnosis not present

## 2021-01-11 DIAGNOSIS — Z79899 Other long term (current) drug therapy: Secondary | ICD-10-CM | POA: Insufficient documentation

## 2021-01-11 DIAGNOSIS — Z7982 Long term (current) use of aspirin: Secondary | ICD-10-CM | POA: Diagnosis not present

## 2021-01-11 DIAGNOSIS — Z803 Family history of malignant neoplasm of breast: Secondary | ICD-10-CM | POA: Insufficient documentation

## 2021-01-11 DIAGNOSIS — Z801 Family history of malignant neoplasm of trachea, bronchus and lung: Secondary | ICD-10-CM | POA: Diagnosis not present

## 2021-01-11 DIAGNOSIS — D519 Vitamin B12 deficiency anemia, unspecified: Secondary | ICD-10-CM

## 2021-01-11 LAB — CBC WITH DIFFERENTIAL/PLATELET
Abs Immature Granulocytes: 0.02 10*3/uL (ref 0.00–0.07)
Basophils Absolute: 0 10*3/uL (ref 0.0–0.1)
Basophils Relative: 1 %
Eosinophils Absolute: 0.1 10*3/uL (ref 0.0–0.5)
Eosinophils Relative: 3 %
HCT: 30.4 % — ABNORMAL LOW (ref 36.0–46.0)
Hemoglobin: 10.2 g/dL — ABNORMAL LOW (ref 12.0–15.0)
Immature Granulocytes: 1 %
Lymphocytes Relative: 18 %
Lymphs Abs: 0.8 10*3/uL (ref 0.7–4.0)
MCH: 37 pg — ABNORMAL HIGH (ref 26.0–34.0)
MCHC: 33.6 g/dL (ref 30.0–36.0)
MCV: 110.1 fL — ABNORMAL HIGH (ref 80.0–100.0)
Monocytes Absolute: 0.5 10*3/uL (ref 0.1–1.0)
Monocytes Relative: 11 %
Neutro Abs: 2.9 10*3/uL (ref 1.7–7.7)
Neutrophils Relative %: 66 %
Platelets: 251 10*3/uL (ref 150–400)
RBC: 2.76 MIL/uL — ABNORMAL LOW (ref 3.87–5.11)
RDW: 15.9 % — ABNORMAL HIGH (ref 11.5–15.5)
WBC: 4.4 10*3/uL (ref 4.0–10.5)
nRBC: 0.9 % — ABNORMAL HIGH (ref 0.0–0.2)

## 2021-01-11 LAB — COMPREHENSIVE METABOLIC PANEL
ALT: 9 U/L (ref 0–44)
AST: 24 U/L (ref 15–41)
Albumin: 3.9 g/dL (ref 3.5–5.0)
Alkaline Phosphatase: 35 U/L — ABNORMAL LOW (ref 38–126)
Anion gap: 7 (ref 5–15)
BUN: 24 mg/dL — ABNORMAL HIGH (ref 8–23)
CO2: 26 mmol/L (ref 22–32)
Calcium: 9.4 mg/dL (ref 8.9–10.3)
Chloride: 99 mmol/L (ref 98–111)
Creatinine, Ser: 0.77 mg/dL (ref 0.44–1.00)
GFR, Estimated: >60 mL/min (ref >60)
Glucose, Bld: 86 mg/dL (ref 70–99)
Potassium: 3.9 mmol/L (ref 3.5–5.1)
Sodium: 132 mmol/L — ABNORMAL LOW (ref 135–145)
Total Bilirubin: 0.7 mg/dL (ref 0.3–1.2)
Total Protein: 6.7 g/dL (ref 6.5–8.1)

## 2021-01-11 LAB — RETIC PANEL
Immature Retic Fract: 10.9 % (ref 2.3–15.9)
RBC.: 2.68 MIL/uL — ABNORMAL LOW (ref 3.87–5.11)
Retic Count, Absolute: 54.7 10*3/uL (ref 19.0–186.0)
Retic Ct Pct: 2 % (ref 0.4–3.1)
Reticulocyte Hemoglobin: 43.3 pg (ref >27.9)

## 2021-01-11 LAB — IRON AND TIBC
Iron: 122 ug/dL (ref 41–142)
Saturation Ratios: 50 % (ref 21–57)
TIBC: 246 ug/dL (ref 236–444)
UIBC: 124 ug/dL (ref 120–384)

## 2021-01-11 LAB — FERRITIN: Ferritin: 343 ng/mL — ABNORMAL HIGH (ref 11–307)

## 2021-01-11 LAB — SAVE SMEAR(SSMR), FOR PROVIDER SLIDE REVIEW

## 2021-01-21 DIAGNOSIS — H401132 Primary open-angle glaucoma, bilateral, moderate stage: Secondary | ICD-10-CM | POA: Diagnosis not present

## 2021-01-31 DIAGNOSIS — H401122 Primary open-angle glaucoma, left eye, moderate stage: Secondary | ICD-10-CM | POA: Diagnosis not present

## 2021-01-31 DIAGNOSIS — H401112 Primary open-angle glaucoma, right eye, moderate stage: Secondary | ICD-10-CM | POA: Diagnosis not present

## 2021-02-11 DIAGNOSIS — Z79899 Other long term (current) drug therapy: Secondary | ICD-10-CM | POA: Diagnosis not present

## 2021-02-11 DIAGNOSIS — E871 Hypo-osmolality and hyponatremia: Secondary | ICD-10-CM | POA: Diagnosis not present

## 2021-02-11 DIAGNOSIS — I10 Essential (primary) hypertension: Secondary | ICD-10-CM | POA: Diagnosis not present

## 2021-02-17 DIAGNOSIS — M810 Age-related osteoporosis without current pathological fracture: Secondary | ICD-10-CM | POA: Diagnosis not present

## 2021-02-22 ENCOUNTER — Other Ambulatory Visit: Payer: Self-pay

## 2021-02-22 ENCOUNTER — Ambulatory Visit
Admission: RE | Admit: 2021-02-22 | Discharge: 2021-02-22 | Disposition: A | Discharge status: Home / Self Care | Payer: Medicare HMO | Source: Ambulatory Visit | Attending: Internal Medicine | Admitting: Internal Medicine

## 2021-02-22 DIAGNOSIS — Z1231 Encounter for screening mammogram for malignant neoplasm of breast: Secondary | ICD-10-CM | POA: Diagnosis not present

## 2021-02-24 DIAGNOSIS — E871 Hypo-osmolality and hyponatremia: Secondary | ICD-10-CM | POA: Diagnosis not present

## 2021-02-24 DIAGNOSIS — R600 Localized edema: Secondary | ICD-10-CM | POA: Diagnosis not present

## 2021-03-01 ENCOUNTER — Inpatient Hospital Stay: Payer: Medicare HMO

## 2021-03-01 ENCOUNTER — Telehealth: Payer: Self-pay | Admitting: Oncology

## 2021-03-01 NOTE — Telephone Encounter (Signed)
Rescheduled 03/15 appointment to 03/17 per patient's request. Patient is notified of upcoming appointment.

## 2021-03-03 ENCOUNTER — Inpatient Hospital Stay: Payer: Medicare HMO | Attending: Adult Health

## 2021-03-03 ENCOUNTER — Other Ambulatory Visit: Payer: Self-pay

## 2021-03-03 DIAGNOSIS — D508 Other iron deficiency anemias: Secondary | ICD-10-CM | POA: Insufficient documentation

## 2021-03-03 DIAGNOSIS — D519 Vitamin B12 deficiency anemia, unspecified: Secondary | ICD-10-CM

## 2021-03-03 LAB — COMPREHENSIVE METABOLIC PANEL
ALT: 10 U/L (ref 0–44)
AST: 23 U/L (ref 15–41)
Albumin: 3.7 g/dL (ref 3.5–5.0)
Alkaline Phosphatase: 39 U/L (ref 38–126)
Anion gap: 7 (ref 5–15)
BUN: 28 mg/dL — ABNORMAL HIGH (ref 8–23)
CO2: 27 mmol/L (ref 22–32)
Calcium: 9.3 mg/dL (ref 8.9–10.3)
Chloride: 102 mmol/L (ref 98–111)
Creatinine, Ser: 0.8 mg/dL (ref 0.44–1.00)
GFR, Estimated: >60 mL/min (ref >60)
Glucose, Bld: 103 mg/dL — ABNORMAL HIGH (ref 70–99)
Potassium: 4.4 mmol/L (ref 3.5–5.1)
Sodium: 136 mmol/L (ref 135–145)
Total Bilirubin: 0.5 mg/dL (ref 0.3–1.2)
Total Protein: 6.3 g/dL — ABNORMAL LOW (ref 6.5–8.1)

## 2021-03-03 LAB — CBC WITH DIFFERENTIAL/PLATELET
Abs Immature Granulocytes: 0.01 10*3/uL (ref 0.00–0.07)
Basophils Absolute: 0 10*3/uL (ref 0.0–0.1)
Basophils Relative: 1 %
Eosinophils Absolute: 0.1 10*3/uL (ref 0.0–0.5)
Eosinophils Relative: 2 %
HCT: 30.4 % — ABNORMAL LOW (ref 36.0–46.0)
Hemoglobin: 10.3 g/dL — ABNORMAL LOW (ref 12.0–15.0)
Immature Granulocytes: 0 %
Lymphocytes Relative: 13 %
Lymphs Abs: 0.6 10*3/uL — ABNORMAL LOW (ref 0.7–4.0)
MCH: 38.6 pg — ABNORMAL HIGH (ref 26.0–34.0)
MCHC: 33.9 g/dL (ref 30.0–36.0)
MCV: 113.9 fL — ABNORMAL HIGH (ref 80.0–100.0)
Monocytes Absolute: 0.3 10*3/uL (ref 0.1–1.0)
Monocytes Relative: 7 %
Neutro Abs: 3.2 10*3/uL (ref 1.7–7.7)
Neutrophils Relative %: 77 %
Platelets: 218 10*3/uL (ref 150–400)
RBC: 2.67 MIL/uL — ABNORMAL LOW (ref 3.87–5.11)
RDW: 15.9 % — ABNORMAL HIGH (ref 11.5–15.5)
WBC: 4.2 10*3/uL (ref 4.0–10.5)
nRBC: 0.5 % — ABNORMAL HIGH (ref 0.0–0.2)

## 2021-03-03 LAB — IRON AND TIBC
Iron: 122 ug/dL (ref 41–142)
Saturation Ratios: 48 % (ref 21–57)
TIBC: 256 ug/dL (ref 236–444)
UIBC: 134 ug/dL (ref 120–384)

## 2021-03-03 LAB — RETIC PANEL
Immature Retic Fract: 10.9 % (ref 2.3–15.9)
RBC.: 2.63 MIL/uL — ABNORMAL LOW (ref 3.87–5.11)
Retic Count, Absolute: 46 10*3/uL (ref 19.0–186.0)
Retic Ct Pct: 1.8 % (ref 0.4–3.1)
Reticulocyte Hemoglobin: 40.8 pg (ref >27.9)

## 2021-03-03 LAB — FERRITIN: Ferritin: 212 ng/mL (ref 11–307)

## 2021-03-03 LAB — SAVE SMEAR(SSMR), FOR PROVIDER SLIDE REVIEW

## 2021-03-22 DIAGNOSIS — E871 Hypo-osmolality and hyponatremia: Secondary | ICD-10-CM | POA: Diagnosis not present

## 2021-03-22 DIAGNOSIS — I872 Venous insufficiency (chronic) (peripheral): Secondary | ICD-10-CM | POA: Diagnosis not present

## 2021-03-22 DIAGNOSIS — I10 Essential (primary) hypertension: Secondary | ICD-10-CM | POA: Diagnosis not present

## 2021-03-22 DIAGNOSIS — E039 Hypothyroidism, unspecified: Secondary | ICD-10-CM | POA: Diagnosis not present

## 2021-05-20 DIAGNOSIS — H401132 Primary open-angle glaucoma, bilateral, moderate stage: Secondary | ICD-10-CM | POA: Diagnosis not present

## 2021-05-23 DIAGNOSIS — M609 Myositis, unspecified: Secondary | ICD-10-CM | POA: Diagnosis not present

## 2021-05-23 DIAGNOSIS — G7241 Inclusion body myositis [IBM]: Secondary | ICD-10-CM | POA: Diagnosis not present

## 2021-05-23 DIAGNOSIS — K219 Gastro-esophageal reflux disease without esophagitis: Secondary | ICD-10-CM | POA: Diagnosis not present

## 2021-05-23 DIAGNOSIS — Z79899 Other long term (current) drug therapy: Secondary | ICD-10-CM | POA: Diagnosis not present

## 2021-05-23 DIAGNOSIS — M810 Age-related osteoporosis without current pathological fracture: Secondary | ICD-10-CM | POA: Diagnosis not present

## 2021-05-23 DIAGNOSIS — M159 Polyosteoarthritis, unspecified: Secondary | ICD-10-CM | POA: Diagnosis not present

## 2021-05-23 DIAGNOSIS — M3500 Sicca syndrome, unspecified: Secondary | ICD-10-CM | POA: Diagnosis not present

## 2021-05-23 DIAGNOSIS — R600 Localized edema: Secondary | ICD-10-CM | POA: Diagnosis not present

## 2021-05-23 DIAGNOSIS — R69 Illness, unspecified: Secondary | ICD-10-CM | POA: Diagnosis not present

## 2021-06-01 ENCOUNTER — Other Ambulatory Visit: Payer: Self-pay | Admitting: Adult Health

## 2021-06-01 ENCOUNTER — Inpatient Hospital Stay: Payer: Medicare HMO | Attending: Adult Health

## 2021-06-01 ENCOUNTER — Other Ambulatory Visit: Payer: Self-pay

## 2021-06-01 DIAGNOSIS — D519 Vitamin B12 deficiency anemia, unspecified: Secondary | ICD-10-CM

## 2021-06-01 DIAGNOSIS — D509 Iron deficiency anemia, unspecified: Secondary | ICD-10-CM | POA: Insufficient documentation

## 2021-06-01 LAB — COMPREHENSIVE METABOLIC PANEL
ALT: 10 U/L (ref 0–44)
AST: 21 U/L (ref 15–41)
Albumin: 3.8 g/dL (ref 3.5–5.0)
Alkaline Phosphatase: 39 U/L (ref 38–126)
Anion gap: 8 (ref 5–15)
BUN: 29 mg/dL — ABNORMAL HIGH (ref 8–23)
CO2: 28 mmol/L (ref 22–32)
Calcium: 9.5 mg/dL (ref 8.9–10.3)
Chloride: 102 mmol/L (ref 98–111)
Creatinine, Ser: 0.72 mg/dL (ref 0.44–1.00)
GFR, Estimated: >60 mL/min (ref >60)
Glucose, Bld: 96 mg/dL (ref 70–99)
Potassium: 4.8 mmol/L (ref 3.5–5.1)
Sodium: 138 mmol/L (ref 135–145)
Total Bilirubin: 0.6 mg/dL (ref 0.3–1.2)
Total Protein: 6.6 g/dL (ref 6.5–8.1)

## 2021-06-01 LAB — CBC WITH DIFFERENTIAL/PLATELET
Abs Immature Granulocytes: 0.03 10*3/uL (ref 0.00–0.07)
Basophils Absolute: 0 10*3/uL (ref 0.0–0.1)
Basophils Relative: 0 %
Eosinophils Absolute: 0 10*3/uL (ref 0.0–0.5)
Eosinophils Relative: 0 %
HCT: 33.8 % — ABNORMAL LOW (ref 36.0–46.0)
Hemoglobin: 11.4 g/dL — ABNORMAL LOW (ref 12.0–15.0)
Immature Granulocytes: 1 %
Lymphocytes Relative: 15 %
Lymphs Abs: 0.7 10*3/uL (ref 0.7–4.0)
MCH: 38.1 pg — ABNORMAL HIGH (ref 26.0–34.0)
MCHC: 33.7 g/dL (ref 30.0–36.0)
MCV: 113 fL — ABNORMAL HIGH (ref 80.0–100.0)
Monocytes Absolute: 0.2 10*3/uL (ref 0.1–1.0)
Monocytes Relative: 5 %
Neutro Abs: 3.8 10*3/uL (ref 1.7–7.7)
Neutrophils Relative %: 79 %
Platelets: 218 10*3/uL (ref 150–400)
RBC: 2.99 MIL/uL — ABNORMAL LOW (ref 3.87–5.11)
RDW: 14.8 % (ref 11.5–15.5)
WBC: 4.8 10*3/uL (ref 4.0–10.5)
nRBC: 0 % (ref 0.0–0.2)

## 2021-06-01 LAB — IRON AND TIBC
Iron: 107 ug/dL (ref 41–142)
Saturation Ratios: 41 % (ref 21–57)
TIBC: 261 ug/dL (ref 236–444)
UIBC: 155 ug/dL (ref 120–384)

## 2021-06-01 LAB — RETIC PANEL
Immature Retic Fract: 13.5 % (ref 2.3–15.9)
RBC.: 2.9 MIL/uL — ABNORMAL LOW (ref 3.87–5.11)
Retic Count, Absolute: 55.4 10*3/uL (ref 19.0–186.0)
Retic Ct Pct: 1.9 % (ref 0.4–3.1)
Reticulocyte Hemoglobin: 39.9 pg (ref >27.9)

## 2021-06-01 LAB — FERRITIN: Ferritin: 110 ng/mL (ref 11–307)

## 2021-06-01 LAB — SAVE SMEAR(SSMR), FOR PROVIDER SLIDE REVIEW

## 2021-06-01 NOTE — Progress Notes (Signed)
Reviewed lab orders to ensure enough lab orders left until next appt, which there are.  Wilber Bihari, NP

## 2021-06-02 ENCOUNTER — Telehealth: Payer: Self-pay

## 2021-06-02 NOTE — Telephone Encounter (Signed)
Pt given results per below. All questions answered. Pt verbalized thanks and understanding.

## 2021-06-02 NOTE — Telephone Encounter (Signed)
-----   Message from Gardenia Phlegm, NP sent at 06/01/2021  2:35 PM EDT ----- Hemoglobin is improved.  Other labs are stable.  We will continue to monitor.  Please let patient know.  Thanks, LC ----- Message ----- From: Buel Ream, [facility] [place] Sent: 06/01/2021  12:27 PM EDT To: Gardenia Phlegm, NP

## 2021-06-06 DIAGNOSIS — H401122 Primary open-angle glaucoma, left eye, moderate stage: Secondary | ICD-10-CM | POA: Diagnosis not present

## 2021-06-06 DIAGNOSIS — H401112 Primary open-angle glaucoma, right eye, moderate stage: Secondary | ICD-10-CM | POA: Diagnosis not present

## 2021-08-23 DIAGNOSIS — R600 Localized edema: Secondary | ICD-10-CM | POA: Diagnosis not present

## 2021-08-23 DIAGNOSIS — M609 Myositis, unspecified: Secondary | ICD-10-CM | POA: Diagnosis not present

## 2021-08-23 DIAGNOSIS — K219 Gastro-esophageal reflux disease without esophagitis: Secondary | ICD-10-CM | POA: Diagnosis not present

## 2021-08-23 DIAGNOSIS — Z79899 Other long term (current) drug therapy: Secondary | ICD-10-CM | POA: Diagnosis not present

## 2021-08-23 DIAGNOSIS — M3500 Sicca syndrome, unspecified: Secondary | ICD-10-CM | POA: Diagnosis not present

## 2021-08-23 DIAGNOSIS — M810 Age-related osteoporosis without current pathological fracture: Secondary | ICD-10-CM | POA: Diagnosis not present

## 2021-08-23 DIAGNOSIS — M159 Polyosteoarthritis, unspecified: Secondary | ICD-10-CM | POA: Diagnosis not present

## 2021-08-23 DIAGNOSIS — R69 Illness, unspecified: Secondary | ICD-10-CM | POA: Diagnosis not present

## 2021-08-23 DIAGNOSIS — G7241 Inclusion body myositis [IBM]: Secondary | ICD-10-CM | POA: Diagnosis not present

## 2021-08-31 ENCOUNTER — Inpatient Hospital Stay: Payer: Medicare HMO | Attending: Adult Health | Admitting: Oncology

## 2021-08-31 ENCOUNTER — Inpatient Hospital Stay: Payer: Medicare HMO

## 2021-08-31 ENCOUNTER — Other Ambulatory Visit: Payer: Self-pay

## 2021-08-31 VITALS — BP 125/67 | HR 62 | Temp 97.9°F | Resp 18

## 2021-08-31 DIAGNOSIS — Z803 Family history of malignant neoplasm of breast: Secondary | ICD-10-CM | POA: Diagnosis not present

## 2021-08-31 DIAGNOSIS — I7300 Raynaud's syndrome without gangrene: Secondary | ICD-10-CM | POA: Insufficient documentation

## 2021-08-31 DIAGNOSIS — D519 Vitamin B12 deficiency anemia, unspecified: Secondary | ICD-10-CM

## 2021-08-31 DIAGNOSIS — Z800 Family history of malignant neoplasm of digestive organs: Secondary | ICD-10-CM | POA: Diagnosis not present

## 2021-08-31 DIAGNOSIS — M3500 Sicca syndrome, unspecified: Secondary | ICD-10-CM | POA: Diagnosis not present

## 2021-08-31 DIAGNOSIS — Z862 Personal history of diseases of the blood and blood-forming organs and certain disorders involving the immune mechanism: Secondary | ICD-10-CM | POA: Diagnosis not present

## 2021-08-31 DIAGNOSIS — Z7952 Long term (current) use of systemic steroids: Secondary | ICD-10-CM | POA: Diagnosis not present

## 2021-08-31 DIAGNOSIS — M3320 Polymyositis, organ involvement unspecified: Secondary | ICD-10-CM | POA: Insufficient documentation

## 2021-08-31 DIAGNOSIS — Z801 Family history of malignant neoplasm of trachea, bronchus and lung: Secondary | ICD-10-CM | POA: Insufficient documentation

## 2021-08-31 DIAGNOSIS — D508 Other iron deficiency anemias: Secondary | ICD-10-CM | POA: Diagnosis not present

## 2021-08-31 LAB — CBC WITH DIFFERENTIAL/PLATELET
Abs Immature Granulocytes: 0.03 10*3/uL (ref 0.00–0.07)
Basophils Absolute: 0 10*3/uL (ref 0.0–0.1)
Basophils Relative: 1 %
Eosinophils Absolute: 0.1 10*3/uL (ref 0.0–0.5)
Eosinophils Relative: 2 %
HCT: 35.8 % — ABNORMAL LOW (ref 36.0–46.0)
Hemoglobin: 11.9 g/dL — ABNORMAL LOW (ref 12.0–15.0)
Immature Granulocytes: 1 %
Lymphocytes Relative: 15 %
Lymphs Abs: 0.7 10*3/uL (ref 0.7–4.0)
MCH: 37.4 pg — ABNORMAL HIGH (ref 26.0–34.0)
MCHC: 33.2 g/dL (ref 30.0–36.0)
MCV: 112.6 fL — ABNORMAL HIGH (ref 80.0–100.0)
Monocytes Absolute: 0.4 10*3/uL (ref 0.1–1.0)
Monocytes Relative: 8 %
Neutro Abs: 3.7 10*3/uL (ref 1.7–7.7)
Neutrophils Relative %: 73 %
Platelets: 230 10*3/uL (ref 150–400)
RBC: 3.18 MIL/uL — ABNORMAL LOW (ref 3.87–5.11)
RDW: 15.5 % (ref 11.5–15.5)
WBC: 5 10*3/uL (ref 4.0–10.5)
nRBC: 0.6 % — ABNORMAL HIGH (ref 0.0–0.2)

## 2021-08-31 LAB — RETIC PANEL
Immature Retic Fract: 12.4 % (ref 2.3–15.9)
RBC.: 3.15 MIL/uL — ABNORMAL LOW (ref 3.87–5.11)
Retic Count, Absolute: 60.5 10*3/uL (ref 19.0–186.0)
Retic Ct Pct: 1.9 % (ref 0.4–3.1)
Reticulocyte Hemoglobin: 41.1 pg (ref >27.9)

## 2021-08-31 LAB — COMPREHENSIVE METABOLIC PANEL
ALT: 10 U/L (ref 0–44)
AST: 22 U/L (ref 15–41)
Albumin: 4 g/dL (ref 3.5–5.0)
Alkaline Phosphatase: 40 U/L (ref 38–126)
Anion gap: 12 (ref 5–15)
BUN: 24 mg/dL — ABNORMAL HIGH (ref 8–23)
CO2: 28 mmol/L (ref 22–32)
Calcium: 9.9 mg/dL (ref 8.9–10.3)
Chloride: 101 mmol/L (ref 98–111)
Creatinine, Ser: 0.73 mg/dL (ref 0.44–1.00)
GFR, Estimated: >60 mL/min (ref >60)
Glucose, Bld: 86 mg/dL (ref 70–99)
Potassium: 4.6 mmol/L (ref 3.5–5.1)
Sodium: 141 mmol/L (ref 135–145)
Total Bilirubin: 0.7 mg/dL (ref 0.3–1.2)
Total Protein: 6.7 g/dL (ref 6.5–8.1)

## 2021-08-31 LAB — SAVE SMEAR(SSMR), FOR PROVIDER SLIDE REVIEW

## 2021-08-31 LAB — IRON AND TIBC
Iron: 133 ug/dL (ref 41–142)
Saturation Ratios: 50 % (ref 21–57)
TIBC: 266 ug/dL (ref 236–444)
UIBC: 133 ug/dL (ref 120–384)

## 2021-08-31 LAB — FERRITIN: Ferritin: 131 ng/mL (ref 11–307)

## 2021-08-31 NOTE — Progress Notes (Signed)
[name]  [phone] [phone]     ID: ANALEYA LUALLEN DOB: 30-Jul-1946  MR#: [id number]  ERX#:[id number]  Patient Care Team: Deland Pretty, MD as PCP - General (Internal Medicine) Leontine Radman, Virgie Dad, MD as Consulting Physician (Oncology) Lahoma Rocker, MD as Consulting Physician (Rheumatology) Fontaine, Belinda Block, MD (Inactive) as Consulting Physician (Gynecology) Bond, Tracie Harrier, MD as Consulting Physician (Ophthalmology) OTHER MD:  CHIEF COMPLAINT: anemia in the setting of chronic inflammation   CURRENT TREATMENT: Observation   INTERVAL HISTORY: MADDOX BRATCHER is here for f/u of her anemia secondary to her rheumatologic condition.  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  There is no specific treatment for this.  She is still taking Imuran, Plaquenil, and Prednisone.  She is following with her rheumatologist every 3-6 months.    REVIEW OF SYSTEMS: Azarria stopped driving because it was not safe for her.  She depends on her children to get her to doctor appointments and other visits.  She does most of her shopping including for food online.  She does a little bit of housework.  She does not otherwise exercise although she did enjoy water aerobics previously when she could go with her sister and she did feel that was helping.  She notices that she has lost some weight and has become a bit weaker over the past several months.   COVID 19 VACCINATION STATUS: Refuses vaccination.  She has had COVID twice, in December 2021 and July 2022.  She still has not recovered her taste or smell.   HISTORY OF CURRENT ILLNESS: From the original intake note:   Ms. Bickle has a long history of iron deficiency anemia dating back to early 2009.  She was seen by my former partner Dr. Ralene Ok and a thorough evaluation  including GI work-up failed to demonstrate a clear etiology.  She was found to have a history of Zenker's diverticulum and esophageal stricture.  There was no evidence of celiac disease.  Her anemia and her iron deficiency did respond to oral supplementation.  The patient has significant comorbidities including polymyositis, Sjogren's syndrome and Raynaud's phenomenon.  In October 2018 she was evaluated by another 1 of my partners, Dr. Alen Blew.  The patient received Feraheme 10/10/2017 and 10/17/2017.  She was then transferred to the care of our locum's, Dr. Lebron Conners, as of January 2019.  He noted after the Feraheme the anemia had resolved, and the MCV had normalized, and the ferritin had significantly improved.  He suggested monitoring to keep the ferritin between 50 and to 110 in this patient with a chronic inflammatory state.  At the 04/21/2018 visit he also noted subclinical B12 deficiency.  He recommended the start of oral B12 supplementation.  The patient's subsequent history is as detailed below   PAST MEDICAL HISTORY: Past Medical History:  Diagnosis Date   AAA (abdominal aortic aneurysm) ([place])    small      report in epic 11/11 states no aaa     Acute blood loss anemia    Cataract    surgery on both eyes   Chronic lower back pain    "for 6 years; relieved since back OR 07/2013" (09/08/2013)   Difficult intubation    "ive been told im small", no problems listed with back sx in past   Diverticulosis    Family history  of anesthesia complication    "sister has severe sleep apnea; she has alot of trouble w/anesthesia" (09/08/2013)   Family history of breast cancer    Family history of breast cancer in female    Family history of lung cancer    Family history of ovarian cancer    Gastritis    GERD (gastroesophageal reflux disease)    Glaucoma    Hiatal hernia    High cholesterol    "not severe; can't take the medicine" (09/08/2013)   History of hiatal hernia    "small one"   HTN  (hypertension)    Iron deficiency anemia    hx   Migraines    "hurt like a migraine; don't get them like I did when I was younger" (09/08/2013)   Multiple thyroid nodules    OA (osteoarthritis)    Osteoporosis    Pancreatitis    PHN (postherpetic neuralgia)    right side from shingles   Polymyositis ([place])    Raynaud phenomenon    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   Shingles    Sjogren's syndrome ([place])    Swallowing difficulty    unable to swallow whole pills   Swelling of left lower extremity     PAST SURGICAL HISTORY: Past Surgical History:  Procedure Laterality Date   ABDOMINAL EXPOSURE N/A 06/02/2016   Procedure: ABDOMINAL EXPOSURE;  Surgeon: Rosetta Posner, MD;  Location: [facility];  Service: Vascular;  Laterality: N/A;   ANTERIOR LAT LUMBAR FUSION  06/02/2016   Procedure: Thoracic twelve-Lumbar one, Lumbar one-two anterior Lateral Interbody Fusion;  Surgeon: Erline Levine, MD;  Location: [facility];  Service: Neurosurgery;;   ANTERIOR LUMBAR FUSION N/A 06/02/2016   Procedure: Lumbar five-sacral one Anterior lumbar interbody fusion with Dr. Donnetta Hutching for approach;  Surgeon: Erline Levine, MD;  Location: [facility];  Service: Neurosurgery;  Laterality: N/A;   APPLICATION OF INTRAOPERATIVE CT SCAN N/A 06/02/2016   Procedure: APPLICATION OF INTRAOPERATIVE CT SCAN;  Surgeon: Erline Levine, MD;  Location: [facility];  Service: Neurosurgery;  Laterality: N/A;   CATARACT EXTRACTION W/ INTRAOCULAR LENS  IMPLANT, BILATERAL Bilateral 12/2010-01/2011   DENTAL SURGERY  11/2011-12/2011   one on each side in my bottom jaw; dental implants" (09/08/2013)   ESOPHAGEAL DILATION     "a few times before 02/2010; not since" (09/08/2013)   ESOPHAGEAL MANOMETRY N/A 02/25/2016   Procedure: ESOPHAGEAL MANOMETRY (EM);  Surgeon: Manus Gunning, MD;  Location: [facility];  Service: Gastroenterology;  Laterality: N/A;   ESOPHAGOGASTRODUODENOSCOPY     LUMBAR FUSION  Aug 14, 2013   Dr. Consuello Masse   POSTERIOR  LUMBAR FUSION 4 LEVEL Bilateral 06/02/2016   Procedure: Exploration of Fusion Lumbar two-five, Thoracic ten-ileum posterior fusion, Posterolateral arthrodesis with AIRO;  Surgeon: Erline Levine, MD;  Location: [facility];  Service: Neurosurgery;  Laterality: Bilateral;   REPAIR ZENKER'S DIVERTICULA  02/2010   "diverticulectomy w/zenker's pouch correction" (09/08/2013)    FAMILY HISTORY Family History  Problem Relation Age of Onset   Ovarian cancer Mother 23   Hypertension Father    Heart disease Father    Breast cancer Father 96       lumpectomy and tamoxifen   Colon cancer Father 90   Psoriasis Sister    Sarcoidosis Sister    Arthritis Sister        psoriatic arthritis   Diabetes Sister    Arthritis Maternal Uncle        rheumatiod arthritis   Cancer Maternal  Uncle 65       in back- unsure if a bone cancer?   Diabetes Son    Heart disease Brother    Breast cancer Paternal Aunt 67       lumpect and radiaion   Breast cancer Paternal Aunt 45       lumpect and radiaion   Lung cancer Paternal Uncle    Heart disease Maternal Grandfather        hx smoking, died 'younger'   Heart disease Paternal Grandfather 66   Breast cancer Paternal Aunt        post menopause    GYNECOLOGIC HISTORY:  No LMP recorded. Patient is postmenopausal. Menarche: 75 years old Age at first live birth: 75 years old [place] P 4 LMP perimenopausal at 62, last period at 49 HRT no    SOCIAL HISTORY:  Nashira is now retired. 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 She lives by herself, no pets. Her husband passed away about 4 years ago with esophageal neuroendocrine cancer. She has four sons who all live in [place] and work at [employer]. Merry Proud was born in [birth year], [place] in [place], Legrand Como in [place] and [name] in 1975. She has 7 grandchildren and 1 great grandchild. She attends a [employer].    ADVANCED DIRECTIVES: [name] is listed first as her power of attorney and Legrand Como is  listed second    HEALTH MAINTENANCE: Social History   Tobacco Use   Smoking status: Never   Smokeless tobacco: Never  Vaping Use   Vaping Use: Never used  Substance Use Topics   Alcohol use: No    Alcohol/week: 0.0 standard drinks   Drug use: No     Colonoscopy: 09/26/2017  PAP: Normal, 06/22/2014  Bone density: Osteoporosis   Allergies  Allergen Reactions   Macrodantin [Nitrofurantoin Macrocrystal]     Liquid form--Can't tolerate   Phenylephrine     Other reaction(s): Other (See Comments) -- phenylephrine eye drops only  Eye irritation, severe photophobia   Lisinopril Cough    Current Outpatient Medications  Medication Sig Dispense Refill   acetaminophen (TYLENOL) 500 MG tablet Take 500 mg by mouth every 6 (six) hours as needed for moderate pain.      Ascorbic Acid (VITAMIN C PO) Take 250 mg by mouth daily.      aspirin 81 MG chewable tablet Chew 1 tablet (81 mg total) by mouth daily.     Aspirin-Salicylamide-Caffeine (BC HEADACHE PO) Take 1 packet by mouth daily as needed (pain).     azaTHIOprine (IMURAN) 50 MG tablet Take 50 mg by mouth daily.      hydroxychloroquine (PLAQUENIL) 200 MG tablet Take 200 mg by mouth daily.     losartan (COZAAR) 50 MG tablet Take 50 mg by mouth daily.      Magnesium 250 MG TABS Take 250 mg by mouth 2 (two) times daily.     meloxicam (MOBIC) 7.5 MG tablet Take 7.5 mg by mouth daily.     Multiple Vitamin (MULTIVITAMIN) tablet Take 1 tablet by mouth daily.     NONFORMULARY OR COMPOUNDED ITEM Estradiol vaginal cream 0.71% once applicator twice weekly 90 each 4   predniSONE (DELTASONE) 5 MG tablet Take 1 tablet (5 mg total) by mouth daily with breakfast. (Patient taking differently: Take 7 mg by mouth daily with breakfast. ) 30 tablet 0   Tetrahydrozoline HCl (VISINE OP) Place 1 drop into both eyes daily as needed (dry eyes). Reported on 05/16/2016  timolol (BETIMOL) 0.5 % ophthalmic solution Place 1 drop into both eyes at bedtime.      traMADol (ULTRAM) 50 MG tablet Take 1 tablet (50 mg total) by mouth 4 (four) times daily - after meals and at bedtime. (Patient taking differently: Take 50 mg by mouth every 6 (six) hours as needed for moderate pain or severe pain. ) 30 tablet 0   triamterene-hydrochlorothiazide (MAXZIDE-25) 37.5-25 MG tablet Take 1 tablet by mouth daily.     zoledronic acid (RECLAST) 5 MG/100ML SOLN injection Inject 5 mg into the vein once.     No current facility-administered medications for this visit.    OBJECTIVE: Middle-aged white woman examined in a [place]  Vitals:   08/31/21 1222  BP: 125/67  Pulse: 62  Resp: 18  Temp: 97.9 F (36.6 C)  SpO2: 100%      There is no height or weight on file to calculate BMI.   Wt Readings from Last 3 Encounters:  09/01/20 111 lb 1.6 oz (50.4 kg)  04/12/20 114 lb (51.7 kg)  02/06/19 123 lb (55.8 kg)      ECOG FS:2 - Symptomatic, <50% confined to bed  Sclerae unicteric, EOMs intact Wearing a mask No cervical or supraclavicular adenopathy Lungs no rales or rhonchi Heart regular rate and rhythm Abd soft, nontender, positive bowel sounds MSK mild kyphosis but no focal spinal tenderness, no upper extremity lymphedema Neuro: nonfocal, well oriented, appropriate affect   LAB RESULTS:  CMP     Component Value Date/Time   NA 141 08/31/2021 1148   NA 136 10/03/2017 1128   K 4.6 08/31/2021 1148   K 4.1 10/03/2017 1128   CL 101 08/31/2021 1148   CL 102 03/14/2013 1039   CO2 28 08/31/2021 1148   CO2 27 10/03/2017 1128   GLUCOSE 86 08/31/2021 1148   GLUCOSE 80 10/03/2017 1128   GLUCOSE 66 (L) 03/14/2013 1039   BUN 24 (H) 08/31/2021 1148   BUN 11.8 10/03/2017 1128   CREATININE 0.73 08/31/2021 1148   CREATININE 0.66 04/01/2018 1101   CREATININE 0.7 10/03/2017 1128   CALCIUM 9.9 08/31/2021 1148   CALCIUM 9.7 10/03/2017 1128   PROT 6.7 08/31/2021 1148   PROT 7.6 10/03/2017 1128   ALBUMIN 4.0 08/31/2021 1148   ALBUMIN 3.7 10/03/2017 1128   AST 22  08/31/2021 1148   AST 25 04/01/2018 1101   AST 25 10/03/2017 1128   ALT 10 08/31/2021 1148   ALT 16 04/01/2018 1101   ALT 10 10/03/2017 1128   ALKPHOS 40 08/31/2021 1148   ALKPHOS 77 10/03/2017 1128   BILITOT 0.7 08/31/2021 1148   BILITOT 0.4 04/01/2018 1101   BILITOT 0.46 10/03/2017 1128   GFRNONAA >60 08/31/2021 1148   GFRNONAA >60 04/01/2018 1101   GFRAA >60 09/01/2020 1123   GFRAA >60 04/01/2018 1101    Lab Results  Component Value Date   TOTALPROTELP 6.8 12/16/2009    No results found for: KPAFRELGTCHN, LAMBDASER, KAPLAMBRATIO  Lab Results  Component Value Date   WBC 5.0 08/31/2021   NEUTROABS 3.7 08/31/2021   HGB 11.9 (L) 08/31/2021   HCT 35.8 (L) 08/31/2021   MCV 112.6 (H) 08/31/2021   PLT 230 08/31/2021   No results found for: LABCA2  No components found for: [id number]  No results for input(s): INR in the last 168 hours.  No results found for: LABCA2  No results found for: WUJ811  No results found for: BJY782  No results found for: NFA213  No results  found for: CA2729  No components found for: HGQUANT  No results found for: CEA1 / No results found for: CEA1   No results found for: AFPTUMOR  No results found for: [facility]  Lab Results  Component Value Date   TOTALPROTELP 6.8 12/16/2009   (this displays SPEP labs)  No results found for: KPAFRELGTCHN, LAMBDASER, KAPLAMBRATIO (kappa/lambda light chains)  No results found for: HGBA, HGBA2QUANT, HGBFQUANT, HGBSQUAN (Hemoglobinopathy evaluation)   Lab Results  Component Value Date   LDH 236 03/14/2013    Lab Results  Component Value Date   IRON 107 06/01/2021   TIBC 261 06/01/2021   IRONPCTSAT 41 06/01/2021   (Iron and TIBC)  Lab Results  Component Value Date   FERRITIN 110 06/01/2021    Urinalysis    Component Value Date/Time   COLORURINE YELLOW 06/17/2016 1111   APPEARANCEUR CLOUDY (A) 06/17/2016 1111   LABSPEC 1.022 06/17/2016 1111   PHURINE 8.0 06/17/2016 1111    GLUCOSEU NEGATIVE 06/17/2016 1111   HGBUR NEGATIVE 06/17/2016 1111   BILIRUBINUR NEGATIVE 06/17/2016 1111   KETONESUR NEGATIVE 06/17/2016 1111   PROTEINUR NEGATIVE 06/17/2016 1111   UROBILINOGEN 0.2 10/27/2015 1038   NITRITE NEGATIVE 06/17/2016 1111   LEUKOCYTESUR LARGE (A) 06/17/2016 1111    STUDIES: No results found.   ELIGIBLE FOR AVAILABLE RESEARCH PROTOCOL: no  ASSESSMENT: 75 y.o.   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 STK11, TP53, TSC1, TSC2, and VHL.  The following genes were evaluated for sequence changes only: SDHA and HOXB13 c.251G>A variant only.   PLAN: Linda's hemoglobin is well over 10, and actually is the best that we have on record for her.  Her MCV is elevated.  We have checked her folate and B12 and they are normal.  The reason for the MCV elevation is her Imuran and so that is not of consequence.  She is extremely well-informed regarding inclusion body myositis and belongs to 2 support groups.  She is considering seeking a second opinion in [place].    She was told by her prior [facility] care physician that she should not have vaccinations because he had no data on patients like her.  I I would recommend vaccination but it is true that she is not likely to make any antibodies given her immune suppression.  I think she would be a very good  candidate for [place] and I gave her information regarding that.  If she wishes to receive it here she will let us know and we will be glad to set it up for her.   I also said it would be fine for her to "graduate" today if she wished as we are merely observing and not treating, but she feels very reassured by being seen by [place] on a once a year basis just to make sure her hemoglobin and of the lab work is fine.  We are glad to accommodate that.  Total encounter time: 25 minutes*   Jayziah Bankhead C. Charan Prieto, MD 08/31/21  12:41 PM Medical Oncology and Hematology [facility] [place], [place] [address] Tel. [phone]    Fax. [phone]   I, Wilburn Mylar, am acting as scribe for Dr. Virgie Dad. Mitesh Rosendahl.  I, Lurline Del MD, have reviewed the above documentation for accuracy and completeness, and I agree with the above.    *Total Encounter Time as defined by the Centers for Medicare and Medicaid Services includes, in addition to the face-to-face time of a patient visit (documented in the note above) non-face-to-face time: obtaining and reviewing outside history, ordering and reviewing medications, tests or procedures, care coordination (communications with other health care professionals or caregivers) and documentation in the medical record.

## 2021-09-07 ENCOUNTER — Other Ambulatory Visit: Payer: Medicare HMO

## 2021-09-07 ENCOUNTER — Ambulatory Visit: Payer: Medicare HMO | Admitting: Oncology

## 2021-09-08 DIAGNOSIS — H401112 Primary open-angle glaucoma, right eye, moderate stage: Secondary | ICD-10-CM | POA: Diagnosis not present

## 2021-09-08 DIAGNOSIS — H401122 Primary open-angle glaucoma, left eye, moderate stage: Secondary | ICD-10-CM | POA: Diagnosis not present

## 2021-09-22 DIAGNOSIS — M810 Age-related osteoporosis without current pathological fracture: Secondary | ICD-10-CM | POA: Diagnosis not present

## 2021-09-22 DIAGNOSIS — E7801 Familial hypercholesterolemia: Secondary | ICD-10-CM | POA: Diagnosis not present

## 2021-09-29 DIAGNOSIS — E039 Hypothyroidism, unspecified: Secondary | ICD-10-CM | POA: Diagnosis not present

## 2021-09-29 DIAGNOSIS — M48061 Spinal stenosis, lumbar region without neurogenic claudication: Secondary | ICD-10-CM | POA: Diagnosis not present

## 2021-09-29 DIAGNOSIS — Z23 Encounter for immunization: Secondary | ICD-10-CM | POA: Diagnosis not present

## 2021-09-29 DIAGNOSIS — M810 Age-related osteoporosis without current pathological fracture: Secondary | ICD-10-CM | POA: Diagnosis not present

## 2021-09-29 DIAGNOSIS — Z0000 Encounter for general adult medical examination without abnormal findings: Secondary | ICD-10-CM | POA: Diagnosis not present

## 2021-09-29 DIAGNOSIS — R2681 Unsteadiness on feet: Secondary | ICD-10-CM | POA: Diagnosis not present

## 2021-09-29 DIAGNOSIS — I10 Essential (primary) hypertension: Secondary | ICD-10-CM | POA: Diagnosis not present

## 2021-09-29 DIAGNOSIS — R2689 Other abnormalities of gait and mobility: Secondary | ICD-10-CM | POA: Diagnosis not present

## 2021-09-29 DIAGNOSIS — M3500 Sicca syndrome, unspecified: Secondary | ICD-10-CM | POA: Diagnosis not present

## 2021-12-21 DIAGNOSIS — H401112 Primary open-angle glaucoma, right eye, moderate stage: Secondary | ICD-10-CM | POA: Diagnosis not present

## 2021-12-21 DIAGNOSIS — M3501 Sicca syndrome with keratoconjunctivitis: Secondary | ICD-10-CM | POA: Diagnosis not present

## 2021-12-21 DIAGNOSIS — Z961 Presence of intraocular lens: Secondary | ICD-10-CM | POA: Diagnosis not present

## 2021-12-21 DIAGNOSIS — H401122 Primary open-angle glaucoma, left eye, moderate stage: Secondary | ICD-10-CM | POA: Diagnosis not present

## 2021-12-26 DIAGNOSIS — E039 Hypothyroidism, unspecified: Secondary | ICD-10-CM | POA: Diagnosis not present

## 2021-12-26 DIAGNOSIS — M159 Polyosteoarthritis, unspecified: Secondary | ICD-10-CM | POA: Diagnosis not present

## 2021-12-26 DIAGNOSIS — Z79899 Other long term (current) drug therapy: Secondary | ICD-10-CM | POA: Diagnosis not present

## 2021-12-26 DIAGNOSIS — M810 Age-related osteoporosis without current pathological fracture: Secondary | ICD-10-CM | POA: Diagnosis not present

## 2021-12-26 DIAGNOSIS — R600 Localized edema: Secondary | ICD-10-CM | POA: Diagnosis not present

## 2021-12-26 DIAGNOSIS — K219 Gastro-esophageal reflux disease without esophagitis: Secondary | ICD-10-CM | POA: Diagnosis not present

## 2021-12-26 DIAGNOSIS — R69 Illness, unspecified: Secondary | ICD-10-CM | POA: Diagnosis not present

## 2021-12-26 DIAGNOSIS — M3500 Sicca syndrome, unspecified: Secondary | ICD-10-CM | POA: Diagnosis not present

## 2021-12-26 DIAGNOSIS — G7241 Inclusion body myositis [IBM]: Secondary | ICD-10-CM | POA: Diagnosis not present

## 2021-12-26 DIAGNOSIS — M609 Myositis, unspecified: Secondary | ICD-10-CM | POA: Diagnosis not present

## 2022-02-28 DIAGNOSIS — M810 Age-related osteoporosis without current pathological fracture: Secondary | ICD-10-CM | POA: Diagnosis not present

## 2022-03-10 DIAGNOSIS — H401132 Primary open-angle glaucoma, bilateral, moderate stage: Secondary | ICD-10-CM | POA: Diagnosis not present

## 2022-03-27 DIAGNOSIS — H401122 Primary open-angle glaucoma, left eye, moderate stage: Secondary | ICD-10-CM | POA: Diagnosis not present

## 2022-03-27 DIAGNOSIS — H401112 Primary open-angle glaucoma, right eye, moderate stage: Secondary | ICD-10-CM | POA: Diagnosis not present

## 2022-04-24 DIAGNOSIS — H9201 Otalgia, right ear: Secondary | ICD-10-CM | POA: Diagnosis not present

## 2022-04-24 DIAGNOSIS — H6121 Impacted cerumen, right ear: Secondary | ICD-10-CM | POA: Diagnosis not present

## 2022-04-24 DIAGNOSIS — H938X1 Other specified disorders of right ear: Secondary | ICD-10-CM | POA: Diagnosis not present

## 2022-04-24 DIAGNOSIS — J302 Other seasonal allergic rhinitis: Secondary | ICD-10-CM | POA: Diagnosis not present

## 2022-04-24 DIAGNOSIS — H9193 Unspecified hearing loss, bilateral: Secondary | ICD-10-CM | POA: Diagnosis not present

## 2022-04-25 DIAGNOSIS — K219 Gastro-esophageal reflux disease without esophagitis: Secondary | ICD-10-CM | POA: Diagnosis not present

## 2022-04-25 DIAGNOSIS — G7241 Inclusion body myositis [IBM]: Secondary | ICD-10-CM | POA: Diagnosis not present

## 2022-04-25 DIAGNOSIS — M3500 Sicca syndrome, unspecified: Secondary | ICD-10-CM | POA: Diagnosis not present

## 2022-04-25 DIAGNOSIS — M159 Polyosteoarthritis, unspecified: Secondary | ICD-10-CM | POA: Diagnosis not present

## 2022-04-25 DIAGNOSIS — Z79899 Other long term (current) drug therapy: Secondary | ICD-10-CM | POA: Diagnosis not present

## 2022-04-25 DIAGNOSIS — M609 Myositis, unspecified: Secondary | ICD-10-CM | POA: Diagnosis not present

## 2022-04-25 DIAGNOSIS — M810 Age-related osteoporosis without current pathological fracture: Secondary | ICD-10-CM | POA: Diagnosis not present

## 2022-04-25 DIAGNOSIS — R600 Localized edema: Secondary | ICD-10-CM | POA: Diagnosis not present

## 2022-04-25 DIAGNOSIS — R69 Illness, unspecified: Secondary | ICD-10-CM | POA: Diagnosis not present

## 2022-06-26 DIAGNOSIS — H401112 Primary open-angle glaucoma, right eye, moderate stage: Secondary | ICD-10-CM | POA: Diagnosis not present

## 2022-06-26 DIAGNOSIS — H401122 Primary open-angle glaucoma, left eye, moderate stage: Secondary | ICD-10-CM | POA: Diagnosis not present

## 2022-08-02 ENCOUNTER — Ambulatory Visit: Payer: Self-pay | Admitting: Surgery

## 2022-08-02 DIAGNOSIS — M609 Myositis, unspecified: Secondary | ICD-10-CM | POA: Diagnosis not present

## 2022-08-02 DIAGNOSIS — M6281 Muscle weakness (generalized): Secondary | ICD-10-CM | POA: Diagnosis not present

## 2022-08-02 NOTE — H&P (View-Only) (Signed)
History of Present Illness: [name] is a 76 y.o. female who was referred to me for evaluation of a muscle biopsy for myositis. Per her records she was initially diagnosed in 2000 based on a muscle biopsy. She brought a copy of her prior path report today, which showed inflammatory myopathy consistent with idiopathic myositis. Her rheumatologist suspects inclusion body myositis as she has continued to have weakness, and referred her for a repeat biopsy to guide treatment. Biopsy of the left thigh is requested.   She does not have any cardiopulmonary disease and takes no blood thinners.       Review of Systems: A complete review of systems was obtained from the patient.  I have reviewed this information and discussed as appropriate with the patient.  See HPI as well for other ROS.       Medical History: Past Medical History Past Medical History: Diagnosis Date  Anemia    Glaucoma (increased eye pressure)    Myositis    Osteoarthritis    Sjogren's disease (CMS-HCC)    Thyroid disease        Patient Active Problem List Diagnosis  Muscle weakness     Past Surgical History History reviewed. No pertinent surgical history.     Allergies No Known Allergies    Current Outpatient Medications on File Prior to Visit Medication Sig Dispense Refill  aspirin 81 MG chewable tablet 325 mg      azaTHIOprine (IMURAN) 50 mg tablet Take 2 tablets by mouth once daily      multivit with min-folic acid 0.4 mg Tab 1 tablet      predniSONE (DELTASONE) 1 MG tablet TAKE 3 TABLETS ONCE DAILY  WITH FOOD OR MILK      predniSONE (DELTASONE) 5 MG tablet Take 1 tablet by mouth once daily       No current facility-administered medications on file prior to visit.     Family HistoryExpand by Default Family History Problem Relation Age of Onset  Coronary Artery Disease (Blocked arteries around heart) Father    Colon cancer Father    Breast cancer Father    Diabetes Sister    Stroke Brother     Coronary Artery Disease (Blocked arteries around heart) Brother        Social History   Tobacco Use Smoking Status Never Smokeless Tobacco Never     Social History Social History    Socioeconomic History  Marital status: Unknown Tobacco Use  Smoking status: Never  Smokeless tobacco: Never Substance and Sexual Activity  Alcohol use: Never  Drug use: Never      Objective:     Vitals:   08/02/22 1341 BP: 114/70 Pulse: 74 Temp: 36.5 C (97.7 F) SpO2: 99% Weight: 45.4 kg (100 lb) Height: 144.8 cm ('4\' 9"'$ )   Body mass index is 21.64 kg/m.   Physical Exam Vitals reviewed.  Constitutional:      General: She is not in acute distress.    Appearance: Normal appearance.  HENT:     Head: Normocephalic and atraumatic.  Eyes:     General: No scleral icterus.    Conjunctiva/sclera: Conjunctivae normal.  Cardiovascular:     Rate and Rhythm: Normal rate and regular rhythm.     Heart sounds: No murmur heard. Pulmonary:     Effort: Pulmonary effort is normal. No respiratory distress.     Breath sounds: Normal breath sounds. No wheezing.  Skin:    General: Skin is warm and dry.  Coloration: Skin is not jaundiced.  Neurological:     General: No focal deficit present.     Mental Status: She is alert and oriented to person, place, and time.  Psychiatric:        Mood and Affect: Mood normal.        Behavior: Behavior normal.        Thought Content: Thought content normal.            Assessment and Plan: Diagnoses and all orders for this visit:   Myositis, unspecified myositis type, unspecified site   Muscle weakness       This is a 76 yo female with known myositis, referred for a biopsy to evaluate for inclusion body myositis. Will schedule patient for a biopsy of the left thigh muscle. I reviewed the details of this procedure with the patient and her son and she agrees to proceed. She will be contacted to schedule a surgery date.  Michaelle Birks,  [facility], Hepatobiliary and Pancreatic Surgery 08/02/22 2:37 PM

## 2022-08-02 NOTE — H&P (Signed)
History of Present Illness: [name] is a 76 y.o. female who was referred to me for evaluation of a muscle biopsy for myositis. Per her records she was initially diagnosed in 2000 based on a muscle biopsy. She brought a copy of her prior path report today, which showed inflammatory myopathy consistent with idiopathic myositis. Her rheumatologist suspects inclusion body myositis as she has continued to have weakness, and referred her for a repeat biopsy to guide treatment. Biopsy of the left thigh is requested.   She does not have any cardiopulmonary disease and takes no blood thinners.       Review of Systems: A complete review of systems was obtained from the patient.  I have reviewed this information and discussed as appropriate with the patient.  See HPI as well for other ROS.       Medical History: Past Medical History Past Medical History: Diagnosis Date  Anemia    Glaucoma (increased eye pressure)    Myositis    Osteoarthritis    Sjogren's disease (CMS-HCC)    Thyroid disease        Patient Active Problem List Diagnosis  Muscle weakness     Past Surgical History History reviewed. No pertinent surgical history.     Allergies No Known Allergies    Current Outpatient Medications on File Prior to Visit Medication Sig Dispense Refill  aspirin 81 MG chewable tablet 325 mg      azaTHIOprine (IMURAN) 50 mg tablet Take 2 tablets by mouth once daily      multivit with min-folic acid 0.4 mg Tab 1 tablet      predniSONE (DELTASONE) 1 MG tablet TAKE 3 TABLETS ONCE DAILY  WITH FOOD OR MILK      predniSONE (DELTASONE) 5 MG tablet Take 1 tablet by mouth once daily       No current facility-administered medications on file prior to visit.     Family HistoryExpand by Default Family History Problem Relation Age of Onset  Coronary Artery Disease (Blocked arteries around heart) Father    Colon cancer Father    Breast cancer Father    Diabetes Sister    Stroke Brother     Coronary Artery Disease (Blocked arteries around heart) Brother        Social History   Tobacco Use Smoking Status Never Smokeless Tobacco Never     Social History Social History    Socioeconomic History  Marital status: Unknown Tobacco Use  Smoking status: Never  Smokeless tobacco: Never Substance and Sexual Activity  Alcohol use: Never  Drug use: Never      Objective:     Vitals:   08/02/22 1341 BP: 114/70 Pulse: 74 Temp: 36.5 C (97.7 F) SpO2: 99% Weight: 45.4 kg (100 lb) Height: 144.8 cm ('4\' 9"'$ )   Body mass index is 21.64 kg/m.   Physical Exam Vitals reviewed.  Constitutional:      General: She is not in acute distress.    Appearance: Normal appearance.  HENT:     Head: Normocephalic and atraumatic.  Eyes:     General: No scleral icterus.    Conjunctiva/sclera: Conjunctivae normal.  Cardiovascular:     Rate and Rhythm: Normal rate and regular rhythm.     Heart sounds: No murmur heard. Pulmonary:     Effort: Pulmonary effort is normal. No respiratory distress.     Breath sounds: Normal breath sounds. No wheezing.  Skin:    General: Skin is warm and dry.  Coloration: Skin is not jaundiced.  Neurological:     General: No focal deficit present.     Mental Status: She is alert and oriented to person, place, and time.  Psychiatric:        Mood and Affect: Mood normal.        Behavior: Behavior normal.        Thought Content: Thought content normal.            Assessment and Plan: Diagnoses and all orders for this visit:   Myositis, unspecified myositis type, unspecified site   Muscle weakness       This is a 76 yo female with known myositis, referred for a biopsy to evaluate for inclusion body myositis. Will schedule patient for a biopsy of the left thigh muscle. I reviewed the details of this procedure with the patient and her son and she agrees to proceed. She will be contacted to schedule a surgery date.  Michaelle Birks,  [facility], Hepatobiliary and Pancreatic Surgery 08/02/22 2:37 PM

## 2022-08-09 ENCOUNTER — Other Ambulatory Visit: Payer: Self-pay

## 2022-08-09 ENCOUNTER — Encounter (HOSPITAL_COMMUNITY): Payer: Self-pay | Admitting: Surgery

## 2022-08-09 NOTE — Progress Notes (Signed)
Anesthesia Chart Review:  Case: [id number] Date/Time: 08/10/22 0715   Procedure: LEFT THIGH MUSCLE BIOPSY (Left)   Anesthesia type: Monitor Anesthesia Care   Pre-op diagnosis: MYOSITIS   Location: [facility] ROOM 12 / [facility]   Surgeons: [name], MD       DISCUSSION: Patient is a 76 year old female scheduled for the above procedure.  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 DIFFICULT INTUBATION is listed, but no difficult noted by 08/10/13 and 6/1/617 anesthesia records.   By notes, initially diagnosed with "inflammatory myopathy consistent with idiopathic myositis" in 2000 based on muscle biopsy. Her rheumatologist referred her for repeat biopsy due to suspected "inclusion body myositis as she has continued to have weakness".  - 08/10/13 intubation records: mask ventilation without difficulty, grade I view, 7.50m ETT inserted under direct vision x1 attempt, Mac and 3.  - 06/02/16 intubation records:  mask ventilation without difficulty, grade I view, 7.587mETT inserted under direct vision x1 attempt, Mac and 3.   She is a same day work-up, so anesthesia team to evaluate on the day of surgery.  Labs and EKG as indicated on arrival. She is on daily prednisone currently, as well as hydroxychloroquine and azathioprine.   VS: Ht _0  (1.473 m)   Wt 45.4 kg   BMI 20.90 kg/m  BP Readings from Last 3 Encounters:  08/31/21 125/67  11/23/20 139/70  09/01/20 133/71   Pulse Readings from Last 3 Encounters:  08/31/21 62   11/23/20 69  09/01/20 64     PROVIDERS: PhDeland PrettyMD is PCP ArLahoma RockerMD is rheumatologist   LABS: For day of surgery as indicated. As of 08/31/21, Cr 0.73, glucose 86, H/H 11.9/35.8, PLT 230.    IMAGES: USKoreahyroid 09/06/18: No discrete nodules are seen within the thyroid gland. 0.4 cm benign colloid cyst, inferior right. IMPRESSION: 1. Normal-sized thyroid. No nodule or other radiologic indication for biopsy or dedicated imaging follow-up.    EKG: Last EKG seen is fro m6/16/17: NSR, non-specific ST/T wave abnormality   CV: Echo 06/05/16 (post-ALIF): Study Conclusions  - Left ventricle: The cavity size was normal. Systolic function was    vigorous. The estimated ejection fraction was in the range of 65%    to 70%. Wall motion was normal; there were no regional wall    motion abnormalities. Doppler parameters are consistent with    abnormal left ventricular relaxation (grade 1 diastolic    dysfunction). Doppler parameters are consistent with    indeterminate ventricular filling pressure.  - Aortic valve: Transvalvular velocity was within the normal range.    There was no stenosis. There was no regurgitation.  - Mitral valve: Transvalvular velocity was within the normal range.    There was no evidence for stenosis. There was no regurgitation.  - Right ventricle: The cavity size was normal. Wall thickness was    normal. Systolic function was normal.  - Right atrium: The atrium was mildly dilated.  - Atrial septum: No defect or patent foramen ovale was identified    by color flow Doppler.  - Tricuspid valve: There was trivial regurgitation.  - Pulmonary arteries: PA peak  pressure: 32 mm Hg (S).  - Pericardium, extracardiac: There was a left pleural effusion.   [place] Abd Aorta 11/02/10: MPRESSION: No abdominal aortic aneurysm is seen.  The maximum diameter of the  abdominal aorta is 2.6 x 2.6 cm.    Past Medical History:  Diagnosis Date   AAA (abdominal aortic  aneurysm) ([place])    small      report in epic 11/11 states no aaa     Acute blood loss anemia    Cataract    surgery on both eyes   Chronic lower back pain    "for 6 years; relieved since back OR 07/2013" (09/08/2013)   Difficult intubation    "ive been told im small", no problems listed with back sx in past   Diverticulosis    Family history of anesthesia complication    "sister has severe sleep apnea; she has alot of trouble w/anesthesia" (09/08/2013)   Family history of breast cancer    Family history of breast cancer in female    Family history of lung cancer    Family history of ovarian cancer    Gastritis    GERD (gastroesophageal reflux disease)    "very little reflux currently" 08/09/22   Glaucoma    Hiatal hernia    High cholesterol    "not severe; can't take the medicine" (09/08/2013)   History of hiatal hernia    "small one"   HTN (hypertension)    Hypothyroidism    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[facility])    Iron deficiency anemia    hx   Migraines    don't get them like I did when I was younger" (09/08/2013)   Multiple thyroid nodules    OA (osteoarthritis)    Osteoporosis    Pancreatitis    PHN (postherpetic neuralgia)    right side from shingles   Polymyositis ([place])    Raynaud phenomenon    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   Shingles    Sjogren's syndrome ([place])    Swallowing difficulty    unable to swallow whole pills   Swelling of ankle    bilateral   Swelling of left lower extremity     Past Surgical History:  Procedure Laterality Date   ABDOMINAL EXPOSURE N/A 06/02/2016   Procedure: ABDOMINAL EXPOSURE;  Surgeon: Rosetta Posner, MD;  Location: [facility];  Service: Vascular;  Laterality: N/A;   ANTERIOR LAT LUMBAR FUSION  06/02/2016   Procedure: Thoracic twelve-Lumbar one, Lumbar one-two anterior Lateral Interbody Fusion;  Surgeon: Erline Levine, MD;  Location: [facility];  Service: Neurosurgery;;   ANTERIOR LUMBAR FUSION N/A 06/02/2016   Procedure: Lumbar five-sacral  one Anterior lumbar interbody fusion with Dr. Donnetta Hutching for approach;  Surgeon: Erline Levine, MD;  Location: [facility];  Service: Neurosurgery;  Laterality: N/A;   APPLICATION OF INTRAOPERATIVE CT SCAN N/A 06/02/2016   Procedure: APPLICATION OF INTRAOPERATIVE CT SCAN;  Surgeon: Erline Levine, MD;  Location: [facility];  Service: Neurosurgery;  Laterality: N/A;   CATARACT EXTRACTION W/ INTRAOCULAR LENS  IMPLANT, BILATERAL Bilateral 12/2010-01/2011   DENTAL SURGERY  11/2011-12/2011   one on each side in my bottom jaw; dental implants" (09/08/2013)   ESOPHAGEAL DILATION     "a few times before 02/2010; not since" (09/08/2013)   ESOPHAGEAL MANOMETRY N/A 02/25/2016   Procedure: ESOPHAGEAL MANOMETRY (EM);  Surgeon: Manus Gunning, MD;  Location: [facility];  Service: Gastroenterology;  Laterality: N/A;   ESOPHAGOGASTRODUODENOSCOPY     LUMBAR FUSION  Aug 14, 2013   Dr. Consuello Masse   POSTERIOR LUMBAR FUSION 4 LEVEL Bilateral 06/02/2016   Procedure: Exploration of Fusion Lumbar two-five, Thoracic ten-ileum posterior fusion, Posterolateral arthrodesis with AIRO;  Surgeon: Erline Levine, MD;  Location: [facility];  Service: Neurosurgery;  Laterality: Bilateral;   REPAIR ZENKER'S DIVERTICULA  02/2010   "diverticulectomy w/zenker's pouch correction" (09/08/2013)    MEDICATIONS: No current facility-administered medications for this encounter.    acetaminophen (TYLENOL) 500 MG tablet   azaTHIOprine (IMURAN) 50 MG tablet   furosemide (LASIX) 20 MG tablet   hydroxychloroquine (PLAQUENIL) 200 MG tablet   levothyroxine (SYNTHROID) 25 MCG tablet   meloxicam (MOBIC) 7.5 MG tablet   Polyethyl Glycol-Propyl Glycol (SYSTANE ULTRA) 0.4-0.3 % SOLN   predniSONE (DELTASONE) 1 MG tablet   predniSONE (DELTASONE) 5 MG tablet   timolol (TIMOPTIC) 0.5 % ophthalmic solution   traMADol (ULTRAM) 50 MG tablet   zoledronic acid (RECLAST) 5 MG/100ML SOLN injection    Myra Gianotti, PA-C Surgical Short  Stay/Anesthesiology [facility] Phone [phone] [facility] Phone [phone] 08/09/2022 11:32 AM

## 2022-08-09 NOTE — Progress Notes (Signed)
PCP - Dr Deland Pretty Cardiologist - n/a  Chest x-ray - n/a EKG - DOS Stress Test - "many years ago and it was normal" ECHO - 06/05/16 Cardiac Cath - n/a  ICD Pacemaker/Loop - n/a  Sleep Study -  n/a CPAP - none  Stop azathioprine until after surgery.  ERAS: Clear liquids til 4:30 AM DOS  Anesthesia review: Yes  STOP now taking any Aspirin (unless otherwise instructed by your surgeon), Aleve, Naproxen, Ibuprofen, Motrin, Advil, Goody's, BC's, all herbal medications, fish oil, and all vitamins.   Coronavirus Screening Do you have any of the following symptoms:  Cough yes/no: No Fever (>100.107F)  yes/no: No Runny nose yes/no: No Sore throat yes/no: No Difficulty breathing/shortness of breath  yes/no: No  Have you traveled in the last 14 days and where? yes/no: No  Patient verbalized understanding of instructions that were given via phone.

## 2022-08-09 NOTE — Anesthesia Preprocedure Evaluation (Signed)
Anesthesia Evaluation  Patient identified by MRN, date of birth, ID band Patient awake    Reviewed: Allergy & Precautions, H&P , NPO status , Patient's Chart, lab work & pertinent test results  History of Anesthesia Complications (+) Family history of anesthesia reaction  Airway Mallampati: II   Neck ROM: full    Dental   Pulmonary    breath sounds clear to auscultation       Cardiovascular hypertension,  Rhythm:regular Rate:Normal     Neuro/Psych  Headaches,  Neuromuscular disease    GI/Hepatic hiatal hernia, GERD  ,  Endo/Other  Hypothyroidism   Renal/GU      Musculoskeletal  (+) Arthritis ,   Abdominal   Peds  Hematology   Anesthesia Other Findings   Reproductive/Obstetrics                            Anesthesia Physical Anesthesia Plan  ASA: 2  Anesthesia Plan: MAC   Post-op Pain Management:    Induction: Intravenous  PONV Risk Score and Plan: 2 and Propofol infusion, Ondansetron and Treatment may vary due to age or medical condition  Airway Management Planned: Simple Face Mask  Additional Equipment:   Intra-op Plan:   Post-operative Plan:   Informed Consent: I have reviewed the patients History and Physical, chart, labs and discussed the procedure including the risks, benefits and alternatives for the proposed anesthesia with the patient or authorized representative who has indicated his/her understanding and acceptance.     Dental advisory given  Plan Discussed with: CRNA, Anesthesiologist and Surgeon  Anesthesia Plan Comments: (PAT note written 08/09/2022 by Shonna Chock, PA-C. )       Anesthesia Quick Evaluation

## 2022-08-10 ENCOUNTER — Ambulatory Visit (HOSPITAL_COMMUNITY): Payer: Medicare HMO | Admitting: Vascular Surgery

## 2022-08-10 ENCOUNTER — Encounter (HOSPITAL_COMMUNITY)
Admission: RE | Disposition: A | Discharge status: Home / Self Care | Payer: Self-pay | Source: Home / Self Care | Attending: Surgery

## 2022-08-10 ENCOUNTER — Encounter (HOSPITAL_COMMUNITY): Payer: Self-pay | Admitting: Surgery

## 2022-08-10 ENCOUNTER — Ambulatory Visit (HOSPITAL_BASED_OUTPATIENT_CLINIC_OR_DEPARTMENT_OTHER): Payer: Medicare HMO | Admitting: Vascular Surgery

## 2022-08-10 ENCOUNTER — Ambulatory Visit (HOSPITAL_COMMUNITY)
Admission: RE | Admit: 2022-08-10 | Discharge: 2022-08-10 | Disposition: A | Discharge status: Home / Self Care | Payer: Medicare HMO | Attending: Surgery | Admitting: Surgery

## 2022-08-10 ENCOUNTER — Other Ambulatory Visit: Payer: Self-pay

## 2022-08-10 DIAGNOSIS — E039 Hypothyroidism, unspecified: Secondary | ICD-10-CM

## 2022-08-10 DIAGNOSIS — I10 Essential (primary) hypertension: Secondary | ICD-10-CM

## 2022-08-10 DIAGNOSIS — M609 Myositis, unspecified: Secondary | ICD-10-CM

## 2022-08-10 DIAGNOSIS — G709 Myoneural disorder, unspecified: Secondary | ICD-10-CM | POA: Diagnosis not present

## 2022-08-10 DIAGNOSIS — M1990 Unspecified osteoarthritis, unspecified site: Secondary | ICD-10-CM | POA: Diagnosis not present

## 2022-08-10 DIAGNOSIS — M6281 Muscle weakness (generalized): Secondary | ICD-10-CM | POA: Diagnosis not present

## 2022-08-10 DIAGNOSIS — K449 Diaphragmatic hernia without obstruction or gangrene: Secondary | ICD-10-CM | POA: Insufficient documentation

## 2022-08-10 HISTORY — PX: MUSCLE BIOPSY: SHX716

## 2022-08-10 HISTORY — DX: 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IBM): G72.41

## 2022-08-10 HISTORY — DX: Effusion, unspecified ankle: M25.473

## 2022-08-10 HISTORY — DX: Hypothyroidism, unspecified: E03.9

## 2022-08-10 LAB — CBC
HCT: 34 % — ABNORMAL LOW (ref 36.0–46.0)
Hemoglobin: 11.7 g/dL — ABNORMAL LOW (ref 12.0–15.0)
MCH: 38.4 pg — ABNORMAL HIGH (ref 26.0–34.0)
MCHC: 34.4 g/dL (ref 30.0–36.0)
MCV: 111.5 fL — ABNORMAL HIGH (ref 80.0–100.0)
Platelets: 205 10*3/uL (ref 150–400)
RBC: 3.05 MIL/uL — ABNORMAL LOW (ref 3.87–5.11)
RDW: 15 % (ref 11.5–15.5)
WBC: 5.3 10*3/uL (ref 4.0–10.5)
nRBC: 0.4 % — ABNORMAL HIGH (ref 0.0–0.2)

## 2022-08-10 LAB — BASIC METABOLIC PANEL
Anion gap: 8 (ref 5–15)
BUN: 23 mg/dL (ref 8–23)
CO2: 27 mmol/L (ref 22–32)
Calcium: 9.1 mg/dL (ref 8.9–10.3)
Chloride: 104 mmol/L (ref 98–111)
Creatinine, Ser: 0.65 mg/dL (ref 0.44–1.00)
GFR, Estimated: >60 mL/min (ref >60)
Glucose, Bld: 82 mg/dL (ref 70–99)
Potassium: 3.4 mmol/L — ABNORMAL LOW (ref 3.5–5.1)
Sodium: 139 mmol/L (ref 135–145)

## 2022-08-10 SURGERY — MUSCLE BIOPSY
Anesthesia: Monitor Anesthesia Care | Site: Thigh | Laterality: Left

## 2022-08-10 MED ORDER — CHLORHEXIDINE GLUCONATE 0.12 % MT SOLN
15.0000 mL | Freq: Once | OROMUCOSAL | Status: AC
Start: 2022-08-10 — End: 2022-08-10

## 2022-08-10 MED ORDER — CHLORHEXIDINE GLUCONATE 0.12 % MT SOLN
OROMUCOSAL | Status: AC
  Administered 2022-08-10: 15 mL via OROMUCOSAL
  Filled 2022-08-10: qty 15

## 2022-08-10 MED ORDER — OXYCODONE HCL 5 MG/5ML PO SOLN: 5.0000 mg | Freq: Once | ORAL | Status: DC | PRN

## 2022-08-10 MED ORDER — CEFAZOLIN SODIUM-DEXTROSE 2-4 GM/100ML-% IV SOLN
2.0000 g | INTRAVENOUS | Status: AC
  Administered 2022-08-10: 2 g via INTRAVENOUS
  Filled 2022-08-10: qty 100

## 2022-08-10 MED ORDER — ORAL CARE MOUTH RINSE: 15.0000 mL | Freq: Once | OROMUCOSAL | Status: AC

## 2022-08-10 MED ORDER — BUPIVACAINE-EPINEPHRINE (PF) 0.25% -1:200000 IJ SOLN
INTRAMUSCULAR | Status: DC | PRN
  Administered 2022-08-10: 5 mL

## 2022-08-10 MED ORDER — 0.9 % SODIUM CHLORIDE (POUR BTL) OPTIME
TOPICAL | Status: DC | PRN
  Administered 2022-08-10: 1000 mL

## 2022-08-10 MED ORDER — OXYCODONE HCL 5 MG PO TABS: 5.0000 mg | ORAL_TABLET | Freq: Once | ORAL | Status: DC | PRN

## 2022-08-10 MED ORDER — BUPIVACAINE-EPINEPHRINE (PF) 0.25% -1:200000 IJ SOLN
INTRAMUSCULAR | Status: AC
  Filled 2022-08-10: qty 30

## 2022-08-10 MED ORDER — LIDOCAINE HCL (PF) 1 % IJ SOLN
INTRAMUSCULAR | Status: DC | PRN
  Administered 2022-08-10: 5 mL

## 2022-08-10 MED ORDER — LACTATED RINGERS IV SOLN: INTRAVENOUS | Status: DC

## 2022-08-10 MED ORDER — PROPOFOL 10 MG/ML IV BOLUS
INTRAVENOUS | Status: AC
  Filled 2022-08-10: qty 20

## 2022-08-10 MED ORDER — PROPOFOL 500 MG/50ML IV EMUL
INTRAVENOUS | Status: DC | PRN
  Administered 2022-08-10: 50 ug/kg/min via INTRAVENOUS

## 2022-08-10 MED ORDER — PROPOFOL 10 MG/ML IV BOLUS
INTRAVENOUS | Status: DC | PRN
  Administered 2022-08-10 (×2): 20 mg via INTRAVENOUS

## 2022-08-10 MED ORDER — LIDOCAINE HCL (PF) 1 % IJ SOLN
INTRAMUSCULAR | Status: AC
  Filled 2022-08-10: qty 30

## 2022-08-10 MED ORDER — FENTANYL CITRATE (PF) 250 MCG/5ML IJ SOLN
INTRAMUSCULAR | Status: AC
  Filled 2022-08-10: qty 5

## 2022-08-10 MED ORDER — ONDANSETRON HCL 4 MG/2ML IJ SOLN
INTRAMUSCULAR | Status: DC | PRN
  Administered 2022-08-10: 4 mg via INTRAVENOUS

## 2022-08-10 MED ORDER — PHENYLEPHRINE HCL-NACL 20-0.9 MG/250ML-% IV SOLN
INTRAVENOUS | Status: AC
Start: 2022-08-10 — End: ?
  Filled 2022-08-10: qty 500

## 2022-08-10 MED ORDER — FENTANYL CITRATE (PF) 100 MCG/2ML IJ SOLN: 25.0000 ug | INTRAMUSCULAR | Status: DC | PRN

## 2022-08-10 MED ORDER — ONDANSETRON HCL 4 MG/2ML IJ SOLN: 4.0000 mg | Freq: Four times a day (QID) | INTRAMUSCULAR | Status: DC | PRN

## 2022-08-10 SURGICAL SUPPLY — 42 items
BAG COUNTER SPONGE SURGICOUNT (BAG) ×1 IMPLANT
CANISTER SUCT 3000ML PPV (MISCELLANEOUS) ×1 IMPLANT
CHLORAPREP W/TINT 26 (MISCELLANEOUS) ×1 IMPLANT
CNTNR URN SCR LID CUP LEK RST (MISCELLANEOUS) IMPLANT
CONT SPEC 4OZ STRL OR WHT (MISCELLANEOUS) ×1
COVER SURGICAL LIGHT HANDLE (MISCELLANEOUS) ×1 IMPLANT
DERMABOND IMPLANT
DERMABOND ADVANCED (GAUZE/BANDAGES/DRESSINGS) ×1
DERMABOND ADVANCED .7 DNX12 (GAUZE/BANDAGES/DRESSINGS) ×1 IMPLANT
DRAPE LAPAROSCOPIC ABDOMINAL (DRAPES) IMPLANT
DRAPE LAPAROTOMY 100X72 PEDS (DRAPES) IMPLANT
DRSG TEGADERM 4X4.75 (GAUZE/BANDAGES/DRESSINGS) IMPLANT
DRSG TELFA 3X8 NADH (GAUZE/BANDAGES/DRESSINGS) ×1 IMPLANT
ELECT REM PT RETURN 9FT ADLT (ELECTROSURGICAL) ×1
ELECTRODE REM PT RTRN 9FT ADLT (ELECTROSURGICAL) ×1 IMPLANT
GAUZE 4X4 16PLY ~~LOC~~+RFID DBL (SPONGE) IMPLANT
GAUZE SPONGE 4X4 12PLY STRL (GAUZE/BANDAGES/DRESSINGS) IMPLANT
GLOVE BIOGEL PI IND STRL 6 (GLOVE) ×1 IMPLANT
GLOVE BIOGEL PI INDICATOR 6 (GLOVE) ×1
GLOVE BIOGEL PI MICRO 5.5 (GLOVE) ×1
GLOVE BIOGEL PI MICRO STRL 5.5 (GLOVE) ×1 IMPLANT
GOWN STRL REUS W/ TWL LRG LVL3 (GOWN DISPOSABLE) ×1 IMPLANT
GOWN STRL REUS W/TWL LRG LVL3 (GOWN DISPOSABLE) ×1
KIT BASIN OR (CUSTOM PROCEDURE TRAY) ×1 IMPLANT
KIT TURNOVER KIT B (KITS) ×1 IMPLANT
MARKER SKIN DUAL TIP RULER LAB (MISCELLANEOUS) ×1 IMPLANT
NDL HYPO 25GX1X1/2 BEV (NEEDLE) ×1 IMPLANT
NEEDLE HYPO 25GX1X1/2 BEV (NEEDLE) ×1 IMPLANT
NS IRRIG 1000ML POUR BTL (IV SOLUTION) ×1 IMPLANT
PACK GENERAL/GYN (CUSTOM PROCEDURE TRAY) ×1 IMPLANT
PAD ARMBOARD 7.5X6 YLW CONV (MISCELLANEOUS) ×2 IMPLANT
PAD DRESSING TELFA 3X8 NADH (GAUZE/BANDAGES/DRESSINGS) IMPLANT
PENCIL SMOKE EVACUATOR (MISCELLANEOUS) ×1 IMPLANT
SPECIMEN JAR SMALL (MISCELLANEOUS) ×1 IMPLANT
SPONGE T-LAP 4X18 ~~LOC~~+RFID (SPONGE) IMPLANT
STRIP CLOSURE SKIN 1/2X4 (GAUZE/BANDAGES/DRESSINGS) IMPLANT
SUT MON AB 4-0 PC3 18 (SUTURE) ×1 IMPLANT
SUT VIC AB 3-0 SH 27 (SUTURE) ×2
SUT VIC AB 3-0 SH 27X BRD (SUTURE) ×1 IMPLANT
SYR CONTROL 10ML LL (SYRINGE) ×1 IMPLANT
TOWEL GREEN STERILE (TOWEL DISPOSABLE) ×1 IMPLANT
TOWEL GREEN STERILE FF (TOWEL DISPOSABLE) ×1 IMPLANT

## 2022-08-10 NOTE — Interval H&P Note (Signed)
History and Physical Interval Note:  08/10/2022 7:18 AM  [name]  has presented today for surgery, with the diagnosis of MYOSITIS.  The various methods of treatment have been discussed with the patient and family. After consideration of risks, benefits and other options for treatment, the patient has consented to  Procedure(s): LEFT THIGH MUSCLE BIOPSY (Left) as a surgical intervention.  The patient's history has been reviewed, patient examined, no change in status, stable for surgery.  I have reviewed the patient's chart and labs.  Questions were answered to the patient's satisfaction.     [name]

## 2022-08-10 NOTE — Anesthesia Postprocedure Evaluation (Signed)
Anesthesia Post Note  Patient: [name]  Procedure(s) Performed: LEFT THIGH MUSCLE BIOPSY (Left: Thigh)     Patient location during evaluation: PACU Anesthesia Type: MAC Level of consciousness: awake and alert Pain management: pain level controlled Vital Signs Assessment: post-procedure vital signs reviewed and stable Respiratory status: spontaneous breathing, nonlabored ventilation, respiratory function stable and patient connected to nasal cannula oxygen Cardiovascular status: stable and blood pressure returned to baseline Postop Assessment: no apparent nausea or vomiting Anesthetic complications: no   No notable events documented.  Last Vitals:  Vitals:   08/10/22 0830 08/10/22 0842  BP: 132/72 136/73  Pulse: 61 62  Resp: 16 13  Temp:  36.7 C  SpO2: 97% 98%    Last Pain:  Vitals:   08/10/22 0842  TempSrc:   PainSc: 0-No pain                 Jahnasia Tatum S

## 2022-08-10 NOTE — Op Note (Signed)
Date: 08/10/22  Patient: [name] MRN: [id number]  Preoperative Diagnosis: Myositis Postoperative Diagnosis: Same  Procedure: Left thigh muscle biopsy  Surgeon: Michaelle Birks, MD  EBL: Minimal  Anesthesia: Monitored anesthesia care  Specimens: Left thigh muscle  Indications: [name] is a 76 yo female who has a history of myositis. She has been on treatment but has continued to have persistent asymmetric muscle weakness, and was referred for her rheumatologist for a muscle biopsy to evaluate for inclusion body myositis.  Findings: Incisional biopsy of the left vastus lateralis muscle was performed. Fatty infiltration of the muscle was noted.  Procedure details: Informed consent was obtained in the preoperative area prior to the procedure. The patient was brought to the operating room and placed on the table in the supine position. Sedation was administered, and perioperative antibiotics were administered per SCIP guidelines. The left thigh was prepped and draped in the usual sterile fashion. A pre-procedure timeout was taken verifying patient identity, surgical site and procedure to be performed.  The skin was infiltrated with local anesthetic and a small longitudinal incision was made on the left lateral thigh. The subcutaneous tissue was divided with cautery to expose the fascia. The muscle fascia was incised and opened longitudinally. The underlying muscle appeared very fatty. Multiple samples of muscle were excised using cautery. The specimen was sent fresh to pathology. The wound appeared hemostatic. The fascia was closed with a running 3-0 Vicryl suture. The subcutaneous layer was closed with a running 3-0 Vicryl suture, and the skin was closed with a running 4-0 monocryl suture. Dermabond was applied.  The patient tolerated the procedure well with no apparent complications. All counts were correct x2 at the end of the procedure. The patient was taken to PACU in stable  condition.  Michaelle Birks, MD 08/10/22 8:21 AM

## 2022-08-10 NOTE — Discharge Instructions (Addendum)
[facility] DISCHARGE INSTRUCTIONS  Activity Ok to shower in 24 hours, but do not bathe or submerge incisions underwater for 2 weeks.  Wound Care Your incision is covered with skin glue called Dermabond. This will peel off on its own over time. You may shower and allow warm soapy water to run over your incisions in 24 hours. Gently pat dry. Do not submerge your incision underwater for 2 weeks. Monitor your incision for any new redness, tenderness, or drainage.  When to Call us: Fever greater than 100.5 New redness, drainage, or swelling at incision site Severe pain, nausea, or vomiting  Follow-up You have an appointment scheduled with Dr. Zenia Resides on Sept 11, 2023 at 9:30am. This will be at the [facility] Surgery office at [address] [place], [place], [place]. Please arrive at least 15 minutes prior to your scheduled appointment time. If your incision is healing without any issues and you would prefer not to have this appointment, please call to cancel.  For questions or concerns, please call the office at [phone].

## 2022-08-10 NOTE — Transfer of Care (Signed)
Immediate Anesthesia Transfer of Care Note  Patient: [name]  Procedure(s) Performed: LEFT THIGH MUSCLE BIOPSY (Left: Thigh)  Patient Location: PACU  Anesthesia Type:MAC  Level of Consciousness: drowsy and patient cooperative  Airway & Oxygen Therapy: Patient Spontanous Breathing  Post-op Assessment: Report given to RN, Post -op Vital signs reviewed and stable and Patient moving all extremities X 4  Post vital signs: Reviewed and stable  Last Vitals:  Vitals Value Taken Time  BP 134/75 08/10/22 0819  Temp    Pulse 63 08/10/22 0821  Resp 13 08/10/22 0821  SpO2 97 % 08/10/22 0821  Vitals shown include unvalidated device data.  Last Pain:  Vitals:   08/10/22 1886  TempSrc:   PainSc: 0-No pain         Complications: No notable events documented.

## 2022-08-10 NOTE — Anesthesia Procedure Notes (Signed)
Procedure Name: MAC Date/Time: 08/10/2022 7:39 AM  Performed by: Darletta Moll, CRNAPre-anesthesia Checklist: Patient identified, Emergency Drugs available, Suction available and Patient being monitored Patient Re-evaluated:Patient Re-evaluated prior to induction Oxygen Delivery Method: Nasal cannula

## 2022-08-11 ENCOUNTER — Encounter (HOSPITAL_COMMUNITY): Payer: Self-pay | Admitting: Surgery

## 2022-08-16 DIAGNOSIS — H401122 Primary open-angle glaucoma, left eye, moderate stage: Secondary | ICD-10-CM | POA: Diagnosis not present

## 2022-08-16 DIAGNOSIS — Z961 Presence of intraocular lens: Secondary | ICD-10-CM | POA: Diagnosis not present

## 2022-08-16 DIAGNOSIS — M3501 Sicca syndrome with keratoconjunctivitis: Secondary | ICD-10-CM | POA: Diagnosis not present

## 2022-08-16 DIAGNOSIS — H401112 Primary open-angle glaucoma, right eye, moderate stage: Secondary | ICD-10-CM | POA: Diagnosis not present

## 2022-08-31 ENCOUNTER — Other Ambulatory Visit: Payer: Self-pay

## 2022-08-31 DIAGNOSIS — D508 Other iron deficiency anemias: Secondary | ICD-10-CM

## 2022-09-01 ENCOUNTER — Inpatient Hospital Stay: Payer: Medicare HMO | Attending: Hematology and Oncology

## 2022-09-01 ENCOUNTER — Inpatient Hospital Stay (HOSPITAL_BASED_OUTPATIENT_CLINIC_OR_DEPARTMENT_OTHER): Payer: Medicare HMO | Admitting: Hematology and Oncology

## 2022-09-01 VITALS — BP 147/87 | HR 73 | Temp 98.0°F | Resp 15 | Wt 99.4 lb

## 2022-09-01 DIAGNOSIS — D519 Vitamin B12 deficiency anemia, unspecified: Secondary | ICD-10-CM

## 2022-09-01 DIAGNOSIS — Z803 Family history of malignant neoplasm of breast: Secondary | ICD-10-CM | POA: Insufficient documentation

## 2022-09-01 DIAGNOSIS — D508 Other iron deficiency anemias: Secondary | ICD-10-CM

## 2022-09-01 DIAGNOSIS — Z862 Personal history of diseases of the blood and blood-forming organs and certain disorders involving the immune mechanism: Secondary | ICD-10-CM | POA: Insufficient documentation

## 2022-09-01 DIAGNOSIS — M3320 Polymyositis, organ involvement unspecified: Secondary | ICD-10-CM | POA: Insufficient documentation

## 2022-09-01 DIAGNOSIS — Z801 Family history of malignant neoplasm of trachea, bronchus and lung: Secondary | ICD-10-CM | POA: Insufficient documentation

## 2022-09-01 DIAGNOSIS — I7300 Raynaud's syndrome without gangrene: Secondary | ICD-10-CM | POA: Diagnosis not present

## 2022-09-01 DIAGNOSIS — Z800 Family history of malignant neoplasm of digestive organs: Secondary | ICD-10-CM | POA: Insufficient documentation

## 2022-09-01 DIAGNOSIS — Z7952 Long term (current) use of systemic steroids: Secondary | ICD-10-CM | POA: Insufficient documentation

## 2022-09-01 DIAGNOSIS — M3500 Sicca syndrome, unspecified: Secondary | ICD-10-CM | POA: Diagnosis not present

## 2022-09-01 LAB — CMP (CANCER CENTER ONLY)
ALT: 13 U/L (ref 0–44)
AST: 31 U/L (ref 15–41)
Albumin: 4 g/dL (ref 3.5–5.0)
Alkaline Phosphatase: 36 U/L — ABNORMAL LOW (ref 38–126)
Anion gap: 8 (ref 5–15)
BUN: 23 mg/dL (ref 8–23)
CO2: 29 mmol/L (ref 22–32)
Calcium: 9.6 mg/dL (ref 8.9–10.3)
Chloride: 102 mmol/L (ref 98–111)
Creatinine: 0.76 mg/dL (ref 0.44–1.00)
GFR, Estimated: >60 mL/min (ref >60)
Glucose, Bld: 89 mg/dL (ref 70–99)
Potassium: 3.8 mmol/L (ref 3.5–5.1)
Sodium: 139 mmol/L (ref 135–145)
Total Bilirubin: 0.8 mg/dL (ref 0.3–1.2)
Total Protein: 6.5 g/dL (ref 6.5–8.1)

## 2022-09-01 LAB — CBC WITH DIFFERENTIAL (CANCER CENTER ONLY)
Abs Immature Granulocytes: 0.04 10*3/uL (ref 0.00–0.07)
Basophils Absolute: 0 10*3/uL (ref 0.0–0.1)
Basophils Relative: 0 %
Eosinophils Absolute: 0.1 10*3/uL (ref 0.0–0.5)
Eosinophils Relative: 2 %
HCT: 36.5 % (ref 36.0–46.0)
Hemoglobin: 12.5 g/dL (ref 12.0–15.0)
Immature Granulocytes: 1 %
Lymphocytes Relative: 25 %
Lymphs Abs: 1.7 10*3/uL (ref 0.7–4.0)
MCH: 38.1 pg — ABNORMAL HIGH (ref 26.0–34.0)
MCHC: 34.2 g/dL (ref 30.0–36.0)
MCV: 111.3 fL — ABNORMAL HIGH (ref 80.0–100.0)
Monocytes Absolute: 0.5 10*3/uL (ref 0.1–1.0)
Monocytes Relative: 7 %
Neutro Abs: 4.3 10*3/uL (ref 1.7–7.7)
Neutrophils Relative %: 65 %
Platelet Count: 226 10*3/uL (ref 150–400)
RBC: 3.28 MIL/uL — ABNORMAL LOW (ref 3.87–5.11)
RDW: 15.1 % (ref 11.5–15.5)
WBC Count: 6.7 10*3/uL (ref 4.0–10.5)
nRBC: 0.6 % — ABNORMAL HIGH (ref 0.0–0.2)

## 2022-09-01 LAB — RETIC PANEL
Immature Retic Fract: 10.7 % (ref 2.3–15.9)
RBC.: 3.27 MIL/uL — ABNORMAL LOW (ref 3.87–5.11)
Retic Count, Absolute: 61.1 10*3/uL (ref 19.0–186.0)
Retic Ct Pct: 1.9 % (ref 0.4–3.1)
Reticulocyte Hemoglobin: 40.4 pg (ref >27.9)

## 2022-09-01 LAB — FERRITIN: Ferritin: 63 ng/mL (ref 11–307)

## 2022-09-01 LAB — IRON AND IRON BINDING CAPACITY (CC-WL,HP ONLY)
Iron: 123 ug/dL (ref 28–170)
Saturation Ratios: 41 % — ABNORMAL HIGH (ref 10.4–31.8)
TIBC: 301 ug/dL (ref 250–450)
UIBC: 178 ug/dL

## 2022-09-01 NOTE — Progress Notes (Signed)
[place] Telephone:([phone]   Fax:([phone]) [phone]  PROGRESS NOTE  Patient Care Team: Deland Pretty, MD as PCP - General (Internal Medicine) Lahoma Rocker, MD as Consulting Physician (Rheumatology) Bond, Tracie Harrier, MD as Consulting Physician (Ophthalmology) Orson Slick, MD as Consulting Physician (Hematology and Oncology)  Hematological/Oncological History # Anemia of Chronic Disease 08/31/2021: last visit with Dr. Jana Hakim  09/01/2022: establish care with Dr. Lorenso Courier   Interval History:  [name] 76 y.o. female with medical history significant for anemia of chronic disease who presents for a follow up visit. The patient's last visit was on 09/01/2019 with Dr. Jana Hakim. In the interim since the last visit she has had no major changes in her health.  On exam today [name] is accompanied by her son.  She reports that she has had low iron reports in the past and had IV iron last in 2018.  She reports that she is currently on Imuran and that she suffers from Sjogren's syndrome with dry eyes and dry mouth.  She reports that it has been improved on Imuran therapy.  Patient reports that her energy levels are "not too good".  They go up and down.  She reports that she is eating well and her appetite is good.  She notes that after COVID infection December 2021 she has having difficulties with her taste and smell.  She reports that she does continue to eat "out of habit" in order to maintain her weight.  She is not having any trouble with fevers, chills, sweats, nausea, vomiting or diarrhea.  She denies any bleeding, bruising, or dark stools.  A full 10 point ROS is listed below.  MEDICAL HISTORY:  Past Medical History:  Diagnosis Date   AAA (abdominal aortic aneurysm) ([place])    small      report in epic 11/11 states no aaa     Acute blood loss anemia    Cataract    surgery on both eyes   Chronic lower back pain    "for 6 years; relieved since back OR  07/2013" (09/08/2013)   Difficult intubation    "ive been told im small"; 08/10/13 & 6/1/617: mask vent w/o difficulty, grade 1 view, 7.5 ETT placed with Mac and 3   Diverticulosis    Family history of anesthesia complication    "sister has severe sleep apnea; she has alot of trouble w/anesthesia" (09/08/2013)   Family history of breast cancer    Family history of breast cancer in female    Family history of lung cancer    Family history of ovarian cancer    Gastritis    GERD (gastroesophageal reflux disease)    "very little reflux currently" 08/09/22   Glaucoma    Hiatal hernia    High cholesterol    "not severe; can't take the medicine" (09/08/2013)   History of hiatal hernia    "small one"   HTN (hypertension)    Hypothyroidism    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[facility])    Iron deficiency anemia    hx   Migraines    don't get them like I did when I was younger" (09/08/2013)   Multiple thyroid nodules    OA (osteoarthritis)    Osteoporosis    Pancreatitis    PHN (postherpetic neuralgia)    right side from shingles   Polymyositis ([place])    Raynaud phenomenon    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   Shingles    Sjogren's syndrome ([place])  Swallowing difficulty    unable to swallow whole pills   Swelling of ankle    bilateral   Swelling of left lower extremity     SURGICAL HISTORY: Past Surgical History:  Procedure Laterality Date   ABDOMINAL EXPOSURE N/A 06/02/2016   Procedure: ABDOMINAL EXPOSURE;  Surgeon: Rosetta Posner, MD;  Location: [facility];  Service: Vascular;  Laterality: N/A;   ANTERIOR LAT LUMBAR FUSION  06/02/2016   Procedure: Thoracic twelve-Lumbar one, Lumbar one-two anterior Lateral Interbody Fusion;  Surgeon: Erline Levine, MD;  Location: [place];  Service: Neurosurgery;;   ANTERIOR LUMBAR FUSION N/A 06/02/2016   Procedure: Lumbar five-sacral one Anterior lumbar interbody fusion with Dr. Donnetta Hutching for approach;  Surgeon: Erline Levine, MD;  Location: [place] [facility];  Service:  Neurosurgery;  Laterality: N/A;   APPLICATION OF INTRAOPERATIVE CT SCAN N/A 06/02/2016   Procedure: APPLICATION OF INTRAOPERATIVE CT SCAN;  Surgeon: Erline Levine, MD;  Location: [facility];  Service: Neurosurgery;  Laterality: N/A;   CATARACT EXTRACTION W/ INTRAOCULAR LENS  IMPLANT, BILATERAL Bilateral 12/2010-01/2011   DENTAL SURGERY  11/2011-12/2011   one on each side in my bottom jaw; dental implants" (09/08/2013)   ESOPHAGEAL DILATION     "a few times before 02/2010; not since" (09/08/2013)   ESOPHAGEAL MANOMETRY N/A 02/25/2016   Procedure: ESOPHAGEAL MANOMETRY (EM);  Surgeon: Manus Gunning, MD;  Location: [facility];  Service: Gastroenterology;  Laterality: N/A;   ESOPHAGOGASTRODUODENOSCOPY     LUMBAR FUSION  Aug 14, 2013   Dr. Consuello Masse   MUSCLE BIOPSY Left 08/10/2022   Procedure: LEFT THIGH MUSCLE BIOPSY;  Surgeon: [name], MD;  Location: [facility];  Service: General;  Laterality: Left;   POSTERIOR LUMBAR FUSION 4 LEVEL Bilateral 06/02/2016   Procedure: Exploration of Fusion Lumbar two-five, Thoracic ten-ileum posterior fusion, Posterolateral arthrodesis with AIRO;  Surgeon: Erline Levine, MD;  Location: [place];  Service: Neurosurgery;  Laterality: Bilateral;   REPAIR ZENKER'S DIVERTICULA  02/2010   "diverticulectomy w/zenker's pouch correction" (09/08/2013)    SOCIAL HISTORY: Social History   Socioeconomic History   Marital status: Widowed    Spouse name: Not on file   Number of children: 4   Years of education: GED   Highest education level: Not on file  Occupational History   Not on file  Tobacco Use   Smoking status: Never   Smokeless tobacco: Never  Vaping Use   Vaping Use: Never used  Substance and Sexual Activity   Alcohol use: No    Alcohol/week: 0.0 standard drinks of alcohol   Drug use: No   Sexual activity: Never    Birth control/protection: Post-menopausal    Comment: 1st intercourse 33 yo-1 partner  Other Topics Concern   Not on file  Social  History Narrative   Lives alone in a one story home.  Has 4 sons.  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  Education: GED.   Social Determinants of Health   Financial Resource Strain: Not on file  Food Insecurity: Not on file  Transportation Needs: Not on file  Physical Activity: Not on file  Stress: Not on file  Social Connections: Not on file  Intimate Partner Violence: Not on file    FAMILY HISTORY: Family History  Problem Relation Age of Onset   Ovarian cancer Mother 29   Hypertension Father    Heart disease Father    Breast cancer Father 40       lumpectomy and tamoxifen  Colon cancer Father 110   Psoriasis Sister    Sarcoidosis Sister    Arthritis Sister        psoriatic arthritis   Diabetes Sister    Arthritis Maternal Uncle        rheumatiod arthritis   Cancer Maternal Uncle 66       in back- unsure if a bone cancer?   Diabetes Son    Heart disease Brother    Breast cancer Paternal Aunt 100       lumpect and radiaion   Breast cancer Paternal Aunt 68       lumpect and radiaion   Lung cancer Paternal Uncle    Heart disease Maternal Grandfather        hx smoking, died 'younger'   Heart disease Paternal Grandfather 73   Breast cancer Paternal Aunt        post menopause    ALLERGIES:  is allergic to diovan [valsartan], macrodantin [nitrofurantoin macrocrystal], phenylephrine, and zestril [lisinopril].  MEDICATIONS:  Current Outpatient Medications  Medication Sig Dispense Refill   acetaminophen (TYLENOL) 500 MG tablet Take 500 mg by mouth daily as needed for moderate pain.     azaTHIOprine (IMURAN) 50 MG tablet Take 100 mg by mouth in the morning.     furosemide (LASIX) 20 MG tablet Take 10 mg by mouth in the morning.     hydroxychloroquine (PLAQUENIL) 200 MG tablet Take 200 mg by mouth in the morning.     levothyroxine (SYNTHROID) 25 MCG tablet Take 25 mcg by mouth daily before breakfast.     meloxicam (MOBIC) 7.5 MG tablet Take 7.5 mg by mouth daily as  needed (pain/inflammation.).     Polyethyl Glycol-Propyl Glycol (SYSTANE ULTRA) 0.4-0.3 % SOLN Place 1-2 drops into both eyes 3 (three) times daily as needed (dry/irritated eyes.).     predniSONE (DELTASONE) 1 MG tablet Take 3 mg by mouth daily.     predniSONE (DELTASONE) 5 MG tablet Take 1 tablet (5 mg total) by mouth daily with breakfast. 30 tablet 0   timolol (TIMOPTIC) 0.5 % ophthalmic solution Place 1 drop into both eyes in the morning.     traMADol (ULTRAM) 50 MG tablet Take 1 tablet (50 mg total) by mouth 4 (four) times daily - after meals and at bedtime. (Patient taking differently: Take 25-50 mg by mouth every 6 (six) hours as needed for moderate pain or severe pain.) 30 tablet 0   zoledronic acid (RECLAST) 5 MG/100ML SOLN injection Inject 5 mg into the vein once.     No current facility-administered medications for this visit.    REVIEW OF SYSTEMS:   Constitutional: ( - ) fevers, ( - )  chills , ( - ) night sweats Eyes: ( - ) blurriness of vision, ( - ) double vision, ( - ) watery eyes Ears, nose, mouth, throat, and face: ( - ) mucositis, ( - ) sore throat Respiratory: ( - ) cough, ( - ) dyspnea, ( - ) wheezes Cardiovascular: ( - ) palpitation, ( - ) chest discomfort, ( - ) lower extremity swelling Gastrointestinal:  ( - ) nausea, ( - ) heartburn, ( - ) change in bowel habits Skin: ( - ) abnormal skin rashes Lymphatics: ( - ) new lymphadenopathy, ( - ) easy bruising Neurological: ( - ) numbness, ( - ) tingling, ( - ) new weaknesses Behavioral/Psych: ( - ) mood change, ( - ) new changes  All other systems were reviewed with the patient and  are negative.  PHYSICAL EXAMINATION:  Vitals:   09/01/22 1132  BP: (!) 147/87  Pulse: 73  Resp: 15  Temp: 98 F (36.7 C)  SpO2: 99%   Filed Weights   09/01/22 1132  Weight: 99 lb 6.4 oz (45.1 kg)    GENERAL: Well-appearing elderly Caucasian female, alert, no distress and comfortable SKIN: skin color, texture, turgor are normal, no  rashes or significant lesions EYES: conjunctiva are pink and non-injected, sclera clear LUNGS: clear to auscultation and percussion with normal breathing effort HEART: regular rate & rhythm and no murmurs and no lower extremity edema Musculoskeletal: no cyanosis of digits and no clubbing  PSYCH: alert & oriented x 3, fluent speech NEURO: no focal motor/sensory deficits  LABORATORY DATA:  I have reviewed the data as listed    Latest Ref Rng & Units 09/01/2022   11:09 AM 08/10/2022    5:52 AM 08/31/2021   11:48 AM  CBC  WBC 4.0 - 10.5 K/uL 6.7  5.3  5.0   Hemoglobin 12.0 - 15.0 g/dL 12.5  11.7  11.9   Hematocrit 36.0 - 46.0 % 36.5  34.0  35.8   Platelets 150 - 400 K/uL 226  205  230        Latest Ref Rng & Units 09/01/2022   11:09 AM 08/10/2022    5:52 AM 08/31/2021   11:48 AM  CMP  Glucose 70 - 99 mg/dL 89  82  86   BUN 8 - 23 mg/dL _0 Creatinine 0.44 - 1.00 mg/dL 0.76  0.65  0.73   Sodium 135 - 145 mmol/L 139  139  141   Potassium 3.5 - 5.1 mmol/L 3.8  3.4  4.6   Chloride 98 - 111 mmol/L 102  104  101   CO2 22 - 32 mmol/L _1 Calcium 8.9 - 10.3 mg/dL 9.6  9.1  9.9   Total Protein 6.5 - 8.1 g/dL 6.5   6.7   Total Bilirubin 0.3 - 1.2 mg/dL 0.8   0.7   Alkaline Phos 38 - 126 U/L 36   40   AST 15 - 41 U/L 31   22   ALT 0 - 44 U/L 13   10     No results found for: "MPROTEIN" No results found for: "KPAFRELGTCHN", "LAMBDASER", "KAPLAMBRATIO"   RADIOGRAPHIC STUDIES: No results found.  ASSESSMENT & PLAN [name] 76 y.o. female with medical history significant for anemia of chronic disease who presents for a follow up visit.   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 STK11, TP53, TSC1, TSC2, and VHL.  The following genes were evaluated for sequence changes only: SDHA and HOXB13 c.251G>A variant only.   #Anemia of Chronic Disease -- Anemia has been resolved and patient has not required any IV iron therapy since 2018 -- Labs today  show white blood cell count 6.7, hemoglobin 12.5, MCV 111.3, and platelets of 226 -- Discussed with patient options moving forward including continuing to follow in our clinic versus continue evaluation with her primary care provider and rheumatologist.  She noted that she would continue to follow-up with those providers and if she would develop new or worsening hematological abnormalities that she would let us know and we can see her back in clinic. -- Return to clinic on an as-needed basis   No orders of the defined types were placed in this encounter.   All questions were answered. The patient knows to call the clinic with any problems, questions or concerns.  A total of more than 40 minutes were spent on this encounter with face-to-face time and non-face-to-face time, including preparing to see the patient, ordering tests and/or medications, counseling the patient and coordination of care as outlined above.   Ledell Peoples, MD Department of Hematology/Oncology [place] [facility] Phone: [phone] Pager: [phone] Email: [email or web address]  09/13/2022 12:51 PM

## 2022-09-08 ENCOUNTER — Encounter (HOSPITAL_COMMUNITY): Payer: Self-pay

## 2022-09-12 LAB — SURGICAL PATHOLOGY

## 2022-09-13 ENCOUNTER — Encounter: Payer: Self-pay | Admitting: Oncology

## 2022-09-27 DIAGNOSIS — R69 Illness, unspecified: Secondary | ICD-10-CM | POA: Diagnosis not present

## 2022-09-27 DIAGNOSIS — G7241 Inclusion body myositis [IBM]: Secondary | ICD-10-CM | POA: Diagnosis not present

## 2022-09-27 DIAGNOSIS — M159 Polyosteoarthritis, unspecified: Secondary | ICD-10-CM | POA: Diagnosis not present

## 2022-09-27 DIAGNOSIS — Z23 Encounter for immunization: Secondary | ICD-10-CM | POA: Diagnosis not present

## 2022-09-27 DIAGNOSIS — E559 Vitamin D deficiency, unspecified: Secondary | ICD-10-CM | POA: Diagnosis not present

## 2022-09-27 DIAGNOSIS — M609 Myositis, unspecified: Secondary | ICD-10-CM | POA: Diagnosis not present

## 2022-09-27 DIAGNOSIS — R319 Hematuria, unspecified: Secondary | ICD-10-CM | POA: Diagnosis not present

## 2022-09-27 DIAGNOSIS — M810 Age-related osteoporosis without current pathological fracture: Secondary | ICD-10-CM | POA: Diagnosis not present

## 2022-09-27 DIAGNOSIS — E7800 Pure hypercholesterolemia, unspecified: Secondary | ICD-10-CM | POA: Diagnosis not present

## 2022-09-27 DIAGNOSIS — Z79899 Other long term (current) drug therapy: Secondary | ICD-10-CM | POA: Diagnosis not present

## 2022-09-27 DIAGNOSIS — Z0000 Encounter for general adult medical examination without abnormal findings: Secondary | ICD-10-CM | POA: Diagnosis not present

## 2022-09-27 DIAGNOSIS — E041 Nontoxic single thyroid nodule: Secondary | ICD-10-CM | POA: Diagnosis not present

## 2022-09-27 DIAGNOSIS — K219 Gastro-esophageal reflux disease without esophagitis: Secondary | ICD-10-CM | POA: Diagnosis not present

## 2022-09-27 DIAGNOSIS — R600 Localized edema: Secondary | ICD-10-CM | POA: Diagnosis not present

## 2022-09-27 DIAGNOSIS — M3500 Sicca syndrome, unspecified: Secondary | ICD-10-CM | POA: Diagnosis not present

## 2022-09-27 DIAGNOSIS — Z0001 Encounter for general adult medical examination with abnormal findings: Secondary | ICD-10-CM | POA: Diagnosis not present

## 2022-10-03 DIAGNOSIS — E559 Vitamin D deficiency, unspecified: Secondary | ICD-10-CM | POA: Diagnosis not present

## 2022-10-03 DIAGNOSIS — N3020 Other chronic cystitis without hematuria: Secondary | ICD-10-CM | POA: Diagnosis not present

## 2022-10-03 DIAGNOSIS — G5621 Lesion of ulnar nerve, right upper limb: Secondary | ICD-10-CM | POA: Diagnosis not present

## 2022-10-03 DIAGNOSIS — M810 Age-related osteoporosis without current pathological fracture: Secondary | ICD-10-CM | POA: Diagnosis not present

## 2022-10-03 DIAGNOSIS — E041 Nontoxic single thyroid nodule: Secondary | ICD-10-CM | POA: Diagnosis not present

## 2022-10-03 DIAGNOSIS — Z0000 Encounter for general adult medical examination without abnormal findings: Secondary | ICD-10-CM | POA: Diagnosis not present

## 2022-10-18 DIAGNOSIS — G5621 Lesion of ulnar nerve, right upper limb: Secondary | ICD-10-CM | POA: Diagnosis not present

## 2022-10-26 DIAGNOSIS — G5621 Lesion of ulnar nerve, right upper limb: Secondary | ICD-10-CM | POA: Diagnosis not present

## 2022-11-02 DIAGNOSIS — G5621 Lesion of ulnar nerve, right upper limb: Secondary | ICD-10-CM | POA: Diagnosis not present

## 2022-11-21 DIAGNOSIS — G5621 Lesion of ulnar nerve, right upper limb: Secondary | ICD-10-CM | POA: Diagnosis not present

## 2022-11-23 DIAGNOSIS — M79641 Pain in right hand: Secondary | ICD-10-CM | POA: Diagnosis not present

## 2022-11-23 DIAGNOSIS — M25521 Pain in right elbow: Secondary | ICD-10-CM | POA: Diagnosis not present

## 2022-11-29 DIAGNOSIS — M25521 Pain in right elbow: Secondary | ICD-10-CM | POA: Diagnosis not present

## 2022-11-29 DIAGNOSIS — M79641 Pain in right hand: Secondary | ICD-10-CM | POA: Diagnosis not present

## 2022-12-04 DIAGNOSIS — M79641 Pain in right hand: Secondary | ICD-10-CM | POA: Diagnosis not present

## 2022-12-04 DIAGNOSIS — M25521 Pain in right elbow: Secondary | ICD-10-CM | POA: Diagnosis not present

## 2022-12-25 DIAGNOSIS — G5621 Lesion of ulnar nerve, right upper limb: Secondary | ICD-10-CM | POA: Diagnosis not present

## 2023-01-02 ENCOUNTER — Encounter: Payer: Self-pay | Admitting: Gastroenterology

## 2023-01-05 DIAGNOSIS — M25521 Pain in right elbow: Secondary | ICD-10-CM | POA: Diagnosis not present

## 2023-01-05 DIAGNOSIS — M79641 Pain in right hand: Secondary | ICD-10-CM | POA: Diagnosis not present

## 2023-01-30 DIAGNOSIS — M810 Age-related osteoporosis without current pathological fracture: Secondary | ICD-10-CM | POA: Diagnosis not present

## 2023-01-30 DIAGNOSIS — M159 Polyosteoarthritis, unspecified: Secondary | ICD-10-CM | POA: Diagnosis not present

## 2023-01-30 DIAGNOSIS — R600 Localized edema: Secondary | ICD-10-CM | POA: Diagnosis not present

## 2023-01-30 DIAGNOSIS — K219 Gastro-esophageal reflux disease without esophagitis: Secondary | ICD-10-CM | POA: Diagnosis not present

## 2023-01-30 DIAGNOSIS — M3500 Sicca syndrome, unspecified: Secondary | ICD-10-CM | POA: Diagnosis not present

## 2023-01-30 DIAGNOSIS — Z79899 Other long term (current) drug therapy: Secondary | ICD-10-CM | POA: Diagnosis not present

## 2023-01-30 DIAGNOSIS — R69 Illness, unspecified: Secondary | ICD-10-CM | POA: Diagnosis not present

## 2023-01-30 DIAGNOSIS — M609 Myositis, unspecified: Secondary | ICD-10-CM | POA: Diagnosis not present

## 2023-01-30 DIAGNOSIS — G7241 Inclusion body myositis [IBM]: Secondary | ICD-10-CM | POA: Diagnosis not present

## 2023-02-01 DIAGNOSIS — G5621 Lesion of ulnar nerve, right upper limb: Secondary | ICD-10-CM | POA: Diagnosis not present

## 2023-03-06 DIAGNOSIS — M810 Age-related osteoporosis without current pathological fracture: Secondary | ICD-10-CM | POA: Diagnosis not present

## 2023-03-14 DIAGNOSIS — H401132 Primary open-angle glaucoma, bilateral, moderate stage: Secondary | ICD-10-CM | POA: Diagnosis not present

## 2023-05-21 DIAGNOSIS — H401132 Primary open-angle glaucoma, bilateral, moderate stage: Secondary | ICD-10-CM | POA: Diagnosis not present

## 2023-05-31 DIAGNOSIS — G7241 Inclusion body myositis [IBM]: Secondary | ICD-10-CM | POA: Diagnosis not present

## 2023-05-31 DIAGNOSIS — F1920 Other psychoactive substance dependence, uncomplicated: Secondary | ICD-10-CM | POA: Diagnosis not present

## 2023-05-31 DIAGNOSIS — M159 Polyosteoarthritis, unspecified: Secondary | ICD-10-CM | POA: Diagnosis not present

## 2023-05-31 DIAGNOSIS — M810 Age-related osteoporosis without current pathological fracture: Secondary | ICD-10-CM | POA: Diagnosis not present

## 2023-05-31 DIAGNOSIS — M3500 Sicca syndrome, unspecified: Secondary | ICD-10-CM | POA: Diagnosis not present

## 2023-05-31 DIAGNOSIS — Z79899 Other long term (current) drug therapy: Secondary | ICD-10-CM | POA: Diagnosis not present

## 2023-05-31 DIAGNOSIS — R600 Localized edema: Secondary | ICD-10-CM | POA: Diagnosis not present

## 2023-05-31 DIAGNOSIS — M609 Myositis, unspecified: Secondary | ICD-10-CM | POA: Diagnosis not present

## 2023-05-31 DIAGNOSIS — K219 Gastro-esophageal reflux disease without esophagitis: Secondary | ICD-10-CM | POA: Diagnosis not present

## 2023-07-25 DIAGNOSIS — H0259 Other disorders affecting eyelid function: Secondary | ICD-10-CM | POA: Diagnosis not present

## 2023-07-25 DIAGNOSIS — Z961 Presence of intraocular lens: Secondary | ICD-10-CM | POA: Diagnosis not present

## 2023-07-25 DIAGNOSIS — Z79899 Other long term (current) drug therapy: Secondary | ICD-10-CM | POA: Diagnosis not present

## 2023-07-25 DIAGNOSIS — H401132 Primary open-angle glaucoma, bilateral, moderate stage: Secondary | ICD-10-CM | POA: Diagnosis not present

## 2023-07-25 DIAGNOSIS — M3501 Sicca syndrome with keratoconjunctivitis: Secondary | ICD-10-CM | POA: Diagnosis not present

## 2023-08-27 DIAGNOSIS — H401132 Primary open-angle glaucoma, bilateral, moderate stage: Secondary | ICD-10-CM | POA: Diagnosis not present

## 2023-10-09 DIAGNOSIS — M159 Polyosteoarthritis, unspecified: Secondary | ICD-10-CM | POA: Diagnosis not present

## 2023-10-09 DIAGNOSIS — M609 Myositis, unspecified: Secondary | ICD-10-CM | POA: Diagnosis not present

## 2023-10-09 DIAGNOSIS — M810 Age-related osteoporosis without current pathological fracture: Secondary | ICD-10-CM | POA: Diagnosis not present

## 2023-10-09 DIAGNOSIS — G7241 Inclusion body myositis [IBM]: Secondary | ICD-10-CM | POA: Diagnosis not present

## 2023-10-09 DIAGNOSIS — R600 Localized edema: Secondary | ICD-10-CM | POA: Diagnosis not present

## 2023-10-09 DIAGNOSIS — F1920 Other psychoactive substance dependence, uncomplicated: Secondary | ICD-10-CM | POA: Diagnosis not present

## 2023-10-09 DIAGNOSIS — Z79899 Other long term (current) drug therapy: Secondary | ICD-10-CM | POA: Diagnosis not present

## 2023-10-09 DIAGNOSIS — M3500 Sicca syndrome, unspecified: Secondary | ICD-10-CM | POA: Diagnosis not present

## 2023-10-16 DIAGNOSIS — Z0000 Encounter for general adult medical examination without abnormal findings: Secondary | ICD-10-CM | POA: Diagnosis not present

## 2023-10-16 DIAGNOSIS — E559 Vitamin D deficiency, unspecified: Secondary | ICD-10-CM | POA: Diagnosis not present

## 2023-10-16 DIAGNOSIS — M810 Age-related osteoporosis without current pathological fracture: Secondary | ICD-10-CM | POA: Diagnosis not present

## 2023-10-16 DIAGNOSIS — Z23 Encounter for immunization: Secondary | ICD-10-CM | POA: Diagnosis not present

## 2023-10-16 DIAGNOSIS — E039 Hypothyroidism, unspecified: Secondary | ICD-10-CM | POA: Diagnosis not present

## 2024-01-28 DIAGNOSIS — H401132 Primary open-angle glaucoma, bilateral, moderate stage: Secondary | ICD-10-CM | POA: Diagnosis not present

## 2024-02-12 DIAGNOSIS — M3500 Sicca syndrome, unspecified: Secondary | ICD-10-CM | POA: Diagnosis not present

## 2024-02-12 DIAGNOSIS — M609 Myositis, unspecified: Secondary | ICD-10-CM | POA: Diagnosis not present

## 2024-02-12 DIAGNOSIS — R600 Localized edema: Secondary | ICD-10-CM | POA: Diagnosis not present

## 2024-02-12 DIAGNOSIS — G7241 Inclusion body myositis [IBM]: Secondary | ICD-10-CM | POA: Diagnosis not present

## 2024-02-12 DIAGNOSIS — F1920 Other psychoactive substance dependence, uncomplicated: Secondary | ICD-10-CM | POA: Diagnosis not present

## 2024-02-12 DIAGNOSIS — M159 Polyosteoarthritis, unspecified: Secondary | ICD-10-CM | POA: Diagnosis not present

## 2024-02-12 DIAGNOSIS — M810 Age-related osteoporosis without current pathological fracture: Secondary | ICD-10-CM | POA: Diagnosis not present

## 2024-02-12 DIAGNOSIS — Z79899 Other long term (current) drug therapy: Secondary | ICD-10-CM | POA: Diagnosis not present

## 2024-03-06 DIAGNOSIS — M810 Age-related osteoporosis without current pathological fracture: Secondary | ICD-10-CM | POA: Diagnosis not present

## 2024-06-10 DIAGNOSIS — M609 Myositis, unspecified: Secondary | ICD-10-CM | POA: Diagnosis not present

## 2024-06-10 DIAGNOSIS — M159 Polyosteoarthritis, unspecified: Secondary | ICD-10-CM | POA: Diagnosis not present

## 2024-06-10 DIAGNOSIS — M810 Age-related osteoporosis without current pathological fracture: Secondary | ICD-10-CM | POA: Diagnosis not present

## 2024-06-10 DIAGNOSIS — Z79899 Other long term (current) drug therapy: Secondary | ICD-10-CM | POA: Diagnosis not present

## 2024-06-10 DIAGNOSIS — M3500 Sicca syndrome, unspecified: Secondary | ICD-10-CM | POA: Diagnosis not present

## 2024-06-10 DIAGNOSIS — G7241 Inclusion body myositis [IBM]: Secondary | ICD-10-CM | POA: Diagnosis not present

## 2024-06-10 DIAGNOSIS — F1920 Other psychoactive substance dependence, uncomplicated: Secondary | ICD-10-CM | POA: Diagnosis not present

## 2024-06-10 DIAGNOSIS — R600 Localized edema: Secondary | ICD-10-CM | POA: Diagnosis not present

## 2024-07-21 DIAGNOSIS — H401122 Primary open-angle glaucoma, left eye, moderate stage: Secondary | ICD-10-CM | POA: Diagnosis not present

## 2024-07-21 DIAGNOSIS — H401112 Primary open-angle glaucoma, right eye, moderate stage: Secondary | ICD-10-CM | POA: Diagnosis not present

## 2024-09-17 DIAGNOSIS — M3501 Sicca syndrome with keratoconjunctivitis: Secondary | ICD-10-CM | POA: Diagnosis not present

## 2024-09-17 DIAGNOSIS — Z961 Presence of intraocular lens: Secondary | ICD-10-CM | POA: Diagnosis not present

## 2024-09-17 DIAGNOSIS — H401112 Primary open-angle glaucoma, right eye, moderate stage: Secondary | ICD-10-CM | POA: Diagnosis not present

## 2024-09-17 DIAGNOSIS — H401122 Primary open-angle glaucoma, left eye, moderate stage: Secondary | ICD-10-CM | POA: Diagnosis not present

## 2024-10-14 DIAGNOSIS — R600 Localized edema: Secondary | ICD-10-CM | POA: Diagnosis not present

## 2024-10-14 DIAGNOSIS — Z79899 Other long term (current) drug therapy: Secondary | ICD-10-CM | POA: Diagnosis not present

## 2024-10-14 DIAGNOSIS — M609 Myositis, unspecified: Secondary | ICD-10-CM | POA: Diagnosis not present

## 2024-10-14 DIAGNOSIS — E559 Vitamin D deficiency, unspecified: Secondary | ICD-10-CM | POA: Diagnosis not present

## 2024-10-14 DIAGNOSIS — E039 Hypothyroidism, unspecified: Secondary | ICD-10-CM | POA: Diagnosis not present

## 2024-10-14 DIAGNOSIS — M810 Age-related osteoporosis without current pathological fracture: Secondary | ICD-10-CM | POA: Diagnosis not present

## 2024-10-14 DIAGNOSIS — G7241 Inclusion body myositis [IBM]: Secondary | ICD-10-CM | POA: Diagnosis not present

## 2024-10-14 DIAGNOSIS — M159 Polyosteoarthritis, unspecified: Secondary | ICD-10-CM | POA: Diagnosis not present

## 2024-10-14 DIAGNOSIS — F1920 Other psychoactive substance dependence, uncomplicated: Secondary | ICD-10-CM | POA: Diagnosis not present

## 2024-10-14 DIAGNOSIS — M3500 Sicca syndrome, unspecified: Secondary | ICD-10-CM | POA: Diagnosis not present

## 2024-10-14 DIAGNOSIS — Z23 Encounter for immunization: Secondary | ICD-10-CM | POA: Diagnosis not present

## 2024-10-14 DIAGNOSIS — D649 Anemia, unspecified: Secondary | ICD-10-CM | POA: Diagnosis not present

## 2024-10-21 DIAGNOSIS — M810 Age-related osteoporosis without current pathological fracture: Secondary | ICD-10-CM | POA: Diagnosis not present

## 2024-10-21 DIAGNOSIS — D509 Iron deficiency anemia, unspecified: Secondary | ICD-10-CM | POA: Diagnosis not present

## 2024-10-21 DIAGNOSIS — Z0000 Encounter for general adult medical examination without abnormal findings: Secondary | ICD-10-CM | POA: Diagnosis not present

## 2024-10-21 DIAGNOSIS — E7800 Pure hypercholesterolemia, unspecified: Secondary | ICD-10-CM | POA: Diagnosis not present

## 2024-10-21 DIAGNOSIS — E559 Vitamin D deficiency, unspecified: Secondary | ICD-10-CM | POA: Diagnosis not present

## 2024-10-21 DIAGNOSIS — E039 Hypothyroidism, unspecified: Secondary | ICD-10-CM | POA: Diagnosis not present

## 2024-10-21 DIAGNOSIS — I700 Atherosclerosis of aorta: Secondary | ICD-10-CM | POA: Diagnosis not present

## 2024-10-21 DIAGNOSIS — R609 Edema, unspecified: Secondary | ICD-10-CM | POA: Diagnosis not present

## 2024-10-21 DIAGNOSIS — Z23 Encounter for immunization: Secondary | ICD-10-CM | POA: Diagnosis not present

## 2024-10-21 DIAGNOSIS — H6123 Impacted cerumen, bilateral: Secondary | ICD-10-CM | POA: Diagnosis not present

## 2024-10-24 DIAGNOSIS — Z79899 Other long term (current) drug therapy: Secondary | ICD-10-CM | POA: Diagnosis not present

## 2024-10-24 DIAGNOSIS — M3501 Sicca syndrome with keratoconjunctivitis: Secondary | ICD-10-CM | POA: Diagnosis not present

## 2024-10-24 DIAGNOSIS — H0220C Unspecified lagophthalmos, bilateral, upper and lower eyelids: Secondary | ICD-10-CM | POA: Diagnosis not present

## 2024-10-27 DIAGNOSIS — H401132 Primary open-angle glaucoma, bilateral, moderate stage: Secondary | ICD-10-CM | POA: Diagnosis not present

## 2024-10-27 DIAGNOSIS — H401112 Primary open-angle glaucoma, right eye, moderate stage: Secondary | ICD-10-CM | POA: Diagnosis not present
